# Patient Record
Sex: Female | Born: 1945
Health system: Southern US, Community
[De-identification: ages and names within clinical notes are randomized; demographics above are authoritative.]

## PROBLEM LIST (undated history)

## (undated) DIAGNOSIS — I252 Old myocardial infarction: Secondary | ICD-10-CM

## (undated) DIAGNOSIS — K219 Gastro-esophageal reflux disease without esophagitis: Secondary | ICD-10-CM

## (undated) DIAGNOSIS — T4145XA Adverse effect of unspecified anesthetic, initial encounter: Secondary | ICD-10-CM

## (undated) DIAGNOSIS — G5600 Carpal tunnel syndrome, unspecified upper limb: Secondary | ICD-10-CM

## (undated) DIAGNOSIS — F419 Anxiety disorder, unspecified: Secondary | ICD-10-CM

## (undated) DIAGNOSIS — M25559 Pain in unspecified hip: Secondary | ICD-10-CM

## (undated) DIAGNOSIS — E785 Hyperlipidemia, unspecified: Secondary | ICD-10-CM

## (undated) DIAGNOSIS — K222 Esophageal obstruction: Secondary | ICD-10-CM

## (undated) DIAGNOSIS — M1612 Unilateral primary osteoarthritis, left hip: Secondary | ICD-10-CM

## (undated) DIAGNOSIS — L570 Actinic keratosis: Secondary | ICD-10-CM

## (undated) DIAGNOSIS — I739 Peripheral vascular disease, unspecified: Secondary | ICD-10-CM

## (undated) DIAGNOSIS — M1990 Unspecified osteoarthritis, unspecified site: Secondary | ICD-10-CM

## (undated) DIAGNOSIS — M545 Low back pain, unspecified: Secondary | ICD-10-CM

## (undated) DIAGNOSIS — R079 Chest pain, unspecified: Secondary | ICD-10-CM

## (undated) DIAGNOSIS — K5792 Diverticulitis of intestine, part unspecified, without perforation or abscess without bleeding: Secondary | ICD-10-CM

## (undated) DIAGNOSIS — R011 Cardiac murmur, unspecified: Secondary | ICD-10-CM

## (undated) DIAGNOSIS — I10 Essential (primary) hypertension: Secondary | ICD-10-CM

## (undated) DIAGNOSIS — G5701 Lesion of sciatic nerve, right lower limb: Secondary | ICD-10-CM

## (undated) DIAGNOSIS — G8929 Other chronic pain: Secondary | ICD-10-CM

## (undated) DIAGNOSIS — K449 Diaphragmatic hernia without obstruction or gangrene: Secondary | ICD-10-CM

## (undated) DIAGNOSIS — R5383 Other fatigue: Secondary | ICD-10-CM

## (undated) DIAGNOSIS — T8859XA Other complications of anesthesia, initial encounter: Secondary | ICD-10-CM

## (undated) DIAGNOSIS — M1812 Unilateral primary osteoarthritis of first carpometacarpal joint, left hand: Secondary | ICD-10-CM

## (undated) HISTORY — PX: APPENDECTOMY: SHX54

## (undated) HISTORY — DX: Cardiac murmur, unspecified: R01.1

## (undated) HISTORY — DX: Hyperlipidemia, unspecified: E78.5

## (undated) HISTORY — DX: Unilateral primary osteoarthritis of first carpometacarpal joint, left hand: M18.12

## (undated) HISTORY — DX: Actinic keratosis: L57.0

## (undated) HISTORY — DX: Carpal tunnel syndrome, unspecified upper limb: G56.00

## (undated) HISTORY — DX: Pain in unspecified hip: M25.559

## (undated) HISTORY — DX: Diaphragmatic hernia without obstruction or gangrene: K44.9

## (undated) HISTORY — PX: BREAST EXCISIONAL BIOPSY: SUR124

## (undated) HISTORY — DX: Lesion of sciatic nerve, right lower limb: G57.01

## (undated) HISTORY — DX: Low back pain: M54.5

## (undated) HISTORY — DX: Essential (primary) hypertension: I10

## (undated) HISTORY — DX: Unilateral primary osteoarthritis, left hip: M16.12

## (undated) HISTORY — DX: Chest pain, unspecified: R07.9

## (undated) HISTORY — PX: JOINT REPLACEMENT: SHX530

## (undated) HISTORY — DX: Other fatigue: R53.83

## (undated) HISTORY — PX: OTHER SURGICAL HISTORY: SHX169

## (undated) HISTORY — DX: Unspecified osteoarthritis, unspecified site: M19.90

## (undated) HISTORY — DX: Other chronic pain: G89.29

## (undated) HISTORY — DX: Diverticulitis of intestine, part unspecified, without perforation or abscess without bleeding: K57.92

## (undated) HISTORY — DX: Low back pain, unspecified: M54.50

---

## 1980-04-06 HISTORY — PX: ABDOMINAL HYSTERECTOMY: SHX81

## 1990-05-16 HISTORY — PX: ESOPHAGOGASTRODUODENOSCOPY: SHX1529

## 1998-02-04 ENCOUNTER — Ambulatory Visit (HOSPITAL_COMMUNITY): Admission: RE | Admit: 1998-02-04 | Discharge: 1998-02-04 | Payer: Self-pay | Admitting: Gynecology

## 1998-02-05 ENCOUNTER — Ambulatory Visit (HOSPITAL_COMMUNITY): Admission: RE | Admit: 1998-02-05 | Discharge: 1998-02-05 | Payer: Self-pay | Admitting: Gynecology

## 1998-10-31 HISTORY — PX: CARDIOVASCULAR STRESS TEST: SHX262

## 1999-07-31 ENCOUNTER — Ambulatory Visit (HOSPITAL_COMMUNITY): Admission: RE | Admit: 1999-07-31 | Discharge: 1999-07-31 | Payer: Self-pay | Admitting: Gynecology

## 1999-07-31 ENCOUNTER — Encounter: Payer: Self-pay | Admitting: Gynecology

## 1999-08-14 ENCOUNTER — Other Ambulatory Visit: Admission: RE | Admit: 1999-08-14 | Discharge: 1999-08-14 | Payer: Self-pay | Admitting: Gynecology

## 2000-07-29 ENCOUNTER — Encounter: Payer: Self-pay | Admitting: Gynecology

## 2000-07-29 ENCOUNTER — Encounter: Admission: RE | Admit: 2000-07-29 | Discharge: 2000-07-29 | Payer: Self-pay | Admitting: Gynecology

## 2002-08-10 ENCOUNTER — Encounter: Payer: Self-pay | Admitting: Gynecology

## 2002-08-10 ENCOUNTER — Encounter: Admission: RE | Admit: 2002-08-10 | Discharge: 2002-08-10 | Payer: Self-pay | Admitting: Gynecology

## 2003-05-15 ENCOUNTER — Other Ambulatory Visit: Admission: RE | Admit: 2003-05-15 | Discharge: 2003-05-15 | Payer: Self-pay | Admitting: Gynecology

## 2004-05-15 ENCOUNTER — Encounter: Admission: RE | Admit: 2004-05-15 | Discharge: 2004-05-15 | Payer: Self-pay | Admitting: Gynecology

## 2005-11-18 ENCOUNTER — Encounter: Admission: RE | Admit: 2005-11-18 | Discharge: 2005-11-18 | Payer: Self-pay | Admitting: Gynecology

## 2005-12-01 ENCOUNTER — Encounter: Admission: RE | Admit: 2005-12-01 | Discharge: 2005-12-01 | Payer: Self-pay | Admitting: Gynecology

## 2006-08-11 HISTORY — PX: CARDIOVASCULAR STRESS TEST: SHX262

## 2007-01-11 ENCOUNTER — Ambulatory Visit: Payer: Self-pay

## 2007-03-10 ENCOUNTER — Ambulatory Visit (HOSPITAL_COMMUNITY): Admission: RE | Admit: 2007-03-10 | Discharge: 2007-03-10 | Payer: Self-pay | Admitting: Neurosurgery

## 2007-06-16 ENCOUNTER — Encounter: Admission: RE | Admit: 2007-06-16 | Discharge: 2007-06-16 | Payer: Self-pay | Admitting: Cardiology

## 2007-06-28 ENCOUNTER — Encounter: Admission: RE | Admit: 2007-06-28 | Discharge: 2007-06-28 | Payer: Self-pay | Admitting: Gynecology

## 2007-10-05 ENCOUNTER — Ambulatory Visit (HOSPITAL_COMMUNITY): Admission: RE | Admit: 2007-10-05 | Discharge: 2007-10-05 | Payer: Self-pay | Admitting: Neurosurgery

## 2009-02-06 ENCOUNTER — Encounter: Admission: RE | Admit: 2009-02-06 | Discharge: 2009-02-06 | Payer: Self-pay | Admitting: Cardiology

## 2009-02-19 ENCOUNTER — Encounter: Admission: RE | Admit: 2009-02-19 | Discharge: 2009-02-19 | Payer: Self-pay | Admitting: Gynecology

## 2010-06-12 ENCOUNTER — Ambulatory Visit: Payer: Self-pay

## 2010-08-19 NOTE — Op Note (Signed)
NAME:  Tamara Fleming, Tamara Fleming              ACCOUNT NO.:  [id number]   MEDICAL RECORD NO.:  [id number]          PATIENT TYPE:  AMB   LOCATION:  SDS                          FACILITY:  [facility]   PHYSICIAN:  Danae Orleans. Venetia Maxon, M.D.  DATE OF BIRTH:  Sep 21, 1945   DATE OF PROCEDURE:  03/10/2007  DATE OF DISCHARGE:                               OPERATIVE REPORT   PREOPERATIVE DIAGNOSIS:  Left ulnar neuropathy.   POSTOPERATIVE DIAGNOSIS:  Left ulnar neuropathy.   PROCEDURE:  Left ulnar nerve decompression.   SURGEON:  Danae Orleans. Venetia Maxon, M.D.   ASSISTANT:  Georgiann Cocker, RN   ANESTHESIA:  General LMA.   FINDINGS:  Band of compressive scar tissue proximal to the medial  epicondyle was compressing the ulnar nerve and there was decreased  conduction distal and more robust conduction proximally.   ESTIMATED BLOOD LOSS:  Minimal.   COMPLICATIONS:  None.   DISPOSITION:  Recovery.   INDICATIONS:  Tamara Fleming is a 65 year old woman who has had profound  left ulnar neuropathies.  She had an EMG nerve conduction velocities  which demonstrated conduction block just proximal to the elbow.  It was  elected to take her surgery for ulnar nerve decompression.   PROCEDURE:  Tamara Fleming was brought to the operating room.  Following  satisfactory uncomplicated induction of general laryngeal mask LMA  anesthesia the patient was placed in supine position on the operating  table.  Her left hand, forearm and arm were then prepped with Betadine  scrub and paint and sterile stockinette.  The medial epicondyle on the  left was identified.  A lazy omega incision was planned centered on the  epicondyle.  The skin was infiltrated and subcutaneous tissues were  infiltrated 1% local lidocaine.  Incision was carried through  subcutaneous fat to the level of the medial condyle and using sharp  dissection under loupe magnification with scissors.  The left ulnar  nerve was identified.  This was then freed distally and  proximally.  There was a band of scar tissue overlying the ulnar nerve just proximal  to the medial epicondyle.  Using nerve stimulation there was robust  stimulation proximal to this band of tissue and significantly diminished  but still intact stimulation distal to the band of tissue.  Band tissue  was cut and the ulnar nerve was decompressed as it emerged from the  intermuscular septum all the way to the two heads of the flexor carpi  ulnaris where small area of muscle was incised to further decompress the  nerve.  Hemostasis assured with bipolar electrocautery and the wound was  irrigated with bacitracin saline.  With flexion and extension of the  elbow there did not appear to be subluxing of the ulnar nerve.  The soft  tissue then loosely reapproximated deep and then the subcutaneous  tissues were approximated with 2-0 Vicryl interrupted inverted sutures  and skin edges were approximated interrupted 3-0 Vicryl subcu stitch.  The wound was dressed with  Benzoin, Steri-Strips, Telfa gauze, gauze fluff Kerlix wrap.  The  patient was extubated in the operating room, taken to recovery room  in  stable satisfactory condition having tolerated the procedure well, all  counts correct at end the case.      Danae Orleans. Venetia Maxon, M.D.  Electronically Signed     JDS/MEDQ  D:  03/10/2007  T:  03/10/2007  Job:  [id number]

## 2010-08-29 ENCOUNTER — Other Ambulatory Visit: Payer: Self-pay | Admitting: Cardiology

## 2010-08-29 DIAGNOSIS — I10 Essential (primary) hypertension: Secondary | ICD-10-CM

## 2010-08-29 DIAGNOSIS — E785 Hyperlipidemia, unspecified: Secondary | ICD-10-CM

## 2010-08-29 DIAGNOSIS — G4700 Insomnia, unspecified: Secondary | ICD-10-CM

## 2010-08-29 MED ORDER — ATORVASTATIN CALCIUM 10 MG PO TABS: 10.0000 mg | ORAL_TABLET | Freq: Every day | ORAL | Status: DC

## 2010-08-29 MED ORDER — VALSARTAN-HYDROCHLOROTHIAZIDE 80-12.5 MG PO TABS: 1.0000 | ORAL_TABLET | Freq: Every day | ORAL | Status: DC

## 2010-08-29 MED ORDER — ALPRAZOLAM 0.25 MG PO TABS: 0.2500 mg | ORAL_TABLET | Freq: Every evening | ORAL | Status: AC | PRN

## 2010-08-29 NOTE — Telephone Encounter (Signed)
Wanted to know if she can get refills on her meds. She called today to make her 41mo f/u appt but could not get in until 10/20/10. She will be running out of her meds.before then.

## 2010-08-29 NOTE — Telephone Encounter (Signed)
Requested rx, called in

## 2010-10-13 ENCOUNTER — Encounter: Payer: Self-pay | Admitting: Cardiology

## 2010-10-20 ENCOUNTER — Encounter: Payer: Self-pay | Admitting: Cardiology

## 2010-10-20 ENCOUNTER — Ambulatory Visit (INDEPENDENT_AMBULATORY_CARE_PROVIDER_SITE_OTHER): Payer: BC Managed Care – PPO | Admitting: Cardiology

## 2010-10-20 DIAGNOSIS — R079 Chest pain, unspecified: Secondary | ICD-10-CM

## 2010-10-20 DIAGNOSIS — R52 Pain, unspecified: Secondary | ICD-10-CM

## 2010-10-20 DIAGNOSIS — R5381 Other malaise: Secondary | ICD-10-CM

## 2010-10-20 DIAGNOSIS — E785 Hyperlipidemia, unspecified: Secondary | ICD-10-CM

## 2010-10-20 DIAGNOSIS — R5383 Other fatigue: Secondary | ICD-10-CM

## 2010-10-20 DIAGNOSIS — M1990 Unspecified osteoarthritis, unspecified site: Secondary | ICD-10-CM

## 2010-10-20 DIAGNOSIS — E7800 Pure hypercholesterolemia, unspecified: Secondary | ICD-10-CM

## 2010-10-20 DIAGNOSIS — M25552 Pain in left hip: Secondary | ICD-10-CM | POA: Insufficient documentation

## 2010-10-20 HISTORY — DX: Chest pain, unspecified: R07.9

## 2010-10-20 MED ORDER — HYDROCODONE-ACETAMINOPHEN 5-500 MG PO TABS: 1.0000 | ORAL_TABLET | Freq: Four times a day (QID) | ORAL | Status: DC | PRN

## 2010-10-20 NOTE — Assessment & Plan Note (Addendum)
The patient has had osteoarthritis and bursitis in her left hip.  She has required several injections of steroids into the hip.  She attributes some of her recent weight gain to the fact that she has had steroid shots.Her weight is up 8 pounds since we last saw her a year ago

## 2010-10-20 NOTE — Assessment & Plan Note (Signed)
The patient has a history of atypical chest pain.  Last nuclear stress test was 06/23/07 which time she exercised for 9 minutes and there was nothing to suggest ischemia by standard criteria and her ejection fraction was 75%.  Her last echocardiogram was in 2008 and at that time showed normal left ventricular systolic and diastolic function and no significant valve abnormalities.

## 2010-10-20 NOTE — Assessment & Plan Note (Signed)
The patient has a history of dyslipidemia.  We had not checked her blood work in more than a year.  She is concerned that with her weight gain her lipids may be worse.  We will check him today.  She remains on low dose Lipitor.

## 2010-10-20 NOTE — Assessment & Plan Note (Signed)
The patient has been experiencing fatigue.  She complains of lack of energy.  She has not had any evidence of hematochezia or melena.  She wonders if she might be having some menopausal symptoms since she is no longer on estrogen replacement therapy.  She does not have any history of thyroid disorder and we are checking blood work today

## 2010-10-20 NOTE — Progress Notes (Signed)
Tamara Fleming Date of Birth:  [birth year] [facility] [address]   [address] [place], [place]  [address] [phone]           Fax   [phone]  History of Present Illness: This 65 year old woman is seen for a one-year followup office visit.  She has a past history of atypical chest pain and history of dyslipidemia.  Her family history is positive for coronary artery disease.  She had a Cardiolite stress test in 06/23/07 that was normal and she had an echocardiogram on5/07/08 which showed normal left ventricular systolic and diastolic function and no valvular abnormalities.  Since last visit she's had a pan week HEENT.  She attributes some of this to the side effects of steroids and she has received 4% as of her left hip.  She's also concerned about possible hormonal issues.  She is postmenopausal and had taken estrogen replacement therapy for 9 years but is not any longer on estrogen replacement therapy.  She will discuss her concerns with her gynecologist.  Recently she's had some shortness of breath and some atypical chest pressure.  She complains of extreme fatigue and doesn't have much energy  Current Outpatient Prescriptions  Medication Sig Dispense Refill  . ALPRAZolam (XANAX) 0.25 MG tablet Take 0.25 mg by mouth at bedtime as needed.        Marland Kitchen aspirin 81 MG tablet Take 81 mg by mouth every other day.        Marland Kitchen atorvastatin (LIPITOR) 10 MG tablet Take 1 tablet (10 mg total) by mouth daily.  90 tablet  1  . HYDROcodone-acetaminophen (VICODIN) 5-500 MG per tablet Take 1 tablet by mouth every 6 (six) hours as needed.  90 tablet  3  . valsartan-hydrochlorothiazide (DIOVAN HCT) 80-12.5 MG per tablet Take 1 tablet by mouth daily.  90 tablet  1    Allergies  Allergen Reactions  . Floxin (Ofloxacin)     Patient Active Problem List  Diagnoses  . Chest pain  . Dyslipidemia  . Osteoarthritis  . Fatigue    History  Smoking status  . Never Smoker     Smokeless tobacco  . Not on file    History  Alcohol Use No    Family History  Problem Relation Age of Onset  . Coronary artery disease Mother     Review of Systems: Constitutional: no fever chills diaphoresis or fatigue or change in weight.  Head and neck: no hearing loss, no epistaxis, no photophobia or visual disturbance. Respiratory: No cough, shortness of breath or wheezing. Cardiovascular: No chest pain peripheral edema, palpitations. Gastrointestinal: No abdominal distention, no abdominal pain, no change in bowel habits hematochezia or melena. Genitourinary: No dysuria, no frequency, no urgency, no nocturia. Musculoskeletal:No arthralgias, no back pain, no gait disturbance or myalgias. Neurological: No dizziness, no headaches, no numbness, no seizures, no syncope, no weakness, no tremors. Hematologic: No lymphadenopathy, no easy bruising. Psychiatric: No confusion, no hallucinations, no sleep disturbance.    Physical Exam: Filed Vitals:   10/20/10 1534  BP: 130/80  Pulse: 66  The general appearance reveals a well-developed well-nourished woman in no distress.Pupils equal and reactive.   Extraocular Movements are full.  There is no scleral icterus.  The mouth and pharynx are normal.  The neck is supple.  The carotids reveal no bruits.  The jugular venous pressure is normal.  The thyroid is not enlarged.  There is no lymphadenopathy.The chest is clear to percussion and auscultation.  There are no rales or rhonchi. Expansion of the chest is symmetrical.  The heart reveals a soft systolic ejection murmur at the base.  No diastolic murmur.  No  gallop or rub.The abdomen is soft and nontender. Bowel sounds are normal. The liver and spleen are not enlarged. There Are no abdominal masses. There are no bruits.The pedal pulses are good.  There is no phlebitis or edema.  There is no cyanosis or clubbing.Strength is normal and symmetrical in all extremities.  There is no lateralizing  weakness.  There are no sensory deficits.The skin is warm and dry.  There is no rash.   Assessment / Plan:  We are checking lab work today to evaluate her lipids as well as her complaints of fatigue and malaise.  We'll also update her echocardiogram.  Her systolic murmur maybe a little more prominent than in the past.  Recheck in 6 months for followup office visit And fasting lab work

## 2010-10-21 LAB — HEPATIC FUNCTION PANEL
ALT: 34 U/L (ref 0–35)
Albumin: 4.6 g/dL (ref 3.5–5.2)
Alkaline Phosphatase: 79 U/L (ref 39–117)
Bilirubin, Direct: 0.1 mg/dL (ref 0.0–0.3)
Total Protein: 7.4 g/dL (ref 6.0–8.3)

## 2010-10-21 LAB — LIPID PANEL
Cholesterol: 154 mg/dL (ref 0–200)
LDL Cholesterol: 81 mg/dL (ref 0–99)
Triglycerides: 103 mg/dL (ref 0.0–149.0)
VLDL: 20.6 mg/dL (ref 0.0–40.0)

## 2010-10-21 LAB — CBC WITH DIFFERENTIAL/PLATELET
Basophils Absolute: 0 10*3/uL (ref 0.0–0.1)
Eosinophils Relative: 1.7 % (ref 0.0–5.0)
HCT: 37.7 % (ref 36.0–46.0)
Lymphs Abs: 2.4 10*3/uL (ref 0.7–4.0)
Monocytes Absolute: 0.8 10*3/uL (ref 0.1–1.0)
Monocytes Relative: 10 % (ref 3.0–12.0)
Neutrophils Relative %: 55.7 % (ref 43.0–77.0)
Platelets: 213 10*3/uL (ref 150.0–400.0)
RDW: 13.1 % (ref 11.5–14.6)
WBC: 7.5 10*3/uL (ref 4.5–10.5)

## 2010-10-21 LAB — BASIC METABOLIC PANEL
BUN: 10 mg/dL (ref 6–23)
CO2: 29 mEq/L (ref 19–32)
Glucose, Bld: 111 mg/dL — ABNORMAL HIGH (ref 70–99)
Potassium: 4.6 mEq/L (ref 3.5–5.1)
Sodium: 140 mEq/L (ref 135–145)

## 2010-10-22 ENCOUNTER — Encounter: Payer: Self-pay | Admitting: *Deleted

## 2010-10-23 ENCOUNTER — Telehealth: Payer: Self-pay | Admitting: *Deleted

## 2010-10-23 NOTE — Telephone Encounter (Signed)
Advised patient of lab results  

## 2010-10-28 ENCOUNTER — Other Ambulatory Visit (HOSPITAL_COMMUNITY): Payer: BC Managed Care – PPO | Admitting: Radiology

## 2010-11-04 ENCOUNTER — Ambulatory Visit (HOSPITAL_COMMUNITY): Payer: BC Managed Care – PPO | Attending: Cardiology | Admitting: Radiology

## 2010-11-04 DIAGNOSIS — R5381 Other malaise: Secondary | ICD-10-CM | POA: Insufficient documentation

## 2010-11-04 DIAGNOSIS — R011 Cardiac murmur, unspecified: Secondary | ICD-10-CM

## 2010-11-04 DIAGNOSIS — E785 Hyperlipidemia, unspecified: Secondary | ICD-10-CM | POA: Insufficient documentation

## 2010-11-04 DIAGNOSIS — R072 Precordial pain: Secondary | ICD-10-CM

## 2010-11-04 DIAGNOSIS — Z8249 Family history of ischemic heart disease and other diseases of the circulatory system: Secondary | ICD-10-CM | POA: Insufficient documentation

## 2010-11-04 DIAGNOSIS — R5383 Other fatigue: Secondary | ICD-10-CM

## 2010-11-06 ENCOUNTER — Telehealth: Payer: Self-pay | Admitting: *Deleted

## 2010-11-06 NOTE — Telephone Encounter (Signed)
Message copied by Eugenia Pancoast on Thu Nov 06, 2010  2:49 PM ------      Message from: Cassell Clement      Created: Wed Nov 05, 2010  3:34 PM       Please report.  The echocardiogram was very good.  No significant valve problem was found.  There is a trivial leak of her aortic valve but no aortic stenosis.  Continue on same medication and continue with efforts to keep cholesterol down

## 2010-11-07 ENCOUNTER — Ambulatory Visit: Payer: Self-pay | Admitting: Family Medicine

## 2010-11-09 ENCOUNTER — Inpatient Hospital Stay: Payer: Self-pay | Admitting: Surgery

## 2010-12-13 ENCOUNTER — Other Ambulatory Visit: Payer: Self-pay | Admitting: Cardiology

## 2010-12-18 ENCOUNTER — Ambulatory Visit: Payer: Self-pay | Admitting: Surgery

## 2011-01-12 LAB — BASIC METABOLIC PANEL
BUN: 7
Calcium: 9.6
Creatinine, Ser: 0.71
GFR calc non Af Amer: >60
Glucose, Bld: 106 — ABNORMAL HIGH

## 2011-01-12 LAB — CBC
HCT: 37.4
Platelets: 247
RDW: 12.8
WBC: 7

## 2011-01-15 ENCOUNTER — Other Ambulatory Visit: Payer: Self-pay | Admitting: Cardiology

## 2011-01-15 DIAGNOSIS — M549 Dorsalgia, unspecified: Secondary | ICD-10-CM

## 2011-02-06 ENCOUNTER — Other Ambulatory Visit: Payer: Self-pay | Admitting: Cardiology

## 2011-02-06 NOTE — Telephone Encounter (Signed)
Refilled lipitor 

## 2011-03-06 ENCOUNTER — Other Ambulatory Visit: Payer: Self-pay | Admitting: Cardiology

## 2011-03-06 ENCOUNTER — Telehealth: Payer: Self-pay | Admitting: Cardiology

## 2011-03-06 DIAGNOSIS — I119 Hypertensive heart disease without heart failure: Secondary | ICD-10-CM

## 2011-03-06 DIAGNOSIS — M549 Dorsalgia, unspecified: Secondary | ICD-10-CM

## 2011-03-06 NOTE — Telephone Encounter (Signed)
Left message

## 2011-03-06 NOTE — Telephone Encounter (Signed)
New message:  Pt wants to know if there is something else she can take in place of Diovan that would be a generic because of insurance. Please call her back at [phone] or [phone]

## 2011-03-09 MED ORDER — VALSARTAN-HYDROCHLOROTHIAZIDE 80-12.5 MG PO TABS: 1.0000 | ORAL_TABLET | Freq: Every day | ORAL | Status: DC

## 2011-03-09 NOTE — Telephone Encounter (Signed)
Advised available in generic and called to pharmacy

## 2011-03-09 NOTE — Telephone Encounter (Signed)
FU Call: Pt returning call from Melinda. Please call back.  

## 2011-03-10 NOTE — Telephone Encounter (Signed)
Called pharmacy and no refill needed at this time per pharmacy

## 2011-04-15 ENCOUNTER — Other Ambulatory Visit: Payer: Self-pay | Admitting: Gynecology

## 2011-04-15 DIAGNOSIS — Z1231 Encounter for screening mammogram for malignant neoplasm of breast: Secondary | ICD-10-CM

## 2011-04-22 ENCOUNTER — Ambulatory Visit (INDEPENDENT_AMBULATORY_CARE_PROVIDER_SITE_OTHER): Payer: BC Managed Care – PPO | Admitting: Cardiology

## 2011-04-22 ENCOUNTER — Encounter: Payer: Self-pay | Admitting: Cardiology

## 2011-04-22 VITALS — BP 120/78 | HR 60 | Ht 65.0 in | Wt 173.0 lb

## 2011-04-22 DIAGNOSIS — E7800 Pure hypercholesterolemia, unspecified: Secondary | ICD-10-CM

## 2011-04-22 DIAGNOSIS — F411 Generalized anxiety disorder: Secondary | ICD-10-CM

## 2011-04-22 DIAGNOSIS — E785 Hyperlipidemia, unspecified: Secondary | ICD-10-CM

## 2011-04-22 DIAGNOSIS — R5383 Other fatigue: Secondary | ICD-10-CM

## 2011-04-22 DIAGNOSIS — Z1231 Encounter for screening mammogram for malignant neoplasm of breast: Secondary | ICD-10-CM

## 2011-04-22 DIAGNOSIS — R079 Chest pain, unspecified: Secondary | ICD-10-CM

## 2011-04-22 DIAGNOSIS — I119 Hypertensive heart disease without heart failure: Secondary | ICD-10-CM

## 2011-04-22 DIAGNOSIS — F419 Anxiety disorder, unspecified: Secondary | ICD-10-CM

## 2011-04-22 LAB — HEPATIC FUNCTION PANEL
ALT: 31 U/L (ref 0–35)
Alkaline Phosphatase: 68 U/L (ref 39–117)
Bilirubin, Direct: 0.1 mg/dL (ref 0.0–0.3)
Total Bilirubin: 0.9 mg/dL (ref 0.3–1.2)

## 2011-04-22 LAB — LDL CHOLESTEROL, DIRECT: Direct LDL: 125 mg/dL

## 2011-04-22 LAB — BASIC METABOLIC PANEL
Chloride: 103 mEq/L (ref 96–112)
Creatinine, Ser: 0.8 mg/dL (ref 0.4–1.2)
Potassium: 3.3 mEq/L — ABNORMAL LOW (ref 3.5–5.1)
Sodium: 142 mEq/L (ref 135–145)

## 2011-04-22 LAB — LIPID PANEL
Total CHOL/HDL Ratio: 3
Triglycerides: 122 mg/dL (ref 0.0–149.0)

## 2011-04-22 MED ORDER — ALPRAZOLAM 0.25 MG PO TABS: 0.2500 mg | ORAL_TABLET | Freq: Every evening | ORAL | Status: DC | PRN

## 2011-04-22 MED ORDER — VALSARTAN-HYDROCHLOROTHIAZIDE 80-12.5 MG PO TABS: 1.0000 | ORAL_TABLET | Freq: Every day | ORAL | Status: DC

## 2011-04-22 MED ORDER — ATORVASTATIN CALCIUM 10 MG PO TABS: 10.0000 mg | ORAL_TABLET | Freq: Every day | ORAL | Status: DC

## 2011-04-22 NOTE — Progress Notes (Signed)
Tamara Fleming Date of Birth:  [birth year] [facility] [address] [place], [place]  [address] [phone]  Fax   [phone]  HPI: This pleasant 66 year old woman is seen back for a scheduled followup office visit.  She has a history of hypercholesterolemia and atypical chest pain.  Since the last saw her she was hospitalized in [place] for a week with severe diverticulitis.  She has not had any recent cardiac symptoms.  She does note her heart racing at night intermittently.  Current Outpatient Prescriptions  Medication Sig Dispense Refill  . ALPRAZolam (XANAX) 0.25 MG tablet Take 0.25 mg by mouth at bedtime as needed.        Marland Kitchen aspirin 81 MG tablet Take 81 mg by mouth daily.       Marland Kitchen atorvastatin (LIPITOR) 10 MG tablet TAKE 1 TABLET ONCE DAILY FOR CHOLESTEROL  90 tablet  3  . HYDROcodone-acetaminophen (VICODIN) 5-500 MG per tablet TAKE ONE TABLET BY MOUTH EVERY SIX HOURSAS NEEDED FOR PAIN.  90 tablet  0  . valsartan-hydrochlorothiazide (DIOVAN HCT) 80-12.5 MG per tablet Take 1 tablet by mouth daily.  90 tablet  3    Allergies  Allergen Reactions  . Ciprofloxacin   . Floxin (Ofloxacin)     Patient Active Problem List  Diagnoses  . Chest pain  . Dyslipidemia  . Osteoarthritis  . Fatigue    History  Smoking status  . Never Smoker   Smokeless tobacco  . Not on file    History  Alcohol Use No    Family History  Problem Relation Age of Onset  . Coronary artery disease Mother     Review of Systems: The patient denies any heat or cold intolerance.  No weight gain or weight loss.  The patient denies headaches or blurry vision.  There is no cough or sputum production.  The patient denies dizziness.  There is no hematuria or hematochezia.  The patient denies any muscle aches or arthritis.  The patient denies any rash.  The patient denies frequent falling or instability.  There is no history of depression or anxiety.  All other systems were  reviewed and are negative.   Physical Exam: Filed Vitals:   04/22/11 1034  BP: 120/78  Pulse: 60   the general appearance reveals a well-developed well-nourished Caucasian female in no distress.The head and neck exam reveals pupils equal and reactive.  Extraocular movements are full.  There is no scleral icterus.  The mouth and pharynx are normal.  The neck is supple.  The carotids reveal no bruits.  The jugular venous pressure is normal.  The  thyroid is not enlarged.  There is no lymphadenopathy.  The chest is clear to percussion and auscultation.  There are no rales or rhonchi.  Expansion of the chest is symmetrical.  The precordium is quiet.  The first heart sound is normal.  The second heart sound is physiologically split.  There is no murmur gallop rub or click.  There is no abnormal lift or heave.  The abdomen is soft and nontender.  The bowel sounds are normal.  The liver and spleen are not enlarged.  There are no abdominal masses.  There are no abdominal bruits.  Extremities reveal good pedal pulses.  There is no phlebitis or edema.  There is no cyanosis or clubbing.  Strength is normal and symmetrical in all extremities.  There is no lateralizing weakness.  There are no sensory deficits.  The skin is warm  and dry.  There is no rash.      Assessment / Plan: Continue present medication.  Await results of today's labs.  We refilled her medication.  Recheck in 6 months for followup office visit EKG and fasting labs

## 2011-04-22 NOTE — Assessment & Plan Note (Signed)
The patient does have a history of chronic fatigue.  She is looking forward to a planned cruise to the [place] in several weeks to get her strength back.

## 2011-04-22 NOTE — Patient Instructions (Signed)
Will obtain labs today and call you with the results  Your physician recommends that you continue on your current medications as directed. Please refer to the Current Medication list given to you today.  Your physician wants you to follow-up in: 6 month You will receive a reminder letter in the mail two months in advance. If you don't receive a letter, please call our office to schedule the follow-up appointment.  

## 2011-04-22 NOTE — Assessment & Plan Note (Signed)
Patient has a history of dyslipidemia.  She has been on low-dose Lipitor without side effects.  Her blood work today is pending.  His last visit she has been exercising more and her weight is down 11 pounds which should help her lipid status

## 2011-04-22 NOTE — Assessment & Plan Note (Signed)
Since last visit the patient has not had any severe or prolonged chest pain.

## 2011-05-06 ENCOUNTER — Ambulatory Visit
Admission: RE | Admit: 2011-05-06 | Discharge: 2011-05-06 | Disposition: A | Discharge status: Home / Self Care | Payer: BC Managed Care – PPO | Source: Ambulatory Visit | Attending: Gynecology | Admitting: Gynecology

## 2011-05-07 ENCOUNTER — Telehealth: Payer: Self-pay | Admitting: *Deleted

## 2011-05-07 DIAGNOSIS — E876 Hypokalemia: Secondary | ICD-10-CM

## 2011-05-07 DIAGNOSIS — G723 Periodic paralysis: Secondary | ICD-10-CM

## 2011-05-07 MED ORDER — POTASSIUM CHLORIDE CRYS ER 20 MEQ PO TBCR: 20.0000 meq | EXTENDED_RELEASE_TABLET | Freq: Every day | ORAL | Status: DC | PRN

## 2011-05-07 NOTE — Telephone Encounter (Signed)
Advised of labs and new medication  

## 2011-05-07 NOTE — Telephone Encounter (Signed)
Message copied by Burnell Blanks on Thu May 07, 2011  5:31 PM ------      Message from: Cassell Clement      Created: Thu Apr 23, 2011  7:38 AM       Please report.  The labs are stable.    Continue careful diet.  The K is low  So add KDUR 20 one tab daily.  LDL higher so watch diet carefully.

## 2011-05-08 ENCOUNTER — Other Ambulatory Visit: Payer: Self-pay | Admitting: Cardiology

## 2011-05-08 DIAGNOSIS — E876 Hypokalemia: Secondary | ICD-10-CM

## 2011-05-08 MED ORDER — POTASSIUM CHLORIDE CRYS ER 20 MEQ PO TBCR: 20.0000 meq | EXTENDED_RELEASE_TABLET | Freq: Every day | ORAL | Status: DC | PRN

## 2011-05-08 NOTE — Telephone Encounter (Signed)
New Refill  Ed Pharmacist, [facility] [phone] Fax [phone]  Calling for patient refill on potassium as he did not receive anything for patient yesterday

## 2011-05-08 NOTE — Telephone Encounter (Signed)
Refilled potassium spoke with Tresa Endo at [facility]

## 2011-06-04 ENCOUNTER — Telehealth: Payer: Self-pay | Admitting: Cardiology

## 2011-06-04 NOTE — Telephone Encounter (Signed)
Pt needs refill vicodin, [facility] pt [phone]

## 2011-06-08 NOTE — Telephone Encounter (Signed)
We have asked her to get this from her primary care physician

## 2011-06-09 NOTE — Telephone Encounter (Signed)
Spoke with patient and she rarely sees her PCP and wanted to know if  Dr. Patty Sermons  could fill this time until she gets worked out with PCP.  Will forward to  Dr. Patty Sermons for review

## 2011-06-09 NOTE — Telephone Encounter (Signed)
Okay to fill it this time.

## 2011-06-10 ENCOUNTER — Other Ambulatory Visit: Payer: Self-pay | Admitting: *Deleted

## 2011-06-10 DIAGNOSIS — M549 Dorsalgia, unspecified: Secondary | ICD-10-CM

## 2011-06-10 DIAGNOSIS — E876 Hypokalemia: Secondary | ICD-10-CM

## 2011-06-10 MED ORDER — HYDROCODONE-ACETAMINOPHEN 5-500 MG PO TABS: 1.0000 | ORAL_TABLET | Freq: Four times a day (QID) | ORAL | Status: DC | PRN

## 2011-06-10 NOTE — Telephone Encounter (Signed)
Advised patient

## 2011-06-10 NOTE — Telephone Encounter (Signed)
In regard to the potassium pills, she can try to increase her potassium by ingesting potassium rich foods. In regard to her leg complaint I would suggest that she go to an urgent care if she does not have a family doctor.

## 2011-06-10 NOTE — Telephone Encounter (Signed)
When called patient about Rx, she stated she couldn't take her potassium that had been Rx'd secondary to not being able to swallow.  She did try to cut in half and this didn't work.  She also was c/o place on her leg near her shin. The place is sore to touch and burns. It has been going on for some time, but place is now numb feeling.  Will forward to tab for review

## 2011-07-01 ENCOUNTER — Ambulatory Visit (INDEPENDENT_AMBULATORY_CARE_PROVIDER_SITE_OTHER): Payer: Medicare Other | Admitting: *Deleted

## 2011-07-01 DIAGNOSIS — E785 Hyperlipidemia, unspecified: Secondary | ICD-10-CM | POA: Diagnosis not present

## 2011-07-01 DIAGNOSIS — E876 Hypokalemia: Secondary | ICD-10-CM | POA: Diagnosis not present

## 2011-07-01 DIAGNOSIS — M1990 Unspecified osteoarthritis, unspecified site: Secondary | ICD-10-CM

## 2011-07-01 DIAGNOSIS — R079 Chest pain, unspecified: Secondary | ICD-10-CM

## 2011-07-01 LAB — URIC ACID: Uric Acid, Serum: 9.1 mg/dL — ABNORMAL HIGH (ref 2.4–7.0)

## 2011-07-01 LAB — BASIC METABOLIC PANEL
Calcium: 9.3 mg/dL (ref 8.4–10.5)
GFR: 86.38 mL/min (ref >60.00)
Potassium: 3.7 mEq/L (ref 3.5–5.1)
Sodium: 137 mEq/L (ref 135–145)

## 2011-07-01 NOTE — Progress Notes (Signed)
Quick Note:  Please report to patient. The recent labs are stable. Continue same medication and careful diet. Potassium is normal now. Uric acid is high. Has she been having any symptoms of gout? ______

## 2011-07-10 ENCOUNTER — Telehealth: Payer: Self-pay | Admitting: Cardiology

## 2011-07-10 NOTE — Telephone Encounter (Signed)
New [place]:     Patient called in wanting to know the results of her latest blood test.  Please call back.

## 2011-07-10 NOTE — Telephone Encounter (Signed)
Patient aware of lab results.

## 2011-08-11 DIAGNOSIS — J029 Acute pharyngitis, unspecified: Secondary | ICD-10-CM | POA: Diagnosis not present

## 2011-08-27 ENCOUNTER — Other Ambulatory Visit: Payer: Self-pay | Admitting: Cardiology

## 2011-08-27 DIAGNOSIS — R5383 Other fatigue: Secondary | ICD-10-CM

## 2011-08-27 DIAGNOSIS — F419 Anxiety disorder, unspecified: Secondary | ICD-10-CM

## 2011-08-27 DIAGNOSIS — M549 Dorsalgia, unspecified: Secondary | ICD-10-CM

## 2011-08-27 DIAGNOSIS — E7800 Pure hypercholesterolemia, unspecified: Secondary | ICD-10-CM

## 2011-08-27 DIAGNOSIS — E785 Hyperlipidemia, unspecified: Secondary | ICD-10-CM

## 2011-08-27 DIAGNOSIS — Z1231 Encounter for screening mammogram for malignant neoplasm of breast: Secondary | ICD-10-CM

## 2011-08-27 DIAGNOSIS — I119 Hypertensive heart disease without heart failure: Secondary | ICD-10-CM

## 2011-08-27 NOTE — Telephone Encounter (Signed)
Per pt call she needs these refills in asap she is out of meds

## 2011-08-28 ENCOUNTER — Other Ambulatory Visit: Payer: Self-pay | Admitting: *Deleted

## 2011-08-28 DIAGNOSIS — R5383 Other fatigue: Secondary | ICD-10-CM

## 2011-08-28 DIAGNOSIS — Z1231 Encounter for screening mammogram for malignant neoplasm of breast: Secondary | ICD-10-CM

## 2011-08-28 DIAGNOSIS — I119 Hypertensive heart disease without heart failure: Secondary | ICD-10-CM

## 2011-08-28 DIAGNOSIS — F419 Anxiety disorder, unspecified: Secondary | ICD-10-CM

## 2011-08-28 DIAGNOSIS — M549 Dorsalgia, unspecified: Secondary | ICD-10-CM

## 2011-08-28 DIAGNOSIS — E7800 Pure hypercholesterolemia, unspecified: Secondary | ICD-10-CM

## 2011-08-28 DIAGNOSIS — E785 Hyperlipidemia, unspecified: Secondary | ICD-10-CM

## 2011-08-31 MED ORDER — ALPRAZOLAM 0.25 MG PO TABS: 0.2500 mg | ORAL_TABLET | Freq: Every evening | ORAL | Status: DC | PRN

## 2011-08-31 MED ORDER — HYDROCODONE-ACETAMINOPHEN 5-500 MG PO TABS: 1.0000 | ORAL_TABLET | Freq: Four times a day (QID) | ORAL | Status: DC | PRN

## 2011-09-10 ENCOUNTER — Ambulatory Visit (INDEPENDENT_AMBULATORY_CARE_PROVIDER_SITE_OTHER): Payer: Medicare Other | Admitting: Cardiology

## 2011-09-10 ENCOUNTER — Encounter: Payer: Self-pay | Admitting: Cardiology

## 2011-09-10 VITALS — BP 140/82 | HR 68 | Ht 65.0 in | Wt 175.0 lb

## 2011-09-10 DIAGNOSIS — R0602 Shortness of breath: Secondary | ICD-10-CM | POA: Diagnosis not present

## 2011-09-10 DIAGNOSIS — R5381 Other malaise: Secondary | ICD-10-CM | POA: Diagnosis not present

## 2011-09-10 DIAGNOSIS — R002 Palpitations: Secondary | ICD-10-CM

## 2011-09-10 DIAGNOSIS — I119 Hypertensive heart disease without heart failure: Secondary | ICD-10-CM | POA: Diagnosis not present

## 2011-09-10 DIAGNOSIS — R5383 Other fatigue: Secondary | ICD-10-CM | POA: Diagnosis not present

## 2011-09-10 DIAGNOSIS — E7800 Pure hypercholesterolemia, unspecified: Secondary | ICD-10-CM

## 2011-09-10 DIAGNOSIS — Z1231 Encounter for screening mammogram for malignant neoplasm of breast: Secondary | ICD-10-CM

## 2011-09-10 DIAGNOSIS — F411 Generalized anxiety disorder: Secondary | ICD-10-CM

## 2011-09-10 DIAGNOSIS — F419 Anxiety disorder, unspecified: Secondary | ICD-10-CM

## 2011-09-10 DIAGNOSIS — E785 Hyperlipidemia, unspecified: Secondary | ICD-10-CM

## 2011-09-10 MED ORDER — VALSARTAN-HYDROCHLOROTHIAZIDE 160-12.5 MG PO TABS: 1.0000 | ORAL_TABLET | Freq: Every day | ORAL | Status: DC

## 2011-09-10 NOTE — Patient Instructions (Signed)
Will obtain labs today and call you with the results  Increase your Diovan HCT to 160/12.5 mg daily, Rx sent to your pharmacy  Your physician wants you to follow-up in: 6 months with fasting labs You will receive a reminder letter in the mail two months in advance. If you don't receive a letter, please call our office to schedule the follow-up appointment.

## 2011-09-10 NOTE — Assessment & Plan Note (Signed)
The patient has occasional chest pain.  This is not associated with any particular activity.  There is no radiation or pattern to her chest pain.  She does stay physically active and has plans to go to [facility] in several weeks with 3 other couples and they will play golf.

## 2011-09-10 NOTE — Assessment & Plan Note (Signed)
Patient complains of lack of energy and fatigue.  Some of this may be related to her problems during the night.  We will also check a CBC and thyroid function today.

## 2011-09-10 NOTE — Assessment & Plan Note (Signed)
The patient's blood pressure was mildly elevated today.  She has been on low dose of Diovan HCT and we will increase the dose to 160/12.5 one daily

## 2011-09-10 NOTE — Progress Notes (Signed)
Tamara Fleming Date of Birth:  1945/10/05 [facility] [address] [place], [place]  [address] [phone]         Fax   [phone]  History of Present Illness: This pleasant 66 year old woman is seen for a scheduled 6 month followup office visit.  She has a past history of hypercholesterolemia and a history of atypical chest pain.  She does not have any known ischemic heart disease.  He has had previously normal nuclear stress tests with normal LV function.  He had an echocardiogram in 08/11/06 which showed an ejection fraction of 55-60% and normal systolic and diastolic function and no significant valve abnormalities  Current Outpatient Prescriptions  Medication Sig Dispense Refill  . ALPRAZolam (XANAX) 0.25 MG tablet Take 1 tablet (0.25 mg total) by mouth at bedtime as needed.  90 tablet  0  . aspirin 81 MG tablet Take 81 mg by mouth daily.       Marland Kitchen atorvastatin (LIPITOR) 10 MG tablet Take 1 tablet (10 mg total) by mouth daily.  90 tablet  3  . HYDROcodone-acetaminophen (VICODIN) 5-500 MG per tablet Take 1 tablet by mouth every 6 (six) hours as needed for pain.  90 tablet  1  . DISCONTD: valsartan-hydrochlorothiazide (DIOVAN HCT) 80-12.5 MG per tablet Take 1 tablet by mouth daily.  90 tablet  3  . valsartan-hydrochlorothiazide (DIOVAN HCT) 160-12.5 MG per tablet Take 1 tablet by mouth daily.  90 tablet  3    Allergies  Allergen Reactions  . Ciprofloxacin   . Floxin (Ofloxacin)     Patient Active Problem List  Diagnoses  . Chest pain  . Dyslipidemia  . Osteoarthritis  . Fatigue  . Rapid palpitations    History  Smoking status  . Never Smoker   Smokeless tobacco  . Not on file    History  Alcohol Use No    Family History  Problem Relation Age of Onset  . Coronary artery disease Mother     Review of Systems: Constitutional: no fever chills diaphoresis or fatigue or change in weight.  Head and neck: no hearing loss, no epistaxis, no  photophobia or visual disturbance. Respiratory: No cough, shortness of breath or wheezing. Cardiovascular: No chest pain peripheral edema, palpitations. Gastrointestinal: No abdominal distention, no abdominal pain, no change in bowel habits hematochezia or melena. Genitourinary: No dysuria, no frequency, no urgency, no nocturia. Musculoskeletal:No arthralgias, no back pain, no gait disturbance or myalgias. Neurological: No dizziness, no headaches, no numbness, no seizures, no syncope, no weakness, no tremors. Hematologic: No lymphadenopathy, no easy bruising. Psychiatric: No confusion, no hallucinations, no sleep disturbance.    Physical Exam: Filed Vitals:   09/10/11 1455  BP: 140/82  Pulse: 68   the general appearance reveals a well-developed well-nourished middle-age woman in no distress.The head and neck exam reveals pupils equal and reactive.  Extraocular movements are full.  There is no scleral icterus.  The mouth and pharynx are normal.  The neck is supple.  The carotids reveal no bruits.  The jugular venous pressure is normal.  The  thyroid is not enlarged.  There is no lymphadenopathy.  The chest is clear to percussion and auscultation.  There are no rales or rhonchi.  Expansion of the chest is symmetrical.  The precordium is quiet.  The first heart sound is normal.  The second heart sound is physiologically split.  There is no murmur gallop rub or click.  There is no abnormal lift or heave.  The abdomen is soft and nontender.  The bowel sounds are normal.  The liver and spleen are not enlarged.  There are no abdominal masses.  There are no abdominal bruits.  Extremities reveal good pedal pulses.  There is no phlebitis or edema.  There is no cyanosis or clubbing.  Strength is normal and symmetrical in all extremities.  There is no lateralizing weakness.  There are no sensory deficits.  The skin is warm and dry.  There is no rash.  EKG shows normal sinus rhythm and minor nonspecific T-wave  flattening.   Assessment / Plan: Await results of today's lab work.  Increase Diovan HCT generic 260/12.5 one daily. Return in 6 months for followup office visit lipid panel hepatic function panel and basal metabolic panel.  Weight is up 2 pounds and I want her to  work harder on weight loss. We also talked about the fact that she needs to have a primary care provider.  She has used a family practice in [place] for this in the past.

## 2011-09-10 NOTE — Assessment & Plan Note (Signed)
The patient has been experiencing some palpitations and a sense of her heartbeat pounding.  This often occurs at night after she gets up to go to the bathroom during the night.  The pressure is slightly high today and this could be related to her present symptoms.  Her electrocardiogram today shows normal sinus rhythm with no premature beats

## 2011-09-10 NOTE — Assessment & Plan Note (Signed)
The patient has a history of dyslipidemia and is on low-dose Lipitor 10 mg daily.  We're checking lab work today.

## 2011-09-11 LAB — CBC WITH DIFFERENTIAL/PLATELET
Basophils Absolute: 0 10*3/uL (ref 0.0–0.1)
HCT: 37.7 % (ref 36.0–46.0)
Lymphocytes Relative: 35.9 % (ref 12.0–46.0)
Lymphs Abs: 2.5 10*3/uL (ref 0.7–4.0)
Monocytes Relative: 9.6 % (ref 3.0–12.0)
Platelets: 191 10*3/uL (ref 150.0–400.0)
RDW: 13 % (ref 11.5–14.6)

## 2011-09-11 LAB — HEPATIC FUNCTION PANEL
Alkaline Phosphatase: 74 U/L (ref 39–117)
Bilirubin, Direct: 0.1 mg/dL (ref 0.0–0.3)
Total Bilirubin: 0.7 mg/dL (ref 0.3–1.2)
Total Protein: 7.3 g/dL (ref 6.0–8.3)

## 2011-09-11 LAB — BASIC METABOLIC PANEL
CO2: 30 mEq/L (ref 19–32)
Calcium: 9.3 mg/dL (ref 8.4–10.5)
Sodium: 137 mEq/L (ref 135–145)

## 2011-09-11 LAB — LIPID PANEL
HDL: 56.1 mg/dL (ref >39.00)
LDL Cholesterol: 96 mg/dL (ref 0–99)
Total CHOL/HDL Ratio: 3
VLDL: 18.6 mg/dL (ref 0.0–40.0)

## 2011-09-11 LAB — TSH: TSH: 1.83 u[IU]/mL (ref 0.35–5.50)

## 2011-09-11 LAB — T4, FREE: Free T4: 0.63 ng/dL (ref 0.60–1.60)

## 2011-09-13 NOTE — Progress Notes (Signed)
Quick Note:  Please report to patient. The recent labs are stable. Continue same medication and careful diet. Thyroid and CBC normal. Two of LFTs slightly high probably fatty liver---lose weight, avoid much alcohol/wine (if she drinks) ______

## 2011-09-18 ENCOUNTER — Telehealth: Payer: Self-pay | Admitting: *Deleted

## 2011-09-18 NOTE — Telephone Encounter (Signed)
Mailed copy of labs and left message to call if any questions  

## 2011-09-18 NOTE — Telephone Encounter (Signed)
Message copied by Burnell Blanks on Fri Sep 18, 2011  6:49 PM ------      Message from: Cassell Clement      Created: Sun Sep 13, 2011  8:30 AM       Please report to patient.  The recent labs are stable. Continue same medication and careful diet. Thyroid and CBC normal. Two of LFTs slightly high probably fatty liver---lose weight, avoid much alcohol/wine (if she drinks)

## 2011-09-21 DIAGNOSIS — H43819 Vitreous degeneration, unspecified eye: Secondary | ICD-10-CM | POA: Diagnosis not present

## 2011-10-16 DIAGNOSIS — S139XXA Sprain of joints and ligaments of unspecified parts of neck, initial encounter: Secondary | ICD-10-CM | POA: Diagnosis not present

## 2011-10-16 DIAGNOSIS — S060X9A Concussion with loss of consciousness of unspecified duration, initial encounter: Secondary | ICD-10-CM | POA: Diagnosis not present

## 2011-10-19 ENCOUNTER — Ambulatory Visit: Payer: Self-pay

## 2011-10-19 DIAGNOSIS — S0990XA Unspecified injury of head, initial encounter: Secondary | ICD-10-CM | POA: Diagnosis not present

## 2011-10-22 DIAGNOSIS — K5732 Diverticulitis of large intestine without perforation or abscess without bleeding: Secondary | ICD-10-CM | POA: Diagnosis not present

## 2011-10-29 ENCOUNTER — Other Ambulatory Visit: Payer: Self-pay | Admitting: *Deleted

## 2011-10-29 DIAGNOSIS — M549 Dorsalgia, unspecified: Secondary | ICD-10-CM

## 2011-10-29 MED ORDER — HYDROCODONE-ACETAMINOPHEN 5-500 MG PO TABS: 1.0000 | ORAL_TABLET | Freq: Four times a day (QID) | ORAL | Status: DC | PRN

## 2011-10-29 NOTE — Telephone Encounter (Signed)
Refilled hydrocodone one time

## 2011-12-10 ENCOUNTER — Other Ambulatory Visit: Payer: Self-pay | Admitting: *Deleted

## 2011-12-11 ENCOUNTER — Other Ambulatory Visit: Payer: Self-pay | Admitting: *Deleted

## 2011-12-11 DIAGNOSIS — F419 Anxiety disorder, unspecified: Secondary | ICD-10-CM

## 2011-12-11 MED ORDER — ALPRAZOLAM 0.25 MG PO TABS: 0.2500 mg | ORAL_TABLET | Freq: Every evening | ORAL | Status: DC | PRN

## 2012-01-06 DIAGNOSIS — R252 Cramp and spasm: Secondary | ICD-10-CM | POA: Diagnosis not present

## 2012-02-01 ENCOUNTER — Other Ambulatory Visit: Payer: Self-pay | Admitting: Cardiology

## 2012-02-01 DIAGNOSIS — M549 Dorsalgia, unspecified: Secondary | ICD-10-CM

## 2012-02-01 NOTE — Telephone Encounter (Signed)
Pt needs vicodin refill, requesting today she's out , pt [phone]

## 2012-02-02 NOTE — Telephone Encounter (Signed)
Left message for patient to call back  

## 2012-02-02 NOTE — Telephone Encounter (Signed)
Follow-up:    Patient called in because her medication was not refilled yet.  Please call back.

## 2012-02-03 ENCOUNTER — Other Ambulatory Visit: Payer: Self-pay | Admitting: Cardiology

## 2012-02-03 DIAGNOSIS — M549 Dorsalgia, unspecified: Secondary | ICD-10-CM

## 2012-02-03 NOTE — Telephone Encounter (Signed)
Pt rtn your call

## 2012-02-03 NOTE — Telephone Encounter (Signed)
Will discuss medication tomorrow with  Dr. Patty Sermons

## 2012-02-04 NOTE — Telephone Encounter (Signed)
Pt rtn your call/lg °

## 2012-02-04 NOTE — Telephone Encounter (Signed)
Left message called in this time only, no more refills. Needs to find PCP per  Dr. Patty Sermons

## 2012-02-05 MED ORDER — HYDROCODONE-ACETAMINOPHEN 5-500 MG PO TABS: 1.0000 | ORAL_TABLET | Freq: Four times a day (QID) | ORAL | Status: DC | PRN

## 2012-02-05 NOTE — Telephone Encounter (Signed)
Advised patient

## 2012-02-10 DIAGNOSIS — N390 Urinary tract infection, site not specified: Secondary | ICD-10-CM | POA: Diagnosis not present

## 2012-02-10 DIAGNOSIS — R109 Unspecified abdominal pain: Secondary | ICD-10-CM | POA: Diagnosis not present

## 2012-02-10 DIAGNOSIS — M545 Low back pain: Secondary | ICD-10-CM | POA: Diagnosis not present

## 2012-02-11 DIAGNOSIS — Z23 Encounter for immunization: Secondary | ICD-10-CM | POA: Diagnosis not present

## 2012-02-23 DIAGNOSIS — G8929 Other chronic pain: Secondary | ICD-10-CM | POA: Diagnosis not present

## 2012-02-23 DIAGNOSIS — N390 Urinary tract infection, site not specified: Secondary | ICD-10-CM | POA: Diagnosis not present

## 2012-02-23 DIAGNOSIS — J019 Acute sinusitis, unspecified: Secondary | ICD-10-CM | POA: Diagnosis not present

## 2012-02-26 ENCOUNTER — Encounter: Payer: Self-pay | Admitting: Cardiology

## 2012-02-26 ENCOUNTER — Ambulatory Visit (INDEPENDENT_AMBULATORY_CARE_PROVIDER_SITE_OTHER): Payer: Medicare Other | Admitting: Cardiology

## 2012-02-26 VITALS — BP 140/72 | HR 54 | Resp 18 | Ht 65.0 in | Wt 172.0 lb

## 2012-02-26 DIAGNOSIS — F411 Generalized anxiety disorder: Secondary | ICD-10-CM

## 2012-02-26 DIAGNOSIS — I119 Hypertensive heart disease without heart failure: Secondary | ICD-10-CM | POA: Diagnosis not present

## 2012-02-26 DIAGNOSIS — Z1231 Encounter for screening mammogram for malignant neoplasm of breast: Secondary | ICD-10-CM | POA: Diagnosis not present

## 2012-02-26 DIAGNOSIS — M1990 Unspecified osteoarthritis, unspecified site: Secondary | ICD-10-CM

## 2012-02-26 DIAGNOSIS — R5381 Other malaise: Secondary | ICD-10-CM

## 2012-02-26 DIAGNOSIS — F419 Anxiety disorder, unspecified: Secondary | ICD-10-CM

## 2012-02-26 DIAGNOSIS — R5383 Other fatigue: Secondary | ICD-10-CM

## 2012-02-26 DIAGNOSIS — E785 Hyperlipidemia, unspecified: Secondary | ICD-10-CM | POA: Diagnosis not present

## 2012-02-26 DIAGNOSIS — E7800 Pure hypercholesterolemia, unspecified: Secondary | ICD-10-CM

## 2012-02-26 DIAGNOSIS — R002 Palpitations: Secondary | ICD-10-CM

## 2012-02-26 DIAGNOSIS — M549 Dorsalgia, unspecified: Secondary | ICD-10-CM

## 2012-02-26 MED ORDER — HYDROCODONE-ACETAMINOPHEN 5-500 MG PO TABS: 1.0000 | ORAL_TABLET | Freq: Four times a day (QID) | ORAL | Status: DC | PRN

## 2012-02-26 MED ORDER — ATORVASTATIN CALCIUM 10 MG PO TABS: 10.0000 mg | ORAL_TABLET | Freq: Every day | ORAL | Status: DC

## 2012-02-26 MED ORDER — VALSARTAN-HYDROCHLOROTHIAZIDE 160-12.5 MG PO TABS: 1.0000 | ORAL_TABLET | Freq: Every day | ORAL | Status: DC

## 2012-02-26 MED ORDER — ALPRAZOLAM 0.25 MG PO TABS: 0.2500 mg | ORAL_TABLET | Freq: Every evening | ORAL | Status: DC | PRN

## 2012-02-26 NOTE — Patient Instructions (Addendum)
Your physician recommends that you continue on your current medications as directed. Please refer to the Current Medication list given to you today.  Your physician recommends that you schedule a follow-up appointment in: 4 months with fasting labs (lp/bmet/hfp)   Will obtain labs today and call you with the results (lp./bmet/hfp) 

## 2012-02-26 NOTE — Assessment & Plan Note (Signed)
Patient remains on Lipitor 10 mg daily.  Blood work is pending.  She's not having any myalgias from Lipitor

## 2012-02-26 NOTE — Assessment & Plan Note (Signed)
The patient has chronic back pain and is on Vicodin.  She is asking for a refill of her Vicodin.  We told her that we would refill at this one time but thereafter she needs to get her Vicodin and her alprazolam from a primary care physician.

## 2012-02-26 NOTE — Progress Notes (Signed)
Tamara Fleming Date of Birth:  December 23, 1945 [facility] [address] [place], [place]  [address] [phone]         Fax   [phone]  History of Present Illness: This pleasant 66 year old woman is seen for a scheduled 4 month followup office visit. She has a past history of hypercholesterolemia and a history of atypical chest pain. She does not have any known ischemic heart disease. He has had previously normal nuclear stress tests with normal LV function. He had an echocardiogram in 08/11/06 which showed an ejection fraction of 55-60% and normal systolic and diastolic function and no significant valve abnormalities.  She has a past history of palpitations.  She also has a history of chronic back pain for which she takes Vicodin.  Recently she has had a bad sinus infection as well as a urinary tract infection for which she is on antibiotics.  She is scheduled to leave for a cruise to the [place] in several days.   Current Outpatient Prescriptions  Medication Sig Dispense Refill  . ALPRAZolam (XANAX) 0.25 MG tablet Take 1 tablet (0.25 mg total) by mouth at bedtime as needed.  90 tablet  1  . aspirin 81 MG tablet Take 81 mg by mouth daily.       Marland Kitchen atorvastatin (LIPITOR) 10 MG tablet Take 1 tablet (10 mg total) by mouth daily.  90 tablet  3  . HYDROcodone-acetaminophen (VICODIN) 5-500 MG per tablet Take 1 tablet by mouth every 6 (six) hours as needed for pain.  90 tablet  0  . nitrofurantoin, macrocrystal-monohydrate, (MACROBID) 100 MG capsule       . valsartan-hydrochlorothiazide (DIOVAN HCT) 160-12.5 MG per tablet Take 1 tablet by mouth daily.  90 tablet  3  . [DISCONTINUED] atorvastatin (LIPITOR) 10 MG tablet Take 1 tablet (10 mg total) by mouth daily.  90 tablet  3  . [DISCONTINUED] valsartan-hydrochlorothiazide (DIOVAN HCT) 160-12.5 MG per tablet Take 1 tablet by mouth daily.  90 tablet  3    Allergies  Allergen Reactions  . Ciprofloxacin   . Floxin (Ofloxacin)      Patient Active Problem List  Diagnosis  . Chest pain  . Dyslipidemia  . Osteoarthritis  . Fatigue  . Rapid palpitations  . Benign hypertensive heart disease without heart failure    History  Smoking status  . Never Smoker   Smokeless tobacco  . Not on file    History  Alcohol Use No    Family History  Problem Relation Age of Onset  . Coronary artery disease Mother     Review of Systems: Constitutional: no fever chills diaphoresis or fatigue or change in weight.  Head and neck: no hearing loss, no epistaxis, no photophobia or visual disturbance. Respiratory: No cough, shortness of breath or wheezing. Cardiovascular: No chest pain peripheral edema, palpitations. Gastrointestinal: No abdominal distention, no abdominal pain, no change in bowel habits hematochezia or melena. Genitourinary: No dysuria, no frequency, no urgency, no nocturia. Musculoskeletal:No arthralgias, no back pain, no gait disturbance or myalgias. Neurological: No dizziness, no headaches, no numbness, no seizures, no syncope, no weakness, no tremors. Hematologic: No lymphadenopathy, no easy bruising. Psychiatric: No confusion, no hallucinations, no sleep disturbance.    Physical Exam: Filed Vitals:   02/26/12 1522  BP: 140/72  Pulse: 54  Resp: 18   the general appearance reveals a well-developed well-nourished woman in no distress.The head and neck exam reveals pupils equal and reactive.  Extraocular movements are full.  There is no scleral icterus.  The mouth and pharynx are normal.  The neck is supple.  The carotids reveal no bruits.  The jugular venous pressure is normal.  The  thyroid is not enlarged.  There is no lymphadenopathy.  The chest is clear to percussion and auscultation.  There are no rales or rhonchi.  Expansion of the chest is symmetrical.  The precordium is quiet.  The first heart sound is normal.  The second heart sound is physiologically split.  There is a very soft systolic  ejection murmur at the left sternal edge grade 1/6.  No diastolic murmur.  There is no abnormal lift or heave.  The abdomen is soft and nontender.  The bowel sounds are normal.  The liver and spleen are not enlarged.  There are no abdominal masses.  There are no abdominal bruits.  Extremities reveal good pedal pulses.  There is no phlebitis or edema.  There is no cyanosis or clubbing.  Strength is normal and symmetrical in all extremities.  There is no lateralizing weakness.  There are no sensory deficits.  The skin is warm and dry.  There is no rash.     Assessment / Plan: Continue same medication.  Recheck in 4 months for office visit lipid panel hepatic function panel and basal metabolic panel.  When she returns from her cruise she will work harder on establishing with a primary care provider for noncardiac care.

## 2012-02-26 NOTE — Assessment & Plan Note (Signed)
The patient has not been experiencing any chest pain or angina. 

## 2012-02-26 NOTE — Assessment & Plan Note (Signed)
The patient has not had any recent rapid palpitations

## 2012-02-27 LAB — LIPID PANEL
Cholesterol: 171 mg/dL (ref 0–200)
VLDL: 28 mg/dL (ref 0–40)

## 2012-02-27 LAB — BASIC METABOLIC PANEL
BUN: 11 mg/dL (ref 6–23)
CO2: 24 mEq/L (ref 19–32)
Chloride: 100 mEq/L (ref 96–112)
Glucose, Bld: 89 mg/dL (ref 70–99)
Potassium: 3.9 mEq/L (ref 3.5–5.3)

## 2012-02-27 LAB — HEPATIC FUNCTION PANEL
ALT: 44 U/L — ABNORMAL HIGH (ref 0–35)
AST: 77 U/L — ABNORMAL HIGH (ref 0–37)
Albumin: 4.7 g/dL (ref 3.5–5.2)
Total Protein: 7.7 g/dL (ref 6.0–8.3)

## 2012-03-01 ENCOUNTER — Telehealth: Payer: Self-pay | Admitting: *Deleted

## 2012-03-01 NOTE — Telephone Encounter (Signed)
Advised patient of lab results  

## 2012-03-01 NOTE — Telephone Encounter (Signed)
Message copied by Burnell Blanks on Tue Mar 01, 2012  3:14 PM ------      Message from: Cassell Clement      Created: Sun Feb 28, 2012  5:48 PM       Two of LFTs are elevated.  This could be from pain medicine (acetaminophen) or from fatty liver.  Watch diet, limit carbs and alcohol, reduce pain meds as much as possible

## 2012-04-14 ENCOUNTER — Other Ambulatory Visit (INDEPENDENT_AMBULATORY_CARE_PROVIDER_SITE_OTHER): Payer: Medicare Other

## 2012-04-14 DIAGNOSIS — R7989 Other specified abnormal findings of blood chemistry: Secondary | ICD-10-CM

## 2012-04-14 LAB — HEPATIC FUNCTION PANEL
AST: 28 U/L (ref 0–37)
Alkaline Phosphatase: 57 U/L (ref 39–117)
Bilirubin, Direct: 0.1 mg/dL (ref 0.0–0.3)
Total Bilirubin: 0.8 mg/dL (ref 0.3–1.2)

## 2012-04-15 ENCOUNTER — Telehealth: Payer: Self-pay | Admitting: *Deleted

## 2012-04-15 NOTE — Telephone Encounter (Signed)
Message copied by Burnell Blanks on Fri Apr 15, 2012  5:16 PM ------      Message from: Cassell Clement      Created: Thu Apr 14, 2012  9:10 PM       Please report. LFTs are now all normal.

## 2012-04-15 NOTE — Telephone Encounter (Signed)
Advised patient of lab results  

## 2012-05-26 DIAGNOSIS — G8929 Other chronic pain: Secondary | ICD-10-CM | POA: Diagnosis not present

## 2012-05-27 DIAGNOSIS — D1800 Hemangioma unspecified site: Secondary | ICD-10-CM | POA: Diagnosis not present

## 2012-05-27 DIAGNOSIS — L538 Other specified erythematous conditions: Secondary | ICD-10-CM | POA: Diagnosis not present

## 2012-05-27 DIAGNOSIS — L821 Other seborrheic keratosis: Secondary | ICD-10-CM | POA: Diagnosis not present

## 2012-06-23 ENCOUNTER — Other Ambulatory Visit: Payer: Self-pay

## 2012-06-29 ENCOUNTER — Ambulatory Visit (INDEPENDENT_AMBULATORY_CARE_PROVIDER_SITE_OTHER): Payer: Medicare Other | Admitting: Cardiology

## 2012-06-29 ENCOUNTER — Other Ambulatory Visit (INDEPENDENT_AMBULATORY_CARE_PROVIDER_SITE_OTHER): Payer: Medicare Other

## 2012-06-29 ENCOUNTER — Encounter: Payer: Self-pay | Admitting: Cardiology

## 2012-06-29 VITALS — BP 128/72 | HR 76 | Ht 65.5 in | Wt 180.0 lb

## 2012-06-29 DIAGNOSIS — I119 Hypertensive heart disease without heart failure: Secondary | ICD-10-CM

## 2012-06-29 DIAGNOSIS — E7800 Pure hypercholesterolemia, unspecified: Secondary | ICD-10-CM | POA: Diagnosis not present

## 2012-06-29 DIAGNOSIS — R002 Palpitations: Secondary | ICD-10-CM | POA: Diagnosis not present

## 2012-06-29 DIAGNOSIS — F411 Generalized anxiety disorder: Secondary | ICD-10-CM

## 2012-06-29 DIAGNOSIS — L820 Inflamed seborrheic keratosis: Secondary | ICD-10-CM | POA: Diagnosis not present

## 2012-06-29 DIAGNOSIS — L821 Other seborrheic keratosis: Secondary | ICD-10-CM | POA: Diagnosis not present

## 2012-06-29 DIAGNOSIS — E785 Hyperlipidemia, unspecified: Secondary | ICD-10-CM | POA: Diagnosis not present

## 2012-06-29 DIAGNOSIS — L738 Other specified follicular disorders: Secondary | ICD-10-CM | POA: Diagnosis not present

## 2012-06-29 DIAGNOSIS — L538 Other specified erythematous conditions: Secondary | ICD-10-CM | POA: Diagnosis not present

## 2012-06-29 DIAGNOSIS — D1800 Hemangioma unspecified site: Secondary | ICD-10-CM | POA: Diagnosis not present

## 2012-06-29 DIAGNOSIS — F419 Anxiety disorder, unspecified: Secondary | ICD-10-CM

## 2012-06-29 LAB — HEPATIC FUNCTION PANEL
ALT: 34 U/L (ref 0–35)
Bilirubin, Direct: 0.2 mg/dL (ref 0.0–0.3)
Total Bilirubin: 1 mg/dL (ref 0.3–1.2)

## 2012-06-29 LAB — BASIC METABOLIC PANEL
CO2: 28 mEq/L (ref 19–32)
Chloride: 101 mEq/L (ref 96–112)
GFR: 69.22 mL/min (ref >60.00)
Glucose, Bld: 110 mg/dL — ABNORMAL HIGH (ref 70–99)
Potassium: 3.4 mEq/L — ABNORMAL LOW (ref 3.5–5.1)
Sodium: 139 mEq/L (ref 135–145)

## 2012-06-29 LAB — LIPID PANEL
HDL: 53.5 mg/dL (ref >39.00)
LDL Cholesterol: 100 mg/dL — ABNORMAL HIGH (ref 0–99)
Total CHOL/HDL Ratio: 3
VLDL: 25.8 mg/dL (ref 0.0–40.0)

## 2012-06-29 MED ORDER — ALPRAZOLAM 0.25 MG PO TABS: 0.2500 mg | ORAL_TABLET | Freq: Every evening | ORAL | Status: DC | PRN

## 2012-06-29 NOTE — Progress Notes (Signed)
Tamara Fleming Date of Birth:  [birth year] [facility] [address] [place], [place]  [address] [phone]         Fax   [phone]  History of Present Illness: This pleasant 67 year old woman is seen for a scheduled 4 month followup office visit. She has a past history of hypercholesterolemia and a history of atypical chest pain. She does not have any known ischemic heart disease. He has had previously normal nuclear stress tests with normal LV function. He had an echocardiogram in 08/11/06 which showed an ejection fraction of 55-60% and normal systolic and diastolic function and no significant valve abnormalities. She has a past history of palpitations. She also has a history of chronic back pain for which she takes Vicodin.  She has been experiencing some rapid palpitations at night.  Since last visit she has gained 8 pounds and has not been getting any regular exercise.  Current Outpatient Prescriptions  Medication Sig Dispense Refill  . ALPRAZolam (XANAX) 0.25 MG tablet Take 1 tablet (0.25 mg total) by mouth at bedtime as needed.  90 tablet  0  . aspirin 81 MG tablet Take 81 mg by mouth daily.       Marland Kitchen atorvastatin (LIPITOR) 10 MG tablet Take 1 tablet (10 mg total) by mouth daily.  90 tablet  3  . valsartan-hydrochlorothiazide (DIOVAN HCT) 160-12.5 MG per tablet Take 1 tablet by mouth daily.  90 tablet  3  . HYDROcodone-acetaminophen (VICODIN) 5-500 MG per tablet Take 1 tablet by mouth every 6 (six) hours as needed for pain.  90 tablet  0   No current facility-administered medications for this visit.    Allergies  Allergen Reactions  . Ciprofloxacin   . Floxin (Ofloxacin)     Patient Active Problem List  Diagnosis  . Chest pain  . Dyslipidemia  . Osteoarthritis  . Fatigue  . Rapid palpitations  . Benign hypertensive heart disease without heart failure    History  Smoking status  . Never Smoker   Smokeless tobacco  . Not on file    History    Alcohol Use No    Family History  Problem Relation Age of Onset  . Coronary artery disease Mother     Review of Systems: Constitutional: no fever chills diaphoresis or fatigue or change in weight.  Head and neck: no hearing loss, no epistaxis, no photophobia or visual disturbance. Respiratory: No cough, shortness of breath or wheezing. Cardiovascular: No chest pain peripheral edema, palpitations. Gastrointestinal: No abdominal distention, no abdominal pain, no change in bowel habits hematochezia or melena. Genitourinary: No dysuria, no frequency, no urgency, no nocturia. Musculoskeletal:No arthralgias, no back pain, no gait disturbance or myalgias. Neurological: No dizziness, no headaches, no numbness, no seizures, no syncope, no weakness, no tremors. Hematologic: No lymphadenopathy, no easy bruising. Psychiatric: No confusion, no hallucinations, no sleep disturbance.    Physical Exam: Filed Vitals:   06/29/12 1034  BP: 128/72  Pulse: 76   the general appearance reveals a well-developed well-nourished woman in no distress.The head and neck exam reveals pupils equal and reactive.  Extraocular movements are full.  There is no scleral icterus.  The mouth and pharynx are normal.  The neck is supple.  The carotids reveal no bruits.  The jugular venous pressure is normal.  The  thyroid is not enlarged.  There is no lymphadenopathy.  The chest is clear to percussion and auscultation.  There are no rales or rhonchi.  Expansion of the  chest is symmetrical.  The precordium is quiet.  The first heart sound is normal.  The second heart sound is physiologically split.  There is no murmur gallop rub or click.  There is no abnormal lift or heave.  The abdomen is soft and nontender.  The bowel sounds are normal.  The liver and spleen are not enlarged.  There are no abdominal masses.  There are no abdominal bruits.  Extremities reveal good pedal pulses.  There is no phlebitis or edema.  There is no  cyanosis or clubbing.  Strength is normal and symmetrical in all extremities.  There is no lateralizing weakness.  There are no sensory deficits.  The skin is warm and dry.  There is no rash.     Assessment / Plan: Continue same medication.  Work harder on weight loss and diet.  Recheck in 4 months for followup office visit lipid panel hepatic function panel and basal metabolic panel.  We have encouraged her to get a primary care physician.

## 2012-06-29 NOTE — Addendum Note (Signed)
Addended by: Regis Bill B on: 06/29/2012 02:02 PM   Modules accepted: Orders

## 2012-06-29 NOTE — Patient Instructions (Addendum)

## 2012-06-29 NOTE — Assessment & Plan Note (Signed)
The patient has not been having any symptoms of congestive heart failure.  She has been more sedentary.  She has not been using her home treadmill because of osteoarthritic pain in her hip.  I stressed that she try using an exercise bicycle and see if she could tolerate this better.

## 2012-06-29 NOTE — Assessment & Plan Note (Signed)
The patient has a history of dyslipidemia.  She had lab work last month which were reviewed today.  Her weight is up and she's been drinking a lot of soft drinks although they are stated to be caffeine free and diet.  She also eats a moderate amount of white potatoes.  She avoids bread.  In order to lose weight I have recommended that she significantly reduce her intake of carbohydrates.

## 2012-06-29 NOTE — Assessment & Plan Note (Signed)
The patient has been noticing palpitations at night.  She has not been experiencing them during the day.  They're not associated with any shortness of breath or chest pain.  She will continue current therapy and continue to avoid caffeine.

## 2012-07-01 ENCOUNTER — Telehealth: Payer: Self-pay | Admitting: *Deleted

## 2012-07-01 DIAGNOSIS — E876 Hypokalemia: Secondary | ICD-10-CM

## 2012-07-01 MED ORDER — POTASSIUM CHLORIDE ER 10 MEQ PO TBCR: 10.0000 meq | EXTENDED_RELEASE_TABLET | Freq: Every day | ORAL | Status: DC

## 2012-07-01 NOTE — Telephone Encounter (Signed)
Advised patient of lab results  

## 2012-07-01 NOTE — Progress Notes (Signed)
Quick Note:  Please report to patient. The recent labs are stable. Continue same medication and careful diet. The LDL is slightly higher. She needs to work harder on weight loss as we discussed at her office visit. Blood sugar is higher at 110. The potassium is too low. Add potassium chloride 10 mEq one daily ______

## 2012-07-01 NOTE — Telephone Encounter (Signed)
Message copied by Burnell Blanks on Fri Jul 01, 2012  6:18 PM ------      Message from: Cassell Clement      Created: Fri Jul 01, 2012  3:21 PM       Please report to patient.  The recent labs are stable. Continue same medication and careful diet.  The LDL is slightly higher.  She needs to work harder on weight loss as we discussed at her office visit.  Blood sugar is higher at 110.  The potassium is too low.  Add potassium chloride 10 mEq one daily ------

## 2012-07-02 DIAGNOSIS — J320 Chronic maxillary sinusitis: Secondary | ICD-10-CM | POA: Diagnosis not present

## 2012-07-06 DIAGNOSIS — J320 Chronic maxillary sinusitis: Secondary | ICD-10-CM | POA: Diagnosis not present

## 2012-07-09 DIAGNOSIS — R05 Cough: Secondary | ICD-10-CM | POA: Diagnosis not present

## 2012-07-09 DIAGNOSIS — J320 Chronic maxillary sinusitis: Secondary | ICD-10-CM | POA: Diagnosis not present

## 2012-07-13 ENCOUNTER — Emergency Department: Payer: Self-pay | Admitting: Emergency Medicine

## 2012-07-13 DIAGNOSIS — R6889 Other general symptoms and signs: Secondary | ICD-10-CM | POA: Diagnosis not present

## 2012-07-13 DIAGNOSIS — S239XXA Sprain of unspecified parts of thorax, initial encounter: Secondary | ICD-10-CM | POA: Diagnosis not present

## 2012-07-13 DIAGNOSIS — I10 Essential (primary) hypertension: Secondary | ICD-10-CM | POA: Diagnosis not present

## 2012-07-13 DIAGNOSIS — R918 Other nonspecific abnormal finding of lung field: Secondary | ICD-10-CM | POA: Diagnosis not present

## 2012-07-13 DIAGNOSIS — F411 Generalized anxiety disorder: Secondary | ICD-10-CM | POA: Diagnosis not present

## 2012-07-13 DIAGNOSIS — R55 Syncope and collapse: Secondary | ICD-10-CM | POA: Diagnosis not present

## 2012-07-13 LAB — CBC
HGB: 14.6 g/dL (ref 12.0–16.0)
MCHC: 33.7 g/dL (ref 32.0–36.0)
MCV: 101 fL — ABNORMAL HIGH (ref 80–100)
Platelet: 236 10*3/uL (ref 150–440)
RBC: 4.28 10*6/uL (ref 3.80–5.20)
RDW: 12.9 % (ref 11.5–14.5)
WBC: 9.6 10*3/uL (ref 3.6–11.0)

## 2012-07-13 LAB — COMPREHENSIVE METABOLIC PANEL
Albumin: 3.9 g/dL (ref 3.4–5.0)
Alkaline Phosphatase: 93 U/L (ref 50–136)
Anion Gap: 9 (ref 7–16)
Bilirubin,Total: 0.7 mg/dL (ref 0.2–1.0)
Calcium, Total: 8.8 mg/dL (ref 8.5–10.1)
Co2: 24 mmol/L (ref 21–32)
EGFR (African American): >60
Glucose: 151 mg/dL — ABNORMAL HIGH (ref 65–99)
Osmolality: 277 (ref 275–301)
Potassium: 3.8 mmol/L (ref 3.5–5.1)
Sodium: 135 mmol/L — ABNORMAL LOW (ref 136–145)

## 2012-07-13 LAB — URINALYSIS, COMPLETE
Bilirubin,UR: NEGATIVE
Hyaline Cast: 17
Ketone: NEGATIVE
Nitrite: NEGATIVE
Ph: 6 (ref 4.5–8.0)
Protein: NEGATIVE
RBC,UR: 11 /HPF (ref 0–5)
Specific Gravity: 1.015 (ref 1.003–1.030)
Squamous Epithelial: 14

## 2012-07-13 LAB — CK TOTAL AND CKMB (NOT AT ARMC): CK-MB: 0.7 ng/mL (ref 0.5–3.6)

## 2012-07-13 LAB — PRO B NATRIURETIC PEPTIDE: B-Type Natriuretic Peptide: 43 pg/mL (ref 0–125)

## 2012-07-14 ENCOUNTER — Observation Stay: Payer: Self-pay | Admitting: Internal Medicine

## 2012-07-14 DIAGNOSIS — R42 Dizziness and giddiness: Secondary | ICD-10-CM | POA: Diagnosis not present

## 2012-07-14 DIAGNOSIS — E669 Obesity, unspecified: Secondary | ICD-10-CM | POA: Diagnosis not present

## 2012-07-14 DIAGNOSIS — H6690 Otitis media, unspecified, unspecified ear: Secondary | ICD-10-CM | POA: Diagnosis not present

## 2012-07-14 DIAGNOSIS — J40 Bronchitis, not specified as acute or chronic: Secondary | ICD-10-CM | POA: Diagnosis not present

## 2012-07-14 DIAGNOSIS — R5381 Other malaise: Secondary | ICD-10-CM | POA: Diagnosis not present

## 2012-07-14 DIAGNOSIS — Z79899 Other long term (current) drug therapy: Secondary | ICD-10-CM | POA: Diagnosis not present

## 2012-07-14 DIAGNOSIS — R55 Syncope and collapse: Secondary | ICD-10-CM | POA: Diagnosis not present

## 2012-07-14 DIAGNOSIS — R6889 Other general symptoms and signs: Secondary | ICD-10-CM | POA: Diagnosis not present

## 2012-07-14 DIAGNOSIS — M549 Dorsalgia, unspecified: Secondary | ICD-10-CM | POA: Diagnosis not present

## 2012-07-14 DIAGNOSIS — J329 Chronic sinusitis, unspecified: Secondary | ICD-10-CM | POA: Diagnosis not present

## 2012-07-14 DIAGNOSIS — Z7982 Long term (current) use of aspirin: Secondary | ICD-10-CM | POA: Diagnosis not present

## 2012-07-14 DIAGNOSIS — R634 Abnormal weight loss: Secondary | ICD-10-CM | POA: Diagnosis not present

## 2012-07-14 DIAGNOSIS — E876 Hypokalemia: Secondary | ICD-10-CM | POA: Diagnosis not present

## 2012-07-14 DIAGNOSIS — H6590 Unspecified nonsuppurative otitis media, unspecified ear: Secondary | ICD-10-CM | POA: Diagnosis not present

## 2012-07-14 DIAGNOSIS — K5900 Constipation, unspecified: Secondary | ICD-10-CM | POA: Diagnosis not present

## 2012-07-14 DIAGNOSIS — R918 Other nonspecific abnormal finding of lung field: Secondary | ICD-10-CM | POA: Diagnosis not present

## 2012-07-14 DIAGNOSIS — S239XXA Sprain of unspecified parts of thorax, initial encounter: Secondary | ICD-10-CM | POA: Diagnosis not present

## 2012-07-14 DIAGNOSIS — E785 Hyperlipidemia, unspecified: Secondary | ICD-10-CM | POA: Diagnosis not present

## 2012-07-14 DIAGNOSIS — I951 Orthostatic hypotension: Secondary | ICD-10-CM | POA: Diagnosis not present

## 2012-07-14 DIAGNOSIS — R6883 Chills (without fever): Secondary | ICD-10-CM | POA: Diagnosis not present

## 2012-07-14 DIAGNOSIS — E860 Dehydration: Secondary | ICD-10-CM | POA: Diagnosis not present

## 2012-07-14 DIAGNOSIS — R61 Generalized hyperhidrosis: Secondary | ICD-10-CM | POA: Diagnosis not present

## 2012-07-14 DIAGNOSIS — Z6829 Body mass index (BMI) 29.0-29.9, adult: Secondary | ICD-10-CM | POA: Diagnosis not present

## 2012-07-14 DIAGNOSIS — R29898 Other symptoms and signs involving the musculoskeletal system: Secondary | ICD-10-CM | POA: Diagnosis not present

## 2012-07-14 LAB — CBC
HCT: 40.1 % (ref 35.0–47.0)
HGB: 13.6 g/dL (ref 12.0–16.0)
MCH: 34.3 pg — ABNORMAL HIGH (ref 26.0–34.0)
MCHC: 34 g/dL (ref 32.0–36.0)
MCV: 101 fL — ABNORMAL HIGH (ref 80–100)
RBC: 3.97 10*6/uL (ref 3.80–5.20)
WBC: 6.6 10*3/uL (ref 3.6–11.0)

## 2012-07-14 LAB — URINALYSIS, COMPLETE
Bacteria: NONE SEEN
Bilirubin,UR: NEGATIVE
Blood: NEGATIVE
Granular Cast: 4
Ketone: NEGATIVE
Ph: 6 (ref 4.5–8.0)
Protein: NEGATIVE
RBC,UR: 1 /HPF (ref 0–5)
Squamous Epithelial: 5
Transitional Epi: <1

## 2012-07-14 LAB — COMPREHENSIVE METABOLIC PANEL
Albumin: 3.5 g/dL (ref 3.4–5.0)
Alkaline Phosphatase: 88 U/L (ref 50–136)
BUN: 18 mg/dL (ref 7–18)
Calcium, Total: 8.5 mg/dL (ref 8.5–10.1)
Chloride: 101 mmol/L (ref 98–107)
Creatinine: 1.26 mg/dL (ref 0.60–1.30)
EGFR (Non-African Amer.): 44 — ABNORMAL LOW
Glucose: 115 mg/dL — ABNORMAL HIGH (ref 65–99)
Osmolality: 275 (ref 275–301)
SGOT(AST): 32 U/L (ref 15–37)
Total Protein: 6.8 g/dL (ref 6.4–8.2)

## 2012-07-14 LAB — CK TOTAL AND CKMB (NOT AT ARMC)
CK, Total: 32 U/L (ref 21–215)
CK-MB: 0.7 ng/mL (ref 0.5–3.6)

## 2012-07-14 LAB — TROPONIN I: Troponin-I: <0.02 ng/mL

## 2012-07-15 DIAGNOSIS — R55 Syncope and collapse: Secondary | ICD-10-CM | POA: Diagnosis not present

## 2012-07-15 DIAGNOSIS — I951 Orthostatic hypotension: Secondary | ICD-10-CM | POA: Diagnosis not present

## 2012-07-15 DIAGNOSIS — R0789 Other chest pain: Secondary | ICD-10-CM | POA: Diagnosis not present

## 2012-07-15 DIAGNOSIS — R011 Cardiac murmur, unspecified: Secondary | ICD-10-CM | POA: Diagnosis not present

## 2012-07-15 DIAGNOSIS — H6590 Unspecified nonsuppurative otitis media, unspecified ear: Secondary | ICD-10-CM | POA: Diagnosis not present

## 2012-07-15 DIAGNOSIS — E785 Hyperlipidemia, unspecified: Secondary | ICD-10-CM | POA: Diagnosis not present

## 2012-07-15 LAB — LIPID PANEL
Cholesterol: 109 mg/dL (ref 0–200)
Ldl Cholesterol, Calc: 51 mg/dL (ref 0–100)
VLDL Cholesterol, Calc: 23 mg/dL (ref 5–40)

## 2012-07-15 LAB — TROPONIN I: Troponin-I: <0.02 ng/mL

## 2012-07-15 LAB — BASIC METABOLIC PANEL
BUN: 19 mg/dL — ABNORMAL HIGH (ref 7–18)
Co2: 28 mmol/L (ref 21–32)
Creatinine: 0.84 mg/dL (ref 0.60–1.30)
EGFR (Non-African Amer.): >60
Osmolality: 283 (ref 275–301)

## 2012-07-15 LAB — CBC WITH DIFFERENTIAL/PLATELET
Eosinophil #: 0.2 10*3/uL (ref 0.0–0.7)
HGB: 12.4 g/dL (ref 12.0–16.0)
Lymphocyte %: 34 %
MCH: 34.6 pg — ABNORMAL HIGH (ref 26.0–34.0)
MCHC: 34.3 g/dL (ref 32.0–36.0)
Monocyte #: 1 x10 3/mm — ABNORMAL HIGH (ref 0.2–0.9)
Neutrophil #: 3.8 10*3/uL (ref 1.4–6.5)
Neutrophil %: 50.1 %
Platelet: 187 10*3/uL (ref 150–440)
RBC: 3.58 10*6/uL — ABNORMAL LOW (ref 3.80–5.20)
RDW: 12.8 % (ref 11.5–14.5)
WBC: 7.6 10*3/uL (ref 3.6–11.0)

## 2012-07-16 LAB — URINE CULTURE

## 2012-07-20 ENCOUNTER — Other Ambulatory Visit: Payer: Medicare Other

## 2012-07-25 DIAGNOSIS — G8929 Other chronic pain: Secondary | ICD-10-CM | POA: Diagnosis not present

## 2012-07-25 DIAGNOSIS — I10 Essential (primary) hypertension: Secondary | ICD-10-CM | POA: Diagnosis not present

## 2012-07-25 DIAGNOSIS — R55 Syncope and collapse: Secondary | ICD-10-CM | POA: Diagnosis not present

## 2012-07-25 DIAGNOSIS — E119 Type 2 diabetes mellitus without complications: Secondary | ICD-10-CM | POA: Diagnosis not present

## 2012-07-27 ENCOUNTER — Ambulatory Visit
Admission: RE | Admit: 2012-07-27 | Discharge: 2012-07-27 | Disposition: A | Discharge status: Home / Self Care | Payer: Medicare Other | Source: Ambulatory Visit

## 2012-07-27 DIAGNOSIS — Z1231 Encounter for screening mammogram for malignant neoplasm of breast: Secondary | ICD-10-CM

## 2012-08-31 DIAGNOSIS — I10 Essential (primary) hypertension: Secondary | ICD-10-CM | POA: Diagnosis not present

## 2012-08-31 DIAGNOSIS — R1084 Generalized abdominal pain: Secondary | ICD-10-CM | POA: Diagnosis not present

## 2012-09-01 ENCOUNTER — Inpatient Hospital Stay: Payer: Self-pay | Admitting: Internal Medicine

## 2012-09-01 DIAGNOSIS — E785 Hyperlipidemia, unspecified: Secondary | ICD-10-CM | POA: Diagnosis present

## 2012-09-01 DIAGNOSIS — Z7982 Long term (current) use of aspirin: Secondary | ICD-10-CM | POA: Diagnosis not present

## 2012-09-01 DIAGNOSIS — I10 Essential (primary) hypertension: Secondary | ICD-10-CM | POA: Diagnosis not present

## 2012-09-01 DIAGNOSIS — D279 Benign neoplasm of unspecified ovary: Secondary | ICD-10-CM | POA: Diagnosis not present

## 2012-09-01 DIAGNOSIS — I959 Hypotension, unspecified: Secondary | ICD-10-CM | POA: Diagnosis not present

## 2012-09-01 DIAGNOSIS — G8929 Other chronic pain: Secondary | ICD-10-CM | POA: Diagnosis not present

## 2012-09-01 DIAGNOSIS — R55 Syncope and collapse: Secondary | ICD-10-CM | POA: Diagnosis not present

## 2012-09-01 DIAGNOSIS — K5732 Diverticulitis of large intestine without perforation or abscess without bleeding: Secondary | ICD-10-CM | POA: Diagnosis not present

## 2012-09-01 DIAGNOSIS — N83209 Unspecified ovarian cyst, unspecified side: Secondary | ICD-10-CM | POA: Diagnosis not present

## 2012-09-01 DIAGNOSIS — R1084 Generalized abdominal pain: Secondary | ICD-10-CM | POA: Diagnosis not present

## 2012-09-01 DIAGNOSIS — E876 Hypokalemia: Secondary | ICD-10-CM | POA: Diagnosis not present

## 2012-09-01 DIAGNOSIS — A419 Sepsis, unspecified organism: Secondary | ICD-10-CM | POA: Diagnosis not present

## 2012-09-01 DIAGNOSIS — R1032 Left lower quadrant pain: Secondary | ICD-10-CM | POA: Diagnosis not present

## 2012-09-01 DIAGNOSIS — M549 Dorsalgia, unspecified: Secondary | ICD-10-CM | POA: Diagnosis present

## 2012-09-01 DIAGNOSIS — R6510 Systemic inflammatory response syndrome (SIRS) of non-infectious origin without acute organ dysfunction: Secondary | ICD-10-CM | POA: Diagnosis not present

## 2012-09-01 DIAGNOSIS — R109 Unspecified abdominal pain: Secondary | ICD-10-CM | POA: Diagnosis not present

## 2012-09-01 DIAGNOSIS — Z86010 Personal history of colonic polyps: Secondary | ICD-10-CM | POA: Diagnosis not present

## 2012-09-01 LAB — CBC WITH DIFFERENTIAL/PLATELET
Basophil #: 0 10*3/uL (ref 0.0–0.1)
Eosinophil #: 0.1 10*3/uL (ref 0.0–0.7)
Eosinophil %: 0.8 %
HGB: 12.7 g/dL (ref 12.0–16.0)
Lymphocyte #: 1.7 10*3/uL (ref 1.0–3.6)
Lymphocyte %: 14.2 %
MCH: 33.9 pg (ref 26.0–34.0)
MCV: 100 fL (ref 80–100)
Monocyte #: 1.2 x10 3/mm — ABNORMAL HIGH (ref 0.2–0.9)
Monocyte %: 9.5 %
Neutrophil %: 75.3 %
RBC: 3.73 10*6/uL — ABNORMAL LOW (ref 3.80–5.20)
RDW: 13.5 % (ref 11.5–14.5)

## 2012-09-01 LAB — URINALYSIS, COMPLETE
Blood: NEGATIVE
Glucose,UR: NEGATIVE mg/dL (ref 0–75)
Hyaline Cast: 5
Ketone: NEGATIVE
Nitrite: NEGATIVE
Squamous Epithelial: 3
WBC UR: <1 /HPF (ref 0–5)

## 2012-09-01 LAB — COMPREHENSIVE METABOLIC PANEL
Albumin: 4 g/dL (ref 3.4–5.0)
Alkaline Phosphatase: 88 U/L (ref 50–136)
Anion Gap: 9 (ref 7–16)
Bilirubin,Total: 1.1 mg/dL — ABNORMAL HIGH (ref 0.2–1.0)
Chloride: 103 mmol/L (ref 98–107)
Co2: 25 mmol/L (ref 21–32)
EGFR (African American): 60 — ABNORMAL LOW
EGFR (Non-African Amer.): 52 — ABNORMAL LOW
Glucose: 141 mg/dL — ABNORMAL HIGH (ref 65–99)
Osmolality: 277 (ref 275–301)
Potassium: 3.6 mmol/L (ref 3.5–5.1)

## 2012-09-01 LAB — TROPONIN I: Troponin-I: <0.02 ng/mL

## 2012-09-01 LAB — HM MAMMOGRAPHY

## 2012-09-02 LAB — CBC WITH DIFFERENTIAL/PLATELET
Basophil #: 0 10*3/uL (ref 0.0–0.1)
Eosinophil #: 0.1 10*3/uL (ref 0.0–0.7)
Eosinophil %: 1.4 %
HCT: 35.2 % (ref 35.0–47.0)
HGB: 12.2 g/dL (ref 12.0–16.0)
Lymphocyte #: 2 10*3/uL (ref 1.0–3.6)
Lymphocyte %: 19.8 %
MCH: 34.8 pg — ABNORMAL HIGH (ref 26.0–34.0)
MCHC: 34.8 g/dL (ref 32.0–36.0)
Monocyte #: 1.1 x10 3/mm — ABNORMAL HIGH (ref 0.2–0.9)
Monocyte %: 11.2 %
Neutrophil #: 6.9 10*3/uL — ABNORMAL HIGH (ref 1.4–6.5)
Neutrophil %: 67.3 %
Platelet: 180 10*3/uL (ref 150–440)
RBC: 3.52 10*6/uL — ABNORMAL LOW (ref 3.80–5.20)

## 2012-09-02 LAB — BASIC METABOLIC PANEL
Anion Gap: 6 — ABNORMAL LOW (ref 7–16)
Calcium, Total: 8.3 mg/dL — ABNORMAL LOW (ref 8.5–10.1)
Chloride: 105 mmol/L (ref 98–107)
Co2: 28 mmol/L (ref 21–32)
Creatinine: 0.96 mg/dL (ref 0.60–1.30)
Glucose: 112 mg/dL — ABNORMAL HIGH (ref 65–99)

## 2012-09-04 LAB — CBC WITH DIFFERENTIAL/PLATELET
Basophil #: 0 10*3/uL (ref 0.0–0.1)
Eosinophil #: 0.2 10*3/uL (ref 0.0–0.7)
Eosinophil %: 2.7 %
HGB: 11.1 g/dL — ABNORMAL LOW (ref 12.0–16.0)
Lymphocyte #: 2 10*3/uL (ref 1.0–3.6)
MCH: 34.9 pg — ABNORMAL HIGH (ref 26.0–34.0)
MCHC: 34.8 g/dL (ref 32.0–36.0)
MCV: 100 fL (ref 80–100)
Monocyte #: 0.7 x10 3/mm (ref 0.2–0.9)
Monocyte %: 9.3 %
Neutrophil #: 4.7 10*3/uL (ref 1.4–6.5)
Neutrophil %: 61.1 %
Platelet: 197 10*3/uL (ref 150–440)
RDW: 13 % (ref 11.5–14.5)

## 2012-09-05 LAB — BASIC METABOLIC PANEL
Anion Gap: 5 — ABNORMAL LOW (ref 7–16)
Calcium, Total: 9 mg/dL (ref 8.5–10.1)
Chloride: 105 mmol/L (ref 98–107)
Co2: 29 mmol/L (ref 21–32)
Creatinine: 0.74 mg/dL (ref 0.60–1.30)
EGFR (African American): >60
EGFR (Non-African Amer.): >60
Glucose: 108 mg/dL — ABNORMAL HIGH (ref 65–99)
Sodium: 139 mmol/L (ref 136–145)

## 2012-09-07 DIAGNOSIS — I10 Essential (primary) hypertension: Secondary | ICD-10-CM | POA: Diagnosis not present

## 2012-09-07 DIAGNOSIS — K5732 Diverticulitis of large intestine without perforation or abscess without bleeding: Secondary | ICD-10-CM | POA: Diagnosis not present

## 2012-09-07 LAB — CULTURE, BLOOD (SINGLE)

## 2012-09-12 DIAGNOSIS — I10 Essential (primary) hypertension: Secondary | ICD-10-CM | POA: Diagnosis not present

## 2012-09-12 DIAGNOSIS — M109 Gout, unspecified: Secondary | ICD-10-CM | POA: Diagnosis not present

## 2012-09-20 DIAGNOSIS — K5732 Diverticulitis of large intestine without perforation or abscess without bleeding: Secondary | ICD-10-CM | POA: Diagnosis not present

## 2012-09-20 DIAGNOSIS — I10 Essential (primary) hypertension: Secondary | ICD-10-CM | POA: Diagnosis not present

## 2012-09-20 DIAGNOSIS — G8929 Other chronic pain: Secondary | ICD-10-CM | POA: Diagnosis not present

## 2012-10-28 DIAGNOSIS — Z86010 Personal history of colonic polyps: Secondary | ICD-10-CM | POA: Diagnosis not present

## 2012-10-28 DIAGNOSIS — K5732 Diverticulitis of large intestine without perforation or abscess without bleeding: Secondary | ICD-10-CM | POA: Diagnosis not present

## 2012-11-18 DIAGNOSIS — I10 Essential (primary) hypertension: Secondary | ICD-10-CM | POA: Diagnosis not present

## 2012-11-18 DIAGNOSIS — G8929 Other chronic pain: Secondary | ICD-10-CM | POA: Diagnosis not present

## 2013-01-05 ENCOUNTER — Other Ambulatory Visit: Payer: Self-pay

## 2013-01-05 DIAGNOSIS — I119 Hypertensive heart disease without heart failure: Secondary | ICD-10-CM

## 2013-01-05 MED ORDER — VALSARTAN-HYDROCHLOROTHIAZIDE 160-12.5 MG PO TABS: 1.0000 | ORAL_TABLET | Freq: Every day | ORAL | Status: DC

## 2013-02-01 DIAGNOSIS — D1800 Hemangioma unspecified site: Secondary | ICD-10-CM | POA: Diagnosis not present

## 2013-02-08 DIAGNOSIS — D1800 Hemangioma unspecified site: Secondary | ICD-10-CM | POA: Diagnosis not present

## 2013-02-27 DIAGNOSIS — Z23 Encounter for immunization: Secondary | ICD-10-CM | POA: Diagnosis not present

## 2013-04-06 HISTORY — PX: CARPAL TUNNEL RELEASE: SHX101

## 2013-04-11 LAB — HM COLONOSCOPY

## 2013-04-14 ENCOUNTER — Ambulatory Visit: Payer: Self-pay | Admitting: Gastroenterology

## 2013-04-14 DIAGNOSIS — E785 Hyperlipidemia, unspecified: Secondary | ICD-10-CM | POA: Diagnosis not present

## 2013-04-14 DIAGNOSIS — Z7982 Long term (current) use of aspirin: Secondary | ICD-10-CM | POA: Diagnosis not present

## 2013-04-14 DIAGNOSIS — M109 Gout, unspecified: Secondary | ICD-10-CM | POA: Diagnosis not present

## 2013-04-14 DIAGNOSIS — Z888 Allergy status to other drugs, medicaments and biological substances status: Secondary | ICD-10-CM | POA: Diagnosis not present

## 2013-04-14 DIAGNOSIS — Z538 Procedure and treatment not carried out for other reasons: Secondary | ICD-10-CM | POA: Diagnosis not present

## 2013-04-14 DIAGNOSIS — Z1211 Encounter for screening for malignant neoplasm of colon: Secondary | ICD-10-CM | POA: Diagnosis not present

## 2013-04-14 DIAGNOSIS — Z86010 Personal history of colonic polyps: Secondary | ICD-10-CM | POA: Diagnosis not present

## 2013-04-14 DIAGNOSIS — K5730 Diverticulosis of large intestine without perforation or abscess without bleeding: Secondary | ICD-10-CM | POA: Diagnosis not present

## 2013-04-14 DIAGNOSIS — Z801 Family history of malignant neoplasm of trachea, bronchus and lung: Secondary | ICD-10-CM | POA: Diagnosis not present

## 2013-04-14 DIAGNOSIS — Z09 Encounter for follow-up examination after completed treatment for conditions other than malignant neoplasm: Secondary | ICD-10-CM | POA: Diagnosis not present

## 2013-04-14 DIAGNOSIS — E039 Hypothyroidism, unspecified: Secondary | ICD-10-CM | POA: Diagnosis not present

## 2013-04-14 DIAGNOSIS — K3189 Other diseases of stomach and duodenum: Secondary | ICD-10-CM | POA: Diagnosis not present

## 2013-04-14 DIAGNOSIS — F411 Generalized anxiety disorder: Secondary | ICD-10-CM | POA: Diagnosis not present

## 2013-04-14 DIAGNOSIS — Z5309 Procedure and treatment not carried out because of other contraindication: Secondary | ICD-10-CM | POA: Diagnosis not present

## 2013-04-14 DIAGNOSIS — R011 Cardiac murmur, unspecified: Secondary | ICD-10-CM | POA: Diagnosis not present

## 2013-04-14 DIAGNOSIS — I10 Essential (primary) hypertension: Secondary | ICD-10-CM | POA: Diagnosis not present

## 2013-04-14 DIAGNOSIS — E079 Disorder of thyroid, unspecified: Secondary | ICD-10-CM | POA: Diagnosis not present

## 2013-04-14 DIAGNOSIS — Z79899 Other long term (current) drug therapy: Secondary | ICD-10-CM | POA: Diagnosis not present

## 2013-04-14 LAB — HM COLONOSCOPY

## 2013-04-20 DIAGNOSIS — N83209 Unspecified ovarian cyst, unspecified side: Secondary | ICD-10-CM | POA: Diagnosis not present

## 2013-04-21 DIAGNOSIS — I10 Essential (primary) hypertension: Secondary | ICD-10-CM | POA: Diagnosis not present

## 2013-04-21 DIAGNOSIS — M109 Gout, unspecified: Secondary | ICD-10-CM | POA: Diagnosis not present

## 2013-04-21 DIAGNOSIS — D7589 Other specified diseases of blood and blood-forming organs: Secondary | ICD-10-CM | POA: Diagnosis not present

## 2013-04-21 DIAGNOSIS — R269 Unspecified abnormalities of gait and mobility: Secondary | ICD-10-CM | POA: Diagnosis not present

## 2013-04-21 DIAGNOSIS — R7301 Impaired fasting glucose: Secondary | ICD-10-CM | POA: Diagnosis not present

## 2013-05-31 DIAGNOSIS — G8929 Other chronic pain: Secondary | ICD-10-CM | POA: Diagnosis not present

## 2013-05-31 DIAGNOSIS — I10 Essential (primary) hypertension: Secondary | ICD-10-CM | POA: Diagnosis not present

## 2013-06-29 DIAGNOSIS — L538 Other specified erythematous conditions: Secondary | ICD-10-CM | POA: Diagnosis not present

## 2013-06-29 DIAGNOSIS — L820 Inflamed seborrheic keratosis: Secondary | ICD-10-CM | POA: Diagnosis not present

## 2013-06-29 DIAGNOSIS — D179 Benign lipomatous neoplasm, unspecified: Secondary | ICD-10-CM | POA: Diagnosis not present

## 2013-06-29 DIAGNOSIS — L819 Disorder of pigmentation, unspecified: Secondary | ICD-10-CM | POA: Diagnosis not present

## 2013-07-20 DIAGNOSIS — N83209 Unspecified ovarian cyst, unspecified side: Secondary | ICD-10-CM | POA: Diagnosis not present

## 2013-07-25 DIAGNOSIS — G8929 Other chronic pain: Secondary | ICD-10-CM | POA: Diagnosis not present

## 2013-07-25 DIAGNOSIS — R82998 Other abnormal findings in urine: Secondary | ICD-10-CM | POA: Diagnosis not present

## 2013-07-25 DIAGNOSIS — M109 Gout, unspecified: Secondary | ICD-10-CM | POA: Diagnosis not present

## 2013-07-25 DIAGNOSIS — E669 Obesity, unspecified: Secondary | ICD-10-CM | POA: Diagnosis not present

## 2013-07-25 DIAGNOSIS — E785 Hyperlipidemia, unspecified: Secondary | ICD-10-CM | POA: Diagnosis not present

## 2013-07-25 DIAGNOSIS — I10 Essential (primary) hypertension: Secondary | ICD-10-CM | POA: Diagnosis not present

## 2013-08-03 DIAGNOSIS — R1032 Left lower quadrant pain: Secondary | ICD-10-CM | POA: Diagnosis not present

## 2013-08-03 DIAGNOSIS — M549 Dorsalgia, unspecified: Secondary | ICD-10-CM | POA: Diagnosis not present

## 2013-11-15 DIAGNOSIS — H43819 Vitreous degeneration, unspecified eye: Secondary | ICD-10-CM | POA: Diagnosis not present

## 2013-11-17 DIAGNOSIS — I10 Essential (primary) hypertension: Secondary | ICD-10-CM | POA: Diagnosis not present

## 2013-11-17 DIAGNOSIS — E785 Hyperlipidemia, unspecified: Secondary | ICD-10-CM | POA: Diagnosis not present

## 2014-01-03 ENCOUNTER — Other Ambulatory Visit: Payer: Self-pay | Admitting: Obstetrics and Gynecology

## 2014-01-03 DIAGNOSIS — N6310 Unspecified lump in the right breast, unspecified quadrant: Secondary | ICD-10-CM

## 2014-01-04 ENCOUNTER — Ambulatory Visit
Admission: RE | Admit: 2014-01-04 | Discharge: 2014-01-04 | Disposition: A | Discharge status: Home / Self Care | Payer: No Typology Code available for payment source | Source: Ambulatory Visit | Attending: Obstetrics and Gynecology | Admitting: Obstetrics and Gynecology

## 2014-01-04 ENCOUNTER — Ambulatory Visit
Admission: RE | Admit: 2014-01-04 | Discharge: 2014-01-04 | Disposition: A | Discharge status: Home / Self Care | Payer: Medicare Other | Source: Ambulatory Visit | Attending: Obstetrics and Gynecology | Admitting: Obstetrics and Gynecology

## 2014-01-04 DIAGNOSIS — R928 Other abnormal and inconclusive findings on diagnostic imaging of breast: Secondary | ICD-10-CM | POA: Diagnosis not present

## 2014-01-04 DIAGNOSIS — N6310 Unspecified lump in the right breast, unspecified quadrant: Secondary | ICD-10-CM

## 2014-01-04 DIAGNOSIS — N6459 Other signs and symptoms in breast: Secondary | ICD-10-CM | POA: Diagnosis not present

## 2014-01-16 DIAGNOSIS — Z23 Encounter for immunization: Secondary | ICD-10-CM | POA: Diagnosis not present

## 2014-03-19 DIAGNOSIS — G5601 Carpal tunnel syndrome, right upper limb: Secondary | ICD-10-CM | POA: Diagnosis not present

## 2014-03-19 DIAGNOSIS — M1632 Unilateral osteoarthritis resulting from hip dysplasia, left hip: Secondary | ICD-10-CM | POA: Diagnosis not present

## 2014-03-19 DIAGNOSIS — M25552 Pain in left hip: Secondary | ICD-10-CM | POA: Diagnosis not present

## 2014-03-19 DIAGNOSIS — M5416 Radiculopathy, lumbar region: Secondary | ICD-10-CM | POA: Diagnosis not present

## 2014-05-01 DIAGNOSIS — G5601 Carpal tunnel syndrome, right upper limb: Secondary | ICD-10-CM | POA: Diagnosis not present

## 2014-05-01 DIAGNOSIS — G5602 Carpal tunnel syndrome, left upper limb: Secondary | ICD-10-CM | POA: Diagnosis not present

## 2014-05-01 DIAGNOSIS — M47812 Spondylosis without myelopathy or radiculopathy, cervical region: Secondary | ICD-10-CM | POA: Diagnosis not present

## 2014-05-09 ENCOUNTER — Ambulatory Visit (INDEPENDENT_AMBULATORY_CARE_PROVIDER_SITE_OTHER): Payer: Self-pay | Admitting: Neurology

## 2014-05-09 ENCOUNTER — Encounter: Payer: Self-pay | Admitting: Neurology

## 2014-05-09 ENCOUNTER — Ambulatory Visit (INDEPENDENT_AMBULATORY_CARE_PROVIDER_SITE_OTHER): Payer: Medicare Other | Admitting: Neurology

## 2014-05-09 DIAGNOSIS — G5601 Carpal tunnel syndrome, right upper limb: Secondary | ICD-10-CM

## 2014-05-09 DIAGNOSIS — G5600 Carpal tunnel syndrome, unspecified upper limb: Secondary | ICD-10-CM

## 2014-05-09 HISTORY — DX: Carpal tunnel syndrome, unspecified upper limb: G56.00

## 2014-05-09 NOTE — Procedures (Signed)
     HISTORY:  Tamara Fleming is a 69 year old patient with a history of a prior left ulnar neuropathy requiring a transposition surgery. She has noted numbness and discomfort in the right hand and arm over the last one year. The patient also reports some neck pain and right shoulder pain. The patient is being evaluated for possible neuropathy or a cervical radiculopathy.  NERVE CONDUCTION STUDIES:  Nerve conduction studies were performed on the right upper extremity. The distal motor latency for the right median nerve was prolonged, with a normal motor amplitude. The distal motor latency and motor amplitudes for the right ulnar nerve were normal. The F wave latency for the right ulnar nerve was normal, borderline normal for the right median nerve. The nerve conduction velocities for the right median and ulnar nerves were normal. The right median sensory latency was prolonged, normal for the right ulnar nerve.  The patient refused nerve conduction study of the left upper extremity.  EMG STUDIES:  EMG study was performed on the right upper extremity:  The first dorsal interosseous muscle reveals 2 to 4 K units with full recruitment. No fibrillations or positive waves were noted. The abductor pollicis brevis muscle reveals 2 to 5 K units with slightly reduced recruitment. No fibrillations or positive waves were noted. The extensor indicis proprius muscle reveals 1 to 3 K units with full recruitment. No fibrillations or positive waves were noted. The pronator teres muscle reveals 2 to 3 K units with full recruitment. No fibrillations or positive waves were noted. The biceps muscle reveals 1 to 2 K units with full recruitment. No fibrillations or positive waves were noted. The triceps muscle reveals 2 to 4 K units with full recruitment. No fibrillations or positive waves were noted. The anterior deltoid muscle reveals 2 to 3 K units with full recruitment. No fibrillations or positive waves were  noted. The cervical paraspinal muscles were tested at 2 levels. No abnormalities of insertional activity were seen at either level tested. There was fair relaxation.   IMPRESSION:  Nerve conduction studies done on the right upper extremity reveals evidence of a mild right carpal tunnel syndrome. EMG evaluation of the right upper extremity shows no evidence of an overlying cervical radiculopathy. The patient refused study of the left upper extremity.  Jill Alexanders MD 05/09/2014 4:31 PM  [facility] [address] [place], [place] [address]  Phone [phone] Fax [phone]

## 2014-05-09 NOTE — Progress Notes (Signed)
Please refer to EMG and nerve conduction study report note.

## 2014-05-11 DIAGNOSIS — G894 Chronic pain syndrome: Secondary | ICD-10-CM | POA: Diagnosis not present

## 2014-05-11 DIAGNOSIS — Z79891 Long term (current) use of opiate analgesic: Secondary | ICD-10-CM | POA: Diagnosis not present

## 2014-05-16 DIAGNOSIS — E669 Obesity, unspecified: Secondary | ICD-10-CM | POA: Diagnosis not present

## 2014-05-16 DIAGNOSIS — G5601 Carpal tunnel syndrome, right upper limb: Secondary | ICD-10-CM | POA: Diagnosis not present

## 2014-05-16 DIAGNOSIS — R7301 Impaired fasting glucose: Secondary | ICD-10-CM | POA: Diagnosis not present

## 2014-05-16 DIAGNOSIS — E780 Pure hypercholesterolemia: Secondary | ICD-10-CM | POA: Diagnosis not present

## 2014-05-16 DIAGNOSIS — E785 Hyperlipidemia, unspecified: Secondary | ICD-10-CM | POA: Diagnosis not present

## 2014-05-16 DIAGNOSIS — I10 Essential (primary) hypertension: Secondary | ICD-10-CM | POA: Diagnosis not present

## 2014-05-16 DIAGNOSIS — G894 Chronic pain syndrome: Secondary | ICD-10-CM | POA: Diagnosis not present

## 2014-05-18 DIAGNOSIS — G5601 Carpal tunnel syndrome, right upper limb: Secondary | ICD-10-CM | POA: Diagnosis not present

## 2014-05-18 DIAGNOSIS — G5602 Carpal tunnel syndrome, left upper limb: Secondary | ICD-10-CM | POA: Diagnosis not present

## 2014-05-28 DIAGNOSIS — M109 Gout, unspecified: Secondary | ICD-10-CM | POA: Diagnosis not present

## 2014-06-04 DIAGNOSIS — B0223 Postherpetic polyneuropathy: Secondary | ICD-10-CM | POA: Diagnosis not present

## 2014-06-04 DIAGNOSIS — B029 Zoster without complications: Secondary | ICD-10-CM | POA: Diagnosis not present

## 2014-06-04 DIAGNOSIS — R52 Pain, unspecified: Secondary | ICD-10-CM | POA: Diagnosis not present

## 2014-06-15 DIAGNOSIS — B0223 Postherpetic polyneuropathy: Secondary | ICD-10-CM | POA: Diagnosis not present

## 2014-06-15 DIAGNOSIS — R52 Pain, unspecified: Secondary | ICD-10-CM | POA: Diagnosis not present

## 2014-06-22 ENCOUNTER — Ambulatory Visit: Payer: Self-pay

## 2014-06-22 DIAGNOSIS — R0602 Shortness of breath: Secondary | ICD-10-CM | POA: Diagnosis not present

## 2014-06-22 DIAGNOSIS — M7989 Other specified soft tissue disorders: Secondary | ICD-10-CM | POA: Diagnosis not present

## 2014-06-22 DIAGNOSIS — R609 Edema, unspecified: Secondary | ICD-10-CM | POA: Diagnosis not present

## 2014-07-11 DIAGNOSIS — M19071 Primary osteoarthritis, right ankle and foot: Secondary | ICD-10-CM | POA: Diagnosis not present

## 2014-07-11 DIAGNOSIS — M19072 Primary osteoarthritis, left ankle and foot: Secondary | ICD-10-CM | POA: Diagnosis not present

## 2014-07-11 DIAGNOSIS — M1A00X0 Idiopathic chronic gout, unspecified site, without tophus (tophi): Secondary | ICD-10-CM | POA: Diagnosis not present

## 2014-07-11 DIAGNOSIS — M1040 Other secondary gout, unspecified site: Secondary | ICD-10-CM | POA: Diagnosis not present

## 2014-07-11 DIAGNOSIS — M79672 Pain in left foot: Secondary | ICD-10-CM | POA: Diagnosis not present

## 2014-07-11 DIAGNOSIS — M25572 Pain in left ankle and joints of left foot: Secondary | ICD-10-CM | POA: Diagnosis not present

## 2014-07-19 DIAGNOSIS — G5601 Carpal tunnel syndrome, right upper limb: Secondary | ICD-10-CM | POA: Diagnosis not present

## 2014-07-27 NOTE — H&P (Signed)
PATIENT NAME:  Tamara Fleming, SCHRUPP MR#:  [id number] DATE OF BIRTH:  1946-01-02  DATE OF ADMISSION:  07/14/2012  PRIMARY CARE PHYSICIAN:  Guadalupe Maple, MD  CARDIOLOGIST:  Isaias Cowman, MD  CHIEF COMPLAINT:  Syncope.   HISTORY OF PRESENT ILLNESS:  This is a 69 year old female who has been battling a sinusitis/bronchitis for the last 3 weeks. She was started on an antibiotic starting with the letter "M" and cannot tell me the name. She was given a steroid shot and some pills. She took her last antibiotic the other day. Yesterday, she got her nails done. She sat up, everything went foggy and she passed out. She came to and then passed out again. EMS found her blood pressure to be low. She went to the Emergency Room. She was given IV fluids. She had testing done that was unremarkable. She felt better and she was discharged home. This morning, she had appointment with Dr. Saralyn Pilar of cardiology. She got up, she took a shower and she felt okay. She went into the kitchen, she put on her pants, then felt dizzy and weak and her husband caught her as she passed out and lowered her down to the ground. She was sweating. She has noticed some yellow tint to her vision. Yesterday, she did have some pain in the back along her bra strap which was helped with Ultram or pain medication. Hospitalist services were contacted for further evaluation for syncope on 2 days. In the ER, she was found to have a low blood pressure of 84/50 on presentation. She was also found to have an elevated creatinine of 1.26 and hospitalist services were contacted for further evaluation.   PAST MEDICAL HISTORY:  Hypertension, hyperlipidemia.   PAST SURGICAL HISTORY:  Hysterectomy, cyst on the breast 35 years ago, left elbow nerve release surgery.   ALLERGIES:  FLOXINS.   MEDICATIONS:  Include acetaminophen/hydrocodone 5/325 mg 1 tablet every 4 to 6 hours as needed for pain, Antivert 25 mg 3 times a day as needed for dizziness and  this was prescribed yesterday, aspirin 81 mg daily, atorvastatin 10 mg at bedtime, Diovan/HCT 80/12.5 mg 1 tablet daily, tramadol 50 mg every 4 hours as needed for pain that was prescribed yesterday.   SOCIAL HISTORY: No smoking. Does drink wine occasionally, but none in the last 3 weeks. No drug use. He has rental residential properties as income.   FAMILY HISTORY:  Mother died at 25 of heart disease, but first MI at age 50. Father died of lung cancer.   REVIEW OF SYSTEMS:  CONSTITUTIONAL: Positive for chills. Positive for sweats. Positive for weakness. Positive for weight loss, 8 pounds in 2 weeks.  EYES: Does have a yellow tint to her vision.  EARS, NOSE, MOUTH AND THROAT: pain and stopped up ears, positive for runny nose, positive for sore throat. No difficulty swallowing.  CARDIOVASCULAR: No chest pain. No palpitations.  RESPIRATORY: No shortness of breath. Positive for cough, whitish phlegm.  GASTROINTESTINAL: No nausea. No vomiting. No abdominal pain. Positive for constipation. No bright red blood per rectum. No melena.  GENITOURINARY: Increased urination. No hematuria.  MUSCULOSKELETAL: Positive for back pain.  INTEGUMENT: No rashes or eruptions.  NEUROLOGIC: No fainting or blackouts.  PSYCHIATRIC: No anxiety or depression.  ENDOCRINE: No thyroid problems.  HEMATOLOGIC/LYMPHATIC: No anemia.   PHYSICAL EXAMINATION: VITAL SIGNS: On presentation included a temperature of 98.7, pulse 71, respirations 18, blood pressure 84/50 and pulse oximetry 99% on room air.  GENERAL: No respiratory distress.  EYES: Conjunctivae and lids normal. Pupils equal, round, and reactive to light. Extraocular muscles intact. No nystagmus.  EARS, NOSE, MOUTH AND THROAT: Tympanic membrane on the right no erythema. Tympanic membrane on the left bulging and cloudy with some yellowish material beyond the tympanic membrane. Throat: No erythema, no exudate seen. Lips and gums no lesions.  NECK: No JVD. No bruits. No  lymphadenopathy. No thyromegaly. No thyroid nodules palpated.  RESPIRATORY: Lungs are clear to auscultation, although coarse breath sounds. No rhonchi, rales or wheeze heard.  CARDIOVASCULAR: S1, S2 normal. No gallops or rubs heard. A 2/6 systolic ejection murmur. Carotid upstroke 2+ bilaterally. No bruits. Dorsalis pulses 2+ bilaterally. No edema of the lower extremity.  ABDOMEN: Soft, nontender. No organosplenomegaly. Normoactive bowel sounds. No masses felt.  LYMPHATIC: No lymph nodes in the neck.  MUSCULOSKELETAL: No clubbing, edema, or cyanosis.  SKIN: No rashes or ulcers seen.  NEUROLOGIC: Cranial nerves II through XII grossly intact. Deep tendon reflexes 2+ bilateral lower extremities.  PSYCHIATRIC: The patient is oriented to person, place and time.   LABORATORY AND RADIOLOGICAL DATA:  Included urinalysis that showed 1+ leukocyte esterase. CT scan of the head showed no acute intracranial process, bilateral maxillary sinus disease. BNP is 30. CPK is 32. White blood cell count 6.6, H and H 13.6 and 40.1, platelet count 212. Glucose is 115, BUN 18, creatinine 1.26, sodium 136, potassium 3.5, chloride 101, CO2 is 28, calcium 8.5. Liver function tests normal. Troponin is negative. Urinalysis yesterday showed 3+ leukocyte esterase and 1+ blood. Chest x-ray yesterday showed mildly increased interstitial markings, may reflect interstitial edema of cardiac or noncardiac cause. No focal pneumonia. D-dimer yesterday was 0.5. Creatinine yesterday was 1.09. Troponin yesterday was negative. EKG today showed a normal sinus rhythm, 70 beats per minute and no acute ST-T wave changes.   ASSESSMENT AND PLAN:   1.  Syncope with orthostatic hypotension. We will give intravenous fluid hydration, hold Diovan/HCT. She did take it this morning. Continue to check orthostatics. The patient requests a cardiology consult since she was scheduled to see Dr. Saralyn Pilar today. I will put in a consult for [facility]. We will  admit to telemetry as an observation and get serial cardiac enzymes.  2.  Otitis media. and Possible urinary tract infection. We will give 1 dose of Unasyn once and then Augmentin b.i.d. and send off a urine culture.  3.  Hyperlipidemia. Continue Lipitor. We will continue to monitor overnight and follow up on how the patient does.   TIME SPENT ON ADMISSION:  55 minutes.    ____________________________ Tana Conch. Leslye Peer, MD rjw:si D: 07/14/2012 15:23:00 ET T: 07/14/2012 15:54:39 ET JOB#: [id number]  cc: Tana Conch. Leslye Peer, MD, <Dictator> Guadalupe Maple, MD Isaias Cowman, MD  Marisue Brooklyn MD ELECTRONICALLY SIGNED 07/16/2012 19:19

## 2014-07-27 NOTE — Consult Note (Signed)
Chief Complaint:  Subjective/Chief Complaint Looks and feels worse. Still with signif low abd pain/cramping. Diarrhea. No bleeding.   VITAL SIGNS/ANCILLARY NOTES: **Vital Signs.:   31-May-14 04:30  Vital Signs Type Routine  Temperature Temperature (F) 98.2  Temperature Source oral  Pulse Pulse 77  Respirations Respirations 18  Systolic BP Systolic BP 756  Diastolic BP (mmHg) Diastolic BP (mmHg) 69  Mean BP 85  Pulse Ox % Pulse Ox % 95  Pulse Ox Activity Level  At rest  Oxygen Delivery Room Air/ 21 %   Brief Assessment:  GEN Looks uncomfortable   Cardiac Regular   Respiratory clear BS   Gastrointestinal low abd tenderness   Lab Results: Routine Chem:  30-May-14 04:02   Glucose, Serum  112  BUN 11  Creatinine (comp) 0.96  Sodium, Serum 139  Potassium, Serum  3.2  Chloride, Serum 105  CO2, Serum 28  Calcium (Total), Serum  8.3  Anion Gap  6  Osmolality (calc) 278  eGFR (African American) >60  eGFR (Non-African American) >60 (eGFR values <57m/min/1.73 m2 may be an indication of chronic kidney disease (CKD). Calculated eGFR is useful in patients with stable renal function. The eGFR calculation will not be reliable in acutely ill patients when serum creatinine is changing rapidly. It is not useful in  patients on dialysis. The eGFR calculation may not be applicable to patients at the low and high extremes of body sizes, pregnant women, and vegetarians.)  Routine Hem:  30-May-14 04:02   WBC (CBC) 10.2  RBC (CBC)  3.52  Hemoglobin (CBC) 12.2  Hematocrit (CBC) 35.2  Platelet Count (CBC) 180  MCV 100  MCH  34.8  MCHC 34.8  RDW 13.4  Neutrophil % 67.3  Lymphocyte % 19.8  Monocyte % 11.2  Eosinophil % 1.4  Basophil % 0.3  Neutrophil #  6.9  Lymphocyte # 2.0  Monocyte #  1.1  Eosinophil # 0.1  Basophil # 0.0 (Result(s) reported on 02 Sep 2012 at 04:42AM.)   Assessment/Plan:  Assessment/Plan:  Assessment Diverticulitis on Abx. No better this AM.   Plan  Will change diet to clear liquid diet for rest of today and see sxs improve.   Electronic Signatures: OVerdie Shire(MD)  (Signed 31-May-14 10:24)  Authored: Chief Complaint, VITAL SIGNS/ANCILLARY NOTES, Brief Assessment, Lab Results, Assessment/Plan   Last Updated: 31-May-14 10:24 by OVerdie Shire(MD)

## 2014-07-27 NOTE — Consult Note (Signed)
Chief Complaint:  Subjective/Chief Complaint Less nausea and abd cramping since switching to clear liquid diet.   VITAL SIGNS/ANCILLARY NOTES: **Vital Signs.:   01-Jun-14 04:00  Vital Signs Type Routine  Temperature Temperature (F) 98  Celsius 36.6  Pulse Pulse 70  Respirations Respirations 18  Systolic BP Systolic BP 956  Diastolic BP (mmHg) Diastolic BP (mmHg) 69  Mean BP 85  Pulse Ox % Pulse Ox % 94  Pulse Ox Activity Level  At rest  Oxygen Delivery Room Air/ 21 %   Brief Assessment:  GEN no acute distress   Cardiac Regular   Respiratory clear BS   Gastrointestinal less tender than yesterday   Lab Results: Routine Hem:  01-Jun-14 04:14   WBC (CBC) 7.8  RBC (CBC)  3.19  Hemoglobin (CBC)  11.1  Hematocrit (CBC)  32.0  Platelet Count (CBC) 197  MCV 100  MCH  34.9  MCHC 34.8  RDW 13.0  Neutrophil % 61.1  Lymphocyte % 26.4  Monocyte % 9.3  Eosinophil % 2.7  Basophil % 0.5  Neutrophil # 4.7  Lymphocyte # 2.0  Monocyte # 0.7  Eosinophil # 0.2  Basophil # 0.0 (Result(s) reported on 04 Sep 2012 at 05:56AM.)   Radiology Results: [place]:    29-May-14 21:44, US Pelvis Ultrasound Exam with Transvaginal - NON-OB  US Pelvis Ultrasound Exam with Transvaginal - NON-OB   REASON FOR EXAM:    bilateral ovarian cyst on post menopausal woman.  COMMENTS:       PROCEDURE: [place]  - US PELVIS EXAM W/TRANSVAGINAL  - Sep 01 2012  9:44PM     RESULT:     Technique: Ultrasound examination of the pelvis was obtained with   transabdominal and transvaginal technique.     Findings: The patient is status post prior hysterectomy. The right ovary   measures 2.6 x 1.6 cm x 1.7 cm and the left 2.8 x 1.8 x 3.3 cm. There is   a right hypoechoic cyst measuring 1.9 x 1.6 x 1.5 cm. The left ovary also   demonstrates a hypoechoic cyst measuring 2.5 x 1.8 x 1.5 cm. Both ovaries   are normal in size and display color Doppler vascularity.  IMPRESSION:      1. Prior hysterectomy noted.   2. There  is no evidence of ovarian torsion.  3. Nonspecific bilateral ovarian cysts as described above. These findings   can be monitored if and as clinically warranted. Further evaluation with   GYN consultation recommended if clinically warranted.     A preliminary faxed report was relayed to the patient's floor on   09/01/2012 at 10:22 p.m. ET.    Thank you for the opportunity to contribute to the care of your patient.         Verified By: Mikki Santee, M.D., MD  CT:    29-May-14 18:11, CT Abdomen and Pelvis Without Contrast  CT Abdomen and Pelvis Without Contrast   REASON FOR EXAM:    (1) llq pain; (2) llq pain;    NOTE: Nursing to Give   Oral CT Contrast  COMMENTS:       PROCEDURE: CT  - CT ABDOMEN AND PELVIS W0  - Sep 01 2012  6:11PM     RESULT: CT abdomen and pelvis is dated 09/01/2012 comparison made to prior   study dated 11/07/2010    Technique: Helical noncontrasted 3 mm sections were obtained from the   lung bases through the pubic symphysis status post administration  contrast.    Findings: Hypoventilation is appreciated within the lung bases.  Noncontrasted evaluation of the liver, spleen, adrenals, pancreas,   kidneys are unremarkable. A small hiatal hernia is identified. There is   thickening of the distal esophageal wall which may reflect the sequela of   chronic gastroesophageal reflux. There is no evidence of abdominal free   fluid, loculated fluid collections, pathologic sized adenopathy, nor   masses.    There is diffuse bowel wall thickening and diverticulosis within the   sigmoid colon. There is stranding within the pericolonic fat as well as a   small amount of free fluid. A stable the priest described 1.5 cm cystic   structure and the left adnexa is slightly more prominent when compared to   previous study now measuring 2.37 cm by 1.18. Similar findings are also   again appreciated on the right which are again more prominent. Clinical   correlation  recommended and possibly GYN consultation. There is no   evidence of abdominal or pelvic loculated fluid collections nor     pneumoperitoneum.    IMPRESSION:  Findings consistent with diverticulitis involving the   sigmoid colon.  2. Bilateral cystic structures in the adnexal regions like reflecting   ovarian cyst though considering the patient's age GYN consultation is   recommended.  3. Repeat surveillance evaluation is recommended to ensure resolution of   the sigmoid colon findings status post appropriate therapeutic regiment.        Verified By: Mikki Santee, M.D., MD   Assessment/Plan:  Assessment/Plan:  Assessment Diverticulitis. Slowly improving.   Plan Continue Abx. Keep on liquid diet rest of today. May advance diet gradually tomorrow if sxs continues to improve. Thanks.   Electronic Signatures: Verdie Shire (MD)  (Signed 05-Jun-14 16:01)  Authored: Chief Complaint, VITAL SIGNS/ANCILLARY NOTES, Brief Assessment, Lab Results, Radiology Results, Assessment/Plan   Last Updated: 05-Jun-14 16:01 by Verdie Shire (MD)

## 2014-07-27 NOTE — Consult Note (Signed)
PATIENT NAME:  Tamara, Fleming MR#:  [id number] DATE OF BIRTH:  1945/12/18  DATE OF CONSULTATION:  09/02/2012  REFERRING PHYSICIAN:  Fabens Sink, MD CONSULTING PHYSICIAN:  Verdie Shire, MD / Payton Emerald, NP  PRIMARY CARE PHYSICIAN:  [name], MD  PRIMARY CARDIOLOGIST:  Isaias Cowman, MD  CHIEF COMPLAINT: Acute diverticulitis.  HISTORY OF PRESENT ILLNESS: Tamara Fleming is a 69 year old Caucasian female with significant medical history of hypertension, hyperlipidemia, heart murmur and diverticulosis diagnosed with acute diverticulitis in August of 2012, hospitalized for 1 week in length. She also sustained an allergic reaction at that time to CIPROFLOXACIN. The patient presented to [facility] Emergency Room yesterday at around 2:00 p.m. after an acute onset of Wednesday evening of just not feeling like herself, very tired, fatigued. Awoke at midnight that night with abdominal discomfort across the entire lower abdomen, more predominant to the left lower quadrant. Pain became persistent and constant and sharp in nature. No associated nausea, no vomiting. She does not have a normal bowel pattern. States she will fluctuate between diarrhea, regular movement of every day to constipation at times. She did have diarrhea earlier in the week, but so did her husband and thought it was possibly an infectious process at that point. She has had a low-grade fever of 99.5 at home for the past 2 days. Last bowel movement was 2 days ago. No rectal bleeding. No melena. History of colonoscopy performed 5 years ago by [place], [place], [place], with [facility]. Polyps were resected and retrieved at that time. I do not have a copy of report to review or pathology. EGD was also done which was unremarkable at that point in time.   When the patient was seen in the Emergency Room, she complained of feeling very dizzy.  Her blood pressure dropped to 72/50. She was found to be  tachycardic at that time with a pulse of 103.  She did receive 1 liter of fluids and blood pressure started to rise and more normotensive state. Her pain was a 7 on a scale of 1 to 10 in the Emergency Room. Since she has been hospitalized, her pain is slightly improving. Still states that it is pretty predominant to left lower quadrant. CT scan of abdomen and pelvis was done without contrast yesterday evening at approximately 6:00. Revealed diffuse bowel wall thickening and diverticulosis in the sigmoid colon, stranding with pericolonic fat as well as a small amount of free fluid. Stable prior cystic structure in left adnexa, slightly more prominent when compared to prior study, is now reading 2.7.  On CT scan in 2012 is noted to enlarge.  Now measuring 2.37 cm x 1.18. Pelvic ultrasound was done as well yesterday evening which showed prior evidence of hysterectomy, no evidence of ovarian torsion, nonspecific bilateral ovarian cysts being present. Do warrant gynecological evaluation at some point.   Chemistry panel on admission:  Glucose was 141.  EGFR was 52.  Glucose is improved today to 112. Potassium has declined from 3.6 to 3.2. Anion gap has declined from 9 to 6 and calcium on admission 9.0 down to 8.3. Hepatic panel on admission: Total bilirubin was slightly elevated at 1.1, otherwise within normal limits. Troponin was less than 0.02. CBC: WBC count was 12.2, which has normalized to 10.2 since antibiotic therapy has been initiated. Otherwise, essentially within normal limits, except neutrophil number was elevated at 9.2 and also has improved to 6.9. Blood cultures x 2: No evidence of growth in 8 to  12 hours. Urinalysis essentially unremarkable. Negative for nitrites and leukocytes.   EKG: Normal sinus rhythm.  PAST MEDICAL HISTORY: Hypertension, hyperlipidemia, heart murmur, diverticulosis/acute diverticulitis August 2012.   PAST SURGICAL HISTORY: Cyst excision, benign, from breast, left elbow nerve  release surgery and hysterectomy in 1982.   FAMILY HISTORY: Positive for coronary artery disease. Mother sustained a massive heart attack at age of 52 and subsequent heart attack at the age of 81. Father lung cancer, throat cancer, smoker. No colon cancer. Father history of colonic polyps. No history of IBD, diabetes or hypertension.   SOCIAL HISTORY: No tobacco, 1 glass of wine a day, married.   HOME MEDICATIONS:  1.  Diovan 80/12.5 mg once a day. 2.  Lipitor 10 mg a day. 3.  Aspirin 81 mg a day. 4.  Vicodin as directed as needed.   ALLERGIES: CIPROFLOXACIN AND LEVOFLOXACIN.  REVIEW OF SYSTEMS: CONSTITUTIONAL: Significant for fever, low-grade at home, fatigue, weakness, lack of energy. No weight loss, weight gain.  EYES: No blurred vision, double vision, although feeling of passing out. Vision did become obscured during the Emergency Room.  ENT: No tinnitus. No ear pain. No postnasal drip. No difficulty swallowing.  RESPIRATORY: No coughing, no wheezing.  CARDIOVASCULAR: No chest pain, heart palpitations at this time, but positive for palpitations 2 days prior to admission.  GASTROINTESTINAL: See HPI. GENITOURINARY: No dysuria or hematuria.  ENDOCRINE: No polyuria, polydipsia, polyphagia, heat or cold intolerance.  SKIN: No rashes, petechiae. MUSCULOSKELETAL: No arthralgias or myalgias.  NEUROLOGIC: No history of CVA, seizure disorder.  PSYCH: No depression.  No anxiety.   PHYSICAL EXAMINATION: VITAL SIGNS: Temperature is 98.6, pulse 77, respirations 18, blood pressure 108/63 and pulse ox is 96% on room air.  GENERAL: Well developed, overweight 69 year old Caucasian female, no acute distress noted. Pleasant.  HEENT: Normocephalic, atraumatic. Pupils equal and reactive to light. Conjunctiva clear. Sclerae anicteric.  NECK: Supple. Trachea midline. No  lymphadenopathy or thyromegaly.  LUNGS:  Symmetric rise and fall of chest. Clear to auscultation throughout.  HEART:  Regular rate  and rhythm, S1 and S2. No audible murmur.  ABDOMEN: Soft, nondistended. Bowel sounds in 4 quadrants. Marked discomfort left lower quadrant. No bruits. No masses.  GENITOURINARY: Bladder nondistended.  MUSCULOSKELETAL: Gait not assessed.  EXTREMITIES: No edema.  PSYCH: Alert and oriented x 4. Memory grossly intact.  NEUROLOGICAL: No gross neurological deficits.   LABORATORY AND DIAGNOSTICS: Findings as stated under history.   IMPRESSION: Acute diverticulitis involving sigmoid colon, personal history of colonic polyps, hypokalemia and hypertension, which has been corrected.   PLAN: The patient's presentation was discussed with Dr. Verdie Shire. We will continue to monitor the patient's progress and how she improves with antibiotic therapy, to continue with Zosyn IV as ordered. Remain on low residue diet. Recommend correction of hypokalemia.  Surgical evaluation if condition does not improve.  The patient is scheduled for colonoscopy next Wednesday with her primary GI MD, Dr. Michail Sermon, [place], [place]. The patient is going to cancel colonoscopy at this time.  She will decide at the time of discharge who she wishes to follow up with as a diagnostic colonoscopy is warranted based on these findings as well as her previous GI history.   These services provided by Payton Emerald, MS, APRN, [facility], FNP under collaborative agreement with Verdie Shire, MD. ____________________________ Payton Emerald, NP dsh:sb D: 09/02/2012 12:53:46 ET T: 09/02/2012 13:44:45 ET JOB#: [id number]  cc: Payton Emerald, NP, <Dictator> DAWN Ruthe Mannan MD ELECTRONICALLY SIGNED  09/02/2012 17:46

## 2014-07-27 NOTE — H&P (Signed)
PATIENT NAME:  Tamara Fleming, Tamara Fleming MR#:  [id number] DATE OF BIRTH:  [birth year]  DATE OF ADMISSION:  09/01/2012  REFERRING PHYSICIAN:  Dr. Lisa Fleming  CHIEF COMPLAINT:  Abdominal pain, dizziness, lightheadedness and near-syncope.   PRIMARY CARE PHYSICIAN:  Tamara Fleming   CARDIOLOGIST:  Tamara Fleming  HISTORY OF PRESENT ILLNESS:  Tamara Fleming is a very nice, 69 year old female, who has history of hypertension, hyperlipidemia, murmur and diverticulosis, with history of previous diverticulitis.   The patient comes today with a history of feeling very tired yesterday, not her normal self. Prior to that, she was not having any symptoms, but in the hours of the evening, she started getting really achy, sweaty, with occasional chills, and had absolutely no energy.   Around midnight, she started developing some left lower quadrant pain. She states that the pain was exactly the same as it was in 2012 when she had diverticulitis. For that reason, she went to her primary care physician, Tamara Fleming, who did some blood work, and based on the history of her allergies and her condition, he recommended to come to the hospital. At that moment, her blood pressure was 138/80, and her pulse was 92. Her abdominal pain was mostly located on suprapubic area and left lower quadrant. No vomiting, no nausea, no diarrhea or blood in her stool. The onset of the pain was very severe, 7 out of 10, a sudden onset. The patient described it as a big cramp, was occasionally sharp. There was no radiation of the pain, and it was constant.    The patient states that there was nothing that was making it feel better, and she was not able to eat anything. She was able to drink some fluids, and she tried to keep herself hydrated. She states that she has been having fevers, although that has not been documented.   The patient was evaluated in the ER. By the time that she came over here, her blood pressure dropped to 72/50. The patient  was really dizzy, lightheaded, tachycardic, with a pulse of 103. Then she received 1 liter of fluids and her blood pressure is starting to bump up.    CT scan of the belly was done, showing diverticulitis. Patient admitted for further treatment.   The patient says that she has been getting dizzy and lightheaded, having sweats and chills. Her last bowel movement was yesterday, and was normal. No diarrhea. No significant nausea or vomiting. No chest pain or shortness of breath. The patient has been having palpitations for the past 2 days.   REVIEW OF SYSTEMS: CONSTITUTIONAL: Subjective fever. Positive fatigue. Positive weakness. Positive lack of energy. No weight loss or weight gain.  EYES: No blurry vision, double vision up until this afternoon. Before she almost passed out, her vision became all black and blurry.  EARS, NOSE, THROAT:  No tinnitus. No ear pain. No postnasal drip. No difficulty swallowing.  RESPIRATORY:  No cough. No wheezing. No hemoptysis. No painful respirations.  CARDIOVASCULAR: No chest pain, orthopnea, edema, arrhythmias or syncope. Positive palpitations for 2 days due to tachycardia.  GASTROINTESTINAL:  No nausea, vomiting at this moment, Positive abdominal pain of the lower quadrant, as mentioned above. No rectal bleeding. No melena. No hematemesis. No hematochezia.  GENITOURINARY: No dysuria, hematuria, but patient says that she has been urinating more frequently.  GYNECOLOGIC:  No breast masses or breast tenderness.  ENDOCRINOLOGY: No polyuria, polydipsia, polyphagia, cold or heat intolerance. HEMATOLOGIC/LYMPHATIC: No anemia, easy bruising, bleeding or swollen  glands.  SKIN:  No rashes or petechiae. No changes in moles.  MUSCULOSKELETAL: No significant neck pain, back pain, hip pain or gout.  NEUROLOGIC:  No numbness, tingling. No CVA. No ataxia. No vertigo.  PSYCHIATRIC:  No significant anxiety or depression.   PAST MEDICAL HISTORY: 1.  Hypertension.  2.   Hyperlipidemia.  3.  Heart murmur.  4.  Diverticulosis/diverticulitis in the past.   PAST SURGICAL HISTORY:  1.  Cyst on the breast, benign.  2.  Left elbow nerve release surgery.  3.  Hysterectomy.   ALLERGIES: CIPROFLOXACIN and LEVOFLOXACIN.   MEDICATIONS:  Diovan 80/12.5 mg once daily, Lipitor 10 mg once daily, aspirin 81 mg once a day, Vicodin p.r.n. pain.   SOCIAL HISTORY: The patient denies smoking. She has never smoked. She drinks wine, but very seldom. She does not use any drugs. She has an income as rental of residential properties. She lives with her husband. Her husband is on a trip playing golf outside the state. The patient is alone at home at this moment.   FAMILY HISTORY:  Positive coronary artery disease. A massive heart attack in her mom, first one at the age of 76, and she died from another heart attack at the age of 60. Her father had lung cancer. No diabetes. No hypertension.   PHYSICAL EXAMINATION: VITAL SIGNS:  As mentioned above, blood pressure as low as 72/50, pulse 103, temperature 98.8, oxygen saturation 95% on room air, respirations 18. Her blood pressure has come up to 133/64, heart rate 90, respiratory rate 20 after 1 liter of fluid.   GENERAL:  The patient looks alert and oriented x 3. No acute distress. No respiratory distress. Well-nourished. HEENT:  Head normocephalic, atraumatic. Eyes: No icterus. Pupils are equal and reactive. Extraocular movements are intact. Mucosa are moist. Anicteric sclerae. Pink conjunctivae. Nose without any lesions or drainage. Ears without external lesions.  NECK:  Supple. No JVD. No thyromegaly. No adenopathy No masses. No rigidity.  CARDIOVASCULAR:  Regular rate and rhythm. No murmurs appreciated. The patient states that she had a murmur, although I cannot hear it at this moment. She is slightly tachycardic. No displacement of PMI. No tenderness to palpation anterior chest wall.  LUNGS:  Clear, without any wheezing or crepitus.  Good respiratory effort. No use of accessory muscles.  ABDOMEN: Soft. It is nontender on epigastric and upper quadrants, but patient has significant tenderness at the level of both lower quadrants, left more than right. No rebound. No guarding. Bowel sounds are positive. Tenderness goes along the curvature of the intestinal wall at the level of the left lower quadrant and flank.  GENITOURINARY:  Deferred.  MUSCULOSKELETAL:  No significant edema, cyanosis or clubbing. Joints: No effusions. Normal range of motion. Normal strength.  SKIN:  Without any rashes or petechiae. The patient looks well-hydrated. No diaphoresis.  LYMPHATICS:  Negative for lymphadenopathy in neck or supraclavicular areas.  VASCULAR:  Good pulses palpable. Capillary refill less than 3 seconds.  NEUROLOGIC:  Cranial nerves II through XII intact. DTRs +2, symmetrical. Strength is 5/5 in all 4 extremities. Sensation is normal on distal extremities.  PSYCHIATRIC: Judgment is appropriate. Alert, oriented x 3. No signs of significant agitation.   RESULTS:  Glucose 141, creatinine 1.1, BUN 14, potassium 3.6, chloride 103, calcium 9. Total protein 7.8. LFTs within normal limits, with slight elevation of bilirubin of 1.1. Troponin is 0.02. White blood count 12.2, hemoglobin 12.7, platelets 213. UA:  No bacteria, no white blood cells, negative  nitrites or leukocyte esterase. CT scan of the abdomen showed diverticulitis involving the sigmoid colon, bilateral cyst structures in adnexal regions, likely reflecting ovarian cyst.   ASSESSMENT AND PLAN: A 69 year old female with hypertension, hyperlipidemia, diverticulosis, comes with an episode of acute diverticulitis, hypotension and presyncope.   1.  Systemic inflammatory response syndrome. The patient is tachycardic, with elevation of white blood cells, more than 12,000, tachycardia more than 100, hypotensive. The patient is admitted for evaluation of this. She is toxic culture and antibiotics  broad-spectrum started. Likely this is results of diverticulitis.   2. Hypotension due to diverticulitis, septic process, intravascular volume depletion due to possible dehydration. No significant signs of third spacing 1 liter of fluid made the blood pressure resolve, for what I am going to leave her on some IV fluids. Hold blood pressure medications tonight. Start antibiotics.   3.  Diverticulitis. The patient started on Zosyn, as the patient is allergic to Cipro, and there is a lack of metronidazole in the hospital, anyway. Monitor white blood cells, monitor treatment. Gastrointestinal consult, as the patient has recurrent diverticulitis. Possible colonoscopy in the near future.   4.  Ovarian cyst. The patient has bilateral ovarian cysts. At her age, they might not be a normal finding. We are going to add an ultrasound of the pelvis, and depending on the results, consul Gynecologic here in house or outpatient.   5.  Hypotension:  Hold blood pressure medications.   6.  Hyperlipidemia. Continue Lipitor.   7.  Deep vein thrombosis prophylaxis with heparin.   8.  Gastrointestinal prophylaxis with Protonix.   9.  The patient is a FULL CODE.   I spent about 45 minutes with this patient.    ____________________________ Sibley Sink, MD rsg:mr D: 09/01/2012 20:25:28 ET T: 09/01/2012 20:57:01 ET JOB#: [id number]  cc: [place] Sink, MD, <Dictator> Cornelious Diven America Brown MD ELECTRONICALLY SIGNED 09/12/2012 1:54

## 2014-07-27 NOTE — Consult Note (Signed)
Brief Consult Note: Diagnosis: Acute diverticulitis.  Personal history of colonic polyps. Hypokalemia. Hypotension.   Consult note dictated.   Discussed with Attending MD.   Comments: Patient's presentation discussed with Dr. Verdie Shire. Will continue to monitor patient's progress and how she improves with antibiotic therapy.  Continue with Zosyn IV as ordered.  Remain on low residue diet.  Recommend correction of hypokalemia.  Surgical evaluation is condition does not seem to improve.  She is already scheduled for colonoscopy next Wednesday with her primary GI MD, Dr. Michail Sermon, [place], [place]. Patient is going to cancel at this time.  She will decide at the time of discharge who she wishes to follow-up with to allow continued GI care.  Electronic Signatures: Payton Emerald (NP)  (Signed 30-May-14 12:40)  Authored: Brief Consult Note   Last Updated: 30-May-14 12:40 by Payton Emerald (NP)

## 2014-07-27 NOTE — Consult Note (Signed)
PATIENT NAME:  Tamara Fleming, FREES MR#:  [id number] DATE OF BIRTH:  1946-03-03  DATE OF CONSULTATION:  07/14/2012  REFERRING PHYSICIAN:  Loletha Grayer, MD CONSULTING PHYSICIAN:  Dwayne D. Clayborn Bigness, MD  PRIMARY CARE PHYSICIAN:  [name], MD  CARDIOLOGIST:  Isaias Cowman, MD  INDICATION: Syncope.   HISTORY OF PRESENT ILLNESS: Ms. Ascher is a 69 year old white female with recent history of sinusitis and bronchitis for the last 3 weeks. She started taking antibiotics and has had some trouble with congestion and cough and she has gotten steroid shots and pill.  She last took them 2 days ago. Yesterday she was getting her nails done and had an episode of near syncope and essentially passed out, felt foggy for a few seconds.  She went home and was found to have a blood pressure that was low when she was seen by the Emergency Room, but at home she had had a second episode and then the following day she had another episode and came to the Emergency Room and was given IV fluids and had testing done that was unremarkable. She felt better and was discharged home. The morning of admission, she had what sounded like a third episode. She had an appointment with Dr. Saralyn Pilar, in cardiology.  When she got up and took a shower she felt okay but then when she went to the kitchen she felt dizzy and weak and her husband caught her before she passed out and lowered her down to the ground and she was sweating. Noticed some yellow tint in her vision. She did have some pain in her back along her breasts for which she took some Ultram. She was finally admitted for syncope, 3 episodes in 2 days. No palpitations or tachycardia. Her blood pressure has been running 84/50. On admission creatinine was slightly elevated. Denies previous cardiac issues.  Had made an appointment with [facility] before she passed out, but was not able to keep the appointment because she went to the Emergency Room.   REVIEW OF SYSTEMS:  She has  had blackout spells, syncope, x 3. No fever, chills or sweats. No weight loss. No weight gain. No hemoptysis or hematemesis. No bright red blood per rectum.  No vision change or hearing change.  Denies sputum production. Denies cough. She had had hypotension as well, weakness and fatigue.   PAST SURGICAL HISTORY: 1.  Hysterectomy.  2.  Cyst on her breast. 3.  Elbow nerve release, of the ulnar.  ALLERGIES: FLOXIN.   MEDICATIONS: 1.  Acetaminophen/hydrocodone. 2.  Antivert 25 mg 3 times a day for dizziness, which was prescribed yesterday.  3.  Aspirin 81 mg a day. 4.  Atorvastatin 10 mg at bedtime.  5.  Diovan/HCTZ 80/12.5 mg.  6.  Tramadol 50 mg p.r.n.   FAMILY HISTORY: Mother died of heart disease at 94; MI, cancer.   SOCIAL HISTORY: No smoking or alcohol consumption   PHYSICAL EXAMINATION: VITAL SIGNS: Blood pressure again was low under 90.  Lowest was 85/50 on admission. Pulse 75, respiratory rate 16 and afebrile.  HEENT: Normocephalic, atraumatic. Pupils equal and reactive to light.  NECK: Supple. No significant JVD, bruits or adenopathy.  LUNGS: Clear to auscultation and percussion.  No significant wheezing, rhonchi, or rale.  HEART: Regular rate and rhythm. No significant murmur. No gallops or rubs.  ABDOMEN: Benign.  EXTREMITIES: Within normal limits.  NEUROLOGIC: Grossly intact.  SKIN: Normal.   LABORATORY AND DIAGNOSTICS:  Urinalysis was unremarkable. BNP 30. CK was normal. White  count 6.6, H and H 13.6 and 40 and platelet count 212.  Glucose 115, BUN 18, creatinine 1.26, sodium 136 and potassium 2.5. LFTs normal. Troponin was normal. Again, UA  yesterday showed 3+ leukocytes.  The creatinine was much lower yesterday at 1.09.   EKG: Normal sinus rhythm, rate of 70, nonspecific ST-T wave changes.  ASSESSMENT: 1.  Syncope. 2.  Orthostatic hypotension. 3.  Possible otitis media. 4.  Hyperlipidemia. 5.  Obesity. 6.  Weakness. 7.  Mild hypokalemia.   PLAN:  Agree with  admit. Fluid resuscitation for possible dehydration. Follow up for syncope. Carotid Dopplers. Echocardiogram. Telemetry. Rule out for myocardial infarction. Follow up EKGs. Will treat medically for now. I do not believe this is cardiac in nature. It may be orthostatic hypotension or dehydration. So I think conservative cardiac studies and therapy is indicated for now until the patient responds. She is on Lipitor for lipid management. Urinary tract infection will continue to be treated.  There is no evidence of sepsis, but again hopefully her work-up will be negative and will have the patient referred to Dr. Saralyn Pilar as outpatient for further evaluation and management.  ____________________________ Loran Senters. Clayborn Bigness, MD ddc:sb D: 07/15/2012 13:16:00 ET T: 07/15/2012 13:37:16 ET JOB#: [id number]  cc: Dwayne D. Clayborn Bigness, MD, <Dictator> Yolonda Kida MD ELECTRONICALLY SIGNED 08/17/2012 10:13

## 2014-07-27 NOTE — Discharge Summary (Signed)
PATIENT NAME:  Tamara Fleming, Tamara Fleming MR#:  [id number] DATE OF BIRTH:  May 23, 1945  DATE OF ADMISSION:  07/14/2012 DATE OF DISCHARGE:  07/15/2012  PRIMARY CARE PHYSICIAN:  Kathrine Haddock, MD  FINAL DIAGNOSES: 1.  Syncope orthostatic hypotension.  2.  Back pain.  3.  Hyperlipidemia.  4.  Otitis media.   MEDICATIONS ON DISCHARGE: Atorvastatin 10 mg at bedtime, aspirin 81 mg daily, acetaminophen hydrocodone 325/5, 1 tablet every 4 to 6 hours as needed for pain, Augmentin 875 mg twice a day dyspnea on exertion 9 more days.   DIET: Regular consistency low sodium.   ACTIVITY: As tolerated.   FOLLOW UP:  Follow-up with Dr. Kathrine Haddock next week.   REASON FOR ADMISSION: The patient was admitted 07/14/2012 and discharged 07/15/2012, admitted as an observation for syncope.   HISTORY OF PRESENT ILLNESS: This is a 69 year old female who has been battling sinusitis and bronchitis for the last 3 weeks. She was on an antibiotic and steroid. She got her nails done, she sat up and went foggy and then passed out. She came to and then passed out again. EMS found her blood pressure to be low.  She was given IV fluids in the Emergency Room, she felt better and was discharged home.  The next morning, she felt okay initially, took a shower and then put on her pants and passed out again.  She was sweating.  Blood pressure was found to be low again at 84/50 on presentation, with an elevated creatinine of 1.26. The patient was admitted with syncope, orthostatic hypotension. She was given IV fluid hydration.  Her Diovan/HCT was stopped. She did have an appointment with Dr. Saralyn Pilar on the day of admission and she requested cardiology consultation. For the patient's otitis media and possible urinary tract infection, the patient was given a dose of Unasyn and then was started on Augmentin.  For the patient's hyperlipidemia she was kept on Lipitor.   LABORATORY AND DIAGNOSTIC DATA DURING THE HOSPITAL COURSE:  included cardiac  enzymes that were negative. Glucose 115, BUN 18, creatinine 1.26, sodium 136, potassium 3.5, chloride 101, CO2 28, calcium 8.5. Liver function tests normal. White blood cell count 6.6, hemoglobin and hematocrit 13.6 and 40.1, platelet count 212. BNP 30. CT scan of the head without contrast showed no acute intracranial process, bilateral maxillary sinus disease. Urine culture colonies too small to read. LDL 51, HDL 35, triglycerides 116. Cardiac enzymes x 3 were negative. Repeat creatinine was 0.84. Echocardiogram showed normal ejection fraction 50% to 60%,  mildly dilated left atrium.  Myocardial perfusion scan showed no evidence of pathology and has a normal appearance.  HOSPITAL COURSE PER PROBLEM LIST:  1.  For the patient's syncope and orthostatic hypotension. I think this was partially dehydration and hypotension, from continuing to take the Diovan/HCT. She was given IV fluid hydration. Blood pressure upon discharge was 118/75. I recommended continuing to stay off of the Diovan/HCT at this point and follow up with her physician in 1 to 2 weeks.  I would rather have her blood pressure on the higher side; with the hydration she was no longer orthostatic.  2.  For the patient's back pain she did have a troponin that d-dimer that was 0.5. I think her back pain is likely reflective to all the bronchitis and coughing that she had recently. I did not want to prescribe any steroids for this. I did give some p.r.n. pain medication which seemed to help.  3.  For her hyperlipidemia, she  was kept on her Lipitor.  4.  For her otitis media. She did get a dose of Unasyn and I will give her Augmentin to go home with to complete the course. There was no urinary tract infection.   Time spent on discharge: 35 minutes.     ____________________________ Tana Conch. Leslye Peer, MD rjw:ct D: 07/15/2012 17:34:51 ET T: 07/16/2012 13:26:28 ET JOB#: [id number]  cc: Tana Conch. Leslye Peer, MD, <Dictator> Cheryl A. Julian Hy,  NP Marisue Brooklyn MD ELECTRONICALLY SIGNED 07/16/2012 19:22

## 2014-07-27 NOTE — Consult Note (Signed)
Pt seen and examined. Please see Tamara Fleming's notes. Feeling better but still has low abd pain/cramping. 2nd bout of diverticulitis. Had reaction to cipro in 2012. Now on zosyn. Agree with postponing repeat colonoscopy until diverticulitis clears. Will follow over the weekend. Thanks.  Electronic Signatures: Verdie Shire (MD)  (Signed on 30-May-14 13:16)  Authored  Last Updated: 30-May-14 13:16 by Verdie Shire (MD)

## 2014-07-27 NOTE — Discharge Summary (Signed)
PATIENT NAME:  Tamara Fleming, Tamara Fleming MR#:  [id number] DATE OF BIRTH:  01-27-1946  DATE OF ADMISSION:  09/01/2012 DATE OF DISCHARGE:   09/05/2012  DISCHARGE DIAGNOSES:  1. Systemic inflammatory response syndrome secondary to acute diverticulitis, resolved.  2. Acute diverticulitis.  3. Hypertension.  4. Chronic back pain.   DISCHARGE MEDICATIONS: 1. Zofran 4 mg t.i.d. p.r.n. for nausea.  2. Percocet 5/325, every 4 to 6 hours as needed for pain.  3. Diovan 12.5 mg p.o. daily.  4. Aspirin 81 mg p.o. daily.  5. Atorvastatin 10 mg p.o. daily.  6. Augmentin 875 mg p.o. b.i.d. for 5 days.   DIET: Low sodium diet.  Take low residue diet for 4 days.   FOLLOWUP:  Follow up with Dr. [name] in one week, and also follow up with GI, Dr. Candace Cruise, in one week to 10 days.   CONSULTATIONS: GI consult with Dr. Candace Cruise.   HOSPITAL COURSE: The patient is a 69 year old female with history of hypertension, hyperlipidemia, came in because of abdominal pain, dizziness, and near syncope. The patient thoroughly evaluated in the Emergency Room patient's symptoms and her history concerning for diverticulitis. The patient said that previously when she had diverticulitis, she had the same presentation when she came.  She has abdominal pain, severe around left lower quadrant at 9/10 in severity and also found to have blood pressure of 170/50 and tachycardia with heart rate up to 103. The patient's CAT scan of the abdomen showed diverticulitis and white count on admission 12.2.   The patient's kidney function was normal on admission. She is admitted to medical floor and she had diverticulitis of sigmoid colon on the CAT scan and started on IV Zosyn. SHE IS ALLERGIC TO CIPRO AND FLAGYL.  She has not had any in the hospital due to national shortage, so she was started on Zosyn. Her blood cultures were negative. The patient white count also returned to 10.2 .  Her blood pressure also improved with fluids and the patient was on Losartan  at home, which was stopped, and the patient's blood pressure improved to 140/70. However, she had nausea the following day with  diarrhea.   Dr. Candace Cruise saw the patient and recommended to change the diet to clear liquids. We did that and the patient felt better, so she was on clear liquids for a day and then advanced to soft diet yesterday. Today, she says she has little pain in the left but her pain is 80% better. She really wants to go home, and the patient's last IV access night and she could not get Zosyn. The patient has poor IV access and she is taking Augmentin. The patient cannot take her Hall. I am going to discharge her with Augmentin suspension.  The patient says she cannot swallow the big pills, so we are going give Augmentin suspension and continue Zofran and also pain medications as needed for her diverticulitis and the patient is supposed to follow up with Dr. Candace Cruise in a week to 10 days.   Hypertension. History of hypertension takes Losartan at home. The patient is hypotensive when she came in at first, so back pain medications were not given, but Dr. giving the fluids. The patient said she is swollen. He is feeling, so I gave her a dose of Lasix to decrease the edema that she felt he and I do not feel anything physically, so I told her to continue diet one and see her primary doctor.  TIME SPENT ON DISCHARGE PREPARATION: More than 30 minutes.  She is advised to continue them. Residual diet for a few days and eat at home.   TIME SPENT: More than 30 minutes   ____________________________ Epifanio Lesches, MD sk:rw D: 09/05/2012 12:18:10 ET T: 09/05/2012 13:59:17 ET JOB#: [id number]  cc: Epifanio Lesches, MD, <Dictator> Guadalupe Maple, MD Lupita Dawn. Candace Cruise, MD Epifanio Lesches MD ELECTRONICALLY SIGNED 09/05/2012 22:48

## 2014-08-01 DIAGNOSIS — M25531 Pain in right wrist: Secondary | ICD-10-CM | POA: Diagnosis not present

## 2014-08-01 DIAGNOSIS — M25552 Pain in left hip: Secondary | ICD-10-CM | POA: Diagnosis not present

## 2014-08-01 DIAGNOSIS — M545 Low back pain: Secondary | ICD-10-CM | POA: Diagnosis not present

## 2014-08-15 DIAGNOSIS — R52 Pain, unspecified: Secondary | ICD-10-CM | POA: Diagnosis not present

## 2014-08-15 DIAGNOSIS — M1990 Unspecified osteoarthritis, unspecified site: Secondary | ICD-10-CM | POA: Diagnosis not present

## 2014-08-15 DIAGNOSIS — E785 Hyperlipidemia, unspecified: Secondary | ICD-10-CM | POA: Diagnosis not present

## 2014-08-29 DIAGNOSIS — G5601 Carpal tunnel syndrome, right upper limb: Secondary | ICD-10-CM | POA: Diagnosis not present

## 2014-09-11 ENCOUNTER — Telehealth: Payer: Self-pay | Admitting: Unknown Physician Specialty

## 2014-09-11 ENCOUNTER — Other Ambulatory Visit: Payer: Self-pay | Admitting: Unknown Physician Specialty

## 2014-09-11 DIAGNOSIS — K5732 Diverticulitis of large intestine without perforation or abscess without bleeding: Secondary | ICD-10-CM

## 2014-09-11 MED ORDER — METRONIDAZOLE 500 MG PO TABS: 500.0000 mg | ORAL_TABLET | Freq: Three times a day (TID) | ORAL | Status: DC

## 2014-09-11 MED ORDER — AMOXICILLIN-POT CLAVULANATE 875-125 MG PO TABS: 1.0000 | ORAL_TABLET | Freq: Two times a day (BID) | ORAL | Status: DC

## 2014-09-11 NOTE — Telephone Encounter (Signed)
Pt called stated she is having issues with diarrhea and pains in her stomach. Pt wanted to be seen but we have no available slots. Pt wanted to know if something can be called in. Please call Pt @ [phone] or [phone]. Pharm is [facility]. Please advise.

## 2014-09-11 NOTE — Telephone Encounter (Signed)
Patient would prefer Flagyl

## 2014-09-11 NOTE — Telephone Encounter (Signed)
Done. thanks

## 2014-09-11 NOTE — Telephone Encounter (Signed)
OK.  I called in Augmentin.  She will have to go to the ER if she is not improving on the Augmentin

## 2014-09-11 NOTE — Telephone Encounter (Signed)
Patient now states the diarrhea has stopped, she is concerned her diverticulitis is flairing and would like an ABX.

## 2014-09-11 NOTE — Telephone Encounter (Signed)
I don't usually call anything in for diarrhea. It is best to let that run it's course.  If it feels like something that needs to be treated, such as dehydration, fever, or worsening abdominal pain, please encourage her to go to urgent care.

## 2014-09-14 ENCOUNTER — Other Ambulatory Visit: Payer: Self-pay | Admitting: Unknown Physician Specialty

## 2014-09-14 DIAGNOSIS — M545 Low back pain: Secondary | ICD-10-CM

## 2014-09-14 MED ORDER — HYDROCODONE-ACETAMINOPHEN 5-325 MG PO TABS: 1.0000 | ORAL_TABLET | Freq: Two times a day (BID) | ORAL | Status: DC

## 2014-10-10 DIAGNOSIS — G5601 Carpal tunnel syndrome, right upper limb: Secondary | ICD-10-CM | POA: Diagnosis not present

## 2014-10-26 ENCOUNTER — Other Ambulatory Visit: Payer: Self-pay | Admitting: Unknown Physician Specialty

## 2014-10-26 MED ORDER — HYDROCODONE-ACETAMINOPHEN 5-325 MG PO TABS: 1.0000 | ORAL_TABLET | Freq: Two times a day (BID) | ORAL | Status: DC

## 2014-11-05 ENCOUNTER — Other Ambulatory Visit: Payer: Self-pay

## 2014-11-05 ENCOUNTER — Telehealth: Payer: Self-pay | Admitting: Unknown Physician Specialty

## 2014-11-05 DIAGNOSIS — F419 Anxiety disorder, unspecified: Secondary | ICD-10-CM

## 2014-11-05 MED ORDER — ALPRAZOLAM 0.25 MG PO TABS: 0.2500 mg | ORAL_TABLET | Freq: Every evening | ORAL | Status: DC | PRN

## 2014-11-05 NOTE — Telephone Encounter (Signed)
E-fax came through for refill on: Rx: Alprazolam Copy in basket.

## 2014-11-06 NOTE — Telephone Encounter (Signed)
Duplicate message. 

## 2014-11-23 ENCOUNTER — Other Ambulatory Visit: Payer: Self-pay | Admitting: Unknown Physician Specialty

## 2014-11-23 MED ORDER — HYDROCODONE-ACETAMINOPHEN 5-325 MG PO TABS: 1.0000 | ORAL_TABLET | Freq: Two times a day (BID) | ORAL | Status: DC

## 2014-11-28 ENCOUNTER — Other Ambulatory Visit: Payer: Self-pay | Admitting: Family Medicine

## 2014-11-28 DIAGNOSIS — F419 Anxiety disorder, unspecified: Secondary | ICD-10-CM

## 2014-11-28 NOTE — Telephone Encounter (Signed)
Called and let patient know alprazolam and losartan were being refilled and that the hydrocodone would be refilled when [place] returns.

## 2014-11-28 NOTE — Telephone Encounter (Signed)
Called Alprazolam into pharmacy. Was looking at the losartan rx in [facility] and noticed that the pt should still have a refill on this so I asked them on the phone and they stated she did so they are going to fill that as well.

## 2014-11-28 NOTE — Telephone Encounter (Signed)
Patient was last seen on 08/15/14, practice partner number is [id number], and pharmacy is [facility]. Patient's chart says the alprazolam was called into pharmacy on 11/05/14 but I called the pharmacy and they stated they do not have it. Hydrocodone says refilled on 11/23/14 but it is nowhere to be found up front.

## 2014-11-28 NOTE — Telephone Encounter (Signed)
She got her hydrocodone filled on 11/04/14, it does look like she is due soon, but since it was filled later- she can wait until Malachy Mood gets back for this. If the pharmacy never got her alprazolam, her Rx from 11/05/14 that was supposed to be called in can be called back in.

## 2014-11-28 NOTE — Telephone Encounter (Signed)
Tried to call patient and ask exactly what medications she is needing refilled because we do not have "lisartan". Im thinking it may be the valsartan but I am not positive so I left the patient a voicemail to return my call so I can find out exactly what medication it is she is needing refilled.

## 2014-11-28 NOTE — Telephone Encounter (Addendum)
PT CALLED IN AND NEEDS REFILL FOR LISARTIN [facility]. PT ALSO STATED THAT SHE NEEDED REFILL ON ALPRAZOLAM

## 2014-11-29 ENCOUNTER — Other Ambulatory Visit: Payer: Self-pay | Admitting: Unknown Physician Specialty

## 2014-11-29 MED ORDER — HYDROCODONE-ACETAMINOPHEN 5-325 MG PO TABS: 1.0000 | ORAL_TABLET | Freq: Two times a day (BID) | ORAL | Status: DC

## 2014-12-13 DIAGNOSIS — R002 Palpitations: Secondary | ICD-10-CM | POA: Diagnosis not present

## 2014-12-13 DIAGNOSIS — R079 Chest pain, unspecified: Secondary | ICD-10-CM | POA: Diagnosis not present

## 2014-12-13 DIAGNOSIS — I119 Hypertensive heart disease without heart failure: Secondary | ICD-10-CM | POA: Diagnosis not present

## 2014-12-13 DIAGNOSIS — R0602 Shortness of breath: Secondary | ICD-10-CM | POA: Diagnosis not present

## 2014-12-21 DIAGNOSIS — R079 Chest pain, unspecified: Secondary | ICD-10-CM | POA: Diagnosis not present

## 2014-12-25 ENCOUNTER — Other Ambulatory Visit: Payer: Self-pay | Admitting: Unknown Physician Specialty

## 2014-12-25 MED ORDER — HYDROCODONE-ACETAMINOPHEN 5-325 MG PO TABS: 1.0000 | ORAL_TABLET | Freq: Two times a day (BID) | ORAL | Status: DC

## 2014-12-26 DIAGNOSIS — E784 Other hyperlipidemia: Secondary | ICD-10-CM | POA: Diagnosis not present

## 2014-12-26 DIAGNOSIS — I119 Hypertensive heart disease without heart failure: Secondary | ICD-10-CM | POA: Diagnosis not present

## 2014-12-26 DIAGNOSIS — R079 Chest pain, unspecified: Secondary | ICD-10-CM | POA: Diagnosis not present

## 2015-01-08 ENCOUNTER — Ambulatory Visit
Admission: RE | Admit: 2015-01-08 | Discharge: 2015-01-08 | Disposition: A | Discharge status: Home / Self Care | Payer: Medicare Other | Source: Ambulatory Visit | Attending: Nurse Practitioner | Admitting: Nurse Practitioner

## 2015-01-08 ENCOUNTER — Other Ambulatory Visit: Payer: Self-pay | Admitting: Nurse Practitioner

## 2015-01-08 DIAGNOSIS — R1013 Epigastric pain: Secondary | ICD-10-CM | POA: Diagnosis present

## 2015-01-08 DIAGNOSIS — R1319 Other dysphagia: Secondary | ICD-10-CM | POA: Diagnosis not present

## 2015-01-08 DIAGNOSIS — R1011 Right upper quadrant pain: Secondary | ICD-10-CM

## 2015-01-08 DIAGNOSIS — R160 Hepatomegaly, not elsewhere classified: Secondary | ICD-10-CM | POA: Insufficient documentation

## 2015-01-08 DIAGNOSIS — R1110 Vomiting, unspecified: Secondary | ICD-10-CM | POA: Diagnosis not present

## 2015-01-16 ENCOUNTER — Other Ambulatory Visit: Payer: Self-pay | Admitting: Unknown Physician Specialty

## 2015-01-16 MED ORDER — HYDROCODONE-ACETAMINOPHEN 5-325 MG PO TABS: 1.0000 | ORAL_TABLET | Freq: Two times a day (BID) | ORAL | Status: DC

## 2015-01-28 ENCOUNTER — Telehealth: Payer: Self-pay

## 2015-01-28 NOTE — Telephone Encounter (Signed)
Routing to provider  

## 2015-01-28 NOTE — Telephone Encounter (Signed)
OK for her to pick up her Rx from 28 day folder. Noted that she is picking it up a couple of days early. Aware of this. To pick up next month at the usual time.

## 2015-01-28 NOTE — Telephone Encounter (Signed)
Called patient and let her know that rx could be picked up at the front desk.

## 2015-01-28 NOTE — Telephone Encounter (Signed)
Patient is requesting a refill for Hydrocodone.  Patient states that she will be going out of town tomorrow morning and needs the medication before she leaves. Please call patient to let her know if another provider can refill since Malachy Mood is out of the office.  Patient can be reached at [phone]  Or [phone].

## 2015-01-29 ENCOUNTER — Other Ambulatory Visit: Payer: Self-pay | Admitting: Nurse Practitioner

## 2015-01-29 DIAGNOSIS — R160 Hepatomegaly, not elsewhere classified: Secondary | ICD-10-CM | POA: Diagnosis not present

## 2015-01-29 DIAGNOSIS — R1319 Other dysphagia: Secondary | ICD-10-CM | POA: Diagnosis not present

## 2015-01-29 DIAGNOSIS — K760 Fatty (change of) liver, not elsewhere classified: Secondary | ICD-10-CM

## 2015-01-29 DIAGNOSIS — R1013 Epigastric pain: Secondary | ICD-10-CM | POA: Diagnosis not present

## 2015-01-29 DIAGNOSIS — R1032 Left lower quadrant pain: Secondary | ICD-10-CM | POA: Diagnosis not present

## 2015-01-31 ENCOUNTER — Ambulatory Visit
Admission: RE | Admit: 2015-01-31 | Discharge: 2015-01-31 | Disposition: A | Discharge status: Home / Self Care | Payer: Medicare Other | Source: Ambulatory Visit | Attending: Nurse Practitioner | Admitting: Nurse Practitioner

## 2015-01-31 ENCOUNTER — Other Ambulatory Visit: Payer: Self-pay | Admitting: Nurse Practitioner

## 2015-01-31 DIAGNOSIS — N83201 Unspecified ovarian cyst, right side: Secondary | ICD-10-CM | POA: Diagnosis not present

## 2015-01-31 DIAGNOSIS — N83202 Unspecified ovarian cyst, left side: Secondary | ICD-10-CM | POA: Diagnosis not present

## 2015-01-31 DIAGNOSIS — K5730 Diverticulosis of large intestine without perforation or abscess without bleeding: Secondary | ICD-10-CM | POA: Diagnosis not present

## 2015-01-31 DIAGNOSIS — K760 Fatty (change of) liver, not elsewhere classified: Secondary | ICD-10-CM | POA: Insufficient documentation

## 2015-01-31 DIAGNOSIS — K449 Diaphragmatic hernia without obstruction or gangrene: Secondary | ICD-10-CM | POA: Insufficient documentation

## 2015-01-31 DIAGNOSIS — R1013 Epigastric pain: Secondary | ICD-10-CM | POA: Insufficient documentation

## 2015-01-31 DIAGNOSIS — R160 Hepatomegaly, not elsewhere classified: Secondary | ICD-10-CM

## 2015-01-31 MED ORDER — IOHEXOL 300 MG/ML  SOLN
100.0000 mL | Freq: Once | INTRAMUSCULAR | Status: DC | PRN
Start: 2015-01-31 — End: 2015-02-01

## 2015-02-01 ENCOUNTER — Encounter: Payer: Self-pay | Admitting: *Deleted

## 2015-02-01 ENCOUNTER — Other Ambulatory Visit: Payer: Self-pay | Admitting: Nurse Practitioner

## 2015-02-01 DIAGNOSIS — K760 Fatty (change of) liver, not elsewhere classified: Secondary | ICD-10-CM

## 2015-02-04 ENCOUNTER — Ambulatory Visit: Payer: Medicare Other | Admitting: Anesthesiology

## 2015-02-04 ENCOUNTER — Encounter
Admission: RE | Disposition: A | Discharge status: Home / Self Care | Payer: Self-pay | Source: Ambulatory Visit | Attending: Gastroenterology

## 2015-02-04 ENCOUNTER — Encounter: Payer: Self-pay | Admitting: Anesthesiology

## 2015-02-04 ENCOUNTER — Ambulatory Visit
Admission: RE | Admit: 2015-02-04 | Discharge: 2015-02-04 | Disposition: A | Discharge status: Home / Self Care | Payer: Medicare Other | Source: Ambulatory Visit | Attending: Gastroenterology | Admitting: Gastroenterology

## 2015-02-04 DIAGNOSIS — Z7982 Long term (current) use of aspirin: Secondary | ICD-10-CM | POA: Insufficient documentation

## 2015-02-04 DIAGNOSIS — R1310 Dysphagia, unspecified: Secondary | ICD-10-CM | POA: Insufficient documentation

## 2015-02-04 DIAGNOSIS — R011 Cardiac murmur, unspecified: Secondary | ICD-10-CM | POA: Diagnosis not present

## 2015-02-04 DIAGNOSIS — M545 Low back pain: Secondary | ICD-10-CM | POA: Diagnosis not present

## 2015-02-04 DIAGNOSIS — Z801 Family history of malignant neoplasm of trachea, bronchus and lung: Secondary | ICD-10-CM | POA: Insufficient documentation

## 2015-02-04 DIAGNOSIS — Z881 Allergy status to other antibiotic agents status: Secondary | ICD-10-CM | POA: Insufficient documentation

## 2015-02-04 DIAGNOSIS — I252 Old myocardial infarction: Secondary | ICD-10-CM | POA: Insufficient documentation

## 2015-02-04 DIAGNOSIS — E785 Hyperlipidemia, unspecified: Secondary | ICD-10-CM | POA: Diagnosis not present

## 2015-02-04 DIAGNOSIS — Z8249 Family history of ischemic heart disease and other diseases of the circulatory system: Secondary | ICD-10-CM | POA: Insufficient documentation

## 2015-02-04 DIAGNOSIS — G5601 Carpal tunnel syndrome, right upper limb: Secondary | ICD-10-CM | POA: Insufficient documentation

## 2015-02-04 DIAGNOSIS — Z90710 Acquired absence of both cervix and uterus: Secondary | ICD-10-CM | POA: Insufficient documentation

## 2015-02-04 DIAGNOSIS — K449 Diaphragmatic hernia without obstruction or gangrene: Secondary | ICD-10-CM | POA: Insufficient documentation

## 2015-02-04 DIAGNOSIS — R1013 Epigastric pain: Secondary | ICD-10-CM | POA: Diagnosis not present

## 2015-02-04 DIAGNOSIS — M109 Gout, unspecified: Secondary | ICD-10-CM | POA: Diagnosis not present

## 2015-02-04 DIAGNOSIS — K760 Fatty (change of) liver, not elsewhere classified: Secondary | ICD-10-CM | POA: Insufficient documentation

## 2015-02-04 DIAGNOSIS — M1990 Unspecified osteoarthritis, unspecified site: Secondary | ICD-10-CM | POA: Insufficient documentation

## 2015-02-04 DIAGNOSIS — K208 Other esophagitis: Secondary | ICD-10-CM | POA: Diagnosis not present

## 2015-02-04 DIAGNOSIS — K222 Esophageal obstruction: Secondary | ICD-10-CM | POA: Diagnosis not present

## 2015-02-04 DIAGNOSIS — K219 Gastro-esophageal reflux disease without esophagitis: Secondary | ICD-10-CM | POA: Diagnosis not present

## 2015-02-04 DIAGNOSIS — Z79899 Other long term (current) drug therapy: Secondary | ICD-10-CM | POA: Insufficient documentation

## 2015-02-04 DIAGNOSIS — K210 Gastro-esophageal reflux disease with esophagitis: Secondary | ICD-10-CM | POA: Diagnosis not present

## 2015-02-04 DIAGNOSIS — R1011 Right upper quadrant pain: Secondary | ICD-10-CM | POA: Diagnosis not present

## 2015-02-04 DIAGNOSIS — G8929 Other chronic pain: Secondary | ICD-10-CM | POA: Diagnosis not present

## 2015-02-04 HISTORY — PX: ESOPHAGOGASTRODUODENOSCOPY (EGD) WITH PROPOFOL: SHX5813

## 2015-02-04 SURGERY — ESOPHAGOGASTRODUODENOSCOPY (EGD) WITH PROPOFOL
Anesthesia: General

## 2015-02-04 MED ORDER — PROPOFOL 10 MG/ML IV BOLUS
INTRAVENOUS | Status: DC | PRN
  Administered 2015-02-04: 100 mg via INTRAVENOUS

## 2015-02-04 MED ORDER — SODIUM CHLORIDE 0.9 % IV SOLN: INTRAVENOUS | Status: DC

## 2015-02-04 MED ORDER — SODIUM CHLORIDE 0.9 % IV SOLN
INTRAVENOUS | Status: DC
Start: 2015-02-04 — End: 2015-02-04
  Administered 2015-02-04: 1000 mL via INTRAVENOUS

## 2015-02-04 MED ORDER — PROPOFOL 500 MG/50ML IV EMUL
INTRAVENOUS | Status: DC | PRN
Start: 2015-02-04 — End: 2015-02-04
  Administered 2015-02-04: 150 ug/kg/min via INTRAVENOUS

## 2015-02-04 MED ORDER — GLYCOPYRROLATE 0.2 MG/ML IJ SOLN
INTRAMUSCULAR | Status: DC | PRN
  Administered 2015-02-04: 0.2 mg via INTRAVENOUS

## 2015-02-04 MED ORDER — LIDOCAINE HCL (CARDIAC) 20 MG/ML IV SOLN
INTRAVENOUS | Status: DC | PRN
  Administered 2015-02-04: 100 mg via INTRAVENOUS

## 2015-02-04 NOTE — Op Note (Signed)
[facility]astroenterology Patient Name: Tamara Fleming Procedure Date: 02/04/2015 11:26 AM MRN: [id number] Account #: [id number] Date of Birth: [birth year] Admit Type: Outpatient Age: 69 Room: [facility] ENDO ROOM 4 Gender: Female Note Status: Finalized Procedure:         Upper GI endoscopy Indications:       Epigastric abdominal pain, Odynophagia Providers:         Lupita Dawn. Candace Cruise, MD Referring MD:      Kathrine Haddock, PA (Referring MD), Isaias Cowman, MD                     (Referring MD) Medicines:         Monitored Anesthesia Care Complications:     No immediate complications. Procedure:         Pre-Anesthesia Assessment:                    - Prior to the procedure, a History and Physical was                     performed, and patient medications, allergies and                     sensitivities were reviewed. The patient's tolerance of                     previous anesthesia was reviewed.                    - The risks and benefits of the procedure and the sedation                     options and risks were discussed with the patient. All                     questions were answered and informed consent was obtained.                    - After reviewing the risks and benefits, the patient was                     deemed in satisfactory condition to undergo the procedure.                    After obtaining informed consent, the endoscope was passed                     under direct vision. Throughout the procedure, the                     patient's blood pressure, pulse, and oxygen saturations                     were monitored continuously. The Endoscope was introduced                     through the mouth, and advanced to the second part of                     duodenum. The upper GI endoscopy was accomplished without                     difficulty. The patient tolerated the procedure well. Findings:      A benign-appearing, intrinsic mild stenosis was found at the  gastroesophageal junction. Biopsies were taken with a cold forceps for       histology. The scope was withdrawn. Dilation was performed with a       Maloney dilator with mild resistance at 45 Fr.      The exam was otherwise without abnormality.      The entire examined stomach was normal.      The examined duodenum was normal. Impression:        - Benign-appearing esophageal stricture. Biopsied. Dilated.                    - The examination was otherwise normal.                    - Normal stomach.                    - Normal examined duodenum. Recommendation:    - Discharge patient to home.                    - Await pathology results.                    - Observe patient's clinical course.                    - The findings and recommendations were discussed with the                     patient. Procedure Code(s): --- Professional ---                    [phone], Esophagogastroduodenoscopy, flexible, transoral;                     with biopsy, single or multiple                    43450, Dilation of esophagus, by unguided sound or bougie,                     single or multiple passes Diagnosis Code(s): --- Professional ---                    K22.2, Esophageal obstruction                    R10.13, Epigastric pain                    R13.10, Dysphagia, unspecified CPT copyright 2014 American Medical Association. All rights reserved. The codes documented in this report are preliminary and upon coder review may  be revised to meet current compliance requirements. Hulen Luster, MD 02/04/2015 11:34:59 AM This report has been signed electronically. Number of Addenda: 0 Note Initiated On: 02/04/2015 11:26 AM      [facility]

## 2015-02-04 NOTE — H&P (Signed)
  Date of Initial H&P:01/08/2015  History reviewed, patient examined, no change in status, stable for surgery.

## 2015-02-04 NOTE — Anesthesia Postprocedure Evaluation (Signed)
  Anesthesia Post-op Note  Patient: Tamara Fleming  Procedure(s) Performed: Procedure(s): ESOPHAGOGASTRODUODENOSCOPY (EGD) WITH PROPOFOL (N/A)  Anesthesia type:General  Patient location: PACU  Post pain: Pain level controlled  Post assessment: Post-op Vital signs reviewed, Patient's Cardiovascular Status Stable, Respiratory Function Stable, Patent Airway and No signs of Nausea or vomiting  Post vital signs: Reviewed and stable  Last Vitals:  Filed Vitals:   02/04/15 1210  BP: 144/81  Pulse: 64  Temp:   Resp: 12    Level of consciousness: awake, alert  and patient cooperative  Complications: No apparent anesthesia complications

## 2015-02-04 NOTE — Transfer of Care (Signed)
Immediate Anesthesia Transfer of Care Note  Patient: Tamara Fleming  Procedure(s) Performed: Procedure(s): ESOPHAGOGASTRODUODENOSCOPY (EGD) WITH PROPOFOL (N/A)  Patient Location: Endoscopy Unit  Anesthesia Type:General  Level of Consciousness: awake, alert  and oriented  Airway & Oxygen Therapy: Patient Spontanous Breathing and Patient connected to nasal cannula oxygen  Post-op Assessment: Report given to RN and Post -op Vital signs reviewed and stable  Post vital signs: Reviewed and stable  Last Vitals:  Filed Vitals:   02/04/15 1137  BP:   Pulse:   Temp: 36 C  Resp:     Complications: No apparent anesthesia complications

## 2015-02-04 NOTE — Anesthesia Preprocedure Evaluation (Signed)
Anesthesia Evaluation  Patient identified by MRN, date of birth, ID band Patient awake    Reviewed: Allergy & Precautions, H&P , NPO status , Patient's Chart, lab work & pertinent test results, reviewed documented beta blocker date and time   History of Anesthesia Complications Negative for: history of anesthetic complications  Airway Mallampati: IV  TM Distance: >3 FB Neck ROM: full  Mouth opening: Limited Mouth Opening  Dental no notable dental hx. (+) Teeth Intact   Pulmonary neg pulmonary ROS,    Pulmonary exam normal breath sounds clear to auscultation       Cardiovascular Exercise Tolerance: Good hypertension, On Medications (-) angina(-) CAD, (-) Past MI, (-) Cardiac Stents and (-) CABG Normal cardiovascular exam(-) dysrhythmias + Valvular Problems/Murmurs  Rhythm:regular Rate:Normal     Neuro/Psych negative neurological ROS  negative psych ROS   GI/Hepatic hiatal hernia, GERD  ,Fatty liver disease   Endo/Other  negative endocrine ROS  Renal/GU negative Renal ROS  negative genitourinary   Musculoskeletal   Abdominal   Peds  Hematology negative hematology ROS (+)   Anesthesia Other Findings Past Medical History:   Hypertension                                                 Chest pain                                                   Heart murmur                                                 Hiatal hernia                                                OA (osteoarthritis)                                          Hyperlipidemia                                               Gout                                                         Chronic low back pain                                        Fatigue  Carpal tunnel syndrome                          05/09/2014       Comment:Right   Reproductive/Obstetrics negative OB ROS                              Anesthesia Physical Anesthesia Plan  ASA: II  Anesthesia Plan: General   Post-op Pain Management:    Induction:   Airway Management Planned:   Additional Equipment:   Intra-op Plan:   Post-operative Plan:   Informed Consent: I have reviewed the patients History and Physical, chart, labs and discussed the procedure including the risks, benefits and alternatives for the proposed anesthesia with the patient or authorized representative who has indicated his/her understanding and acceptance.   Dental Advisory Given  Plan Discussed with: Anesthesiologist, CRNA and Surgeon  Anesthesia Plan Comments:         Anesthesia Quick Evaluation

## 2015-02-05 ENCOUNTER — Encounter: Payer: Self-pay | Admitting: Gastroenterology

## 2015-02-05 LAB — SURGICAL PATHOLOGY

## 2015-02-08 ENCOUNTER — Ambulatory Visit
Admission: RE | Admit: 2015-02-08 | Discharge: 2015-02-08 | Disposition: A | Discharge status: Home / Self Care | Payer: Medicare Other | Source: Ambulatory Visit | Attending: Nurse Practitioner | Admitting: Nurse Practitioner

## 2015-02-08 DIAGNOSIS — K760 Fatty (change of) liver, not elsewhere classified: Secondary | ICD-10-CM | POA: Diagnosis not present

## 2015-02-08 DIAGNOSIS — K7581 Nonalcoholic steatohepatitis (NASH): Secondary | ICD-10-CM | POA: Diagnosis not present

## 2015-02-18 ENCOUNTER — Other Ambulatory Visit: Payer: Self-pay | Admitting: Unknown Physician Specialty

## 2015-02-18 MED ORDER — HYDROCODONE-ACETAMINOPHEN 5-325 MG PO TABS: 1.0000 | ORAL_TABLET | Freq: Two times a day (BID) | ORAL | Status: DC

## 2015-03-04 DIAGNOSIS — Z23 Encounter for immunization: Secondary | ICD-10-CM | POA: Diagnosis not present

## 2015-03-04 DIAGNOSIS — R1032 Left lower quadrant pain: Secondary | ICD-10-CM | POA: Diagnosis not present

## 2015-03-04 DIAGNOSIS — K5730 Diverticulosis of large intestine without perforation or abscess without bleeding: Secondary | ICD-10-CM | POA: Diagnosis not present

## 2015-03-11 ENCOUNTER — Other Ambulatory Visit: Payer: Self-pay

## 2015-03-11 DIAGNOSIS — F419 Anxiety disorder, unspecified: Secondary | ICD-10-CM

## 2015-03-11 DIAGNOSIS — E785 Hyperlipidemia, unspecified: Secondary | ICD-10-CM

## 2015-03-11 DIAGNOSIS — E7800 Pure hypercholesterolemia, unspecified: Secondary | ICD-10-CM

## 2015-03-11 DIAGNOSIS — I119 Hypertensive heart disease without heart failure: Secondary | ICD-10-CM

## 2015-03-11 DIAGNOSIS — R5383 Other fatigue: Secondary | ICD-10-CM

## 2015-03-11 MED ORDER — ATORVASTATIN CALCIUM 10 MG PO TABS: 10.0000 mg | ORAL_TABLET | Freq: Every day | ORAL | Status: DC

## 2015-03-11 NOTE — Telephone Encounter (Signed)
LAST VISIT: 08/15/2014 PRACTICE PARTNER: [id number]  Request for atorvastatin 10mg  tab

## 2015-03-18 ENCOUNTER — Other Ambulatory Visit: Payer: Self-pay | Admitting: Unknown Physician Specialty

## 2015-03-18 DIAGNOSIS — Z79891 Long term (current) use of opiate analgesic: Secondary | ICD-10-CM

## 2015-03-18 MED ORDER — HYDROCODONE-ACETAMINOPHEN 5-325 MG PO TABS: 1.0000 | ORAL_TABLET | Freq: Two times a day (BID) | ORAL | Status: DC

## 2015-03-26 ENCOUNTER — Other Ambulatory Visit: Payer: Medicare Other

## 2015-03-26 DIAGNOSIS — Z79891 Long term (current) use of opiate analgesic: Secondary | ICD-10-CM

## 2015-03-26 DIAGNOSIS — Z0289 Encounter for other administrative examinations: Secondary | ICD-10-CM | POA: Diagnosis not present

## 2015-04-01 LAB — URINE DRUGS OF ABUSE SCREEN W ALC, ROUTINE (REF LAB)
Amphetamines, Urine: NEGATIVE ng/mL
Barbiturate Quant, Ur: NEGATIVE ng/mL
Benzodiazepine Quant, Ur: NEGATIVE ng/mL
Cannabinoid Quant, Ur: NEGATIVE ng/mL
Cocaine (Metab.): NEGATIVE ng/mL
Ethanol U, Quan: NEGATIVE %
Methadone Screen, Urine: NEGATIVE ng/mL
PCP Quant, Ur: NEGATIVE ng/mL
Propoxyphene: NEGATIVE ng/mL

## 2015-04-01 LAB — OPIATES CONFIRMATION, URINE: OPIATES: NEGATIVE

## 2015-04-12 ENCOUNTER — Ambulatory Visit (INDEPENDENT_AMBULATORY_CARE_PROVIDER_SITE_OTHER): Payer: Medicare Other | Admitting: Family Medicine

## 2015-04-12 ENCOUNTER — Encounter: Payer: Self-pay | Admitting: Family Medicine

## 2015-04-12 VITALS — BP 138/79 | HR 96 | Temp 98.9°F | Ht 64.1 in | Wt 174.0 lb

## 2015-04-12 DIAGNOSIS — J040 Acute laryngitis: Secondary | ICD-10-CM

## 2015-04-12 MED ORDER — PREDNISONE 10 MG PO TABS: ORAL_TABLET | ORAL | Status: DC

## 2015-04-12 MED ORDER — BENZONATATE 200 MG PO CAPS: 200.0000 mg | ORAL_CAPSULE | Freq: Three times a day (TID) | ORAL | Status: DC | PRN

## 2015-04-12 NOTE — Patient Instructions (Signed)
Laryngitis  Laryngitis is inflammation of your vocal cords. This causes hoarseness, coughing, loss of voice, sore throat, or a dry throat. Your vocal cords are two bands of muscles that are found in your throat. When you speak, these cords come together and vibrate. These vibrations come out through your mouth as sound. When your vocal cords are inflamed, your voice sounds different.  Laryngitis can be temporary (acute) or long-term (chronic). Most cases of acute laryngitis improve with time. Chronic laryngitis is laryngitis that lasts for more than three weeks.  CAUSES  Acute laryngitis may be caused by:   A viral infection.   Lots of talking, yelling, or singing. This is also called vocal strain.   Bacterial infections.  Chronic laryngitis may be caused by:   Vocal strain.   Injury to your vocal cords.   Acid reflux (gastroesophageal reflux disease or GERD).   Allergies.   Sinus infection.   Smoking.   Alcohol abuse.   Breathing in chemicals or dust.   Growths on the vocal cords.  RISK FACTORS  Risk factors for laryngitis include:   Smoking.   Alcohol abuse.   Having allergies.  SIGNS AND SYMPTOMS  Symptoms of laryngitis may include:   Low, hoarse voice.   Loss of voice.   Dry cough.   Sore throat.   Stuffy nose.  DIAGNOSIS  Laryngitis may be diagnosed by:   Physical exam.   Throat culture.   Blood test.   Laryngoscopy. This procedure allows your health care provider to look at your vocal cords with a mirror or viewing tube.  TREATMENT  Treatment for laryngitis depends on what is causing it. Usually, treatment involves resting your voice and using medicines to soothe your throat. However, if your laryngitis is caused by a bacterial infection, you may need to take antibiotic medicine. If your laryngitis is caused by a growth, you may need to have a procedure to remove it.  HOME CARE INSTRUCTIONS   Drink enough fluid to keep your urine clear or pale yellow.   Breathe in moist air. Use a  humidifier if you live in a dry climate.   Take medicines only as directed by your health care provider.   If you were prescribed an antibiotic medicine, finish it all even if you start to feel better.   Do not smoke cigarettes or electronic cigarettes. If you need help quitting, ask your health care provider.   Talk as little as possible. Also avoid whispering, which can cause vocal strain.   Write instead of talking. Do this until your voice is back to normal.  SEEK MEDICAL CARE IF:   You have a fever.   You have increasing pain.   You have difficulty swallowing.  SEEK IMMEDIATE MEDICAL CARE IF:   You cough up blood.   You have trouble breathing.     This information is not intended to replace advice given to you by your health care provider. Make sure you discuss any questions you have with your health care provider.     Document Released: 03/23/2005 Document Revised: 04/13/2014 Document Reviewed: 09/05/2013  Elsevier Interactive Patient Education 2016 [employer]

## 2015-04-12 NOTE — Progress Notes (Signed)
BP 138/79 mmHg  Pulse 96  Temp(Src) 98.9 F (37.2 C)  Ht 5' 4.1" (1.628 m)  Wt 174 lb (78.926 kg)  BMI 29.78 kg/m2  SpO2 99%   Subjective:    Patient ID: Tamara Fleming, female    DOB: [birth year], 70 y.o.   MRN: [id number]  HPI: Tamara Fleming is a 70 y.o. female  Chief Complaint  Patient presents with  . URI    X  5 days   UPPER RESPIRATORY TRACT INFECTION Duration: 5 days Worst symptom: hoarse Fever: no Cough: yes Shortness of breath: yes Wheezing: no Chest pain: no Chest tightness: yes Chest congestion: yes Nasal congestion: yes Runny nose: yes Post nasal drip: yes Sneezing: no Sore throat: no Swollen glands: no Sinus pressure: no Headache: yes Face pain: no Toothache: no Ear pain: no  Ear pressure: no  Eyes red/itching:yes, no itching,burning Eye drainage/crusting: no  Vomiting: no Rash: no Fatigue: yes Sick contacts: no Strep contacts: no  Context: worse Recurrent sinusitis: no Relief with OTC cold/cough medications: no  Treatments attempted: none   Relevant past medical, surgical, family and social history reviewed and updated as indicated. Interim medical history since our last visit reviewed. Allergies and medications reviewed and updated.  Review of Systems  Constitutional: Negative.   Respiratory: Positive for cough, chest tightness and shortness of breath. Negative for apnea, choking, wheezing and stridor.   Cardiovascular: Negative.   Psychiatric/Behavioral: Negative.     Per HPI unless specifically indicated above     Objective:    BP 138/79 mmHg  Pulse 96  Temp(Src) 98.9 F (37.2 C)  Ht 5' 4.1" (1.628 m)  Wt 174 lb (78.926 kg)  BMI 29.78 kg/m2  SpO2 99%  Wt Readings from Last 3 Encounters:  04/12/15 174 lb (78.926 kg)  02/04/15 173 lb (78.472 kg)  06/29/12 180 lb (81.647 kg)    Physical Exam  Constitutional: She is oriented to person, place, and time. She appears well-developed and well-nourished. No distress.  HENT:   Head: Normocephalic and atraumatic.  Right Ear: Hearing, tympanic membrane, external ear and ear canal normal.  Left Ear: Hearing, tympanic membrane, external ear and ear canal normal.  Nose: Nose normal. Right sinus exhibits no maxillary sinus tenderness and no frontal sinus tenderness. Left sinus exhibits no maxillary sinus tenderness and no frontal sinus tenderness.  Mouth/Throat: Uvula is midline, oropharynx is clear and moist and mucous membranes are normal. No oropharyngeal exudate.  Eyes: Conjunctivae, EOM and lids are normal. Pupils are equal, round, and reactive to light. Right eye exhibits no discharge. Left eye exhibits no discharge. No scleral icterus.  Neck: Normal range of motion. Neck supple. No JVD present. No tracheal deviation present. No thyromegaly present.  Cardiovascular: Normal rate, regular rhythm, normal heart sounds and intact distal pulses.  Exam reveals no gallop and no friction rub.   No murmur heard. Pulmonary/Chest: Effort normal and breath sounds normal. No stridor. No respiratory distress. She has no wheezes. She has no rales. She exhibits no tenderness.  Musculoskeletal: Normal range of motion.  Lymphadenopathy:    She has cervical adenopathy.  Neurological: She is alert and oriented to person, place, and time.  Skin: Skin is warm, dry and intact. No rash noted. No erythema. No pallor.  Psychiatric: She has a normal mood and affect. Her speech is normal and behavior is normal. Judgment and thought content normal. Cognition and memory are normal.  Nursing note and vitals reviewed.   Results for orders placed  or performed in visit on 03/26/15  Drugs of abuse scrn w alc, routine urine  Result Value Ref Range   Amphetamines, Urine Negative Cutoff=1000 ng/mL   Barbiturate Quant, Ur Negative Cutoff=300 ng/mL   Benzodiazepine Quant, Ur Negative Cutoff=300 ng/mL   Cannabinoid Quant, Ur Negative Cutoff=50 ng/mL   Cocaine (Metab.) Negative Cutoff=300 ng/mL    Opiate Quant, Ur See Final Results Cutoff=300 ng/mL   PCP Quant, Ur Negative Cutoff=25 ng/mL   Methadone Screen, Urine Negative Cutoff=300 ng/mL   Propoxyphene Negative Cutoff=300 ng/mL   Ethanol U, Quan Negative Cutoff=0.020 %  Opiates Confirmation, Urine  Result Value Ref Range   OPIATES Negative Cutoff=300      Assessment & Plan:   Problem List Items Addressed This Visit    None    Visit Diagnoses    Laryngitis, acute    -  Primary    Will treat with prednisone and tessalon perles for comfort. Call if not getting better or getting worse.         Follow up plan: Return if symptoms worsen or fail to improve.

## 2015-04-17 ENCOUNTER — Ambulatory Visit (INDEPENDENT_AMBULATORY_CARE_PROVIDER_SITE_OTHER): Payer: Medicare Other | Admitting: Family Medicine

## 2015-04-17 ENCOUNTER — Encounter: Payer: Self-pay | Admitting: Family Medicine

## 2015-04-17 VITALS — BP 130/76 | HR 81 | Temp 97.9°F | Ht 64.3 in | Wt 172.0 lb

## 2015-04-17 DIAGNOSIS — J040 Acute laryngitis: Secondary | ICD-10-CM | POA: Diagnosis not present

## 2015-04-17 MED ORDER — AMOXICILLIN-POT CLAVULANATE 875-125 MG PO TABS: 1.0000 | ORAL_TABLET | Freq: Two times a day (BID) | ORAL | Status: DC

## 2015-04-17 NOTE — Progress Notes (Signed)
BP 130/76 mmHg  Pulse 81  Temp(Src) 97.9 F (36.6 C)  Ht 5' 4.3" (1.633 m)  Wt 172 lb (78.019 kg)  BMI 29.26 kg/m2  SpO2 97%   Subjective:    Patient ID: Tamara Fleming, female    DOB: [birth year], 70 y.o.   MRN: [id number]  HPI: Tamara Fleming is a 70 y.o. female  Chief Complaint  Patient presents with  . Cough    Patient states that she isnt any better, she has coughed so much that her body hurts. She still has a productive cough and a sore throat. She has finished her medications that she was givn.   UPPER RESPIRATORY TRACT INFECTION Duration: 11 days Worst symptom: Cough, hoarseness Fever: yes Cough: yes Shortness of breath: yes Wheezing: no Chest pain: yes, with cough Chest tightness: yes Chest congestion: yes Nasal congestion: yes Runny nose: yes Post nasal drip: yes Sneezing: no Sore throat: no Swollen glands: no Sinus pressure: no Headache: yes Face pain: no Toothache: yes only with chewing Ear pain: no  Ear pressure: no  Eyes red/itching:yes Eye drainage/crusting: no  Vomiting: yes Rash: no Fatigue: yes Sick contacts: no Strep contacts: no  Context: stable Recurrent sinusitis: no Relief with OTC cold/cough medications: no  Treatments attempted: tessalon perles and prednisone 6 day taper  Relevant past medical, surgical, family and social history reviewed and updated as indicated. Interim medical history since our last visit reviewed. Allergies and medications reviewed and updated.  Review of Systems  Constitutional: Negative.   HENT: Positive for congestion, ear pain, nosebleeds, postnasal drip, rhinorrhea, sinus pressure, sneezing and voice change. Negative for dental problem, drooling, ear discharge, facial swelling, hearing loss, mouth sores, sore throat, tinnitus and trouble swallowing.   Respiratory: Negative.   Cardiovascular: Negative.   Psychiatric/Behavioral: Negative.     Per HPI unless specifically indicated above      Objective:    BP 130/76 mmHg  Pulse 81  Temp(Src) 97.9 F (36.6 C)  Ht 5' 4.3" (1.633 m)  Wt 172 lb (78.019 kg)  BMI 29.26 kg/m2  SpO2 97%  Wt Readings from Last 3 Encounters:  04/17/15 172 lb (78.019 kg)  04/12/15 174 lb (78.926 kg)  02/04/15 173 lb (78.472 kg)    Physical Exam  Constitutional: She is oriented to person, place, and time. She appears well-developed and well-nourished. No distress.  HENT:  Head: Normocephalic and atraumatic.  Right Ear: Hearing, tympanic membrane, external ear and ear canal normal.  Left Ear: Hearing, tympanic membrane, external ear and ear canal normal.  Nose: Rhinorrhea and sinus tenderness present. Epistaxis is observed. Right sinus exhibits maxillary sinus tenderness. Right sinus exhibits no frontal sinus tenderness. Left sinus exhibits maxillary sinus tenderness. Left sinus exhibits no frontal sinus tenderness.  Mouth/Throat: Uvula is midline, oropharynx is clear and moist and mucous membranes are normal.  Eyes: Conjunctivae, EOM and lids are normal. Pupils are equal, round, and reactive to light. Right eye exhibits no discharge. Left eye exhibits no discharge. No scleral icterus.  Neck: Normal range of motion. Neck supple. No JVD present. No tracheal deviation present. No thyromegaly present.  Cardiovascular: Normal rate, regular rhythm and intact distal pulses.  Exam reveals no gallop and no friction rub.   No murmur heard. Pulmonary/Chest: Effort normal and breath sounds normal. No stridor. No respiratory distress. She has no wheezes. She has no rales. She exhibits no tenderness.  Musculoskeletal: Normal range of motion.  Lymphadenopathy:    She has no cervical adenopathy.  Neurological: She is alert and oriented to person, place, and time.  Skin: Skin is warm, dry and intact. No rash noted. No erythema. No pallor.  Psychiatric: She has a normal mood and affect. Her speech is normal and behavior is normal. Judgment and thought content  normal. Cognition and memory are normal.  Nursing note and vitals reviewed.   Results for orders placed or performed in visit on 03/26/15  Drugs of abuse scrn w alc, routine urine  Result Value Ref Range   Amphetamines, Urine Negative Cutoff=1000 ng/mL   Barbiturate Quant, Ur Negative Cutoff=300 ng/mL   Benzodiazepine Quant, Ur Negative Cutoff=300 ng/mL   Cannabinoid Quant, Ur Negative Cutoff=50 ng/mL   Cocaine (Metab.) Negative Cutoff=300 ng/mL   Opiate Quant, Ur See Final Results Cutoff=300 ng/mL   PCP Quant, Ur Negative Cutoff=25 ng/mL   Methadone Screen, Urine Negative Cutoff=300 ng/mL   Propoxyphene Negative Cutoff=300 ng/mL   Ethanol U, Quan Negative Cutoff=0.020 %  Opiates Confirmation, Urine  Result Value Ref Range   OPIATES Negative Cutoff=300      Assessment & Plan:   Problem List Items Addressed This Visit    None    Visit Diagnoses    Laryngitis, acute    -  Primary    States that she is no better with prednisone. Now with sinus tenderness. Will treat with augmentin. Call if not getting better or getting worse.         Follow up plan: Return if symptoms worsen or fail to improve.

## 2015-04-19 ENCOUNTER — Other Ambulatory Visit: Payer: Self-pay | Admitting: Unknown Physician Specialty

## 2015-04-19 MED ORDER — HYDROCODONE-ACETAMINOPHEN 5-325 MG PO TABS: 1.0000 | ORAL_TABLET | Freq: Two times a day (BID) | ORAL | Status: DC

## 2015-04-26 ENCOUNTER — Other Ambulatory Visit: Payer: Self-pay

## 2015-04-26 DIAGNOSIS — Z1231 Encounter for screening mammogram for malignant neoplasm of breast: Secondary | ICD-10-CM

## 2015-05-08 DIAGNOSIS — R079 Chest pain, unspecified: Secondary | ICD-10-CM | POA: Diagnosis not present

## 2015-05-08 DIAGNOSIS — E7800 Pure hypercholesterolemia, unspecified: Secondary | ICD-10-CM | POA: Insufficient documentation

## 2015-05-08 DIAGNOSIS — R002 Palpitations: Secondary | ICD-10-CM | POA: Diagnosis not present

## 2015-05-08 DIAGNOSIS — I10 Essential (primary) hypertension: Secondary | ICD-10-CM | POA: Diagnosis not present

## 2015-05-13 ENCOUNTER — Other Ambulatory Visit: Payer: Self-pay | Admitting: Unknown Physician Specialty

## 2015-05-13 ENCOUNTER — Telehealth: Payer: Self-pay | Admitting: Unknown Physician Specialty

## 2015-05-13 DIAGNOSIS — I119 Hypertensive heart disease without heart failure: Secondary | ICD-10-CM

## 2015-05-13 DIAGNOSIS — E785 Hyperlipidemia, unspecified: Secondary | ICD-10-CM

## 2015-05-13 DIAGNOSIS — R7301 Impaired fasting glucose: Secondary | ICD-10-CM

## 2015-05-13 DIAGNOSIS — F419 Anxiety disorder, unspecified: Secondary | ICD-10-CM

## 2015-05-13 DIAGNOSIS — E7800 Pure hypercholesterolemia, unspecified: Secondary | ICD-10-CM

## 2015-05-13 DIAGNOSIS — I10 Essential (primary) hypertension: Secondary | ICD-10-CM

## 2015-05-13 MED ORDER — ALPRAZOLAM 0.25 MG PO TABS: 0.2500 mg | ORAL_TABLET | Freq: Every evening | ORAL | Status: DC | PRN

## 2015-05-13 MED ORDER — LOSARTAN POTASSIUM-HCTZ 100-12.5 MG PO TABS: 1.0000 | ORAL_TABLET | Freq: Every day | ORAL | Status: DC

## 2015-05-13 MED ORDER — ATORVASTATIN CALCIUM 10 MG PO TABS: 10.0000 mg | ORAL_TABLET | Freq: Every day | ORAL | Status: DC

## 2015-05-13 NOTE — Telephone Encounter (Signed)
Called and spoke to patient. She states she is leaving to go out of town on the 11th and that she needs all medications refilled. I asked the patient if she would like to schedule an appointment. Patient states she would like to come in for labs before she goes out of town and schedule an appointment with Malachy Mood when she gets back into town. Would you be willing to do this Malachy Mood? If so, please enter future lab orders, I will schedule the patient a lab visit before she goes out of town and a follow up with you when she returns.

## 2015-05-13 NOTE — Telephone Encounter (Signed)
Pt called stated she will be out of meds soon. Pt offered 2 afternoon appointments. Pt stated she needs lab work but there are no labs listed. Pt wants to know if she can come in for labs and do an appt later. Pt is leaving for [place] for 2 weeks on Friday and does not want to run out of meds while gone. Pharm is [facility]. Please advise. Thanks.

## 2015-05-13 NOTE — Telephone Encounter (Signed)
Called and spoke to patient. I spoke to Tamara Fleming in the middle of the phone call. Malachy Mood put in orders for patient and refilled medications for one month. Patient scheduled a lab visit for 05/15/15 and an office visit for 06/04/15.

## 2015-05-13 NOTE — Telephone Encounter (Signed)
I would rather fill her meds and will do labs when she gets back.  Which ones does she need?  I see both Losartan and Valsartan in her chart.  She should not be on both

## 2015-05-14 ENCOUNTER — Ambulatory Visit
Admission: RE | Admit: 2015-05-14 | Discharge: 2015-05-14 | Disposition: A | Discharge status: Home / Self Care | Payer: Medicare Other | Source: Ambulatory Visit

## 2015-05-14 ENCOUNTER — Other Ambulatory Visit: Payer: Self-pay | Admitting: Unknown Physician Specialty

## 2015-05-14 DIAGNOSIS — Z1231 Encounter for screening mammogram for malignant neoplasm of breast: Secondary | ICD-10-CM

## 2015-05-14 MED ORDER — HYDROCODONE-ACETAMINOPHEN 5-325 MG PO TABS: 1.0000 | ORAL_TABLET | Freq: Two times a day (BID) | ORAL | Status: DC

## 2015-05-15 ENCOUNTER — Other Ambulatory Visit: Payer: Medicare Other

## 2015-05-15 DIAGNOSIS — I119 Hypertensive heart disease without heart failure: Secondary | ICD-10-CM

## 2015-05-15 DIAGNOSIS — E785 Hyperlipidemia, unspecified: Secondary | ICD-10-CM

## 2015-05-15 DIAGNOSIS — R7301 Impaired fasting glucose: Secondary | ICD-10-CM

## 2015-05-15 DIAGNOSIS — I10 Essential (primary) hypertension: Secondary | ICD-10-CM

## 2015-05-15 LAB — LIPID PANEL PICCOLO, WAIVED
CHOL/HDL RATIO PICCOLO,WAIVE: 2.7 mg/dL
Cholesterol Piccolo, Waived: 189 mg/dL (ref <200)
HDL Chol Piccolo, Waived: 69 mg/dL (ref >59)
LDL Chol Calc Piccolo Waived: 89 mg/dL (ref <100)
TRIGLYCERIDES PICCOLO,WAIVED: 157 mg/dL — AB (ref <150)
VLDL CHOL CALC PICCOLO,WAIVE: 31 mg/dL — AB (ref <30)

## 2015-05-15 LAB — BAYER DCA HB A1C WAIVED: HB A1C: 5.9 % (ref <7.0)

## 2015-05-16 ENCOUNTER — Ambulatory Visit: Payer: Medicare Other

## 2015-05-16 LAB — COMPREHENSIVE METABOLIC PANEL
A/G RATIO: 1.7 (ref 1.1–2.5)
ALT: 23 IU/L (ref 0–32)
AST: 24 IU/L (ref 0–40)
Albumin: 4.4 g/dL (ref 3.6–4.8)
Alkaline Phosphatase: 83 IU/L (ref 39–117)
BUN/Creatinine Ratio: 14 (ref 11–26)
BUN: 13 mg/dL (ref 8–27)
Bilirubin Total: 0.9 mg/dL (ref 0.0–1.2)
CALCIUM: 9.6 mg/dL (ref 8.7–10.3)
CO2: 24 mmol/L (ref 18–29)
CREATININE: 0.9 mg/dL (ref 0.57–1.00)
Chloride: 98 mmol/L (ref 96–106)
GFR calc Af Amer: 76 mL/min/{1.73_m2} (ref >59)
GFR, EST NON AFRICAN AMERICAN: 66 mL/min/{1.73_m2} (ref >59)
GLOBULIN, TOTAL: 2.6 g/dL (ref 1.5–4.5)
Glucose: 109 mg/dL — ABNORMAL HIGH (ref 65–99)
Potassium: 4.3 mmol/L (ref 3.5–5.2)
SODIUM: 141 mmol/L (ref 134–144)
Total Protein: 7 g/dL (ref 6.0–8.5)

## 2015-05-16 LAB — URIC ACID: URIC ACID: 7.5 mg/dL — AB (ref 2.5–7.1)

## 2015-05-29 ENCOUNTER — Other Ambulatory Visit: Payer: Self-pay | Admitting: Unknown Physician Specialty

## 2015-05-29 DIAGNOSIS — F419 Anxiety disorder, unspecified: Secondary | ICD-10-CM

## 2015-05-29 MED ORDER — HYDROCODONE-ACETAMINOPHEN 5-325 MG PO TABS: 1.0000 | ORAL_TABLET | Freq: Two times a day (BID) | ORAL | Status: DC

## 2015-05-29 MED ORDER — ALPRAZOLAM 0.25 MG PO TABS: 0.2500 mg | ORAL_TABLET | Freq: Every evening | ORAL | Status: DC | PRN

## 2015-06-04 ENCOUNTER — Ambulatory Visit (INDEPENDENT_AMBULATORY_CARE_PROVIDER_SITE_OTHER): Payer: Medicare Other | Admitting: Unknown Physician Specialty

## 2015-06-04 ENCOUNTER — Encounter: Payer: Self-pay | Admitting: Unknown Physician Specialty

## 2015-06-04 VITALS — BP 134/80 | HR 71 | Temp 97.8°F | Ht 64.2 in | Wt 174.2 lb

## 2015-06-04 DIAGNOSIS — I10 Essential (primary) hypertension: Secondary | ICD-10-CM

## 2015-06-04 DIAGNOSIS — E7849 Other hyperlipidemia: Secondary | ICD-10-CM | POA: Insufficient documentation

## 2015-06-04 DIAGNOSIS — M25552 Pain in left hip: Secondary | ICD-10-CM

## 2015-06-04 DIAGNOSIS — R7301 Impaired fasting glucose: Secondary | ICD-10-CM | POA: Insufficient documentation

## 2015-06-04 DIAGNOSIS — F419 Anxiety disorder, unspecified: Secondary | ICD-10-CM | POA: Diagnosis not present

## 2015-06-04 DIAGNOSIS — E785 Hyperlipidemia, unspecified: Secondary | ICD-10-CM

## 2015-06-04 DIAGNOSIS — I119 Hypertensive heart disease without heart failure: Secondary | ICD-10-CM | POA: Diagnosis not present

## 2015-06-04 DIAGNOSIS — G8929 Other chronic pain: Secondary | ICD-10-CM | POA: Diagnosis not present

## 2015-06-04 DIAGNOSIS — M25559 Pain in unspecified hip: Secondary | ICD-10-CM | POA: Insufficient documentation

## 2015-06-04 DIAGNOSIS — E7800 Pure hypercholesterolemia, unspecified: Secondary | ICD-10-CM | POA: Diagnosis not present

## 2015-06-04 MED ORDER — LOSARTAN POTASSIUM-HCTZ 100-12.5 MG PO TABS: 1.0000 | ORAL_TABLET | Freq: Every day | ORAL | Status: DC

## 2015-06-04 MED ORDER — ALPRAZOLAM 0.25 MG PO TABS: 0.2500 mg | ORAL_TABLET | Freq: Every evening | ORAL | Status: DC | PRN

## 2015-06-04 MED ORDER — ATORVASTATIN CALCIUM 10 MG PO TABS: 10.0000 mg | ORAL_TABLET | Freq: Every day | ORAL | Status: DC

## 2015-06-04 NOTE — Assessment & Plan Note (Signed)
Currently controlled with chronic opioid use.  Discussed options of a hip replacement.  Pt  Not ready at this time and will discuss again in 6 months

## 2015-06-04 NOTE — Assessment & Plan Note (Signed)
Stable, continue present medications.   

## 2015-06-04 NOTE — Assessment & Plan Note (Signed)
Hgb A1C is 5.9.  Discussed diet and exercise

## 2015-06-04 NOTE — Progress Notes (Signed)
BP 134/80 mmHg  Pulse 71  Temp(Src) 97.8 F (36.6 C)  Ht 5' 4.2" (1.631 m)  Wt 174 lb 3.2 oz (79.017 kg)  BMI 29.70 kg/m2  SpO2 98%  LMP  (LMP Unknown)   Subjective:    Patient ID: Tamara Fleming, female    DOB: 1946/01/25, 70 y.o.   MRN: [id number]  HPI: Tamara Fleming is a 70 y.o. female  Chief Complaint  Patient presents with  . Hyperlipidemia  . Hypertension  . Immunizations    pt states she may be interested in the shingles and tetanus vaccines, but she has medicare   Hypertension Using medications without difficulty Average home BPs Not checking  No problems or lightheadedness No chest pain with exertion or shortness of breath No Edema  Hyperlipidemia Using medications without problems: No Muscle aches  Diet compliance: often skips meals Exercise: does not exercise much due to hip issues  Chronic pain Pt with left sided hip pain.  She describes the majority of pain in the anterior hip and radiates down leg.  She states it is worse and resistant to getting a hip replacement and it is unclear if that is the problem.  She is taking Hydrocodone BID and has been on the same dose for quite some time.  She states the pain meds allow her to play golf and do housework.    Anxiety/insomnia Takes Xanax on a regular basis and averages about 4 per week.  She does not take it and drive.  Mostly uses it at night and when flying.    Relevant past medical, surgical, family and social history reviewed and updated as indicated. Interim medical history since our last visit reviewed. Allergies and medications reviewed and updated.  Review of Systems  Per HPI unless specifically indicated above     Objective:    BP 134/80 mmHg  Pulse 71  Temp(Src) 97.8 F (36.6 C)  Ht 5' 4.2" (1.631 m)  Wt 174 lb 3.2 oz (79.017 kg)  BMI 29.70 kg/m2  SpO2 98%  LMP  (LMP Unknown)  Wt Readings from Last 3 Encounters:  06/04/15 174 lb 3.2 oz (79.017 kg)  04/17/15 172 lb (78.019 kg)   04/12/15 174 lb (78.926 kg)    Physical Exam  Constitutional: She is oriented to person, place, and time. She appears well-developed and well-nourished. No distress.  HENT:  Head: Normocephalic and atraumatic.  Eyes: Conjunctivae and lids are normal. Right eye exhibits no discharge. Left eye exhibits no discharge. No scleral icterus.  Neck: Normal range of motion. Neck supple. No JVD present. Carotid bruit is not present.  Cardiovascular: Normal rate, regular rhythm and normal heart sounds.   Pulmonary/Chest: Effort normal and breath sounds normal.  Abdominal: Normal appearance. There is no splenomegaly or hepatomegaly.  Musculoskeletal: Normal range of motion.  Neurological: She is alert and oriented to person, place, and time.  Skin: Skin is warm, dry and intact. No rash noted. No pallor.  Psychiatric: She has a normal mood and affect. Her behavior is normal. Judgment and thought content normal.   Labs done before her visit reviewed.      Assessment & Plan:   Problem List Items Addressed This Visit      Unprioritized   Dyslipidemia    Stable, continue present medications.        Essential hypertension, benign - Primary    Stable, continue present medications.        Hyperlipidemia   Chronic left hip pain  Currently controlled with chronic opioid use.  Discussed options of a hip replacement.  Pt  Not ready at this time and will discuss again in 6 months      IFG (impaired fasting glucose)    Hgb A1C is 5.9.  Discussed diet and exercise          Follow up plan: Return in about 6 months (around 12/02/2015) for physical.

## 2015-06-11 DIAGNOSIS — N62 Hypertrophy of breast: Secondary | ICD-10-CM | POA: Diagnosis not present

## 2015-06-11 HISTORY — PX: REDUCTION MAMMAPLASTY: SUR839

## 2015-06-13 ENCOUNTER — Encounter: Payer: Self-pay | Admitting: *Deleted

## 2015-07-12 ENCOUNTER — Other Ambulatory Visit: Payer: Self-pay | Admitting: Unknown Physician Specialty

## 2015-07-12 MED ORDER — HYDROCODONE-ACETAMINOPHEN 5-325 MG PO TABS: 1.0000 | ORAL_TABLET | Freq: Two times a day (BID) | ORAL | Status: DC

## 2015-08-05 DIAGNOSIS — R21 Rash and other nonspecific skin eruption: Secondary | ICD-10-CM | POA: Diagnosis not present

## 2015-08-05 DIAGNOSIS — L239 Allergic contact dermatitis, unspecified cause: Secondary | ICD-10-CM | POA: Diagnosis not present

## 2015-08-06 ENCOUNTER — Other Ambulatory Visit: Payer: Self-pay | Admitting: Unknown Physician Specialty

## 2015-08-06 MED ORDER — HYDROCODONE-ACETAMINOPHEN 5-325 MG PO TABS: 1.0000 | ORAL_TABLET | Freq: Two times a day (BID) | ORAL | Status: DC

## 2015-09-03 ENCOUNTER — Other Ambulatory Visit: Payer: Self-pay | Admitting: Unknown Physician Specialty

## 2015-09-03 MED ORDER — HYDROCODONE-ACETAMINOPHEN 5-325 MG PO TABS: 1.0000 | ORAL_TABLET | Freq: Two times a day (BID) | ORAL | Status: DC

## 2015-09-10 ENCOUNTER — Ambulatory Visit (INDEPENDENT_AMBULATORY_CARE_PROVIDER_SITE_OTHER): Payer: Medicare Other | Admitting: Unknown Physician Specialty

## 2015-09-10 ENCOUNTER — Encounter: Payer: Self-pay | Admitting: Unknown Physician Specialty

## 2015-09-10 VITALS — BP 126/80 | HR 66 | Temp 98.0°F | Ht 64.6 in | Wt 175.0 lb

## 2015-09-10 DIAGNOSIS — R7301 Impaired fasting glucose: Secondary | ICD-10-CM

## 2015-09-10 DIAGNOSIS — R5382 Chronic fatigue, unspecified: Secondary | ICD-10-CM | POA: Diagnosis not present

## 2015-09-10 DIAGNOSIS — R42 Dizziness and giddiness: Secondary | ICD-10-CM

## 2015-09-10 DIAGNOSIS — R110 Nausea: Secondary | ICD-10-CM | POA: Diagnosis not present

## 2015-09-10 LAB — CBC WITH DIFFERENTIAL/PLATELET
Hematocrit: 35.3 % (ref 34.0–46.6)
Hemoglobin: 12.4 g/dL (ref 11.1–15.9)
LYMPHS: 42 %
Lymphocytes Absolute: 2.2 10*3/uL (ref 0.7–3.1)
MCH: 35.1 pg — AB (ref 26.6–33.0)
MCHC: 35.1 g/dL (ref 31.5–35.7)
MCV: 100 fL — AB (ref 79–97)
MID (Absolute): 0.7 10*3/uL (ref 0.1–1.6)
MID: 12 %
NEUTROS ABS: 2.4 10*3/uL (ref 1.4–7.0)
NEUTROS PCT: 46 %
PLATELETS: 208 10*3/uL (ref 150–379)
RBC: 3.53 x10E6/uL — ABNORMAL LOW (ref 3.77–5.28)
RDW: 13.7 % (ref 12.3–15.4)
WBC: 5.3 10*3/uL (ref 3.4–10.8)

## 2015-09-10 LAB — GLUCOSE HEMOCUE WAIVED: Glu Hemocue Waived: 101 mg/dL — ABNORMAL HIGH (ref 65–99)

## 2015-09-10 LAB — BAYER DCA HB A1C WAIVED: HB A1C: 6.1 % (ref <7.0)

## 2015-09-10 NOTE — Progress Notes (Signed)
BP 126/80 mmHg  Pulse 66  Temp(Src) 98 F (36.7 C)  Ht 5' 4.6" (1.641 m)  Wt 175 lb (79.379 kg)  BMI 29.48 kg/m2  SpO2 96%  LMP  (LMP Unknown)   Subjective:    Patient ID: Tamara Fleming, female    DOB: Jul 21, 1945, 70 y.o.   MRN: [id number]  HPI: Tamara Fleming is a 70 y.o. female  Chief Complaint  Patient presents with  . Dizziness    pt states she was on vacation and every time she would bend over, she would get dizzy and about pass out. Pt states she also had some vomiting and wonders if she may be allergic to shellfiish  . Labs Only    pt states she would like to be tested to see if she is allergic to shellfish   Pt states that the above happens every time she plays golf.  She adds that she "saw white" when she bent over.  States she is nauseated a lot.  States before this happened, she was "violently ill."  States she was sick all week.  She is better, but if she turns over and stands up to quick she feels a little off balance.  In the last 6 months she had an echo and a stress test. States she doesn't feel good, just wants to lie down, and not motivated for anything.    Relevant past medical, surgical, family and social history reviewed and updated as indicated. Interim medical history since our last visit reviewed. Allergies and medications reviewed and updated.  Review of Systems  Per HPI unless specifically indicated above     Objective:    BP 126/80 mmHg  Pulse 66  Temp(Src) 98 F (36.7 C)  Ht 5' 4.6" (1.641 m)  Wt 175 lb (79.379 kg)  BMI 29.48 kg/m2  SpO2 96%  LMP  (LMP Unknown)  Wt Readings from Last 3 Encounters:  09/10/15 175 lb (79.379 kg)  06/04/15 174 lb 3.2 oz (79.017 kg)  04/17/15 172 lb (78.019 kg)    Physical Exam  Constitutional: She is oriented to person, place, and time. She appears well-developed and well-nourished. No distress.  HENT:  Head: Normocephalic and atraumatic.  Eyes: Conjunctivae and lids are normal. Right eye exhibits  no discharge. Left eye exhibits no discharge. No scleral icterus.  Neck: Normal range of motion. Neck supple. No JVD present. Carotid bruit is not present.  Cardiovascular: Normal rate and regular rhythm.   Murmur heard.  Systolic murmur is present with a grade of 3/6  Pulmonary/Chest: Effort normal and breath sounds normal.  Abdominal: Normal appearance. There is no splenomegaly or hepatomegaly.  Musculoskeletal: Normal range of motion.  Neurological: She is alert and oriented to person, place, and time.  Skin: Skin is warm, dry and intact. No rash noted. No pallor.  Psychiatric: She has a normal mood and affect. Her behavior is normal. Judgment and thought content normal.      Assessment & Plan:   Problem List Items Addressed This Visit      Unprioritized   Fatigue   Relevant Orders   CBC With Differential/Platelet   Comprehensive metabolic panel   Glucose Hemocue Waived   Ambulatory referral to Allergy   IFG (impaired fasting glucose)   Relevant Orders   Bayer DCA Hb A1c Waived   Glucose Hemocue Waived    Other Visit Diagnoses    Nausea    -  Primary    Relevant Orders  Glucose Hemocue Waived    Ambulatory referral to Allergy    Dizzy        Relevant Orders    Glucose Hemocue Waived       Encouraged to eat small frequent meals with protein the next few days.    Follow up plan: Return in about 4 weeks (around 10/08/2015).

## 2015-09-11 ENCOUNTER — Other Ambulatory Visit: Payer: Self-pay | Admitting: Unknown Physician Specialty

## 2015-09-11 DIAGNOSIS — R5383 Other fatigue: Secondary | ICD-10-CM

## 2015-09-11 LAB — COMPREHENSIVE METABOLIC PANEL
ALT: 18 IU/L (ref 0–32)
AST: 21 IU/L (ref 0–40)
Albumin/Globulin Ratio: 1.8 (ref 1.2–2.2)
Albumin: 4.4 g/dL (ref 3.6–4.8)
Alkaline Phosphatase: 78 IU/L (ref 39–117)
BUN/Creatinine Ratio: 17 (ref 12–28)
BUN: 17 mg/dL (ref 8–27)
Bilirubin Total: 0.4 mg/dL (ref 0.0–1.2)
CALCIUM: 9.9 mg/dL (ref 8.7–10.3)
CO2: 26 mmol/L (ref 18–29)
CREATININE: 0.98 mg/dL (ref 0.57–1.00)
Chloride: 101 mmol/L (ref 96–106)
GFR calc Af Amer: 68 mL/min/{1.73_m2} (ref >59)
GFR, EST NON AFRICAN AMERICAN: 59 mL/min/{1.73_m2} — AB (ref >59)
Globulin, Total: 2.4 g/dL (ref 1.5–4.5)
Glucose: 106 mg/dL — ABNORMAL HIGH (ref 65–99)
Potassium: 5 mmol/L (ref 3.5–5.2)
Sodium: 142 mmol/L (ref 134–144)
Total Protein: 6.8 g/dL (ref 6.0–8.5)

## 2015-09-12 ENCOUNTER — Encounter: Payer: Self-pay | Admitting: Unknown Physician Specialty

## 2015-09-12 LAB — TSH: TSH: 3.33 u[IU]/mL (ref 0.450–4.500)

## 2015-09-12 LAB — SPECIMEN STATUS REPORT

## 2015-09-26 ENCOUNTER — Other Ambulatory Visit: Payer: Self-pay | Admitting: Unknown Physician Specialty

## 2015-09-26 DIAGNOSIS — J305 Allergic rhinitis due to food: Secondary | ICD-10-CM | POA: Diagnosis not present

## 2015-09-27 ENCOUNTER — Other Ambulatory Visit: Payer: Self-pay | Admitting: Unknown Physician Specialty

## 2015-09-27 DIAGNOSIS — F419 Anxiety disorder, unspecified: Secondary | ICD-10-CM

## 2015-09-27 MED ORDER — HYDROCODONE-ACETAMINOPHEN 5-325 MG PO TABS: 1.0000 | ORAL_TABLET | Freq: Two times a day (BID) | ORAL | Status: DC

## 2015-09-27 MED ORDER — ALPRAZOLAM 0.25 MG PO TABS: 0.2500 mg | ORAL_TABLET | Freq: Every evening | ORAL | Status: DC | PRN

## 2015-10-15 DIAGNOSIS — H43813 Vitreous degeneration, bilateral: Secondary | ICD-10-CM | POA: Diagnosis not present

## 2015-10-30 ENCOUNTER — Other Ambulatory Visit: Payer: Self-pay | Admitting: Unknown Physician Specialty

## 2015-10-30 MED ORDER — HYDROCODONE-ACETAMINOPHEN 5-325 MG PO TABS: 1.0000 | ORAL_TABLET | Freq: Two times a day (BID) | ORAL | 0 refills | Status: DC

## 2015-11-01 ENCOUNTER — Other Ambulatory Visit: Payer: Self-pay | Admitting: Orthopedic Surgery

## 2015-11-01 DIAGNOSIS — M25552 Pain in left hip: Secondary | ICD-10-CM | POA: Diagnosis not present

## 2015-11-01 DIAGNOSIS — M545 Low back pain: Secondary | ICD-10-CM | POA: Diagnosis not present

## 2015-11-05 DIAGNOSIS — R079 Chest pain, unspecified: Secondary | ICD-10-CM | POA: Diagnosis not present

## 2015-11-05 DIAGNOSIS — I10 Essential (primary) hypertension: Secondary | ICD-10-CM | POA: Diagnosis not present

## 2015-11-05 DIAGNOSIS — E7800 Pure hypercholesterolemia, unspecified: Secondary | ICD-10-CM | POA: Diagnosis not present

## 2015-11-08 ENCOUNTER — Other Ambulatory Visit: Payer: Self-pay | Admitting: Unknown Physician Specialty

## 2015-11-08 MED ORDER — HYDROCODONE-ACETAMINOPHEN 5-325 MG PO TABS: 1.0000 | ORAL_TABLET | Freq: Two times a day (BID) | ORAL | 0 refills | Status: DC

## 2015-11-09 ENCOUNTER — Ambulatory Visit
Admission: RE | Admit: 2015-11-09 | Discharge: 2015-11-09 | Disposition: A | Discharge status: Home / Self Care | Payer: Medicare Other | Source: Ambulatory Visit | Attending: Orthopedic Surgery | Admitting: Orthopedic Surgery

## 2015-11-09 DIAGNOSIS — M4806 Spinal stenosis, lumbar region: Secondary | ICD-10-CM | POA: Diagnosis not present

## 2015-11-09 DIAGNOSIS — M545 Low back pain: Secondary | ICD-10-CM

## 2015-11-12 DIAGNOSIS — M546 Pain in thoracic spine: Secondary | ICD-10-CM | POA: Diagnosis not present

## 2015-11-12 DIAGNOSIS — M7062 Trochanteric bursitis, left hip: Secondary | ICD-10-CM | POA: Diagnosis not present

## 2015-12-12 ENCOUNTER — Other Ambulatory Visit: Payer: Self-pay | Admitting: Unknown Physician Specialty

## 2015-12-12 NOTE — Telephone Encounter (Signed)
Pt would like a refill on losartan-hydrochlorothiazide (HYZAAR) 100-12.5 MG tablet sent to [facility].

## 2015-12-13 NOTE — Telephone Encounter (Signed)
Spoke with [facility], patient has Rx on hold, they will get ready for her.  Spoke with patient and let her know

## 2015-12-19 ENCOUNTER — Other Ambulatory Visit: Payer: Self-pay | Admitting: Unknown Physician Specialty

## 2015-12-19 DIAGNOSIS — F419 Anxiety disorder, unspecified: Secondary | ICD-10-CM

## 2015-12-19 MED ORDER — HYDROCODONE-ACETAMINOPHEN 5-325 MG PO TABS: 1.0000 | ORAL_TABLET | Freq: Two times a day (BID) | ORAL | 0 refills | Status: DC

## 2015-12-19 MED ORDER — ALPRAZOLAM 0.25 MG PO TABS: 0.2500 mg | ORAL_TABLET | Freq: Every evening | ORAL | 2 refills | Status: DC | PRN

## 2016-01-21 ENCOUNTER — Ambulatory Visit (INDEPENDENT_AMBULATORY_CARE_PROVIDER_SITE_OTHER): Payer: Medicare Other

## 2016-01-21 DIAGNOSIS — Z23 Encounter for immunization: Secondary | ICD-10-CM | POA: Diagnosis not present

## 2016-01-27 ENCOUNTER — Other Ambulatory Visit: Payer: Self-pay | Admitting: Unknown Physician Specialty

## 2016-01-27 MED ORDER — HYDROCODONE-ACETAMINOPHEN 5-325 MG PO TABS: 1.0000 | ORAL_TABLET | Freq: Two times a day (BID) | ORAL | 0 refills | Status: DC

## 2016-02-19 ENCOUNTER — Other Ambulatory Visit: Payer: Self-pay | Admitting: Unknown Physician Specialty

## 2016-02-19 MED ORDER — HYDROCODONE-ACETAMINOPHEN 5-325 MG PO TABS: 1.0000 | ORAL_TABLET | Freq: Two times a day (BID) | ORAL | 0 refills | Status: DC

## 2016-03-20 ENCOUNTER — Other Ambulatory Visit: Payer: Self-pay | Admitting: Unknown Physician Specialty

## 2016-03-20 MED ORDER — HYDROCODONE-ACETAMINOPHEN 5-325 MG PO TABS: 1.0000 | ORAL_TABLET | Freq: Two times a day (BID) | ORAL | 0 refills | Status: DC

## 2016-04-21 ENCOUNTER — Telehealth: Payer: Self-pay

## 2016-04-21 ENCOUNTER — Other Ambulatory Visit: Payer: Self-pay | Admitting: Unknown Physician Specialty

## 2016-04-21 MED ORDER — HYDROCODONE-ACETAMINOPHEN 5-325 MG PO TABS: 1.0000 | ORAL_TABLET | Freq: Two times a day (BID) | ORAL | 0 refills | Status: DC

## 2016-04-21 NOTE — Telephone Encounter (Signed)
Patient here for 28 day prescription.

## 2016-04-28 ENCOUNTER — Ambulatory Visit (INDEPENDENT_AMBULATORY_CARE_PROVIDER_SITE_OTHER): Payer: Medicare Other | Admitting: Unknown Physician Specialty

## 2016-04-28 ENCOUNTER — Encounter: Payer: Self-pay | Admitting: Unknown Physician Specialty

## 2016-04-28 VITALS — BP 134/81 | HR 90 | Temp 98.5°F | Wt 176.4 lb

## 2016-04-28 DIAGNOSIS — M25552 Pain in left hip: Secondary | ICD-10-CM

## 2016-04-28 DIAGNOSIS — R1030 Lower abdominal pain, unspecified: Secondary | ICD-10-CM

## 2016-04-28 DIAGNOSIS — G8929 Other chronic pain: Secondary | ICD-10-CM | POA: Diagnosis not present

## 2016-04-28 LAB — CBC WITH DIFFERENTIAL/PLATELET
Hematocrit: 37.8 % (ref 34.0–46.6)
Hemoglobin: 12.9 g/dL (ref 11.1–15.9)
LYMPHS: 19 %
Lymphocytes Absolute: 1.9 10*3/uL (ref 0.7–3.1)
MCH: 34.6 pg — ABNORMAL HIGH (ref 26.6–33.0)
MCHC: 34.1 g/dL (ref 31.5–35.7)
MCV: 101 fL — AB (ref 79–97)
MID (ABSOLUTE): 1.5 10*3/uL (ref 0.1–1.6)
MID: 16 %
Neutrophils Absolute: 6.3 10*3/uL (ref 1.4–7.0)
Neutrophils: 65 %
Platelets: 188 10*3/uL (ref 150–379)
RBC: 3.73 x10E6/uL — AB (ref 3.77–5.28)
RDW: 12.6 % (ref 12.3–15.4)
WBC: 9.7 10*3/uL (ref 3.4–10.8)

## 2016-04-28 MED ORDER — AMOXICILLIN-POT CLAVULANATE 875-125 MG PO TABS: 1.0000 | ORAL_TABLET | Freq: Two times a day (BID) | ORAL | 0 refills | Status: DC

## 2016-04-28 MED ORDER — METRONIDAZOLE 500 MG PO TABS: 500.0000 mg | ORAL_TABLET | Freq: Two times a day (BID) | ORAL | 0 refills | Status: DC

## 2016-04-28 MED ORDER — HYDROCODONE-ACETAMINOPHEN 5-325 MG PO TABS: 1.0000 | ORAL_TABLET | Freq: Four times a day (QID) | ORAL | 0 refills | Status: DC | PRN

## 2016-04-28 NOTE — Assessment & Plan Note (Signed)
Refer to pain clinic for continued management

## 2016-04-28 NOTE — Progress Notes (Signed)
BP 134/81 (BP Location: Left Arm, Patient Position: Sitting, Cuff Size: Normal)   Pulse 90   Temp 98.5 F (36.9 C)   Wt 176 lb 6.4 oz (80 kg)   LMP  (LMP Unknown)   SpO2 96%   BMI 29.72 kg/m    Subjective:    Patient ID: Tamara Fleming, female    DOB: 07/08/1945, 71 y.o.   MRN: [id number]  HPI: Tamara Fleming is a 71 y.o. female  Chief Complaint  Patient presents with  . Abdominal Pain    pt states she has been having lower abdominal pain for a few days, states it is usually just on left side but is all the way across abdomen this time. States she thinks it may be a diverticulitis flare but has also had some pressure with urination   Abdominal Pain  This is a new problem. The current episode started yesterday. The onset quality is sudden. The problem occurs constantly. The problem has been gradually worsening. The pain is located in the suprapubic region. The pain is severe. The quality of the pain is cramping. The abdominal pain does not radiate. Associated symptoms include constipation. Pertinent negatives include no diarrhea, dysuria, nausea or vomiting.   Pt has a significant history of diverticulitis. She is allergic to Cipro.     Chronic pain Pt taking Hydrocodone BID for chronic hip and back pain  Relevant past medical, surgical, family and social history reviewed and updated as indicated. Interim medical history since our last visit reviewed. Allergies and medications reviewed and updated.  Review of Systems  Gastrointestinal: Positive for abdominal pain and constipation. Negative for diarrhea, nausea and vomiting.  Genitourinary: Negative for dysuria.    Per HPI unless specifically indicated above     Objective:    BP 134/81 (BP Location: Left Arm, Patient Position: Sitting, Cuff Size: Normal)   Pulse 90   Temp 98.5 F (36.9 C)   Wt 176 lb 6.4 oz (80 kg)   LMP  (LMP Unknown)   SpO2 96%   BMI 29.72 kg/m   Wt Readings from Last 3 Encounters:  04/28/16  176 lb 6.4 oz (80 kg)  09/10/15 175 lb (79.4 kg)  06/04/15 174 lb 3.2 oz (79 kg)    Physical Exam  Constitutional: She is oriented to person, place, and time. She appears well-developed and well-nourished. No distress.  HENT:  Head: Normocephalic and atraumatic.  Eyes: Conjunctivae and lids are normal. Right eye exhibits no discharge. Left eye exhibits no discharge. No scleral icterus.  Neck: Normal range of motion. Neck supple. No JVD present. Carotid bruit is not present.  Cardiovascular: Normal rate, regular rhythm and normal heart sounds.   Pulmonary/Chest: Effort normal and breath sounds normal.  Abdominal: Normal appearance. She exhibits no distension, no abdominal bruit and no ascites. There is no splenomegaly or hepatomegaly. There is tenderness in the right lower quadrant, suprapubic area and left lower quadrant. There is no rigidity, no rebound, no guarding, no CVA tenderness, no tenderness at McBurney's point and negative Murphy's sign.  Musculoskeletal: Normal range of motion.  Neurological: She is alert and oriented to person, place, and time.  Skin: Skin is warm, dry and intact. No rash noted. No pallor.  Psychiatric: She has a normal mood and affect. Her behavior is normal. Judgment and thought content normal.       Assessment & Plan:   Problem List Items Addressed This Visit      Unprioritized   Chronic  left hip pain    Refer to pain clinic for continued management       Other Visit Diagnoses    Lower abdominal pain    -  Primary   Urine is positive.  I suspect diverticulitis.  WBC is normal.  Treat with Augmentin and Flagyl   Relevant Orders   UA/M w/rflx Culture, Routine   CBC With Differential/Platelet      Will  rx additional Hydrocodone #20 due to pain.    Follow up plan: No Follow-up on file.

## 2016-04-30 LAB — UA/M W/RFLX CULTURE, ROUTINE
BILIRUBIN UA: NEGATIVE
Glucose, UA: NEGATIVE
KETONES UA: NEGATIVE
Nitrite, UA: NEGATIVE
Specific Gravity, UA: 1.02 (ref 1.005–1.030)
UUROB: 0.2 mg/dL (ref 0.2–1.0)
pH, UA: 6 (ref 5.0–7.5)

## 2016-04-30 LAB — MICROSCOPIC EXAMINATION: RBC, UA: NONE SEEN /hpf (ref 0–?)

## 2016-04-30 LAB — URINE CULTURE, REFLEX

## 2016-05-01 ENCOUNTER — Telehealth: Payer: Self-pay | Admitting: Unknown Physician Specialty

## 2016-05-01 NOTE — Telephone Encounter (Signed)
It was negative.

## 2016-05-01 NOTE — Telephone Encounter (Signed)
Patient called to see if Malachy Mood had the results from the patients urinalysis.   Thank You   [phone]

## 2016-05-01 NOTE — Telephone Encounter (Signed)
Called and let patient know about her urine. She states she is still in a lot of pain and cannot sleep very much. Placed patient on hold and let Malachy Mood know what patient said. Malachy Mood stated that if the patient is in that much pain and cannot sleep, then she should go to the ER. I let patient know that Malachy Mood said. Patient stated she really did not want to go to the ER and stated that she may wait to see how she feels in the morning.

## 2016-05-01 NOTE — Telephone Encounter (Signed)
Routing to provider  

## 2016-05-20 ENCOUNTER — Other Ambulatory Visit: Payer: Self-pay | Admitting: Unknown Physician Specialty

## 2016-05-20 MED ORDER — HYDROCODONE-ACETAMINOPHEN 5-325 MG PO TABS: 1.0000 | ORAL_TABLET | Freq: Two times a day (BID) | ORAL | 0 refills | Status: DC

## 2016-06-16 ENCOUNTER — Other Ambulatory Visit: Payer: Self-pay | Admitting: Unknown Physician Specialty

## 2016-06-16 MED ORDER — HYDROCODONE-ACETAMINOPHEN 5-325 MG PO TABS: 1.0000 | ORAL_TABLET | Freq: Two times a day (BID) | ORAL | 0 refills | Status: DC

## 2016-06-22 ENCOUNTER — Telehealth: Payer: Self-pay | Admitting: Unknown Physician Specialty

## 2016-06-22 NOTE — Telephone Encounter (Signed)
Called and let patient know that medication was at the pharmacy for her to pick up.

## 2016-06-22 NOTE — Telephone Encounter (Signed)
Called pharmacy because according to chart, patient should still have one refill for 90 day supply. Pharmacy stated that the patient does have this refill on file and they are getting it ready for her to pick up. Will call patient and let her know.

## 2016-07-17 ENCOUNTER — Other Ambulatory Visit: Payer: Self-pay | Admitting: Unknown Physician Specialty

## 2016-07-17 DIAGNOSIS — F419 Anxiety disorder, unspecified: Secondary | ICD-10-CM

## 2016-07-17 MED ORDER — HYDROCODONE-ACETAMINOPHEN 5-325 MG PO TABS: 1.0000 | ORAL_TABLET | Freq: Two times a day (BID) | ORAL | 0 refills | Status: DC

## 2016-07-17 MED ORDER — ALPRAZOLAM 0.25 MG PO TABS: 0.2500 mg | ORAL_TABLET | Freq: Every evening | ORAL | 2 refills | Status: DC | PRN

## 2016-07-17 NOTE — Telephone Encounter (Signed)
Called and let patient know that prescriptions are ready to be picked up.

## 2016-07-17 NOTE — Telephone Encounter (Signed)
Routing to provider  

## 2016-07-17 NOTE — Telephone Encounter (Signed)
Patient needs new script for her xanax and also her 28 day script.  Thanks

## 2016-08-04 ENCOUNTER — Other Ambulatory Visit: Payer: Self-pay | Admitting: Unknown Physician Specialty

## 2016-08-04 DIAGNOSIS — Z5181 Encounter for therapeutic drug level monitoring: Secondary | ICD-10-CM | POA: Insufficient documentation

## 2016-08-04 MED ORDER — HYDROCODONE-ACETAMINOPHEN 5-325 MG PO TABS: 1.0000 | ORAL_TABLET | Freq: Two times a day (BID) | ORAL | 0 refills | Status: DC

## 2016-08-17 ENCOUNTER — Other Ambulatory Visit: Payer: Medicare Other

## 2016-08-17 DIAGNOSIS — Z5181 Encounter for therapeutic drug level monitoring: Secondary | ICD-10-CM

## 2016-09-02 ENCOUNTER — Other Ambulatory Visit: Payer: Self-pay | Admitting: Unknown Physician Specialty

## 2016-09-02 MED ORDER — HYDROCODONE-ACETAMINOPHEN 5-325 MG PO TABS: 1.0000 | ORAL_TABLET | Freq: Two times a day (BID) | ORAL | 0 refills | Status: DC

## 2016-09-02 NOTE — Progress Notes (Unsigned)
Pt needs office visit

## 2016-09-03 DIAGNOSIS — L4052 Psoriatic arthritis mutilans: Secondary | ICD-10-CM | POA: Diagnosis not present

## 2016-09-03 DIAGNOSIS — L400 Psoriasis vulgaris: Secondary | ICD-10-CM | POA: Diagnosis not present

## 2016-09-04 ENCOUNTER — Telehealth: Payer: Self-pay | Admitting: Unknown Physician Specialty

## 2016-09-04 NOTE — Telephone Encounter (Signed)
Tried to reach patient to schedule f/u appt per [facility].  She was out of town.  Patients husband said she should be back by late this afternoon.

## 2016-09-04 NOTE — Progress Notes (Signed)
Patient is overdue for a regular f/up visit with Tamara Fleming. Please call and schedule f/up with Tamara Fleming.

## 2016-09-07 NOTE — Telephone Encounter (Signed)
Patient will call back to schedule the follow up appt with [facility].

## 2016-09-10 DIAGNOSIS — L409 Psoriasis, unspecified: Secondary | ICD-10-CM | POA: Diagnosis not present

## 2016-09-10 DIAGNOSIS — M533 Sacrococcygeal disorders, not elsewhere classified: Secondary | ICD-10-CM | POA: Insufficient documentation

## 2016-09-10 DIAGNOSIS — M1812 Unilateral primary osteoarthritis of first carpometacarpal joint, left hand: Secondary | ICD-10-CM | POA: Diagnosis not present

## 2016-09-10 DIAGNOSIS — M19042 Primary osteoarthritis, left hand: Secondary | ICD-10-CM | POA: Diagnosis not present

## 2016-09-10 HISTORY — DX: Unilateral primary osteoarthritis of first carpometacarpal joint, left hand: M18.12

## 2016-09-10 NOTE — Telephone Encounter (Signed)
Called patient she will call back to schedule.  Just FYI

## 2016-09-15 ENCOUNTER — Ambulatory Visit
Admission: RE | Admit: 2016-09-15 | Discharge: 2016-09-15 | Disposition: A | Discharge status: Home / Self Care | Payer: Medicare Other | Source: Ambulatory Visit | Attending: Unknown Physician Specialty | Admitting: Unknown Physician Specialty

## 2016-09-15 ENCOUNTER — Telehealth: Payer: Self-pay | Admitting: Unknown Physician Specialty

## 2016-09-15 ENCOUNTER — Other Ambulatory Visit: Payer: Self-pay | Admitting: Unknown Physician Specialty

## 2016-09-15 ENCOUNTER — Ambulatory Visit: Payer: Medicare Other

## 2016-09-15 ENCOUNTER — Ambulatory Visit (INDEPENDENT_AMBULATORY_CARE_PROVIDER_SITE_OTHER): Payer: Medicare Other | Admitting: Unknown Physician Specialty

## 2016-09-15 ENCOUNTER — Other Ambulatory Visit: Payer: Self-pay

## 2016-09-15 ENCOUNTER — Encounter: Payer: Self-pay | Admitting: Unknown Physician Specialty

## 2016-09-15 VITALS — BP 125/81 | HR 74 | Temp 98.1°F | Wt 177.6 lb

## 2016-09-15 DIAGNOSIS — R109 Unspecified abdominal pain: Secondary | ICD-10-CM | POA: Diagnosis not present

## 2016-09-15 DIAGNOSIS — K449 Diaphragmatic hernia without obstruction or gangrene: Secondary | ICD-10-CM | POA: Insufficient documentation

## 2016-09-15 DIAGNOSIS — I700 Atherosclerosis of aorta: Secondary | ICD-10-CM | POA: Diagnosis not present

## 2016-09-15 DIAGNOSIS — N83201 Unspecified ovarian cyst, right side: Secondary | ICD-10-CM | POA: Insufficient documentation

## 2016-09-15 DIAGNOSIS — G8929 Other chronic pain: Secondary | ICD-10-CM

## 2016-09-15 DIAGNOSIS — K5730 Diverticulosis of large intestine without perforation or abscess without bleeding: Secondary | ICD-10-CM | POA: Insufficient documentation

## 2016-09-15 DIAGNOSIS — R1030 Lower abdominal pain, unspecified: Secondary | ICD-10-CM | POA: Diagnosis not present

## 2016-09-15 DIAGNOSIS — M25552 Pain in left hip: Secondary | ICD-10-CM

## 2016-09-15 DIAGNOSIS — N83202 Unspecified ovarian cyst, left side: Secondary | ICD-10-CM | POA: Diagnosis not present

## 2016-09-15 DIAGNOSIS — M25551 Pain in right hip: Secondary | ICD-10-CM

## 2016-09-15 DIAGNOSIS — J9811 Atelectasis: Secondary | ICD-10-CM | POA: Insufficient documentation

## 2016-09-15 DIAGNOSIS — K439 Ventral hernia without obstruction or gangrene: Secondary | ICD-10-CM | POA: Insufficient documentation

## 2016-09-15 LAB — CBC WITH DIFFERENTIAL/PLATELET
HEMATOCRIT: 37.8 % (ref 34.0–46.6)
Hemoglobin: 12.9 g/dL (ref 11.1–15.9)
Lymphocytes Absolute: 2 10*3/uL (ref 0.7–3.1)
Lymphs: 38 %
MCH: 34.4 pg — AB (ref 26.6–33.0)
MCHC: 34.1 g/dL (ref 31.5–35.7)
MCV: 101 fL — AB (ref 79–97)
MID (Absolute): 0.7 10*3/uL (ref 0.1–1.6)
MID: 14 %
Neutrophils Absolute: 2.6 10*3/uL (ref 1.4–7.0)
Neutrophils: 48 %
Platelets: 174 10*3/uL (ref 150–379)
RBC: 3.75 x10E6/uL — ABNORMAL LOW (ref 3.77–5.28)
RDW: 13 % (ref 12.3–15.4)
WBC: 5.3 10*3/uL (ref 3.4–10.8)

## 2016-09-15 LAB — POCT I-STAT CREATININE: CREATININE: 1.1 mg/dL — AB (ref 0.44–1.00)

## 2016-09-15 MED ORDER — AMOXICILLIN-POT CLAVULANATE 250-62.5 MG/5ML PO SUSR: 500.0000 mg | Freq: Two times a day (BID) | ORAL | 0 refills | Status: DC

## 2016-09-15 MED ORDER — HYDROCODONE-ACETAMINOPHEN 5-325 MG PO TABS: 1.0000 | ORAL_TABLET | Freq: Two times a day (BID) | ORAL | 0 refills | Status: DC

## 2016-09-15 MED ORDER — IOPAMIDOL (ISOVUE-300) INJECTION 61%
100.0000 mL | Freq: Once | INTRAVENOUS | Status: AC | PRN
  Administered 2016-09-15: 100 mL via INTRAVENOUS

## 2016-09-15 MED ORDER — AMOXICILLIN-POT CLAVULANATE 875-125 MG PO TABS: 1.0000 | ORAL_TABLET | Freq: Two times a day (BID) | ORAL | 0 refills | Status: DC

## 2016-09-15 NOTE — Progress Notes (Signed)
BP 125/81   Pulse 74   Temp 98.1 F (36.7 C)   Wt 177 lb 9.6 oz (80.6 kg)   LMP  (LMP Unknown)   SpO2 98%   BMI 29.92 kg/m    Subjective:    Patient ID: Tamara Fleming, female    DOB: 24-Nov-1945, 71 y.o.   MRN: [id number]  HPI: Tamara Fleming is a 71 y.o. female  Chief Complaint  Patient presents with  . Abdominal Pain    pt states she has been having lower abdominal pain for about 2 weeks, states the pain has just gotten worse   Abdominal Pain  This is a new problem. Episode onset: 2 weeks. The problem occurs constantly. Progression since onset: getting gradually worse.   The pain is located in the RLQ. The quality of the pain is cramping. The abdominal pain radiates to the back. Pertinent negatives include no constipation, diarrhea, dysuria, vomiting or weight loss. Nothing aggravates the pain. Relieved by: position changes. She has tried oral narcotic analgesics for the symptoms. The treatment provided moderate relief. diverticulitis but this does not feel like those episode.     Relevant past medical, surgical, family and social history reviewed and updated as indicated. Interim medical history since our last visit reviewed. Allergies and medications reviewed and updated.  Review of Systems  Constitutional: Negative for weight loss.  Gastrointestinal: Positive for abdominal pain. Negative for constipation, diarrhea and vomiting.  Genitourinary: Negative for dysuria.    Per HPI unless specifically indicated above     Objective:    BP 125/81   Pulse 74   Temp 98.1 F (36.7 C)   Wt 177 lb 9.6 oz (80.6 kg)   LMP  (LMP Unknown)   SpO2 98%   BMI 29.92 kg/m   Wt Readings from Last 3 Encounters:  09/15/16 177 lb 9.6 oz (80.6 kg)  04/28/16 176 lb 6.4 oz (80 kg)  09/10/15 175 lb (79.4 kg)    Physical Exam  Constitutional: She is oriented to person, place, and time. She appears well-developed and well-nourished. No distress.  HENT:  Head: Normocephalic and  atraumatic.  Eyes: Conjunctivae and lids are normal. Right eye exhibits no discharge. Left eye exhibits no discharge. No scleral icterus.  Neck: Normal range of motion. Neck supple. No JVD present. Carotid bruit is not present.  Cardiovascular: Normal rate, regular rhythm and normal heart sounds.   Pulmonary/Chest: Effort normal and breath sounds normal.  Abdominal: Soft. Normal appearance. There is no hepatosplenomegaly, splenomegaly or hepatomegaly. There is tenderness in the right lower quadrant. There is no rigidity, no rebound, no guarding and no CVA tenderness.  Musculoskeletal: Normal range of motion.  Neurological: She is alert and oriented to person, place, and time.  Skin: Skin is warm, dry and intact. No rash noted. No pallor.  Psychiatric: She has a normal mood and affect. Her behavior is normal. Judgment and thought content normal.  CBC and urine are normal    Assessment & Plan:   Problem List Items Addressed This Visit      Unprioritized   Chronic left hip pain    Refer to pain management.  She is getting this checked out by rheumatology.  However, need pain medicine management for ongoing management of narcotic medications      Relevant Orders   Ambulatory referral to Pain Clinic    Other Visit Diagnoses    Lower abdominal pain    -  Primary   CBC normal.  Urine 1+ leukocytes.  Treat for UTI.  ? bowel or ovarian pathology.  Order CT scan.  Refill Hydrocodone but need to refer to pain managment.     Relevant Orders   UA/M w/rflx Culture, Routine   CBC With Differential/Platelet   CT PELVIS W WO CONTRAST   CT ABDOMEN W WO CONTRAST      To ER if worsening before CT scan  Follow up plan: Return To ER if symptoms worsen or fail to improve.

## 2016-09-15 NOTE — Assessment & Plan Note (Addendum)
Refer to pain management.  She is getting this checked out by rheumatology.  However, need pain medicine management for ongoing management of narcotic medications

## 2016-09-15 NOTE — Progress Notes (Signed)
Patient notified of results by phone.

## 2016-09-15 NOTE — Telephone Encounter (Signed)
Routing to provider. Was patient supposed to be given Augmentin at visit yesterday?

## 2016-09-15 NOTE — Addendum Note (Signed)
Addended by: Sandria Manly on: 09/15/2016 10:32 AM   Modules accepted: Orders

## 2016-09-15 NOTE — Telephone Encounter (Signed)
Patient states that this was supposed to be liquid and not tablets.

## 2016-09-15 NOTE — Telephone Encounter (Signed)
Pharmacy called in regards to patient at the pharmacy asking about a prescription for Augmentin. Patient states the prescription was an antibiotic.   Please Advise.  Thank you

## 2016-09-15 NOTE — Telephone Encounter (Addendum)
Malachy Mood, did you see the change to the imaging report?   ADDENDUM REPORT: 09/15/2016 14:09  ADDENDUM: There is aortic atherosclerosis.  Aortic Atherosclerosis (ICD10-I70.0).

## 2016-09-15 NOTE — Telephone Encounter (Signed)
Discussed with pt CT scan.  ? Cystitis, muscle attenuation.  Given Augmentin at visit.  Refer to Orthopedics.  ? Related to hip pain

## 2016-09-17 LAB — MICROSCOPIC EXAMINATION
BACTERIA UA: NONE SEEN
RBC, UA: NONE SEEN /hpf (ref 0–?)

## 2016-09-17 LAB — UA/M W/RFLX CULTURE, ROUTINE
BILIRUBIN UA: NEGATIVE
GLUCOSE, UA: NEGATIVE
Ketones, UA: NEGATIVE
NITRITE UA: NEGATIVE
Protein, UA: NEGATIVE
SPEC GRAV UA: 1.015 (ref 1.005–1.030)
Urobilinogen, Ur: 0.2 mg/dL (ref 0.2–1.0)
pH, UA: 5.5 (ref 5.0–7.5)

## 2016-09-17 LAB — URINE CULTURE, REFLEX

## 2016-09-29 DIAGNOSIS — R600 Localized edema: Secondary | ICD-10-CM | POA: Insufficient documentation

## 2016-09-29 DIAGNOSIS — E7800 Pure hypercholesterolemia, unspecified: Secondary | ICD-10-CM | POA: Diagnosis not present

## 2016-09-29 DIAGNOSIS — I10 Essential (primary) hypertension: Secondary | ICD-10-CM | POA: Diagnosis not present

## 2016-09-29 DIAGNOSIS — R002 Palpitations: Secondary | ICD-10-CM | POA: Diagnosis not present

## 2016-09-30 ENCOUNTER — Other Ambulatory Visit: Payer: Self-pay | Admitting: Unknown Physician Specialty

## 2016-09-30 ENCOUNTER — Ambulatory Visit
Admission: RE | Admit: 2016-09-30 | Discharge: 2016-09-30 | Disposition: A | Discharge status: Home / Self Care | Payer: Medicare Other | Source: Ambulatory Visit | Attending: Nurse Practitioner | Admitting: Nurse Practitioner

## 2016-09-30 ENCOUNTER — Ambulatory Visit: Payer: Medicare Other | Attending: Nurse Practitioner | Admitting: Nurse Practitioner

## 2016-09-30 ENCOUNTER — Other Ambulatory Visit: Payer: Self-pay | Admitting: Nurse Practitioner

## 2016-09-30 ENCOUNTER — Encounter: Payer: Self-pay | Admitting: Nurse Practitioner

## 2016-09-30 VITALS — BP 150/67 | HR 77 | Temp 98.0°F | Resp 16 | Ht 65.0 in | Wt 180.0 lb

## 2016-09-30 DIAGNOSIS — M25551 Pain in right hip: Secondary | ICD-10-CM

## 2016-09-30 DIAGNOSIS — E7800 Pure hypercholesterolemia, unspecified: Secondary | ICD-10-CM | POA: Diagnosis not present

## 2016-09-30 DIAGNOSIS — M545 Low back pain, unspecified: Secondary | ICD-10-CM

## 2016-09-30 DIAGNOSIS — K219 Gastro-esophageal reflux disease without esophagitis: Secondary | ICD-10-CM | POA: Diagnosis not present

## 2016-09-30 DIAGNOSIS — E079 Disorder of thyroid, unspecified: Secondary | ICD-10-CM | POA: Diagnosis not present

## 2016-09-30 DIAGNOSIS — G5603 Carpal tunnel syndrome, bilateral upper limbs: Secondary | ICD-10-CM | POA: Diagnosis not present

## 2016-09-30 DIAGNOSIS — M109 Gout, unspecified: Secondary | ICD-10-CM | POA: Insufficient documentation

## 2016-09-30 DIAGNOSIS — Z809 Family history of malignant neoplasm, unspecified: Secondary | ICD-10-CM | POA: Insufficient documentation

## 2016-09-30 DIAGNOSIS — N189 Chronic kidney disease, unspecified: Secondary | ICD-10-CM | POA: Insufficient documentation

## 2016-09-30 DIAGNOSIS — Z9889 Other specified postprocedural states: Secondary | ICD-10-CM | POA: Diagnosis not present

## 2016-09-30 DIAGNOSIS — Z882 Allergy status to sulfonamides status: Secondary | ICD-10-CM | POA: Diagnosis not present

## 2016-09-30 DIAGNOSIS — G8929 Other chronic pain: Secondary | ICD-10-CM | POA: Insufficient documentation

## 2016-09-30 DIAGNOSIS — F1190 Opioid use, unspecified, uncomplicated: Secondary | ICD-10-CM | POA: Insufficient documentation

## 2016-09-30 DIAGNOSIS — M25552 Pain in left hip: Secondary | ICD-10-CM | POA: Insufficient documentation

## 2016-09-30 DIAGNOSIS — R011 Cardiac murmur, unspecified: Secondary | ICD-10-CM | POA: Insufficient documentation

## 2016-09-30 DIAGNOSIS — Z90711 Acquired absence of uterus with remaining cervical stump: Secondary | ICD-10-CM | POA: Diagnosis not present

## 2016-09-30 DIAGNOSIS — Z7982 Long term (current) use of aspirin: Secondary | ICD-10-CM | POA: Insufficient documentation

## 2016-09-30 DIAGNOSIS — M1612 Unilateral primary osteoarthritis, left hip: Secondary | ICD-10-CM | POA: Insufficient documentation

## 2016-09-30 DIAGNOSIS — M546 Pain in thoracic spine: Secondary | ICD-10-CM | POA: Insufficient documentation

## 2016-09-30 DIAGNOSIS — G894 Chronic pain syndrome: Secondary | ICD-10-CM | POA: Insufficient documentation

## 2016-09-30 DIAGNOSIS — Z79891 Long term (current) use of opiate analgesic: Secondary | ICD-10-CM | POA: Diagnosis not present

## 2016-09-30 DIAGNOSIS — Z881 Allergy status to other antibiotic agents status: Secondary | ICD-10-CM | POA: Diagnosis not present

## 2016-09-30 DIAGNOSIS — I129 Hypertensive chronic kidney disease with stage 1 through stage 4 chronic kidney disease, or unspecified chronic kidney disease: Secondary | ICD-10-CM | POA: Insufficient documentation

## 2016-09-30 DIAGNOSIS — Z87442 Personal history of urinary calculi: Secondary | ICD-10-CM | POA: Diagnosis not present

## 2016-09-30 DIAGNOSIS — Z8249 Family history of ischemic heart disease and other diseases of the circulatory system: Secondary | ICD-10-CM | POA: Diagnosis not present

## 2016-09-30 HISTORY — DX: Low back pain, unspecified: M54.50

## 2016-09-30 MED ORDER — HYDROCODONE-ACETAMINOPHEN 5-325 MG PO TABS: 1.0000 | ORAL_TABLET | Freq: Two times a day (BID) | ORAL | 0 refills | Status: DC

## 2016-09-30 NOTE — Progress Notes (Signed)
Patient's Name: Tamara Fleming  MRN: [id number]  Referring Provider: Kathrine Haddock, NP  DOB: Oct 29, 1945  PCP: Kathrine Haddock, NP  DOS: 09/30/2016  Note by: Dionisio David NP  Service setting: Ambulatory outpatient  Specialty: Interventional Pain Management  Location: [facility] (AMB) Pain Management Facility    Patient type: New Patient    Primary Reason(s) for Visit: Initial Patient Evaluation CC: Hip Pain (bilaterally - primarily left) and Groin Pain (bilaterally)  HPI  Tamara Fleming is a 71 y.o. year old, female patient, who comes today for an initial evaluation. She has Chest pain with high risk for cardiac etiology; Dyslipidemia; Osteoarthrosis; Fatigue; Palpitations; Carpal tunnel syndrome; Essential hypertension; Familial multiple lipoprotein-type hyperlipidemia; Hip pain; IFG (impaired fasting glucose); Medication monitoring encounter; Malaise; Pedal edema; Primary osteoarthritis of first carpometacarpal joint of left hand; Psoriasis, unspecified; Pure hypercholesterolemia; Sacral pain; Long term current use of opiate analgesic; Chronic pain syndrome; Opiate use; and Long term prescription opiate use on her problem list.. Her primarily concern today is the Hip Pain (bilaterally - primarily left) and Groin Pain (bilaterally)  Pain Assessment: Location: Right, Left (primarily right from birth) Hip Radiating: buttocks around to groin Onset: More than a month ago Duration: Chronic pain Quality: Aching, Constant, Discomfort, Cramping, Nagging, Sharp Severity: 2 /10 (self-reported pain score)  Note: Reported level is compatible with observation.                   Effect on ADL: pace self Timing: Intermittent Modifying factors: pain medications-   Onset and Duration: Gradual Cause of pain: States from birth - hip not in socket Severity: Getting worse, NAS-11 at its worse: 10/10, NAS-11 at its best: 4/10, NAS-11 now: 2/10 and NAS-11 on the average: 8/10 Timing: Morning, Night, During activity or  exercise and After activity or exercise Aggravating Factors: Bending, Kneeling, Lifiting, Prolonged standing, Squatting and Walking uphill Alleviating Factors: Medications Associated Problems: Sweating, Pain that wakes patient up and Pain that does not allow patient to sleep Quality of Pain: Aching, Sharp, Shooting, Stabbing and Tingling Previous Examinations or Tests: CT scan, MRI scan, X-rays and Nerve conduction test Previous Treatments: Narcotic medications and Stretching exercises  The patient comes into the clinics today for the first time for a chronic pain management evaluation. According to patient her primary pain is in her hips. She feels this is secondary to congenital abnormality for left hip. She admits that she also had a fall in 28 which may have made the pain worse. She admits the left is greater than the right. She admits that on the left she has weakness. She admits that radiates into her groin area. She denies any previous surgeries. She has had interventional treatment with steroids by Dr. Percell Miller in [place]. She admits that this was effective for a while. He denies any physical therapy. She has had a recent CT of her pelvis. She admits that she's having pain in her buttocks but got worse over the last few months.  Her next area of pain is in her back. She denies any radicular symptoms other than above. She has not had any interventional therapy, physical therapy MRI done in 2017  She admits that she has upper back in the morning but it resolves as the day processes. She describes it and pins and needles. She admits that she has arthritis and T6 and T7. She denies any previous interventions or physical therapy.  Today I took the time to provide the patient with information regarding this pain practice.  The patient was informed that the practice is divided into two sections: an interventional pain management section, as well as a completely separate and distinct medication  management section. I explained that there are procedure days for interventional therapies, and evaluation days for follow-ups and medication management. Because of the amount of documentation required during both, they are kept separated. This means that there is the possibility that she may be scheduled for a procedure on one day, and medication management the next. I have also informed her that because of staffing and facility limitations, this practice will no longer take patients for medication management only. To illustrate the reasons for this, I gave the patient the example of surgeons, and how inappropriate it would be to refer a patient to her care, just to write for the post-surgical antibiotics on a surgery done by a different surgeon.   Because interventional pain management is part of the board-certified specialty for the doctors, the patient was informed that joining this practice means that they are open to any and all interventional therapies. I made it clear that this does not mean that they will be forced to have any procedures done. What this means is that I believe interventional therapies to be essential part of the diagnosis and proper management of chronic pain conditions. Therefore, patients not interested in these interventional alternatives will be better served under the care of a different practitioner.  The patient was also made aware of my Comprehensive Pain Management Safety Guidelines where by joining this practice, they limit all of their nerve blocks and joint injections to those done by our practice, for as long as we are retained to manage their care. Historic Controlled Substance Pharmacotherapy Review  PMP and historical list of controlled substances:Hydrocodone/acetaminophen 5/325 mg, alprazolam 0.25 mg, diazepam 5 mg, acetaminophen/codeine 120/12 mg, hydrocodone/acetaminophen 5/500 mg Highest opioid analgesic regimen found: Hydrocodone/acetaminophen 5/325 mg 6 times  daily (fill date 07/15/2012) (hydrocodone 30 mg per day) Most recent opioid analgesic: Hydrocodone/acetaminophen 5/325 mg twice daily Current opioid analgesics:Hydrocodone/acetaminophen 5/325 mg twice daily (fill date 09/14/2016) Highest recorded MME/day: 30 mg/day MME/day: 10 mg/day Medications: The patient did not bring the medication(s) to the appointment, as requested in our "New Patient Package" Pharmacodynamics: Desired effects: Analgesia: The patient reports >50% benefit. Reported improvement in function: The patient reports medication allows her to accomplish basic ADLs. Clinically meaningful improvement in function (CMIF): Sustained CMIF goals met Perceived effectiveness: Described as relatively effective, allowing for increase in activities of daily living (ADL) Undesirable effects: Side-effects or Adverse reactions: None reported Historical Monitoring: The patient  reports that she does not use drugs. List of all UDS Test(s): Lab Results  Component Value Date   COCAINSCRNUR Negative 03/26/2015   PCPQUANT Negative 03/26/2015   CANNABQUANT Negative 03/26/2015   List of all Serum Drug Screening Test(s):  Lab Results  Component Value Date   PCPQUANT Negative 03/26/2015   CANNABQUANT Negative 03/26/2015   Historical Background Evaluation: [place] PDMP: Six (6) year initial data search conducted.             [place] Department of public safety, offender search: Editor, commissioning Information) Non-contributory Risk Assessment Profile: Aberrant behavior: None observed or detected today Risk factors for fatal opioid overdose: Caucasian Fatal overdose hazard ratio (HR): 1.32 for 20-49 MME/day Non-fatal overdose hazard ratio (HR): 1.44 for 20-49 MME/day Risk of opioid abuse or dependence: 0.7-3.0% with doses ? 36 MME/day and 6.1-26% with doses ? 120 MME/day. Substance use disorder (SUD) risk level: Pending results of  Medical Psychology Evaluation for SUD Opioid risk tool (ORT) (Total Score):  0  ORT Scoring interpretation table:  Score <3 = Low Risk for SUD  Score between 4-7 = Moderate Risk for SUD  Score >8 = High Risk for Opioid Abuse   PHQ-2 Depression Scale:  Total score: 0  PHQ-2 Scoring interpretation table: (Score and probability of major depressive disorder)  Score 0 = No depression  Score 1 = 15.4% Probability  Score 2 = 21.1% Probability  Score 3 = 38.4% Probability  Score 4 = 45.5% Probability  Score 5 = 56.4% Probability  Score 6 = 78.6% Probability   PHQ-9 Depression Scale:  Total score: 0  PHQ-9 Scoring interpretation table:  Score 0-4 = No depression  Score 5-9 = Mild depression  Score 10-14 = Moderate depression  Score 15-19 = Moderately severe depression  Score 20-27 = Severe depression (2.4 times higher risk of SUD and 2.89 times higher risk of overuse)   Pharmacologic Plan: Pending ordered tests and/or consults  Meds  The patient has a current medication list which includes the following prescription(s): alprazolam, aspirin, atorvastatin, furosemide, losartan-hydrochlorothiazide, amoxicillin-clavulanate, amoxicillin-clavulanate, furosemide, and hydrocodone-acetaminophen.  Current Outpatient Prescriptions on File Prior to Visit  Medication Sig  . ALPRAZolam (XANAX) 0.25 MG tablet Take 1 tablet (0.25 mg total) by mouth at bedtime as needed.  . aspirin 81 MG tablet Take 81 mg by mouth daily.   . atorvastatin (LIPITOR) 10 MG tablet TAKE 1 TABLET BY MOUTH AT BEDTIME  . losartan-hydrochlorothiazide (HYZAAR) 100-12.5 MG tablet TAKE 1 TABLET BY MOUTH ONCE DAILY  . amoxicillin-clavulanate (AUGMENTIN) 250-62.5 MG/5ML suspension Take 10 mLs (500 mg total) by mouth 2 (two) times daily. (Patient not taking: Reported on 09/30/2016)   No current facility-administered medications on file prior to visit.    Imaging Review  Cervical Imaging:  Cervical MR wo contrast:  Results for orders placed in visit on 01/11/07  MR C Spine Ltd W/O Cm   Narrative * PRIOR  REPORT IMPORTED FROM AN EXTERNAL SYSTEM    PRIOR REPORT IMPORTED FROM THE [facility] WORKFLOW SYSTEM   REASON FOR EXAM:    Neck Pain  COMMENTS:   PROCEDURE:     MR  - MR CERVICAL SPINE WO CONT  - Jan 11 2007  8:02PM   RESULT:     Nongadolinium routine fat and fluid sensitive multiplanar MR  imaging of the cervical spine is performed. The patient has no prior  examination for comparison. The study demonstrates intervertebral disc  space  narrowing at the C4-C5, C5-C6 and C6-C7 levels with disc bulge and  hypertrophic endplate bony spurring most pronounced at C5-C6. There is a  suggestion of slight retrolisthesis of C5 relative to C6.   At C5-C6 level, there is spinal canal stenosis secondary to the changes.  There is no significant CSF signal at this level. There does not appear to  be severe foraminal stenosis at this level.   At C4-C5, there is slight narrowing of the spinal canal with a small  amount  of CSF remaining around the spinal cord but no significant cord deformity  is  evident.   At C6-C7, there is no significant spinal stenosis or foraminal stenosis.  At  other levels, the spinal canal is patent and the foramina appear  unremarkable.   IMPRESSION:   Multilevel degenerative changes as described with spinal stenosis at  C5-C6.  There is some mild foraminal narrowing but no definite severe foraminal  stenosis is evident.   Surgical consultation is suggested. There is no  abnormal cord signal.   Thank you for the opportunity to contribute to the care of your patient.       Lumbosacral Imaging: Lumbar MR wo contrast:  Results for orders placed during the hospital encounter of 11/09/15  MR Lumbar Spine Wo Contrast   Narrative CLINICAL DATA:  Chronic low back, LEFT hip and leg pain. Status post steroid injection.  EXAM: MRI LUMBAR SPINE WITHOUT CONTRAST  TECHNIQUE: Multiplanar, multisequence MR imaging of the lumbar spine was performed. No intravenous contrast  was administered.  COMPARISON:  CT abdomen and pelvis January 31, 2015  FINDINGS: SEGMENTATION: For the purposes of this report, the last well-formed intervertebral disc will be described as L5-S1.  ALIGNMENT: No malalignment.  Maintenance of the lumbar lordosis.  VERTEBRAE:Lumbar vertebral bodies are intact. Moderate to severe L5-S1 disc height loss, moderate at L3-4, mild at L2-3 and L4-5. Decreased T2 signal within the lower lumbar disc compatible with mild desiccation. Moderate subacute on chronic discogenic endplate change at X3-A3, mild at L3-4. Mild acute on chronic discogenic endplate changes F5-7, predominately toward the RIGHT. Moderate acute discogenic endplate changes D22-02. No STIR signal abnormality to suggest fracture.  CONUS MEDULLARIS: Conus medullaris terminates at L1-2 and demonstrates normal morphology and signal characteristics. Cauda equina is normal.  PARASPINAL AND SOFT TISSUES: Included prevertebral and paraspinal soft tissues are normal.  DISC LEVELS:  L1-2: No disc bulge, canal stenosis nor neural foraminal narrowing.  L2-3: Small broad-based disc bulge with superimposed moderate LEFT subarticular extra foraminal disc protrusion encroaching upon the traversing LEFT L3 nerve. Mild facet arthropathy and ligamentum flavum redundancy without canal stenosis. Mild LEFT neural foraminal narrowing.  L3-4: 3 mm broad-based disc bulge. Moderate RIGHT mild LEFT facet arthropathy and ligamentum flavum redundancy. Very mild canal stenosis. Minimal RIGHT neural foraminal narrowing.  L4-5: 3 mm broad-based disc bulge. Mild facet arthropathy and ligamentum flavum redundancy without canal stenosis. Minimal bilateral caudal neural foraminal narrowing.  L5-S1: 3 mm broad-based disc bulge. Moderate RIGHT, mild LEFT facet arthropathy. No canal stenosis. Mild RIGHT, minimal LEFT neural foraminal narrowing.  IMPRESSION: Degenerative lumbar spine resulting in very  mild canal stenosis L3-4.  Minimal to mild L2-3 through L5-S1 neural foraminal narrowing.   Electronically Signed   By: Elon Alas M.D.   On: 11/10/2015 03:25      Note: Available results from prior imaging studies were reviewed.        ROS  Cardiovascular History: Daily Aspirin intake, High blood pressure and Heart murmur Pulmonary or Respiratory History: Snoring  Neurological History: No reported neurological signs or symptoms such as seizures, abnormal skin sensations, urinary and/or fecal incontinence, being born with an abnormal open spine and/or a tethered spinal cord Review of Past Neurological Studies: No results found for this or any previous visit. Psychological-Psychiatric History: No reported psychological or psychiatric signs or symptoms such as difficulty sleeping, anxiety, depression, delusions or hallucinations (schizophrenial), mood swings (bipolar disorders) or suicidal ideations or attempts Gastrointestinal History: Heartburn due to stomach pushing into lungs (Hiatal hernia) and Reflux or heatburn Genitourinary History: No reported renal or genitourinary signs or symptoms such as difficulty voiding or producing urine, peeing blood, non-functioning kidney, kidney stones, difficulty emptying the bladder, difficulty controlling the flow of urine, or chronic kidney disease Hematological History: No reported hematological signs or symptoms such as prolonged bleeding, low or poor functioning platelets, bruising or bleeding easily, hereditary bleeding problems, low energy levels due to low hemoglobin or being anemic  Endocrine History: No reported endocrine signs or symptoms such as high or low blood sugar, rapid heart rate due to high thyroid levels, obesity or weight gain due to slow thyroid or thyroid disease Rheumatologic History: No reported rheumatological signs and symptoms such as fatigue, joint pain, tenderness, swelling, redness, heat, stiffness, decreased range  of motion, with or without associated rash Musculoskeletal History: Negative for myasthenia gravis, muscular dystrophy, multiple sclerosis or malignant hyperthermia Work History: Working part time  Allergies  Tamara Fleming is allergic to ciprofloxacin; floxin [ofloxacin]; and sulfa antibiotics.  Laboratory Chemistry  Inflammation Markers No results found for: CRP, ESRSEDRATE (CRP: Acute Phase) (ESR: Chronic Phase) Renal Function Markers Lab Results  Component Value Date   BUN 17 09/10/2015   CREATININE 1.10 (H) 09/15/2016   GFRAA 68 09/10/2015   GFRNONAA 59 (L) 09/10/2015   Hepatic Function Markers Lab Results  Component Value Date   AST 21 09/10/2015   ALT 18 09/10/2015   ALBUMIN 4.4 09/10/2015   ALKPHOS 78 09/10/2015   Electrolytes Lab Results  Component Value Date   NA 142 09/10/2015   K 5.0 09/10/2015   CL 101 09/10/2015   CALCIUM 9.9 09/10/2015   Neuropathy Markers No results found for: VITAMINB12 Bone Pathology Markers Lab Results  Component Value Date   ALKPHOS 78 09/10/2015   CALCIUM 9.9 09/10/2015   Coagulation Parameters Lab Results  Component Value Date   PLT 174 09/15/2016   Cardiovascular Markers Lab Results  Component Value Date   BNP 30 07/14/2012   HGB 12.9 09/15/2016   HCT 37.8 09/15/2016   Note: Lab results reviewed.  PFSH  Drug: Tamara Fleming  reports that she does not use drugs. Alcohol:  reports that she drinks alcohol. Tobacco:  reports that she has never smoked. She has never used smokeless tobacco. Medical:  has a past medical history of Carpal tunnel syndrome (05/09/2014); Chest pain; Chronic low back pain; Fatigue; Gout; Heart murmur; Hiatal hernia; Hip pain; Hyperlipidemia; Hypertension; and OA (osteoarthritis). Family: family history includes Cancer in her father; Coronary artery disease in her mother.  Past Surgical History:  Procedure Laterality Date  . ABDOMINAL HYSTERECTOMY     Partial  . ARM SURGERY     OF THE ULNA NERVE   . BREAST REDUCTION SURGERY  06/11/15  . BREAST TUMOR REMOVAL    . CARDIOVASCULAR STRESS TEST  08/11/2006   NORMAL. EF 55-60%  . CARDIOVASCULAR STRESS TEST  10/31/1998   EF 60% AT REST TO 75-80% POST EXERCISE  . ESOPHAGOGASTRODUODENOSCOPY  05/16/1990  . ESOPHAGOGASTRODUODENOSCOPY (EGD) WITH PROPOFOL N/A 02/04/2015   Procedure: ESOPHAGOGASTRODUODENOSCOPY (EGD) WITH PROPOFOL;  Surgeon: Paul Y Oh, MD;  Location: [facility] ENDOSCOPY;  Service: Gastroenterology;  Laterality: N/A;   Active Ambulatory Problems    Diagnosis Date Noted  . Chest pain with high risk for cardiac etiology 10/20/2010  . Dyslipidemia 10/20/2010  . Osteoarthrosis 10/20/2010  . Fatigue 10/20/2010  . Palpitations 09/10/2011  . Carpal tunnel syndrome 05/09/2014  . Essential hypertension 06/04/2015  . Familial multiple lipoprotein-type hyperlipidemia 06/04/2015  . Hip pain 06/04/2015  . IFG (impaired fasting glucose) 06/04/2015  . Medication monitoring encounter 08/04/2016  . Malaise 10/20/2010  . Pedal edema 09/29/2016  . Primary osteoarthritis of first carpometacarpal joint of left hand 09/10/2016  . Psoriasis, unspecified 09/10/2016  . Pure hypercholesterolemia 05/08/2015  . Sacral pain 09/10/2016  . Long term current use of opiate analgesic 09/30/2016  . Chronic pain syndrome 09/30/2016  . Opiate use 09/30/2016  . Long   term prescription opiate use 09/30/2016   Resolved Ambulatory Problems    Diagnosis Date Noted  . No Resolved Ambulatory Problems   Past Medical History:  Diagnosis Date  . Carpal tunnel syndrome 05/09/2014  . Chest pain   . Chronic low back pain   . Fatigue   . Gout   . Heart murmur   . Hiatal hernia   . Hip pain   . Hyperlipidemia   . Hypertension   . OA (osteoarthritis)    Constitutional Exam  General appearance: Well nourished, well developed, and well hydrated. In no apparent acute distress Vitals:   09/30/16 1010  BP: (!) 150/67  Pulse: 77  Resp: 16  Temp: 98 F (36.7 C)  SpO2:  100%  Weight: 180 lb (81.6 kg)  Height: 5' 5" (1.651 m)   BMI Assessment: Estimated body mass index is 29.95 kg/m as calculated from the following:   Height as of this encounter: 5' 5" (1.651 m).   Weight as of this encounter: 180 lb (81.6 kg).  BMI interpretation table: BMI level Category Range association with higher incidence of chronic pain  <18 kg/m2 Underweight   18.5-24.9 kg/m2 Ideal body weight   25-29.9 kg/m2 Overweight Increased incidence by 20%  30-34.9 kg/m2 Obese (Class I) Increased incidence by 68%  35-39.9 kg/m2 Severe obesity (Class II) Increased incidence by 136%  >40 kg/m2 Extreme obesity (Class III) Increased incidence by 254%   BMI Readings from Last 4 Encounters:  09/30/16 29.95 kg/m  09/15/16 29.92 kg/m  04/28/16 29.72 kg/m  09/10/15 29.48 kg/m   Wt Readings from Last 4 Encounters:  09/30/16 180 lb (81.6 kg)  09/15/16 177 lb 9.6 oz (80.6 kg)  04/28/16 176 lb 6.4 oz (80 kg)  09/10/15 175 lb (79.4 kg)  Psych/Mental status: Alert, oriented x 3 (person, place, & time)       Eyes: PERLA Respiratory: No evidence of acute respiratory distress  Cervical Spine Exam  Inspection: No masses, redness, or swelling Alignment: Symmetrical Functional ROM: Unrestricted ROM      Stability: No instability detected Muscle strength & Tone: Functionally intact Sensory: Unimpaired Palpation: No palpable anomalies              Upper Extremity (UE) Exam    Side: Right upper extremity  Side: Left upper extremity  Inspection: No masses, redness, swelling, or asymmetry. No contractures  Inspection: No masses, redness, swelling, or asymmetry. No contractures  Functional ROM: Unrestricted ROM          Functional ROM: Unrestricted ROM          Muscle strength & Tone: Functionally intact  Muscle strength & Tone: Functionally intact  Sensory: Unimpaired  Sensory: Unimpaired  Palpation: No palpable anomalies              Palpation: No palpable anomalies               Specialized Test(s): Deferred         Specialized Test(s): Deferred          Thoracic Spine Exam  Inspection: No masses, redness, or swelling Alignment: Symmetrical Functional ROM: Unrestricted ROM Stability: No instability detected Sensory: Unimpaired Muscle strength & Tone: No palpable anomalies  Lumbar Spine Exam  Inspection: No masses, redness, or swelling Alignment: Symmetrical Functional ROM: Unrestricted ROM      Stability: No instability detected Muscle strength & Tone: Functionally intact Sensory: Unimpaired Palpation: Complains of area being tender to palpation         Provocative Tests: Lumbar Hyperextension and rotation test: Negative       Patrick's Maneuver: Positive for right-sided S-I arthralgia unable to perform with left leg               Gait & Posture Assessment  Ambulation: Unassisted Gait: Relatively normal for age and body habitus Posture: WNL   Lower Extremity Exam    Side: Right lower extremity  Side: Left lower extremity  Inspection: No masses, redness, swelling, or asymmetry. No contractures  Inspection: No masses, redness, swelling, or asymmetry. No contractures  Functional ROM: Unrestricted ROM          Functional ROM: Unrestricted ROM          Muscle strength & Tone: Able to Toe-walk & Heel-walk without problems  Muscle strength & Tone: Able to Toe-walk & Heel-walk without problems  Sensory: Unimpaired  Sensory: Unimpaired  Palpation: No palpable anomalies  Palpation: No palpable anomalies   Assessment  Primary Diagnosis & Pertinent Problem List: The primary encounter diagnosis was Pain of both hip joints. Diagnoses of Chronic pain syndrome and Long term current use of opiate analgesic were also pertinent to this visit.  Visit Diagnosis: 1. Pain of both hip joints   2. Chronic pain syndrome   3. Long term current use of opiate analgesic    Plan of Care  Initial treatment plan:  Please be advised that as per protocol, today's visit has been  an evaluation only. We have not taken over the patient's controlled substance management.  Problem-specific plan: No problem-specific Assessment & Plan notes found for this encounter.  Ordered Lab-work, Procedure(s), Referral(s), & Consult(s): Orders Placed This Encounter  Procedures  . DG HIP UNILAT W OR W/O PELVIS 2-3 VIEWS LEFT  . DG HIP UNILAT W OR W/O PELVIS 2-3 VIEWS RIGHT  . Compliance Drug Analysis, Ur  . Comprehensive metabolic panel  . C-reactive protein  . Sedimentation rate  . Magnesium  . 25-Hydroxyvitamin D Lcms D2+D3  . Vitamin B12  . Ambulatory referral to Psychology   Pharmacotherapy: Medications ordered:  No orders of the defined types were placed in this encounter.  Medications administered during this visit: Tamara Fleming had no medications administered during this visit.   Pharmacotherapy under consideration:  Opioid Analgesics: The patient was informed that there is no guarantee that she would be a candidate for opioid analgesics. The decision will be made following CDC guidelines. This decision will be based on the results of diagnostic studies, as well as Tamara Fleming's risk profile.  Membrane stabilizer: To be determined at a later time Muscle relaxant: To be determined at a later time NSAID: To be determined at a later time Other analgesic(s): To be determined at a later time   Interventional therapies under consideration: Tamara Fleming was informed that there is no guarantee that she would be a candidate for interventional therapies. The decision will be based on the results of diagnostic studies, as well as Tamara Fleming's risk profile.  Possible procedure(s): Possible bilateral hip injection Possible bilateral lumbar epidural steroid injection Possible bilateral lumbar facet block Possible bilateral lumbar radiofrequency ablation Possible caudal injection    Provider-requested follow-up: No Follow-up on file.  No future appointments.  Primary Care  Physician: Wicker, Cheryl, NP Location: [facility] Note by:  Date: 09/30/2016; Time: 3:40 PM  Pain Score Disclaimer: We use the NRS-11 scale. This is a self-reported, subjective measurement of pain severity with only modest accuracy. It is used primarily to identify   changes within a particular patient. It must be understood that outpatient pain scales are significantly less accurate that those used for research, where they can be applied under ideal controlled circumstances with minimal exposure to variables. In reality, the score is likely to be a combination of pain intensity and pain affect, where pain affect describes the degree of emotional arousal or changes in action readiness caused by the sensory experience of pain. Factors such as social and work situation, setting, emotional state, anxiety levels, expectation, and prior pain experience may influence pain perception and show large inter-individual differences that may also be affected by time variables.  Patient instructions provided during this appointment: Patient Instructions  Constipation, Adult Constipation is when a person has fewer bowel movements in a week than normal, has difficulty having a bowel movement, or has stools that are dry, hard, or larger than normal. Constipation may be caused by an underlying condition. It may become worse with age if a person takes certain medicines and does not take in enough fluids. Follow these instructions at home: Eating and drinking   Eat foods that have a lot of fiber, such as fresh fruits and vegetables, whole grains, and beans.  Limit foods that are high in fat, low in fiber, or overly processed, such as french fries, hamburgers, cookies, candies, and soda.  Drink enough fluid to keep your urine clear or pale yellow. General instructions  Exercise regularly or as told by your health care provider.  Go to the restroom when you have the urge to go. Do not  hold it in.  Take over-the-counter and prescription medicines only as told by your health care provider. These include any fiber supplements.  Practice pelvic floor retraining exercises, such as deep breathing while relaxing the lower abdomen and pelvic floor relaxation during bowel movements.  Watch your condition for any changes.  Keep all follow-up visits as told by your health care provider. This is important. Contact a health care provider if:  You have pain that gets worse.  You have a fever.  You do not have a bowel movement after 4 days.  You vomit.  You are not hungry.  You lose weight.  You are bleeding from the anus.  You have thin, pencil-like stools. Get help right away if:  You have a fever and your symptoms suddenly get worse.  You leak stool or have blood in your stool.  Your abdomen is bloated.  You have severe pain in your abdomen.  You feel dizzy or you faint. This information is not intended to replace advice given to you by your health care provider. Make sure you discuss any questions you have with your health care provider. Document Released: 12/20/2003 Document Revised: 10/11/2015 Document Reviewed: 09/11/2015 Elsevier Interactive Patient Education  2017 [employer]   

## 2016-09-30 NOTE — Progress Notes (Signed)
Results were reviewed and found to be: mildly abnormal  No acute injury or pathology identified  Review would suggest interventional pain management techniques may be of benefit 

## 2016-09-30 NOTE — Progress Notes (Signed)
Safety precautions to be maintained throughout the outpatient stay will include: orient to surroundings, keep bed in low position, maintain call bell within reach at all times, provide assistance with transfer out of bed and ambulation.  

## 2016-09-30 NOTE — Patient Instructions (Addendum)
Constipation, Adult Constipation is when a person has fewer bowel movements in a week than normal, has difficulty having a bowel movement, or has stools that are dry, hard, or larger than normal. Constipation may be caused by an underlying condition. It may become worse with age if a person takes certain medicines and does not take in enough fluids. Follow these instructions at home: Eating and drinking   Eat foods that have a lot of fiber, such as fresh fruits and vegetables, whole grains, and beans.  Limit foods that are high in fat, low in fiber, or overly processed, such as french fries, hamburgers, cookies, candies, and soda.  Drink enough fluid to keep your urine clear or pale yellow. General instructions  Exercise regularly or as told by your health care provider.  Go to the restroom when you have the urge to go. Do not hold it in.  Take over-the-counter and prescription medicines only as told by your health care provider. These include any fiber supplements.  Practice pelvic floor retraining exercises, such as deep breathing while relaxing the lower abdomen and pelvic floor relaxation during bowel movements.  Watch your condition for any changes.  Keep all follow-up visits as told by your health care provider. This is important. Contact a health care provider if:  You have pain that gets worse.  You have a fever.  You do not have a bowel movement after 4 days.  You vomit.  You are not hungry.  You lose weight.  You are bleeding from the anus.  You have thin, pencil-like stools. Get help right away if:  You have a fever and your symptoms suddenly get worse.  You leak stool or have blood in your stool.  Your abdomen is bloated.  You have severe pain in your abdomen.  You feel dizzy or you faint. This information is not intended to replace advice given to you by your health care provider. Make sure you discuss any questions you have with your health care  provider. Document Released: 12/20/2003 Document Revised: 10/11/2015 Document Reviewed: 09/11/2015 Elsevier Interactive Patient Education  2017 [employer].  ____________________________________________________________________________________________  Appointment Policy Summary  It is our goal and responsibility to provide the medical community with assistance in the evaluation and management of patients with chronic pain. Unfortunately our resources are limited. Because we do not have an unlimited amount of time, or available appointments, we are required to closely monitor and manage their use. The following rules exist to maximize their use:  Patient's responsibilities: 1. Punctuality: You are required to be physically present and registered in our facility at least 30 minutes before your appointment. 2. Tardiness: The cutoff is your appointment time. If you have an appointment scheduled for 10:00 AM and you arrive at 10:01, you will be required to reschedule your appointment.  3. Plan ahead: Always assume that you will encounter traffic on your way in. Plan for it. If you are dependent on a driver, make sure they understand these rules and the need to arrive early. 4. Other appointments and responsibilities: Avoid scheduling any other appointments before or after your pain clinic appointments.  5. Be prepared: Write down everything that you need to discuss with your healthcare provider and give this information to the admitting nurse. Write down the medications that you will need refilled. Bring your pills and bottles (even the empty ones), to all of your appointments, except for those where a procedure is scheduled. 6. No children or pets: Find someone to  take care of them. It is not appropriate to bring them in. 7. Scheduling changes: We request "advanced notification" of any changes or cancellations. 8. Advanced notification: Defined as a time period of more than 24 hours prior to the  originally scheduled appointment. This allows for the appointment to be offered to other patients. 9. Rescheduling: When a visit is rescheduled, it will require the cancellation of the original appointment. For this reason they both fall within the category of "Cancellations".  10. Cancellations: They require advanced notification. Any cancellation less than 24 hours before the  appointment will be recorded as a "No Show". 11. No Show: Defined as an unkept appointment where the patient failed to notify or declare to the practice their intention or inability to keep the appointment.  Corrective process for repeat offenders:  1. Tardiness: Three (3) episodes of rescheduling due to late arrivals will be recorded as one (1) "No Show". 2. Cancellation or reschedule: Three (3) cancellations or rescheduling will be recorded as one (1) "No Show". 3. "No Shows": Three (3) "No Shows" within a 12 month period will result in discharge from the practice.  ____________________________________________________________________________________________

## 2016-10-01 ENCOUNTER — Telehealth: Payer: Self-pay | Admitting: Unknown Physician Specialty

## 2016-10-01 NOTE — Telephone Encounter (Signed)
Called and let the pharmacist know that Malachy Mood gave the OK to fill patient's prescription early since she will run out while out of town. Will route this message to Cheryl's box so she can sign off since verbal orders were given.

## 2016-10-01 NOTE — Telephone Encounter (Signed)
Got in touch with Malachy Mood. Malachy Mood verbally stated that she gave the patient an additional prescription for when she has severe abdominal pain. But patient is not to take more than prescribed. Called Estill Bamberg back and let her know this. Estill Bamberg asked if I could call the patient and let her know because she states that the patient is thinking that Malachy Mood said that she could take more than what is prescribed to her. Will call patient and let her know.

## 2016-10-01 NOTE — Telephone Encounter (Signed)
Tried [employer], could not reach her. Jeannine Boga at the pharmacy back and told her that I could not get in touch with Malachy Mood and that there is no documentation that I see where Malachy Mood told the patient that she could take more of her hydrocodone if needed. I told Estill Bamberg that if we can get in touch with Malachy Mood, then we would let her know.

## 2016-10-01 NOTE — Telephone Encounter (Signed)
Estill Bamberg with [facility] stating the patient was told by Malachy Mood she could take more than the recommended script if she needed to do so.  Advised Estill Bamberg that Malachy Mood was not in and [place] is trying to get in touch with Malachy Mood to find out.  Estill Bamberg [phone]  Thanks

## 2016-10-01 NOTE — Telephone Encounter (Signed)
Called and spoke with patient. I let her know what all was going on. Patient stated that she was leaving to go out of town on vacation and just wanted to have her medication filled early because she will run out while they are gone. I called Malachy Mood back and Malachy Mood stated that it was OK for the pharmacy to fill the patient's prescription early. Will call pharmacy and let them know.

## 2016-10-04 LAB — COMPREHENSIVE METABOLIC PANEL
ALBUMIN: 4.2 g/dL (ref 3.5–4.8)
ALT: 19 IU/L (ref 0–32)
AST: 21 IU/L (ref 0–40)
Albumin/Globulin Ratio: 1.7 (ref 1.2–2.2)
Alkaline Phosphatase: 79 IU/L (ref 39–117)
BILIRUBIN TOTAL: 0.4 mg/dL (ref 0.0–1.2)
BUN / CREAT RATIO: 16 (ref 12–28)
BUN: 15 mg/dL (ref 8–27)
CHLORIDE: 98 mmol/L (ref 96–106)
CO2: 27 mmol/L (ref 20–29)
Calcium: 9.8 mg/dL (ref 8.7–10.3)
Creatinine, Ser: 0.95 mg/dL (ref 0.57–1.00)
GFR calc non Af Amer: 61 mL/min/{1.73_m2} (ref >59)
GFR, EST AFRICAN AMERICAN: 70 mL/min/{1.73_m2} (ref >59)
GLOBULIN, TOTAL: 2.5 g/dL (ref 1.5–4.5)
GLUCOSE: 99 mg/dL (ref 65–99)
Potassium: 4.7 mmol/L (ref 3.5–5.2)
Sodium: 139 mmol/L (ref 134–144)
TOTAL PROTEIN: 6.7 g/dL (ref 6.0–8.5)

## 2016-10-04 LAB — VITAMIN B12: VITAMIN B 12: 218 pg/mL — AB (ref 232–1245)

## 2016-10-04 LAB — 25-HYDROXY VITAMIN D LCMS D2+D3
25-Hydroxy, Vitamin D-2: <1 ng/mL
25-Hydroxy, Vitamin D-3: 30 ng/mL
25-Hydroxy, Vitamin D: 31 ng/mL

## 2016-10-04 LAB — C-REACTIVE PROTEIN: CRP: 4.1 mg/L (ref 0.0–4.9)

## 2016-10-04 LAB — MAGNESIUM: Magnesium: 1.6 mg/dL (ref 1.6–2.3)

## 2016-10-04 LAB — SEDIMENTATION RATE: SED RATE: 5 mm/h (ref 0–40)

## 2016-10-06 LAB — COMPLIANCE DRUG ANALYSIS, UR

## 2016-10-15 DIAGNOSIS — M1990 Unspecified osteoarthritis, unspecified site: Secondary | ICD-10-CM | POA: Diagnosis not present

## 2016-10-15 DIAGNOSIS — L821 Other seborrheic keratosis: Secondary | ICD-10-CM | POA: Diagnosis not present

## 2016-10-15 DIAGNOSIS — L400 Psoriasis vulgaris: Secondary | ICD-10-CM | POA: Diagnosis not present

## 2016-10-15 DIAGNOSIS — L920 Granuloma annulare: Secondary | ICD-10-CM | POA: Diagnosis not present

## 2016-10-16 ENCOUNTER — Other Ambulatory Visit: Payer: Self-pay | Admitting: Unknown Physician Specialty

## 2016-10-16 DIAGNOSIS — F419 Anxiety disorder, unspecified: Secondary | ICD-10-CM

## 2016-10-16 MED ORDER — HYDROCODONE-ACETAMINOPHEN 5-325 MG PO TABS: 1.0000 | ORAL_TABLET | Freq: Two times a day (BID) | ORAL | 0 refills | Status: DC

## 2016-10-16 MED ORDER — ALPRAZOLAM 0.25 MG PO TABS: 0.2500 mg | ORAL_TABLET | Freq: Every evening | ORAL | 2 refills | Status: DC | PRN

## 2016-10-20 DIAGNOSIS — I10 Essential (primary) hypertension: Secondary | ICD-10-CM | POA: Diagnosis not present

## 2016-10-20 DIAGNOSIS — R079 Chest pain, unspecified: Secondary | ICD-10-CM | POA: Diagnosis not present

## 2016-10-20 DIAGNOSIS — R002 Palpitations: Secondary | ICD-10-CM | POA: Diagnosis not present

## 2016-10-20 DIAGNOSIS — E7800 Pure hypercholesterolemia, unspecified: Secondary | ICD-10-CM | POA: Diagnosis not present

## 2016-11-11 ENCOUNTER — Other Ambulatory Visit: Payer: Self-pay | Admitting: Unknown Physician Specialty

## 2016-11-11 MED ORDER — HYDROCODONE-ACETAMINOPHEN 5-325 MG PO TABS: 1.0000 | ORAL_TABLET | Freq: Two times a day (BID) | ORAL | 0 refills | Status: DC

## 2016-11-11 NOTE — Progress Notes (Signed)
Referred to pain clinic.  They have not yet taken over her opioid contract.  This will be the last rx from this office

## 2016-11-27 ENCOUNTER — Other Ambulatory Visit: Payer: Self-pay | Admitting: Unknown Physician Specialty

## 2016-11-27 ENCOUNTER — Telehealth: Payer: Self-pay | Admitting: Unknown Physician Specialty

## 2016-11-27 MED ORDER — HYDROCODONE-ACETAMINOPHEN 5-325 MG PO TABS: 1.0000 | ORAL_TABLET | Freq: Two times a day (BID) | ORAL | 0 refills | Status: DC

## 2016-11-27 NOTE — Telephone Encounter (Signed)
Called pt to schedule for Annual Wellness Visit with Nurse Health Advisor, [place], my c/b # is [place] [facility] on Monday when return from vacation

## 2016-12-01 ENCOUNTER — Telehealth: Payer: Self-pay | Admitting: Unknown Physician Specialty

## 2016-12-01 NOTE — Telephone Encounter (Signed)
Called pt to schedule for Annual Wellness Visit with Nurse Health Advisor, Tiffany Hill, my c/b # is [phone]  Tamara Fleming ° °

## 2016-12-08 ENCOUNTER — Ambulatory Visit (INDEPENDENT_AMBULATORY_CARE_PROVIDER_SITE_OTHER): Payer: Medicare Other | Admitting: Unknown Physician Specialty

## 2016-12-08 ENCOUNTER — Encounter: Payer: Self-pay | Admitting: Unknown Physician Specialty

## 2016-12-08 DIAGNOSIS — G894 Chronic pain syndrome: Secondary | ICD-10-CM | POA: Diagnosis not present

## 2016-12-08 DIAGNOSIS — E538 Deficiency of other specified B group vitamins: Secondary | ICD-10-CM | POA: Diagnosis not present

## 2016-12-08 DIAGNOSIS — I10 Essential (primary) hypertension: Secondary | ICD-10-CM

## 2016-12-08 DIAGNOSIS — Z79891 Long term (current) use of opiate analgesic: Secondary | ICD-10-CM

## 2016-12-08 MED ORDER — LOSARTAN POTASSIUM-HCTZ 100-12.5 MG PO TABS: 1.0000 | ORAL_TABLET | Freq: Every day | ORAL | 1 refills | Status: DC

## 2016-12-08 MED ORDER — ATORVASTATIN CALCIUM 10 MG PO TABS: 10.0000 mg | ORAL_TABLET | Freq: Every day | ORAL | 1 refills | Status: DC

## 2016-12-08 MED ORDER — CYANOCOBALAMIN 1000 MCG/ML IJ SOLN
1000.0000 ug | Freq: Once | INTRAMUSCULAR | Status: AC
  Administered 2016-12-08: 1000 ug via INTRAMUSCULAR

## 2016-12-08 MED ORDER — HYDROCODONE-ACETAMINOPHEN 5-325 MG PO TABS: 1.0000 | ORAL_TABLET | Freq: Two times a day (BID) | ORAL | 0 refills | Status: DC

## 2016-12-08 NOTE — Assessment & Plan Note (Signed)
Encouraged to make f/u appt with pain management

## 2016-12-08 NOTE — Assessment & Plan Note (Signed)
Stable, continue present medications.   

## 2016-12-08 NOTE — Assessment & Plan Note (Signed)
B12 shot today. 

## 2016-12-08 NOTE — Assessment & Plan Note (Addendum)
Continue from me until she returns to pain management.  Last rx written 8/24 given.  May fill early.  Next rx not until October.

## 2016-12-08 NOTE — Progress Notes (Signed)
BP 121/74 (BP Location: Left Arm, Cuff Size: Normal)   Pulse 76   Temp 98.2 F (36.8 C)   Wt 179 lb 3.2 oz (81.3 kg)   LMP  (LMP Unknown)   SpO2 99%   BMI 29.82 kg/m    Subjective:    Patient ID: Tamara Fleming, female    DOB: June 22, 1945, 71 y.o.   MRN: [id number]  HPI: Tamara Fleming is a 71 y.o. female  Chief Complaint  Patient presents with  . Hypertension  . Pain   Hypertension Using medications without difficulty Average home BPs not checking   No problems or lightheadedness No chest pain with exertion or shortness of breath No Edema  Pain management Pt went to pain management on 6/27.  The goal was for them to take over pain management.  She states they have not gotten back to her about results or scans. I do see the notes and reviewed.  However no f/u is listed and a note that says interventional pain management might be useful.  Primary areas of pain are groin, buttocks and lower back.  She currently takes Vicoden 5 mg BID   Reviewed  Cervical MRI showing spinal stenosis C5-C6 with multi-level DDD Mild stenosis L3-L4,  L2-L3, L5-S1 Drug screen was negative B12 is low    Relevant past medical, surgical, family and social history reviewed and updated as indicated. Interim medical history since our last visit reviewed. Allergies and medications reviewed and updated.  Review of Systems  Per HPI unless specifically indicated above     Objective:    BP 121/74 (BP Location: Left Arm, Cuff Size: Normal)   Pulse 76   Temp 98.2 F (36.8 C)   Wt 179 lb 3.2 oz (81.3 kg)   LMP  (LMP Unknown)   SpO2 99%   BMI 29.82 kg/m   Wt Readings from Last 3 Encounters:  12/08/16 179 lb 3.2 oz (81.3 kg)  09/30/16 180 lb (81.6 kg)  09/15/16 177 lb 9.6 oz (80.6 kg)    Physical Exam  Constitutional: She is oriented to person, place, and time. She appears well-developed and well-nourished. No distress.  HENT:  Head: Normocephalic and atraumatic.  Eyes: Conjunctivae  and lids are normal. Right eye exhibits no discharge. Left eye exhibits no discharge. No scleral icterus.  Neck: Normal range of motion. Neck supple. No JVD present. Carotid bruit is not present.  Cardiovascular: Normal rate, regular rhythm and normal heart sounds.   Pulmonary/Chest: Effort normal and breath sounds normal.  Abdominal: Normal appearance. There is no splenomegaly or hepatomegaly.  Musculoskeletal: Normal range of motion.  Neurological: She is alert and oriented to person, place, and time.  Skin: Skin is warm, dry and intact. No rash noted. No pallor.  Psychiatric: She has a normal mood and affect. Her behavior is normal. Judgment and thought content normal.    Results for orders placed or performed in visit on 09/30/16  Comprehensive metabolic panel  Result Value Ref Range   Glucose 99 65 - 99 mg/dL   BUN 15 8 - 27 mg/dL   Creatinine, Ser 0.95 0.57 - 1.00 mg/dL   GFR calc non Af Amer 61 >59 mL/min/1.73   GFR calc Af Amer 70 >59 mL/min/1.73   BUN/Creatinine Ratio 16 12 - 28   Sodium 139 134 - 144 mmol/L   Potassium 4.7 3.5 - 5.2 mmol/L   Chloride 98 96 - 106 mmol/L   CO2 27 20 - 29 mmol/L  Calcium 9.8 8.7 - 10.3 mg/dL   Total Protein 6.7 6.0 - 8.5 g/dL   Albumin 4.2 3.5 - 4.8 g/dL   Globulin, Total 2.5 1.5 - 4.5 g/dL   Albumin/Globulin Ratio 1.7 1.2 - 2.2   Bilirubin Total 0.4 0.0 - 1.2 mg/dL   Alkaline Phosphatase 79 39 - 117 IU/L   AST 21 0 - 40 IU/L   ALT 19 0 - 32 IU/L  25-Hydroxyvitamin D Lcms D2+D3  Result Value Ref Range   25-Hydroxy, Vitamin D 31 ng/mL   25-Hydroxy, Vitamin D-2 <1.0 ng/mL   25-Hydroxy, Vitamin D-3 30 ng/mL  Sedimentation rate  Result Value Ref Range   Sed Rate 5 0 - 40 mm/hr  Vitamin B12  Result Value Ref Range   Vitamin B-12 218 (L) 232 - 1,245 pg/mL  Magnesium  Result Value Ref Range   Magnesium 1.6 1.6 - 2.3 mg/dL  C-reactive protein  Result Value Ref Range   CRP 4.1 0.0 - 4.9 mg/L      Assessment & Plan:   Problem List  Items Addressed This Visit      Unprioritized   B12 deficiency    B12 shot today      Relevant Medications   cyanocobalamin ((VITAMIN B-12)) injection 1,000 mcg (Completed)   Chronic pain syndrome (Chronic)    Encouraged to make f/u appt with pain management      Essential hypertension    Stable, continue present medications.        Relevant Medications   atorvastatin (LIPITOR) 10 MG tablet   losartan-hydrochlorothiazide (HYZAAR) 100-12.5 MG tablet   Long term prescription opiate use (Chronic)    Continue from me until she returns to pain management.  Last rx written 8/24 given.  May fill early.  Next rx not until October.            Follow up plan: Return in about 3 months (around 03/09/2017) for physical.

## 2017-01-06 ENCOUNTER — Telehealth: Payer: Self-pay

## 2017-01-06 NOTE — Telephone Encounter (Signed)
Called and scheduled a medicare annual wellness visit with patient on 01/27/2017 at 2:30pm. Pt confirmed

## 2017-01-08 ENCOUNTER — Other Ambulatory Visit: Payer: Self-pay | Admitting: Unknown Physician Specialty

## 2017-01-08 MED ORDER — HYDROCODONE-ACETAMINOPHEN 5-325 MG PO TABS: 1.0000 | ORAL_TABLET | Freq: Two times a day (BID) | ORAL | 0 refills | Status: DC

## 2017-01-11 ENCOUNTER — Other Ambulatory Visit: Payer: Self-pay | Admitting: Unknown Physician Specialty

## 2017-01-11 DIAGNOSIS — F419 Anxiety disorder, unspecified: Secondary | ICD-10-CM

## 2017-01-13 DIAGNOSIS — M545 Low back pain: Secondary | ICD-10-CM | POA: Diagnosis not present

## 2017-01-13 DIAGNOSIS — M25551 Pain in right hip: Secondary | ICD-10-CM | POA: Diagnosis not present

## 2017-01-13 DIAGNOSIS — M25552 Pain in left hip: Secondary | ICD-10-CM | POA: Diagnosis not present

## 2017-01-20 ENCOUNTER — Ambulatory Visit (INDEPENDENT_AMBULATORY_CARE_PROVIDER_SITE_OTHER): Payer: Medicare Other

## 2017-01-20 DIAGNOSIS — Z23 Encounter for immunization: Secondary | ICD-10-CM | POA: Diagnosis not present

## 2017-01-21 DIAGNOSIS — M25552 Pain in left hip: Secondary | ICD-10-CM | POA: Diagnosis not present

## 2017-01-27 ENCOUNTER — Ambulatory Visit: Payer: Medicare Other

## 2017-02-01 ENCOUNTER — Other Ambulatory Visit: Payer: Self-pay | Admitting: Orthopedic Surgery

## 2017-02-01 DIAGNOSIS — M545 Low back pain, unspecified: Secondary | ICD-10-CM

## 2017-02-01 DIAGNOSIS — M25552 Pain in left hip: Secondary | ICD-10-CM | POA: Diagnosis not present

## 2017-02-05 ENCOUNTER — Other Ambulatory Visit: Payer: Self-pay | Admitting: Unknown Physician Specialty

## 2017-02-05 MED ORDER — HYDROCODONE-ACETAMINOPHEN 5-325 MG PO TABS: 1.0000 | ORAL_TABLET | Freq: Two times a day (BID) | ORAL | 0 refills | Status: DC

## 2017-02-06 ENCOUNTER — Ambulatory Visit
Admission: RE | Admit: 2017-02-06 | Discharge: 2017-02-06 | Disposition: A | Discharge status: Home / Self Care | Payer: Medicare Other | Source: Ambulatory Visit | Attending: Orthopedic Surgery | Admitting: Orthopedic Surgery

## 2017-02-06 DIAGNOSIS — M545 Low back pain, unspecified: Secondary | ICD-10-CM

## 2017-02-06 DIAGNOSIS — M5127 Other intervertebral disc displacement, lumbosacral region: Secondary | ICD-10-CM | POA: Diagnosis not present

## 2017-02-17 ENCOUNTER — Encounter: Payer: Self-pay | Admitting: Unknown Physician Specialty

## 2017-02-17 ENCOUNTER — Ambulatory Visit (INDEPENDENT_AMBULATORY_CARE_PROVIDER_SITE_OTHER): Payer: Medicare Other | Admitting: Unknown Physician Specialty

## 2017-02-17 DIAGNOSIS — M1612 Unilateral primary osteoarthritis, left hip: Secondary | ICD-10-CM

## 2017-02-17 DIAGNOSIS — G5701 Lesion of sciatic nerve, right lower limb: Secondary | ICD-10-CM | POA: Diagnosis not present

## 2017-02-17 DIAGNOSIS — K5792 Diverticulitis of intestine, part unspecified, without perforation or abscess without bleeding: Secondary | ICD-10-CM

## 2017-02-17 HISTORY — DX: Diverticulitis of intestine, part unspecified, without perforation or abscess without bleeding: K57.92

## 2017-02-17 HISTORY — DX: Unilateral primary osteoarthritis, left hip: M16.12

## 2017-02-17 HISTORY — DX: Lesion of sciatic nerve, right lower limb: G57.01

## 2017-02-17 LAB — CBC WITH DIFFERENTIAL/PLATELET
HEMOGLOBIN: 11 g/dL — AB (ref 11.1–15.9)
Hematocrit: 35 % (ref 34.0–46.6)
Lymphocytes Absolute: 1.8 10*3/uL (ref 0.7–3.1)
Lymphs: 26 %
MCH: 32.3 pg (ref 26.6–33.0)
MCHC: 31.4 g/dL — AB (ref 31.5–35.7)
MCV: 103 fL — ABNORMAL HIGH (ref 79–97)
MID (Absolute): 1.1 10*3/uL (ref 0.1–1.6)
MID: 17 %
NEUTROS PCT: 58 %
Neutrophils Absolute: 4 10*3/uL (ref 1.4–7.0)
Platelets: 234 10*3/uL (ref 150–379)
RBC: 3.41 x10E6/uL — AB (ref 3.77–5.28)
RDW: 12.2 % — ABNORMAL LOW (ref 12.3–15.4)
WBC: 6.9 10*3/uL (ref 3.4–10.8)

## 2017-02-17 MED ORDER — AMOXICILLIN-POT CLAVULANATE 250-62.5 MG/5ML PO SUSR: 500.0000 mg | Freq: Three times a day (TID) | ORAL | 0 refills | Status: AC

## 2017-02-17 MED ORDER — METRONIDAZOLE 500 MG PO TABS: 500.0000 mg | ORAL_TABLET | Freq: Three times a day (TID) | ORAL | 0 refills | Status: DC

## 2017-02-17 MED ORDER — HYDROCODONE-ACETAMINOPHEN 5-325 MG PO TABS: 1.0000 | ORAL_TABLET | Freq: Three times a day (TID) | ORAL | 0 refills | Status: DC

## 2017-02-17 NOTE — Assessment & Plan Note (Addendum)
Current flare.  Rx for Metronidazole and Augmentin.  Liquid iven [er pt preference.  Clear liquid diet.  WBCs 6.9

## 2017-02-17 NOTE — Assessment & Plan Note (Addendum)
Hip osteoathritis.  Will increase Hydrocodone to TID.  Will write a prescription to do not fill until 11/21 when her last BID script will run out taking TID.  We can prescribe until her surgery date on 1/8.  Add Celbrex 200 mg daily.  Discussed and demonstrated cane walking which seemed to help her hip

## 2017-02-17 NOTE — Assessment & Plan Note (Addendum)
Exercises given and demonstrated  

## 2017-02-17 NOTE — Progress Notes (Signed)
BP (!) 148/80   Pulse 77   Temp 98.7 F (37.1 C) (Oral)   Wt 180 lb 3.2 oz (81.7 kg)   LMP  (LMP Unknown)   SpO2 100%   BMI 29.99 kg/m    Subjective:    Patient ID: Tamara Fleming, female    DOB: February 15, 1946, 71 y.o.   MRN: [id number]  HPI: Tamara Fleming is a 71 y.o. female  Chief Complaint  Patient presents with  . Diverticulitis    pt states she is having a diverticulitis flare, states she has been having pain off and on for 3 days   Left hip Pt with left hip OA.  She ended up in a wheelchair twice while on a cruise.  She is told her hip has dramatically changed and now bone on bone.Marland Kitchen Her hip replacement is scheduled for January.  She feels her pain medication needs have increased.  She is taking Hydrocodone TID rather than BID and needs a script to reflect that.    She also has pain in right buttock.    Diverticulitis She has a history of diverticulitis with 5-6 flares.  Last flare was less than 1 year ago.  Mild symptoms for 2 days.  More severe starting today.  No vomiting but nauseated off and on.  Last time I gave her Flagyl and Augmentin early.  Intolerant to Cipro.  No blood in urine  Relevant past medical, surgical, family and social history reviewed and updated as indicated. Interim medical history since our last visit reviewed. Allergies and medications reviewed and updated.  Review of Systems  Per HPI unless specifically indicated above     Objective:    BP (!) 148/80   Pulse 77   Temp 98.7 F (37.1 C) (Oral)   Wt 180 lb 3.2 oz (81.7 kg)   LMP  (LMP Unknown)   SpO2 100%   BMI 29.99 kg/m   Wt Readings from Last 3 Encounters:  02/17/17 180 lb 3.2 oz (81.7 kg)  12/08/16 179 lb 3.2 oz (81.3 kg)  09/30/16 180 lb (81.6 kg)    Physical Exam  Constitutional: She is oriented to person, place, and time. She appears well-developed and well-nourished. No distress.  HENT:  Head: Normocephalic and atraumatic.  Eyes: Conjunctivae and lids are normal.  Right eye exhibits no discharge. Left eye exhibits no discharge. No scleral icterus.  Neck: Normal range of motion. Neck supple. No JVD present. Carotid bruit is not present.  Cardiovascular: Normal rate, regular rhythm and normal heart sounds.  Pulmonary/Chest: Effort normal and breath sounds normal.  Abdominal: Normal appearance. There is no splenomegaly or hepatomegaly.  Musculoskeletal: Normal range of motion.  Gait atalgic  Neurological: She is alert and oriented to person, place, and time.  Skin: Skin is warm, dry and intact. No rash noted. No pallor.  Psychiatric: She has a normal mood and affect. Her behavior is normal. Judgment and thought content normal.    Results for orders placed or performed in visit on 09/30/16  Comprehensive metabolic panel  Result Value Ref Range   Glucose 99 65 - 99 mg/dL   BUN 15 8 - 27 mg/dL   Creatinine, Ser 0.95 0.57 - 1.00 mg/dL   GFR calc non Af Amer 61 >59 mL/min/1.73   GFR calc Af Amer 70 >59 mL/min/1.73   BUN/Creatinine Ratio 16 12 - 28   Sodium 139 134 - 144 mmol/L   Potassium 4.7 3.5 - 5.2 mmol/L   Chloride 98  96 - 106 mmol/L   CO2 27 20 - 29 mmol/L   Calcium 9.8 8.7 - 10.3 mg/dL   Total Protein 6.7 6.0 - 8.5 g/dL   Albumin 4.2 3.5 - 4.8 g/dL   Globulin, Total 2.5 1.5 - 4.5 g/dL   Albumin/Globulin Ratio 1.7 1.2 - 2.2   Bilirubin Total 0.4 0.0 - 1.2 mg/dL   Alkaline Phosphatase 79 39 - 117 IU/L   AST 21 0 - 40 IU/L   ALT 19 0 - 32 IU/L  25-Hydroxyvitamin D Lcms D2+D3  Result Value Ref Range   25-Hydroxy, Vitamin D 31 ng/mL   25-Hydroxy, Vitamin D-2 <1.0 ng/mL   25-Hydroxy, Vitamin D-3 30 ng/mL  Sedimentation rate  Result Value Ref Range   Sed Rate 5 0 - 40 mm/hr  Vitamin B12  Result Value Ref Range   Vitamin B-12 218 (L) 232 - 1,245 pg/mL  Magnesium  Result Value Ref Range   Magnesium 1.6 1.6 - 2.3 mg/dL  C-reactive protein  Result Value Ref Range   CRP 4.1 0.0 - 4.9 mg/L      Assessment & Plan:   Problem List Items  Addressed This Visit      Unprioritized   Arthritis of left hip    Hip osteoathritis.  Will increase Hydrocodone to TID.  Will write a prescription to do not fill until 11/21 when her last BID script will run out taking TID.  We can prescribe until her surgery date on 1/8.  Add Celbrex 200 mg daily.  Discussed and demonstrated cane walking which seemed to help her hip      Relevant Medications   HYDROcodone-acetaminophen (NORCO/VICODIN) 5-325 MG tablet   Diverticulitis    Current flare.  Rx for Metronidazole and Augmentin.  Liquid iven [er pt preference.  Clear liquid diet.  WBCs 6.9      Relevant Orders   CBC With Differential/Platelet   Piriformis syndrome of right side    Exercises given and demonstrated          Follow up plan: Return if symptoms worsen or fail to improve.

## 2017-03-10 DIAGNOSIS — S76311D Strain of muscle, fascia and tendon of the posterior muscle group at thigh level, right thigh, subsequent encounter: Secondary | ICD-10-CM | POA: Diagnosis not present

## 2017-03-10 DIAGNOSIS — M25551 Pain in right hip: Secondary | ICD-10-CM | POA: Diagnosis not present

## 2017-03-12 ENCOUNTER — Other Ambulatory Visit: Payer: Self-pay | Admitting: Unknown Physician Specialty

## 2017-03-17 ENCOUNTER — Ambulatory Visit: Payer: Self-pay

## 2017-03-17 ENCOUNTER — Other Ambulatory Visit: Payer: Self-pay | Admitting: Unknown Physician Specialty

## 2017-03-17 MED ORDER — HYDROCODONE-ACETAMINOPHEN 5-325 MG PO TABS: 1.0000 | ORAL_TABLET | Freq: Three times a day (TID) | ORAL | 0 refills | Status: DC

## 2017-03-22 DIAGNOSIS — M25551 Pain in right hip: Secondary | ICD-10-CM | POA: Diagnosis not present

## 2017-03-23 ENCOUNTER — Encounter: Payer: Self-pay | Admitting: Unknown Physician Specialty

## 2017-03-23 ENCOUNTER — Ambulatory Visit (INDEPENDENT_AMBULATORY_CARE_PROVIDER_SITE_OTHER): Payer: Medicare Other | Admitting: Unknown Physician Specialty

## 2017-03-23 VITALS — BP 131/79 | HR 66 | Temp 98.0°F | Wt 174.6 lb

## 2017-03-23 DIAGNOSIS — M1612 Unilateral primary osteoarthritis, left hip: Secondary | ICD-10-CM

## 2017-03-23 DIAGNOSIS — G5701 Lesion of sciatic nerve, right lower limb: Secondary | ICD-10-CM

## 2017-03-23 DIAGNOSIS — Z01818 Encounter for other preprocedural examination: Secondary | ICD-10-CM

## 2017-03-23 DIAGNOSIS — S76011D Strain of muscle, fascia and tendon of right hip, subsequent encounter: Secondary | ICD-10-CM | POA: Diagnosis not present

## 2017-03-23 DIAGNOSIS — M7071 Other bursitis of hip, right hip: Secondary | ICD-10-CM | POA: Diagnosis not present

## 2017-03-23 HISTORY — DX: Unilateral primary osteoarthritis, left hip: M16.12

## 2017-03-23 NOTE — H&P (Signed)
PREOPERATIVE H&P Patient ID: KYERRA VARGO MRN: [id number] DOB/AGE: 1945/09/05 71 y.o.  Chief Complaint: OA LEFT HIP  Planned Procedure Date: 04/13/2017 Medical and cardiac clearance by Dr. Julian Hy pending   HPI: Tamara Fleming is a 71 y.o. female who presents for evaluation of OA LEFT HIP. The patient has a history of pain and functional disability in the left hip due to arthritis and has failed non-surgical conservative treatments for greater than 12 weeks to include NSAID's and/or analgesics, corticosteriod injections and activity modification.  Onset of symptoms was gradual, starting 2 years ago with gradually worsening course since that time.Patient currently rates pain at 8 out of 10 with activity. Patient has night pain, worsening of pain with activity and weight bearing and pain that interferes with activities of daily living.  Patient has evidence of subchondral cysts, subchondral sclerosis, periarticular osteophytes and joint space narrowing by imaging studies.  There is no active infection.  Past Medical History:  Diagnosis Date  . Carpal tunnel syndrome 05/09/2014   Right  . Chest pain   . Chronic low back pain   . Fatigue   . Gout   . Heart murmur   . Hiatal hernia   . Hip pain    Left  . Hyperlipidemia   . Hypertension   . OA (osteoarthritis)    Past Surgical History:  Procedure Laterality Date  . ABDOMINAL HYSTERECTOMY     Partial  . ARM SURGERY     OF THE ULNA NERVE  . BREAST REDUCTION SURGERY  06/11/15  . BREAST TUMOR REMOVAL    . CARDIOVASCULAR STRESS TEST  08/11/2006   NORMAL. EF 55-60%  . CARDIOVASCULAR STRESS TEST  10/31/1998   EF 60% AT REST TO 75-80% POST EXERCISE  . ESOPHAGOGASTRODUODENOSCOPY  05/16/1990  . ESOPHAGOGASTRODUODENOSCOPY (EGD) WITH PROPOFOL N/A 02/04/2015   Procedure: ESOPHAGOGASTRODUODENOSCOPY (EGD) WITH PROPOFOL;  Surgeon: Hulen Luster, MD;  Location: [facility] ENDOSCOPY;  Service: Gastroenterology;  Laterality: N/A;   Allergies  Allergen  Reactions  . Ciprofloxacin Anaphylaxis  . Floxin [Ofloxacin]   . Sulfa Antibiotics Diarrhea and Nausea And Vomiting   Prior to Admission medications   Medication Sig Start Date End Date Taking? Authorizing Provider  ALPRAZolam Duanne Moron) 0.25 MG tablet TAKE 1 TABLET BY MOUTH AT BEDTIME AS NEEDED 01/11/17   Kathrine Haddock, NP  aspirin 81 MG tablet Take 81 mg by mouth daily.     [provider]  atorvastatin (LIPITOR) 10 MG tablet Take 1 tablet (10 mg total) by mouth at bedtime. 12/08/16   Kathrine Haddock, NP  furosemide (LASIX) 20 MG tablet Take 20 mg by mouth daily.  09/29/16 09/29/17  [provider]  HYDROcodone-acetaminophen (NORCO/VICODIN) 5-325 MG tablet Take 1 tablet by mouth 3 (three) times daily. 03/17/17   Kathrine Haddock, NP  losartan-hydrochlorothiazide [facility]) 100-12.5 MG tablet TAKE 1 TABLET BY MOUTH ONCE DAILY 03/12/17   Kathrine Haddock, NP  metroNIDAZOLE (FLAGYL) 500 MG tablet Take 1 tablet (500 mg total) 3 (three) times daily by mouth. 02/17/17   Kathrine Haddock, NP   Social History   Socioeconomic History  . Marital status: Married    Spouse name: Not on file  . Number of children: Not on file  . Years of education: Not on file  . Highest education level: Not on file  Social Needs  . Financial resource strain: Not on file  . Food insecurity - worry: Not on file  . Food insecurity - inability: Not on file  . Transportation  needs - medical: Not on file  . Transportation needs - non-medical: Not on file  Occupational History  . Not on file  Tobacco Use  . Smoking status: Never Smoker  . Smokeless tobacco: Never Used  Substance and Sexual Activity  . Alcohol use: Yes    Comment: wine, daily  . Drug use: No  . Sexual activity: Not Currently    Birth control/protection: None  Other Topics Concern  . Not on file  Social History Narrative  . Not on file   Family History  Problem Relation Age of Onset  . Coronary artery disease Mother   . Cancer Father         Lung    ROS: Currently denies lightheadedness, dizziness, Fever, chills, CP, SOB.   No personal history of DVT, PE, MI, or CVA. No loose teeth or dentures All other systems have been reviewed and were otherwise currently negative with the exception of those mentioned in the HPI and as above.  Objective: Vitals: Ht: 5 feet 5 inches wt: 175 temp: 98.5 BP: 155/78 pulse: 64 O2 97 % on room air. Physical Exam: General: Alert, NAD.  Trendelenberg Gait  HEENT: EOMI, Good Neck Extension  Pulm: No increased work of breathing.  Clear B/L A/P w/o crackle or wheeze.  CV: RRR, No m/g/r appreciated  GI: soft, NT, ND Neuro: Neuro without gross focal deficit.  Sensation intact distally Skin: No lesions in the area of chief complaint MSK/Surgical Site: Left hip Non tender over greater trochanter.  Pain with passive ROM.  Positive Stinchfield.  5/5 strength.  NVI.  Sensation intact distally.   Imaging Review Plain radiographs demonstrate severe degenerative joint disease of the left hip.   Assessment: OA LEFT HIP Principal Problem:   Primary osteoarthritis of left hip Active Problems:   Dyslipidemia   Essential hypertension   Plan: Plan for Procedure(s): TOTAL HIP ARTHROPLASTY ANTERIOR APPROACH  The patient history, physical exam, clinical judgement of the provider and imaging are consistent with end stage degenerative joint disease and total joint arthroplasty is deemed medically necessary. The treatment options including medical management, injection therapy, and arthroplasty were discussed at length. The risks and benefits of Procedure(s): TOTAL HIP ARTHROPLASTY ANTERIOR APPROACH were presented and reviewed.  The risks of nonoperative treatment, versus surgical intervention including but not limited to continued pain, aseptic loosening, stiffness, dislocation/subluxation, infection, bleeding, nerve injury, blood clots, cardiopulmonary complications, morbidity, mortality, among others  were discussed. The patient verbalizes understanding and wishes to proceed with the plan.  Patient is being admitted for inpatient treatment for surgery, pain control, PT, OT, prophylactic antibiotics, VTE prophylaxis, progressive ambulation, ADL's and discharge planning.   Dental prophylaxis discussed and recommended for 2 years postoperatively.   The patient does meet the criteria for TXA which will be used perioperatively via IV.    ASA 325 mg will be used postoperatively for DVT prophylaxis in addition to SCDs, and early ambulation.  The patient is planning to be discharged home with home health services ([facility]) in care of her husband, Araceli Bouche.  Severity of Illness: The appropriate patient status for this patient is OBSERVATION. Observation status is judged to be reasonable and necessary in order to provide the required intensity of service to ensure the patient's safety. The patient's presenting symptoms, physical exam findings, and initial radiographic and laboratory data in the context of their medical condition is felt to place them at decreased risk for further clinical deterioration. Furthermore, it is anticipated that the patient will be  medically stable for discharge from the hospital within 2 midnights of admission. The following factors support the patient status of observation.     Prudencio Burly III, PA-C 03/23/2017 10:51 AM

## 2017-03-23 NOTE — Assessment & Plan Note (Signed)
Getting PT.  ? Hamstring pain per orthopedics.

## 2017-03-23 NOTE — Progress Notes (Signed)
BP 131/79   Pulse 66   Temp 98 F (36.7 C) (Oral)   Wt 174 lb 9.6 oz (79.2 kg)   LMP  (LMP Unknown)   SpO2 98%   BMI 29.05 kg/m    Subjective:    Patient ID: Tamara Fleming, female    DOB: November 23, 1945, 71 y.o.   MRN: [id number]  HPI: Tamara Fleming is a 71 y.o. female  Chief Complaint  Patient presents with  . Surgery Clearance    pt scheduled for hip replacement 04/13/17   Pt having a hip replacement 04/23/17.  She is currently walking with a cane.  Here for surgical clearance.  She has had anesthesia before and has done well.  She is on a statin. EKG and blood work planned prior to surgery.  She is now taking Hydrocodone 5-325 mg TID.  She is on a 28 day cycle until surgery.    Right buttock pain Under the care of Orthopedics.  Getting PT and steroid injection.  ? Hamstring pain  Relevant past medical, surgical, family and social history reviewed and updated as indicated. Interim medical history since our last visit reviewed. Allergies and medications reviewed and updated.  Review of Systems  Constitutional: Negative.   HENT: Negative.   Respiratory: Negative.   Cardiovascular: Negative.     Per HPI unless specifically indicated above     Objective:    BP 131/79   Pulse 66   Temp 98 F (36.7 C) (Oral)   Wt 174 lb 9.6 oz (79.2 kg)   LMP  (LMP Unknown)   SpO2 98%   BMI 29.05 kg/m   Wt Readings from Last 3 Encounters:  03/23/17 174 lb 9.6 oz (79.2 kg)  02/17/17 180 lb 3.2 oz (81.7 kg)  12/08/16 179 lb 3.2 oz (81.3 kg)    Physical Exam  Constitutional: She is oriented to person, place, and time. She appears well-developed and well-nourished. No distress.  HENT:  Head: Normocephalic and atraumatic.  Eyes: Conjunctivae and lids are normal. Right eye exhibits no discharge. Left eye exhibits no discharge. No scleral icterus.  Neck: Normal range of motion. Neck supple. No JVD present. Carotid bruit is not present.  Cardiovascular: Normal rate, regular rhythm  and normal heart sounds.  Pulmonary/Chest: Effort normal and breath sounds normal.  Abdominal: Normal appearance. There is no splenomegaly or hepatomegaly.  Musculoskeletal: Normal range of motion.  Neurological: She is alert and oriented to person, place, and time.  Skin: Skin is warm, dry and intact. No rash noted. No pallor.  Psychiatric: She has a normal mood and affect. Her behavior is normal. Judgment and thought content normal.    Results for orders placed or performed in visit on 02/17/17  CBC With Differential/Platelet  Result Value Ref Range   WBC 6.9 3.4 - 10.8 x10E3/uL   RBC 3.41 (L) 3.77 - 5.28 x10E6/uL   Hemoglobin 11.0 (L) 11.1 - 15.9 g/dL   Hematocrit 35.0 34.0 - 46.6 %   MCV 103 (H) 79 - 97 fL   MCH 32.3 26.6 - 33.0 pg   MCHC 31.4 (L) 31.5 - 35.7 g/dL   RDW 12.2 (L) 12.3 - 15.4 %   Platelets 234 150 - 379 x10E3/uL   Neutrophils 58 Not Estab. %   Lymphs 26 Not Estab. %   MID 17 Not Estab. %   Neutrophils Absolute 4.0 1.4 - 7.0 x10E3/uL   Lymphocytes Absolute 1.8 0.7 - 3.1 x10E3/uL   MID (Absolute) 1.1 0.1 -  1.6 X10E3/uL      Assessment & Plan:   Problem List Items Addressed This Visit      Unprioritized   Arthritis of left hip    Surgery in January.  Will need to discuss hydrocodone taper following recovery      Relevant Medications   naproxen (NAPROSYN) 500 MG tablet   Piriformis syndrome of right side    Getting PT.  ? Hamstring pain per orthopedics.         Other Visit Diagnoses    Pre-op evaluation    -  Primary   OK for surgery.  EKG and bloodwork planned there.         Follow up plan: Return in about 3 months (around 06/21/2017), or if symptoms worsen or fail to improve.

## 2017-03-23 NOTE — Assessment & Plan Note (Signed)
Surgery in January.  Will need to discuss hydrocodone taper following recovery

## 2017-03-25 ENCOUNTER — Other Ambulatory Visit: Payer: Self-pay | Admitting: Unknown Physician Specialty

## 2017-04-01 NOTE — Pre-Procedure Instructions (Signed)
Tamara Fleming  04/01/2017      [place], [place] [address] Phone: [phone] Fax: [phone]    Your procedure is scheduled on April 13, 2017.  Report to [facility] Admitting at 1000 A.M.  Call this number if you have problems the morning of surgery:  [phone]   Remember:  Do not eat food or drink liquids after midnight.  Take these medicines the morning of surgery with A SIP OF WATER Hydrocodone-acetaminophen (Norco/Vicodin) if needed.  7 days prior to surgery STOP taking any Aspirin (unless otherwise instructed by your surgeon), Aleve, Naproxen, Ibuprofen, Motrin, Advil, Goody's, BC's, all herbal medications, fish oil, and all vitamins   Do not wear jewelry, make-up or nail polish.  Do not wear lotions, powders, or perfumes, or deodorant.  Do not shave 48 hours prior to surgery.   Do not bring valuables to the hospital.  [facility] is not responsible for any belongings or valuables.  Contacts, dentures or bridgework may not be worn into surgery.  Leave your suitcase in the car.  After surgery it may be brought to your room.  For patients admitted to the hospital, discharge time will be determined by your treatment team.  Patients discharged the day of surgery will not be allowed to drive home.    [place]- Preparing For Surgery  Before surgery, you can play an important role. Because skin is not sterile, your skin needs to be as free of germs as possible. You can reduce the number of germs on your skin by washing with CHG (chlorahexidine gluconate) Soap before surgery.  CHG is an antiseptic cleaner which kills germs and bonds with the skin to continue killing germs even after washing.  Please do not use if you have an allergy to CHG or antibacterial soaps. If your skin becomes reddened/irritated stop using the CHG.  Do not shave (including legs and underarms) for at least 48 hours prior to  first CHG shower. It is OK to shave your face.  Please follow these instructions carefully.   1. Shower the NIGHT BEFORE SURGERY and the MORNING OF SURGERY with CHG.   2. If you chose to wash your hair, wash your hair first as usual with your normal shampoo.  3. After you shampoo, rinse your hair and body thoroughly to remove the shampoo.  4. Use CHG as you would any other liquid soap. You can apply CHG directly to the skin and wash gently with a scrungie or a clean washcloth.   5. Apply the CHG Soap to your body ONLY FROM THE NECK DOWN.  Do not use on open wounds or open sores. Avoid contact with your eyes, ears, mouth and genitals (private parts). Wash Face and genitals (private parts)  with your normal soap.  6. Wash thoroughly, paying special attention to the area where your surgery will be performed.  7. Thoroughly rinse your body with warm water from the neck down.  8. DO NOT shower/wash with your normal soap after using and rinsing off the CHG Soap.  9. Pat yourself dry with a CLEAN TOWEL.  10. Wear CLEAN PAJAMAS to bed the night before surgery, wear comfortable clothes the morning of surgery  11. Place CLEAN SHEETS on your bed the night of your first shower and DO NOT SLEEP WITH PETS.    Day of Surgery: Do not apply any deodorants/lotions. Please wear clean clothes  to the hospital/surgery center.      Please read over the following fact sheets that you were given. Pain Booklet, Coughing and Deep Breathing, Total Joint Packet, MRSA Information and Surgical Site Infection Prevention

## 2017-04-02 ENCOUNTER — Other Ambulatory Visit: Payer: Self-pay

## 2017-04-02 ENCOUNTER — Encounter (HOSPITAL_COMMUNITY): Payer: Self-pay

## 2017-04-02 ENCOUNTER — Encounter (HOSPITAL_COMMUNITY)
Admission: RE | Admit: 2017-04-02 | Discharge: 2017-04-02 | Disposition: A | Discharge status: Home / Self Care | Payer: Medicare Other | Source: Ambulatory Visit | Attending: Orthopedic Surgery | Admitting: Orthopedic Surgery

## 2017-04-02 DIAGNOSIS — Z01812 Encounter for preprocedural laboratory examination: Secondary | ICD-10-CM | POA: Insufficient documentation

## 2017-04-02 DIAGNOSIS — Z01810 Encounter for preprocedural cardiovascular examination: Secondary | ICD-10-CM | POA: Insufficient documentation

## 2017-04-02 DIAGNOSIS — M1612 Unilateral primary osteoarthritis, left hip: Secondary | ICD-10-CM | POA: Diagnosis not present

## 2017-04-02 HISTORY — DX: Other complications of anesthesia, initial encounter: T88.59XA

## 2017-04-02 HISTORY — DX: Gastro-esophageal reflux disease without esophagitis: K21.9

## 2017-04-02 HISTORY — DX: Peripheral vascular disease, unspecified: I73.9

## 2017-04-02 HISTORY — DX: Adverse effect of unspecified anesthetic, initial encounter: T41.45XA

## 2017-04-02 LAB — CBC
HEMATOCRIT: 36.4 % (ref 36.0–46.0)
HEMOGLOBIN: 11.8 g/dL — AB (ref 12.0–15.0)
MCH: 33.1 pg (ref 26.0–34.0)
MCHC: 32.4 g/dL (ref 30.0–36.0)
MCV: 102 fL — AB (ref 78.0–100.0)
Platelets: 193 10*3/uL (ref 150–400)
RBC: 3.57 MIL/uL — ABNORMAL LOW (ref 3.87–5.11)
RDW: 12.7 % (ref 11.5–15.5)
WBC: 7.1 10*3/uL (ref 4.0–10.5)

## 2017-04-02 LAB — BASIC METABOLIC PANEL
ANION GAP: 12 (ref 5–15)
BUN: 16 mg/dL (ref 6–20)
CALCIUM: 9.2 mg/dL (ref 8.9–10.3)
CHLORIDE: 102 mmol/L (ref 101–111)
CO2: 24 mmol/L (ref 22–32)
Creatinine, Ser: 0.86 mg/dL (ref 0.44–1.00)
GFR calc Af Amer: >60 mL/min (ref >60)
GFR calc non Af Amer: >60 mL/min (ref >60)
GLUCOSE: 107 mg/dL — AB (ref 65–99)
Potassium: 3.6 mmol/L (ref 3.5–5.1)
Sodium: 138 mmol/L (ref 135–145)

## 2017-04-02 LAB — SURGICAL PCR SCREEN
MRSA, PCR: NEGATIVE
Staphylococcus aureus: NEGATIVE

## 2017-04-02 NOTE — Pre-Procedure Instructions (Signed)
AZARIAH LATENDRESSE  04/02/2017      [place], [place] [address] Phone: [phone] Fax: [phone]    Your procedure is scheduled on April 13, 2017.  Report to [facility] Admitting at 1000 A.M.  Call this number if you have problems the morning of surgery:  [phone]   Remember:  Do not eat food or drink liquids after midnight.  Take these medicines the morning of surgery with A SIP OF WATER Hydrocodone-acetaminophen (Norco/Vicodin) if needed.  7 days prior to surgery 04/06/17 STOP taking any Aspirin (unless otherwise instructed by your surgeon), Aleve, Naproxen, Ibuprofen, Motrin, Advil, Goody's, BC's, all herbal medications, fish oil, and all vitamins   Do not wear jewelry, make-up or nail polish.  Do not wear lotions, powders, or perfumes, or deodorant.  Do not shave 48 hours prior to surgery.   Do not bring valuables to the hospital.  [facility] is not responsible for any belongings or valuables.  Contacts, dentures or bridgework may not be worn into surgery.  Leave your suitcase in the car.  After surgery it may be brought to your room.  For patients admitted to the hospital, discharge time will be determined by your treatment team.  Patients discharged the day of surgery will not be allowed to drive home.    [place]- Preparing For Surgery  Before surgery, you can play an important role. Because skin is not sterile, your skin needs to be as free of germs as possible. You can reduce the number of germs on your skin by washing with CHG (chlorahexidine gluconate) Soap before surgery.  CHG is an antiseptic cleaner which kills germs and bonds with the skin to continue killing germs even after washing.  Please do not use if you have an allergy to CHG or antibacterial soaps. If your skin becomes reddened/irritated stop using the CHG.  Do not shave (including legs and underarms) for at least 48 hours  prior to first CHG shower. It is OK to shave your face.  Please follow these instructions carefully.   1. Shower the NIGHT BEFORE SURGERY and the MORNING OF SURGERY with CHG.   2. If you chose to wash your hair, wash your hair first as usual with your normal shampoo.  3. After you shampoo, rinse your hair and body thoroughly to remove the shampoo.  4. Use CHG as you would any other liquid soap. You can apply CHG directly to the skin and wash gently with a scrungie or a clean washcloth.   5. Apply the CHG Soap to your body ONLY FROM THE NECK DOWN.  Do not use on open wounds or open sores. Avoid contact with your eyes, ears, mouth and genitals (private parts). Wash Face and genitals (private parts)  with your normal soap.  6. Wash thoroughly, paying special attention to the area where your surgery will be performed.  7. Thoroughly rinse your body with warm water from the neck down.  8. DO NOT shower/wash with your normal soap after using and rinsing off the CHG Soap.  9. Pat yourself dry with a CLEAN TOWEL.  10. Wear CLEAN PAJAMAS to bed the night before surgery, wear comfortable clothes the morning of surgery  11. Place CLEAN SHEETS on your bed the night of your first shower and DO NOT SLEEP WITH PETS.    Day of Surgery: Do not apply any deodorants/lotions. Please wear clean  clothes to the hospital/surgery center.      Please read over the  fact sheets that you were given.

## 2017-04-02 NOTE — Pre-Procedure Instructions (Signed)
Tamara Fleming  04/02/2017      [place], [place] [address] Phone: [phone] Fax: [phone]    Your procedure is scheduled on April 13, 2017.  Report to [facility] Admitting at 1000 A.M.  Call this number if you have problems the morning of surgery:  [phone]   Remember:  Do not eat food or drink liquids after midnight.  Take these medicines the morning of surgery with A SIP OF WATER Hydrocodone-acetaminophen (Norco/Vicodin) if needed.  7 days prior to surgery 04/06/17 STOP taking any Aspirin (unless otherwise instructed by your surgeon), Aleve, Naproxen, Ibuprofen, Motrin, Advil, Goody's, BC's, all herbal medications, fish oil, and all vitamins   Do not wear jewelry, make-up or nail polish.  Do not wear lotions, powders, or perfumes, or deodorant.  Do not shave 48 hours prior to surgery.   Do not bring valuables to the hospital.  [facility] is not responsible for any belongings or valuables.  Contacts, dentures or bridgework may not be worn into surgery.  Leave your suitcase in the car.  After surgery it may be brought to your room.  For patients admitted to the hospital, discharge time will be determined by your treatment team.  Patients discharged the day of surgery will not be allowed to drive home.    [place]- Preparing For Surgery  Before surgery, you can play an important role. Because skin is not sterile, your skin needs to be as free of germs as possible. You can reduce the number of germs on your skin by washing with CHG (chlorahexidine gluconate) Soap before surgery.  CHG is an antiseptic cleaner which kills germs and bonds with the skin to continue killing germs even after washing.  Please do not use if you have an allergy to CHG or antibacterial soaps. If your skin becomes reddened/irritated stop using the CHG.  Do not shave (including legs and underarms) for at least 48 hours  prior to first CHG shower. It is OK to shave your face.  Please follow these instructions carefully.   1. Shower the NIGHT BEFORE SURGERY and the MORNING OF SURGERY with CHG.   2. If you chose to wash your hair, wash your hair first as usual with your normal shampoo.  3. After you shampoo, rinse your hair and body thoroughly to remove the shampoo.  4. Use CHG as you would any other liquid soap. You can apply CHG directly to the skin and wash gently with a scrungie or a clean washcloth.   5. Apply the CHG Soap to your body ONLY FROM THE NECK DOWN.  Do not use on open wounds or open sores. Avoid contact with your eyes, ears, mouth and genitals (private parts). Wash Face and genitals (private parts)  with your normal soap.  6. Wash thoroughly, paying special attention to the area where your surgery will be performed.  7. Thoroughly rinse your body with warm water from the neck down.  8. DO NOT shower/wash with your normal soap after using and rinsing off the CHG Soap.  9. Pat yourself dry with a CLEAN TOWEL.  10. Wear CLEAN PAJAMAS to bed the night before surgery, wear comfortable clothes the morning of surgery  11. Place CLEAN SHEETS on your bed the night of your first shower and DO NOT SLEEP WITH PETS.    Day of Surgery: Do not apply any deodorants/lotions. Please wear clean  clothes to the hospital/surgery center.      Please read over the  fact sheets that you were given.

## 2017-04-05 NOTE — Progress Notes (Signed)
Anesthesia Chart Review:  Pt is a 71 year old female scheduled for L total hip arthroplasty anterior approach on 04/13/2017 with Edmonia Lynch, MD  - PCP is Kathrine Haddock, NP - Cardiologist is Isaias Cowman, MD. Last office 10/20/16  PMH includes:  HTN, heart murmur, hyperlipidemia, GERD. Never smoker. BMI 29.  Anesthesia history: pt reports she has a small mouth but has not had any issues with anesthesia/intubation.  - For EGD 02/04/15 (in Epic), pre-op assessment documents "Mallampati IV, TM distance >3 FB, full neck ROM, and limited mouth opening".  - Records from breast reduction surgery 06/11/15 at [facility] reviewed but are virtually illegible. On paper chart for review.    Medications include: ASA 81 mg, Lipitor, Lasix, losartan-HCTZ  BP (!) 154/72   Pulse 68   Temp 36.6 C (Oral)   Resp 18   Ht 5\' 5"  (1.651 m)   Wt 172 lb 9.9 oz (78.3 kg)   LMP  (LMP Unknown)   SpO2 100%   BMI 28.73 kg/m   Preoperative labs reviewed.    EKG 04/02/17: NSR  Nuclear stress test 12/21/14:  1. Normal left ventricular function 2. Normal wall motion 3. No evidence for scar or ischemia  Echo 12/21/14:  1. Normal LV systolic function with an estimated EF >55%. 2. Normal RV systolic function. 3. Mild tricuspid and mitral valve insufficiency. 4. Trace aortic valve insufficiency. 5. No valvular stenosis. 6. Mild LA enlargement  If no changes, I anticipate pt can proceed with surgery as scheduled.   Willeen Cass, FNP-BC [facility] Short Stay Surgical Center/Anesthesiology Phone: [phone] 04/05/2017 1:00 PM

## 2017-04-12 MED ORDER — TRANEXAMIC ACID 1000 MG/10ML IV SOLN
1000.0000 mg | INTRAVENOUS | Status: DC
  Filled 2017-04-12: qty 10

## 2017-04-12 MED ORDER — TRANEXAMIC ACID 1000 MG/10ML IV SOLN
2000.0000 mg | INTRAVENOUS | Status: DC
  Filled 2017-04-12: qty 20

## 2017-04-13 ENCOUNTER — Inpatient Hospital Stay (HOSPITAL_COMMUNITY): Payer: Medicare Other | Admitting: Emergency Medicine

## 2017-04-13 ENCOUNTER — Inpatient Hospital Stay (HOSPITAL_COMMUNITY): Payer: Medicare Other

## 2017-04-13 ENCOUNTER — Encounter (HOSPITAL_COMMUNITY): Payer: Self-pay | Admitting: General Practice

## 2017-04-13 ENCOUNTER — Encounter (HOSPITAL_COMMUNITY)
Admission: RE | Disposition: A | Discharge status: Home Health | Payer: Self-pay | Source: Ambulatory Visit | Attending: Orthopedic Surgery

## 2017-04-13 ENCOUNTER — Inpatient Hospital Stay (HOSPITAL_COMMUNITY)
Admission: RE | Admit: 2017-04-13 | Discharge: 2017-04-15 | DRG: 470 | Disposition: A | Discharge status: Home Health | Payer: Medicare Other | Source: Ambulatory Visit | Attending: Orthopedic Surgery | Admitting: Orthopedic Surgery

## 2017-04-13 ENCOUNTER — Inpatient Hospital Stay (HOSPITAL_COMMUNITY): Payer: Medicare Other | Admitting: Anesthesiology

## 2017-04-13 DIAGNOSIS — Z96642 Presence of left artificial hip joint: Secondary | ICD-10-CM | POA: Diagnosis not present

## 2017-04-13 DIAGNOSIS — Z419 Encounter for procedure for purposes other than remedying health state, unspecified: Secondary | ICD-10-CM

## 2017-04-13 DIAGNOSIS — M549 Dorsalgia, unspecified: Secondary | ICD-10-CM | POA: Diagnosis present

## 2017-04-13 DIAGNOSIS — G709 Myoneural disorder, unspecified: Secondary | ICD-10-CM | POA: Diagnosis not present

## 2017-04-13 DIAGNOSIS — Z792 Long term (current) use of antibiotics: Secondary | ICD-10-CM

## 2017-04-13 DIAGNOSIS — I10 Essential (primary) hypertension: Secondary | ICD-10-CM | POA: Diagnosis present

## 2017-04-13 DIAGNOSIS — Z7982 Long term (current) use of aspirin: Secondary | ICD-10-CM | POA: Diagnosis not present

## 2017-04-13 DIAGNOSIS — G8929 Other chronic pain: Secondary | ICD-10-CM | POA: Diagnosis present

## 2017-04-13 DIAGNOSIS — Z881 Allergy status to other antibiotic agents status: Secondary | ICD-10-CM | POA: Diagnosis not present

## 2017-04-13 DIAGNOSIS — M1612 Unilateral primary osteoarthritis, left hip: Secondary | ICD-10-CM | POA: Diagnosis not present

## 2017-04-13 DIAGNOSIS — Z471 Aftercare following joint replacement surgery: Secondary | ICD-10-CM | POA: Diagnosis not present

## 2017-04-13 DIAGNOSIS — F419 Anxiety disorder, unspecified: Secondary | ICD-10-CM

## 2017-04-13 DIAGNOSIS — Z882 Allergy status to sulfonamides status: Secondary | ICD-10-CM | POA: Diagnosis not present

## 2017-04-13 DIAGNOSIS — I739 Peripheral vascular disease, unspecified: Secondary | ICD-10-CM | POA: Diagnosis not present

## 2017-04-13 DIAGNOSIS — G5601 Carpal tunnel syndrome, right upper limb: Secondary | ICD-10-CM | POA: Diagnosis not present

## 2017-04-13 DIAGNOSIS — E7800 Pure hypercholesterolemia, unspecified: Secondary | ICD-10-CM | POA: Diagnosis not present

## 2017-04-13 DIAGNOSIS — E785 Hyperlipidemia, unspecified: Secondary | ICD-10-CM | POA: Diagnosis present

## 2017-04-13 DIAGNOSIS — Z79891 Long term (current) use of opiate analgesic: Secondary | ICD-10-CM

## 2017-04-13 DIAGNOSIS — Z9189 Other specified personal risk factors, not elsewhere classified: Secondary | ICD-10-CM

## 2017-04-13 DIAGNOSIS — Z79899 Other long term (current) drug therapy: Secondary | ICD-10-CM

## 2017-04-13 HISTORY — PX: TOTAL HIP ARTHROPLASTY: SHX124

## 2017-04-13 SURGERY — ARTHROPLASTY, HIP, TOTAL, ANTERIOR APPROACH
Anesthesia: Spinal | Site: Hip | Laterality: Left

## 2017-04-13 MED ORDER — FENTANYL CITRATE (PF) 250 MCG/5ML IJ SOLN
INTRAMUSCULAR | Status: DC | PRN
  Administered 2017-04-13: 50 ug via INTRAVENOUS

## 2017-04-13 MED ORDER — HYDROCODONE-ACETAMINOPHEN 5-325 MG PO TABS: 1.0000 | ORAL_TABLET | ORAL | Status: DC | PRN

## 2017-04-13 MED ORDER — CHLORHEXIDINE GLUCONATE 4 % EX LIQD: 60.0000 mL | Freq: Once | CUTANEOUS | Status: DC

## 2017-04-13 MED ORDER — DOCUSATE SODIUM 100 MG PO CAPS
100.0000 mg | ORAL_CAPSULE | Freq: Two times a day (BID) | ORAL | Status: DC
  Administered 2017-04-13 – 2017-04-15 (×4): 100 mg via ORAL
  Filled 2017-04-13 (×4): qty 1

## 2017-04-13 MED ORDER — HYDROCHLOROTHIAZIDE 12.5 MG PO CAPS
12.5000 mg | ORAL_CAPSULE | Freq: Every day | ORAL | Status: DC
  Administered 2017-04-14: 12.5 mg via ORAL
  Filled 2017-04-13 (×2): qty 1

## 2017-04-13 MED ORDER — FENTANYL CITRATE (PF) 250 MCG/5ML IJ SOLN
INTRAMUSCULAR | Status: AC
  Filled 2017-04-13: qty 5

## 2017-04-13 MED ORDER — OMEPRAZOLE 20 MG PO CPDR: 20.0000 mg | DELAYED_RELEASE_CAPSULE | Freq: Every day | ORAL | 0 refills | Status: DC

## 2017-04-13 MED ORDER — SORBITOL 70 % SOLN: 30.0000 mL | Freq: Every day | Status: DC | PRN

## 2017-04-13 MED ORDER — LOSARTAN POTASSIUM 50 MG PO TABS
100.0000 mg | ORAL_TABLET | Freq: Every day | ORAL | Status: DC
  Administered 2017-04-14: 100 mg via ORAL
  Filled 2017-04-13 (×2): qty 2

## 2017-04-13 MED ORDER — POLYETHYLENE GLYCOL 3350 17 G PO PACK: 17.0000 g | PACK | Freq: Every day | ORAL | Status: DC | PRN

## 2017-04-13 MED ORDER — KETOROLAC TROMETHAMINE 15 MG/ML IJ SOLN
INTRAMUSCULAR | Status: AC
  Filled 2017-04-13: qty 1

## 2017-04-13 MED ORDER — ASPIRIN EC 325 MG PO TBEC
325.0000 mg | DELAYED_RELEASE_TABLET | Freq: Every day | ORAL | Status: DC
  Administered 2017-04-14 – 2017-04-15 (×2): 325 mg via ORAL
  Filled 2017-04-13: qty 1

## 2017-04-13 MED ORDER — METOCLOPRAMIDE HCL 5 MG/ML IJ SOLN: 5.0000 mg | Freq: Three times a day (TID) | INTRAMUSCULAR | Status: DC | PRN

## 2017-04-13 MED ORDER — ACETAMINOPHEN 325 MG PO TABS: 650.0000 mg | ORAL_TABLET | ORAL | Status: DC | PRN

## 2017-04-13 MED ORDER — KETOROLAC TROMETHAMINE 15 MG/ML IJ SOLN
7.5000 mg | Freq: Four times a day (QID) | INTRAMUSCULAR | Status: DC
  Administered 2017-04-13 (×2): 7.5 mg via INTRAVENOUS
  Filled 2017-04-13 (×2): qty 1

## 2017-04-13 MED ORDER — TRANEXAMIC ACID 1000 MG/10ML IV SOLN
INTRAVENOUS | Status: AC | PRN
  Administered 2017-04-13: 2000 mg via TOPICAL

## 2017-04-13 MED ORDER — MIDAZOLAM HCL 2 MG/2ML IJ SOLN
INTRAMUSCULAR | Status: AC
  Filled 2017-04-13: qty 2

## 2017-04-13 MED ORDER — LIDOCAINE HCL (CARDIAC) 20 MG/ML IV SOLN
INTRAVENOUS | Status: DC | PRN
  Administered 2017-04-13: 50 mg via INTRATRACHEAL

## 2017-04-13 MED ORDER — PHENOL 1.4 % MT LIQD: 1.0000 | OROMUCOSAL | Status: DC | PRN

## 2017-04-13 MED ORDER — ONDANSETRON HCL 4 MG PO TABS: 4.0000 mg | ORAL_TABLET | Freq: Three times a day (TID) | ORAL | 0 refills | Status: DC | PRN

## 2017-04-13 MED ORDER — METHOCARBAMOL 500 MG PO TABS
500.0000 mg | ORAL_TABLET | Freq: Four times a day (QID) | ORAL | Status: DC | PRN
  Administered 2017-04-13: 500 mg via ORAL
  Filled 2017-04-13: qty 1

## 2017-04-13 MED ORDER — FENTANYL CITRATE (PF) 100 MCG/2ML IJ SOLN
INTRAMUSCULAR | Status: AC
  Filled 2017-04-13: qty 2

## 2017-04-13 MED ORDER — DEXAMETHASONE SODIUM PHOSPHATE 10 MG/ML IJ SOLN
10.0000 mg | Freq: Once | INTRAMUSCULAR | Status: DC
  Filled 2017-04-13: qty 1

## 2017-04-13 MED ORDER — ONDANSETRON HCL 4 MG/2ML IJ SOLN: 4.0000 mg | Freq: Four times a day (QID) | INTRAMUSCULAR | Status: DC | PRN

## 2017-04-13 MED ORDER — FLEET ENEMA 7-19 GM/118ML RE ENEM: 1.0000 | ENEMA | Freq: Once | RECTAL | Status: DC | PRN

## 2017-04-13 MED ORDER — SUGAMMADEX SODIUM 200 MG/2ML IV SOLN
INTRAVENOUS | Status: AC
  Filled 2017-04-13: qty 4

## 2017-04-13 MED ORDER — METHOCARBAMOL 1000 MG/10ML IJ SOLN
500.0000 mg | Freq: Four times a day (QID) | INTRAVENOUS | Status: DC | PRN
  Administered 2017-04-13: 500 mg via INTRAVENOUS
  Filled 2017-04-13 (×2): qty 5

## 2017-04-13 MED ORDER — MIDAZOLAM HCL 2 MG/2ML IJ SOLN
INTRAMUSCULAR | Status: AC
  Administered 2017-04-13: 0.5 mg
  Filled 2017-04-13: qty 2

## 2017-04-13 MED ORDER — ATORVASTATIN CALCIUM 10 MG PO TABS
10.0000 mg | ORAL_TABLET | Freq: Every day | ORAL | Status: DC
  Administered 2017-04-13 – 2017-04-14 (×2): 10 mg via ORAL
  Filled 2017-04-13 (×2): qty 1

## 2017-04-13 MED ORDER — SODIUM CHLORIDE FLUSH 0.9 % IV SOLN
INTRAVENOUS | Status: DC | PRN
  Administered 2017-04-13: 30 mL
  Administered 2017-04-13: 20 mL

## 2017-04-13 MED ORDER — ASPIRIN EC 325 MG PO TBEC: 325.0000 mg | DELAYED_RELEASE_TABLET | Freq: Every day | ORAL | 0 refills | Status: DC

## 2017-04-13 MED ORDER — 0.9 % SODIUM CHLORIDE (POUR BTL) OPTIME
TOPICAL | Status: DC | PRN
  Administered 2017-04-13: 1000 mL

## 2017-04-13 MED ORDER — LACTATED RINGERS IV SOLN
INTRAVENOUS | Status: DC
Start: 2017-04-13 — End: 2017-04-13
  Administered 2017-04-13: 10:00:00 via INTRAVENOUS

## 2017-04-13 MED ORDER — DOCUSATE SODIUM 100 MG PO CAPS: 100.0000 mg | ORAL_CAPSULE | Freq: Two times a day (BID) | ORAL | 0 refills | Status: DC

## 2017-04-13 MED ORDER — GABAPENTIN 300 MG PO CAPS
300.0000 mg | ORAL_CAPSULE | Freq: Once | ORAL | Status: AC
  Administered 2017-04-13: 300 mg via ORAL
  Filled 2017-04-13: qty 1

## 2017-04-13 MED ORDER — PROPOFOL 10 MG/ML IV BOLUS
INTRAVENOUS | Status: AC
  Filled 2017-04-13: qty 20

## 2017-04-13 MED ORDER — FENTANYL CITRATE (PF) 100 MCG/2ML IJ SOLN
25.0000 ug | INTRAMUSCULAR | Status: DC | PRN
  Administered 2017-04-13 (×3): 50 ug via INTRAVENOUS

## 2017-04-13 MED ORDER — MIDAZOLAM HCL 2 MG/2ML IJ SOLN
0.5000 mg | Freq: Once | INTRAMUSCULAR | Status: AC
  Administered 2017-04-13: 0.5 mg via INTRAVENOUS

## 2017-04-13 MED ORDER — OXYCODONE HCL 5 MG PO TABS
5.0000 mg | ORAL_TABLET | Freq: Once | ORAL | Status: AC | PRN
  Administered 2017-04-13: 5 mg via ORAL

## 2017-04-13 MED ORDER — MENTHOL 3 MG MT LOZG: 1.0000 | LOZENGE | OROMUCOSAL | Status: DC | PRN

## 2017-04-13 MED ORDER — TRANEXAMIC ACID 1000 MG/10ML IV SOLN
2000.0000 mg | Freq: Once | INTRAVENOUS | Status: DC
  Filled 2017-04-13: qty 20

## 2017-04-13 MED ORDER — FENTANYL CITRATE (PF) 100 MCG/2ML IJ SOLN
50.0000 ug | Freq: Once | INTRAMUSCULAR | Status: AC
  Administered 2017-04-13: 50 ug via INTRAVENOUS

## 2017-04-13 MED ORDER — HYDROCODONE-ACETAMINOPHEN 5-325 MG PO TABS
2.0000 | ORAL_TABLET | ORAL | Status: DC | PRN
  Administered 2017-04-13 – 2017-04-14 (×3): 2 via ORAL
  Filled 2017-04-13 (×3): qty 2

## 2017-04-13 MED ORDER — LACTATED RINGERS IV SOLN
INTRAVENOUS | Status: DC
  Administered 2017-04-13 (×2): via INTRAVENOUS

## 2017-04-13 MED ORDER — HYDROCODONE-ACETAMINOPHEN 5-325 MG PO TABS: 1.0000 | ORAL_TABLET | ORAL | 0 refills | Status: DC | PRN

## 2017-04-13 MED ORDER — METOCLOPRAMIDE HCL 5 MG PO TABS: 5.0000 mg | ORAL_TABLET | Freq: Three times a day (TID) | ORAL | Status: DC | PRN

## 2017-04-13 MED ORDER — OXYCODONE HCL 5 MG/5ML PO SOLN: 5.0000 mg | Freq: Once | ORAL | Status: AC | PRN

## 2017-04-13 MED ORDER — CEFAZOLIN SODIUM-DEXTROSE 1-4 GM/50ML-% IV SOLN
1.0000 g | Freq: Four times a day (QID) | INTRAVENOUS | Status: AC
  Administered 2017-04-13 – 2017-04-14 (×2): 1 g via INTRAVENOUS
  Filled 2017-04-13 (×2): qty 50

## 2017-04-13 MED ORDER — PROPOFOL 1000 MG/100ML IV EMUL
INTRAVENOUS | Status: AC
  Filled 2017-04-13: qty 100

## 2017-04-13 MED ORDER — LOSARTAN POTASSIUM-HCTZ 100-12.5 MG PO TABS: 1.0000 | ORAL_TABLET | Freq: Every day | ORAL | Status: DC

## 2017-04-13 MED ORDER — CEFAZOLIN SODIUM-DEXTROSE 2-4 GM/100ML-% IV SOLN
2.0000 g | INTRAVENOUS | Status: AC
  Administered 2017-04-13: 2 g via INTRAVENOUS
  Filled 2017-04-13: qty 100

## 2017-04-13 MED ORDER — ONDANSETRON HCL 4 MG PO TABS: 4.0000 mg | ORAL_TABLET | Freq: Four times a day (QID) | ORAL | Status: DC | PRN

## 2017-04-13 MED ORDER — ACETAMINOPHEN 500 MG PO TABS
1000.0000 mg | ORAL_TABLET | Freq: Once | ORAL | Status: AC
  Administered 2017-04-13: 1000 mg via ORAL
  Filled 2017-04-13: qty 2

## 2017-04-13 MED ORDER — BACLOFEN 10 MG PO TABS: 10.0000 mg | ORAL_TABLET | Freq: Three times a day (TID) | ORAL | 0 refills | Status: DC | PRN

## 2017-04-13 MED ORDER — PROPOFOL 500 MG/50ML IV EMUL
INTRAVENOUS | Status: DC | PRN
  Administered 2017-04-13: 75 ug/kg/min via INTRAVENOUS

## 2017-04-13 MED ORDER — ACETAMINOPHEN 650 MG RE SUPP: 650.0000 mg | RECTAL | Status: DC | PRN

## 2017-04-13 MED ORDER — SENNA 8.6 MG PO TABS
1.0000 | ORAL_TABLET | Freq: Two times a day (BID) | ORAL | Status: DC
  Administered 2017-04-14 – 2017-04-15 (×3): 8.6 mg via ORAL
  Filled 2017-04-13 (×3): qty 1

## 2017-04-13 MED ORDER — TRANEXAMIC ACID 1000 MG/10ML IV SOLN
1000.0000 mg | INTRAVENOUS | Status: AC
  Administered 2017-04-13: 1000 mg via INTRAVENOUS
  Filled 2017-04-13: qty 10

## 2017-04-13 MED ORDER — BUPIVACAINE-EPINEPHRINE (PF) 0.25% -1:200000 IJ SOLN
INTRAMUSCULAR | Status: AC
  Filled 2017-04-13: qty 30

## 2017-04-13 MED ORDER — GLYCOPYRROLATE 0.2 MG/ML IJ SOLN
INTRAMUSCULAR | Status: DC | PRN
  Administered 2017-04-13: 0.2 mg via INTRAVENOUS

## 2017-04-13 MED ORDER — DIPHENHYDRAMINE HCL 50 MG/ML IJ SOLN
INTRAMUSCULAR | Status: DC | PRN
Start: 2017-04-13 — End: 2017-04-13
  Administered 2017-04-13: 25 mg via INTRAVENOUS

## 2017-04-13 MED ORDER — DIPHENHYDRAMINE HCL 12.5 MG/5ML PO ELIX: 12.5000 mg | ORAL_SOLUTION | ORAL | Status: DC | PRN

## 2017-04-13 MED ORDER — BUPIVACAINE IN DEXTROSE 0.75-8.25 % IT SOLN
INTRATHECAL | Status: DC | PRN
  Administered 2017-04-13: 15 mg via INTRATHECAL

## 2017-04-13 MED ORDER — PHENYLEPHRINE HCL 10 MG/ML IJ SOLN
INTRAMUSCULAR | Status: DC | PRN
  Administered 2017-04-13: 30 ug/min via INTRAVENOUS

## 2017-04-13 MED ORDER — METHOCARBAMOL 500 MG PO TABS
ORAL_TABLET | ORAL | Status: AC
  Filled 2017-04-13: qty 1

## 2017-04-13 MED ORDER — OXYCODONE HCL 5 MG PO TABS
ORAL_TABLET | ORAL | Status: AC
  Filled 2017-04-13: qty 1

## 2017-04-13 MED ORDER — BUPIVACAINE LIPOSOME 1.3 % IJ SUSP
20.0000 mL | Freq: Once | INTRAMUSCULAR | Status: DC
  Filled 2017-04-13: qty 20

## 2017-04-13 MED ORDER — FENTANYL CITRATE (PF) 100 MCG/2ML IJ SOLN
INTRAMUSCULAR | Status: AC
  Administered 2017-04-13: 50 ug via INTRAVENOUS
  Filled 2017-04-13: qty 2

## 2017-04-13 MED ORDER — LACTATED RINGERS IV SOLN
INTRAVENOUS | Status: DC
  Administered 2017-04-13 (×2): via INTRAVENOUS

## 2017-04-13 MED ORDER — BUPIVACAINE LIPOSOME 1.3 % IJ SUSP
INTRAMUSCULAR | Status: DC | PRN
  Administered 2017-04-13: 20 mL

## 2017-04-13 MED ORDER — MIDAZOLAM HCL 5 MG/5ML IJ SOLN
INTRAMUSCULAR | Status: DC | PRN
  Administered 2017-04-13: 2 mg via INTRAVENOUS

## 2017-04-13 SURGICAL SUPPLY — 52 items
BAG DECANTER FOR FLEXI CONT (MISCELLANEOUS) IMPLANT
BLADE SAG 18X100X1.27 (BLADE) IMPLANT
CAPT HIP TOTAL 3 ×2 IMPLANT
CLOSURE STERI-STRIP 1/2X4 (GAUZE/BANDAGES/DRESSINGS) ×1
CLSR STERI-STRIP ANTIMIC 1/2X4 (GAUZE/BANDAGES/DRESSINGS) ×2 IMPLANT
COVER PERINEAL POST (MISCELLANEOUS) ×3 IMPLANT
COVER SURGICAL LIGHT HANDLE (MISCELLANEOUS) ×3 IMPLANT
DRAPE C-ARM 42X72 X-RAY (DRAPES) ×3 IMPLANT
DRAPE STERI IOBAN 125X83 (DRAPES) ×3 IMPLANT
DRAPE U-SHAPE 47X51 STRL (DRAPES) IMPLANT
DRSG MEPILEX BORDER 4X8 (GAUZE/BANDAGES/DRESSINGS) ×3 IMPLANT
DURAPREP 26ML APPLICATOR (WOUND CARE) ×3 IMPLANT
ELECT BLADE 4.0 EZ CLEAN MEGAD (MISCELLANEOUS) ×3
ELECT REM PT RETURN 9FT ADLT (ELECTROSURGICAL) ×3
ELECTRODE BLDE 4.0 EZ CLN MEGD (MISCELLANEOUS) ×1 IMPLANT
ELECTRODE REM PT RTRN 9FT ADLT (ELECTROSURGICAL) ×1 IMPLANT
FACESHIELD WRAPAROUND (MASK) ×6 IMPLANT
FACESHIELD WRAPAROUND OR TEAM (MASK) ×2 IMPLANT
GLOVE BIO SURGEON STRL SZ7.5 (GLOVE) ×6 IMPLANT
GLOVE BIOGEL PI IND STRL 8 (GLOVE) ×2 IMPLANT
GLOVE BIOGEL PI INDICATOR 8 (GLOVE) ×4
GOWN STRL REUS W/ TWL LRG LVL3 (GOWN DISPOSABLE) ×2 IMPLANT
GOWN STRL REUS W/TWL LRG LVL3 (GOWN DISPOSABLE) ×6
KIT BASIN OR (CUSTOM PROCEDURE TRAY) ×3 IMPLANT
KIT ROOM TURNOVER OR (KITS) ×3 IMPLANT
MANIFOLD NEPTUNE II (INSTRUMENTS) ×3 IMPLANT
NDL 18GX1X1/2 (RX/OR ONLY) (NEEDLE) IMPLANT
NDL SAFETY ECLIPSE 18X1.5 (NEEDLE) IMPLANT
NEEDLE 18GX1X1/2 (RX/OR ONLY) (NEEDLE) ×3 IMPLANT
NEEDLE HYPO 18GX1.5 SHARP (NEEDLE)
NEEDLE HYPO 22GX1.5 SAFETY (NEEDLE) ×3 IMPLANT
NS IRRIG 1000ML POUR BTL (IV SOLUTION) ×3 IMPLANT
PACK TOTAL JOINT (CUSTOM PROCEDURE TRAY) ×3 IMPLANT
PAD ARMBOARD 7.5X6 YLW CONV (MISCELLANEOUS) ×3 IMPLANT
SPONGE LAP 18X18 X RAY DECT (DISPOSABLE) IMPLANT
SUT MNCRL AB 4-0 PS2 18 (SUTURE) ×3 IMPLANT
SUT MON AB 2-0 CT1 36 (SUTURE) ×3 IMPLANT
SUT VIC AB 0 CT1 27 (SUTURE) ×3
SUT VIC AB 0 CT1 27XBRD ANBCTR (SUTURE) ×1 IMPLANT
SUT VIC AB 1 CT1 27 (SUTURE) ×3
SUT VIC AB 1 CT1 27XBRD ANBCTR (SUTURE) ×1 IMPLANT
SUT VLOC 180 0 24IN GS25 (SUTURE) IMPLANT
SYR 50ML LL SCALE MARK (SYRINGE) ×5 IMPLANT
SYR BULB IRRIGATION 50ML (SYRINGE) ×5 IMPLANT
SYRINGE 20CC LL (MISCELLANEOUS) IMPLANT
SYRINGE 60CC LL (MISCELLANEOUS) ×2 IMPLANT
TOWEL OR 17X24 6PK STRL BLUE (TOWEL DISPOSABLE) ×3 IMPLANT
TOWEL OR 17X26 10 PK STRL BLUE (TOWEL DISPOSABLE) ×3 IMPLANT
TRAY CATH 16FR W/PLASTIC CATH (SET/KITS/TRAYS/PACK) IMPLANT
TRAY FOLEY W/METER SILVER 16FR (SET/KITS/TRAYS/PACK) IMPLANT
WATER STERILE IRR 1000ML POUR (IV SOLUTION) ×3 IMPLANT
YANKAUER SUCT BULB TIP NO VENT (SUCTIONS) ×6 IMPLANT

## 2017-04-13 NOTE — Discharge Instructions (Signed)

## 2017-04-13 NOTE — Progress Notes (Signed)
Pt expressing "spinal is scary". Frequent explanation & reassurance provided, but she is very tense. Request med for anxiety. Dr Marcie Bal updated & new order for IV Versed, Will continue to monitor.

## 2017-04-13 NOTE — Op Note (Signed)
04/13/2017  2:51 PM  PATIENT:  Tamara Fleming   MRN: [id number]  PRE-OPERATIVE DIAGNOSIS:  OsteoArthritis LEFT HIP  POST-OPERATIVE DIAGNOSIS:  OsteoArthritis LEFT HIP  PROCEDURE:  Procedure(s): TOTAL LEFT HIP ARTHROPLASTY ANTERIOR APPROACH  PREOPERATIVE INDICATIONS:    Tamara Fleming is an 72 y.o. female who has a diagnosis of Primary osteoarthritis of left hip and elected for surgical management after failing conservative treatment.  The risks benefits and alternatives were discussed with the patient including but not limited to the risks of nonoperative treatment, versus surgical intervention including infection, bleeding, nerve injury, periprosthetic fracture, the need for revision surgery, dislocation, leg length discrepancy, blood clots, cardiopulmonary complications, morbidity, mortality, among others, and they were willing to proceed.     OPERATIVE REPORT     SURGEON:   Tarra Pence, Ernesta Amble, MD    ASSISTANT:  Roxan Hockey, PA-C, he was present and scrubbed throughout the case, critical for completion in a timely fashion, and for retraction, instrumentation, and closure.     ANESTHESIA:  General    COMPLICATIONS:  None.     COMPONENTS:  Stryker acolade fit femur size 3 with a 36 mm +5 head ball and a PSL acetabular shell size 50 with a  polyethylene liner    PROCEDURE IN DETAIL:   The patient was met in the holding area and  identified.  The appropriate hip was identified and marked at the operative site.  The patient was then transported to the OR  and  placed under anesthesia per that record.  At that point, the patient was  placed in the supine position and  secured to the operating room table and all bony prominences padded. He received pre-operative antibiotics    The operative lower extremity was prepped from the iliac crest to the distal leg.  Sterile draping was performed.  Time out was performed prior to incision.      Skin incision was made just 2 cm lateral to  the ASIS  extending in line with the tensor fascia lata. Electrocautery was used to control all bleeders. I dissected down sharply to the fascia of the tensor fascia lata was confirmed that the muscle fibers beneath were running posteriorly. I then incised the fascia over the superficial tensor fascia lata in line with the incision. The fascia was elevated off the anterior aspect of the muscle the muscle was retracted posteriorly and protected throughout the case. I then used electrocautery to incise the tensor fascia lata fascia control and all bleeders. Immediately visible was the fat over top of the anterior neck and capsule.  I removed the anterior fat from the capsule and elevated the rectus muscle off of the anterior capsule. I then removed a large time of capsule. The retractors were then placed over the anterior acetabulum as well as around the superior and inferior neck.  I then removed a section of the femoral neck and a napkin ring fashion. Then used the power course to remove the femoral head from the acetabulum and thoroughly irrigated the acetabulum. I sized the femoral head.    I then exposed the deep acetabulum, cleared out any tissue including the ligamentum teres.   After adequate visualization, I excised the labrum, and then sequentially reamed.  I then impacted the acetabular implant into place using fluoroscopy for guidance.  Appropriate version and inclination was confirmed clinically matching their bony anatomy, and with fluoroscopy.  I placed a 20 mm screw in the posterior/superio position with an excellent bite.  I then placed the polyethylene liner in place  I then adducted the leg and released the external rotators from the posterior femur allowing it to be easily delivered up lateral and anterior to the acetabulum for preparation of the femoral canal.    I then prepared the proximal femur using the cookie-cutter and then sequentially reamed and broached.  A trial broach,  neck, and head was utilized, and I reduced the hip and used floroscopy to assess the neck length and femoral implant.  I then impacted the femoral prosthesis into place into the appropriate version. The hip was then reduced and fluoroscopy confirmed appropriate position. Leg lengths were restored.  I then irrigated the hip copiously again with, and repaired the fascia with Vicryl, followed by monocryl for the subcutaneous tissue, Monocryl for the skin, Steri-Strips and sterile gauze. The patient was then awakened and returned to PACU in stable and satisfactory condition. There were no complications.  POST OPERATIVE PLAN: WBAT, DVT px: SCD's/TED, ambulation and chemical dvt px  Tamara Arrants, MD Orthopedic Surgeon [phone]     

## 2017-04-13 NOTE — Transfer of Care (Signed)
Immediate Anesthesia Transfer of Care Note  Patient: Tamara Fleming  Procedure(s) Performed: TOTAL LEFT HIP ARTHROPLASTY ANTERIOR APPROACH (Left Hip)  Patient Location: PACU  Anesthesia Type:Spinal  Level of Consciousness: awake, alert  and oriented  Airway & Oxygen Therapy: Patient Spontanous Breathing and Patient connected to nasal cannula oxygen  Post-op Assessment: Report given to RN and Post -op Vital signs reviewed and stable  Post vital signs: Reviewed and stable  Last Vitals:  Vitals:   04/13/17 0947  BP: 138/60  Pulse: 80  Resp: 18  Temp: 36.8 C  SpO2: 98%    Last Pain:  Vitals:   04/13/17 1149  TempSrc:   PainSc: 6       Patients Stated Pain Goal: 3 (67/34/19 3790)  Complications: No apparent anesthesia complications

## 2017-04-13 NOTE — Anesthesia Preprocedure Evaluation (Signed)
Anesthesia Evaluation  Patient identified by MRN, date of birth, ID band Patient awake    Reviewed: Allergy & Precautions, H&P , NPO status , Patient's Chart, lab work & pertinent test results  Airway Mallampati: II   Neck ROM: full    Dental   Pulmonary neg pulmonary ROS,    breath sounds clear to auscultation       Cardiovascular hypertension, + Peripheral Vascular Disease   Rhythm:regular Rate:Normal     Neuro/Psych  Neuromuscular disease    GI/Hepatic hiatal hernia, GERD  ,  Endo/Other    Renal/GU      Musculoskeletal  (+) Arthritis ,   Abdominal   Peds  Hematology   Anesthesia Other Findings   Reproductive/Obstetrics                             Anesthesia Physical Anesthesia Plan  ASA: II  Anesthesia Plan: Spinal   Post-op Pain Management:    Induction: Intravenous  PONV Risk Score and Plan: 2 and Ondansetron, Propofol infusion and Treatment may vary due to age or medical condition  Airway Management Planned: Simple Face Mask  Additional Equipment:   Intra-op Plan:   Post-operative Plan:   Informed Consent: I have reviewed the patients History and Physical, chart, labs and discussed the procedure including the risks, benefits and alternatives for the proposed anesthesia with the patient or authorized representative who has indicated his/her understanding and acceptance.     Plan Discussed with: CRNA, Anesthesiologist and Surgeon  Anesthesia Plan Comments:         Anesthesia Quick Evaluation

## 2017-04-13 NOTE — Progress Notes (Signed)
Pt rates pain 6/10 after meds, Says pain "is tolerable", calmer, VSS.  Dr Tobias Alexander fully updated-pt ok to go to [facility].

## 2017-04-13 NOTE — Anesthesia Procedure Notes (Signed)
Spinal  Patient location during procedure: OR Start time: 04/13/2017 1:22 PM End time: 04/13/2017 1:31 PM Staffing Anesthesiologist: Duane Boston, MD Performed: anesthesiologist  Preanesthetic Checklist Completed: patient identified, surgical consent, pre-op evaluation, timeout performed, IV checked, risks and benefits discussed and monitors and equipment checked Spinal Block Patient position: sitting Prep: DuraPrep Patient monitoring: cardiac monitor, continuous pulse ox and blood pressure Approach: midline Location: L2-3 Injection technique: single-shot Needle Needle type: Pencan  Needle gauge: 24 G Needle length: 9 cm Additional Notes Functioning IV was confirmed and monitors were applied. Sterile prep and drape, including hand hygiene and sterile gloves were used. The patient was positioned and the spine was prepped. The skin was anesthetized with lidocaine.  Free flow of clear CSF was obtained prior to injecting local anesthetic into the CSF.  The spinal needle aspirated freely following injection.  The needle was carefully withdrawn.  The patient tolerated the procedure well.

## 2017-04-13 NOTE — Plan of Care (Signed)
  Nutrition: Adequate nutrition will be maintained 04/13/2017 1846 - Progressing by Williams Che, RN   Coping: Level of anxiety will decrease 04/13/2017 1846 - Progressing by Williams Che, RN   Elimination: Will not experience complications related to bowel motility 04/13/2017 1846 - Progressing by Williams Che, RN   Pain Managment: General experience of comfort will improve 04/13/2017 1846 - Progressing by Williams Che, RN   Safety: Ability to remain free from injury will improve 04/13/2017 1846 - Progressing by Williams Che, RN

## 2017-04-14 ENCOUNTER — Encounter (HOSPITAL_COMMUNITY): Payer: Self-pay

## 2017-04-14 DIAGNOSIS — Z9189 Other specified personal risk factors, not elsewhere classified: Secondary | ICD-10-CM | POA: Diagnosis not present

## 2017-04-14 DIAGNOSIS — G5601 Carpal tunnel syndrome, right upper limb: Secondary | ICD-10-CM | POA: Diagnosis present

## 2017-04-14 DIAGNOSIS — Z79891 Long term (current) use of opiate analgesic: Secondary | ICD-10-CM | POA: Diagnosis not present

## 2017-04-14 DIAGNOSIS — G709 Myoneural disorder, unspecified: Secondary | ICD-10-CM | POA: Diagnosis present

## 2017-04-14 DIAGNOSIS — M549 Dorsalgia, unspecified: Secondary | ICD-10-CM | POA: Diagnosis present

## 2017-04-14 DIAGNOSIS — Z882 Allergy status to sulfonamides status: Secondary | ICD-10-CM | POA: Diagnosis not present

## 2017-04-14 DIAGNOSIS — Z79899 Other long term (current) drug therapy: Secondary | ICD-10-CM | POA: Diagnosis not present

## 2017-04-14 DIAGNOSIS — I739 Peripheral vascular disease, unspecified: Secondary | ICD-10-CM | POA: Diagnosis present

## 2017-04-14 DIAGNOSIS — E785 Hyperlipidemia, unspecified: Secondary | ICD-10-CM | POA: Diagnosis present

## 2017-04-14 DIAGNOSIS — I10 Essential (primary) hypertension: Secondary | ICD-10-CM | POA: Diagnosis present

## 2017-04-14 DIAGNOSIS — M1612 Unilateral primary osteoarthritis, left hip: Secondary | ICD-10-CM | POA: Diagnosis present

## 2017-04-14 DIAGNOSIS — F419 Anxiety disorder, unspecified: Secondary | ICD-10-CM | POA: Diagnosis present

## 2017-04-14 DIAGNOSIS — Z881 Allergy status to other antibiotic agents status: Secondary | ICD-10-CM | POA: Diagnosis not present

## 2017-04-14 DIAGNOSIS — Z7982 Long term (current) use of aspirin: Secondary | ICD-10-CM | POA: Diagnosis not present

## 2017-04-14 DIAGNOSIS — Z792 Long term (current) use of antibiotics: Secondary | ICD-10-CM | POA: Diagnosis not present

## 2017-04-14 DIAGNOSIS — G8929 Other chronic pain: Secondary | ICD-10-CM | POA: Diagnosis present

## 2017-04-14 MED ORDER — METHOCARBAMOL 500 MG PO TABS
500.0000 mg | ORAL_TABLET | Freq: Four times a day (QID) | ORAL | Status: DC
  Administered 2017-04-14 – 2017-04-15 (×6): 500 mg via ORAL
  Filled 2017-04-14 (×6): qty 1

## 2017-04-14 MED ORDER — CELECOXIB 200 MG PO CAPS
200.0000 mg | ORAL_CAPSULE | Freq: Two times a day (BID) | ORAL | Status: DC
  Administered 2017-04-14 – 2017-04-15 (×3): 200 mg via ORAL
  Filled 2017-04-14 (×3): qty 1

## 2017-04-14 MED ORDER — HYDROMORPHONE HCL 1 MG/ML IJ SOLN
1.0000 mg | INTRAMUSCULAR | Status: DC | PRN
  Administered 2017-04-14 – 2017-04-15 (×2): 1 mg via INTRAVENOUS
  Filled 2017-04-14 (×2): qty 1

## 2017-04-14 MED ORDER — OXYCODONE HCL 5 MG PO TABS
5.0000 mg | ORAL_TABLET | ORAL | Status: DC | PRN
  Administered 2017-04-14: 5 mg via ORAL
  Filled 2017-04-14: qty 1

## 2017-04-14 MED ORDER — ACETAMINOPHEN 500 MG PO TABS: 1000.0000 mg | ORAL_TABLET | Freq: Three times a day (TID) | ORAL | 0 refills | Status: AC

## 2017-04-14 MED ORDER — DEXAMETHASONE SODIUM PHOSPHATE 10 MG/ML IJ SOLN
10.0000 mg | Freq: Once | INTRAMUSCULAR | Status: AC
  Administered 2017-04-14: 10 mg via INTRAVENOUS

## 2017-04-14 MED ORDER — HYDROMORPHONE HCL 1 MG/ML IJ SOLN
1.0000 mg | INTRAMUSCULAR | Status: DC | PRN
  Administered 2017-04-14 (×3): 1 mg via INTRAVENOUS
  Filled 2017-04-14 (×3): qty 1

## 2017-04-14 MED ORDER — OXYCODONE HCL 5 MG PO TABS
10.0000 mg | ORAL_TABLET | ORAL | Status: DC | PRN
  Administered 2017-04-15 (×4): 10 mg via ORAL
  Filled 2017-04-14 (×4): qty 2

## 2017-04-14 MED ORDER — METHOCARBAMOL 1000 MG/10ML IJ SOLN
500.0000 mg | Freq: Four times a day (QID) | INTRAVENOUS | Status: DC
  Filled 2017-04-14 (×8): qty 5

## 2017-04-14 MED ORDER — ALPRAZOLAM 0.25 MG PO TABS
0.2500 mg | ORAL_TABLET | Freq: Two times a day (BID) | ORAL | Status: DC | PRN
  Administered 2017-04-15: 0.25 mg via ORAL
  Filled 2017-04-14 (×3): qty 1

## 2017-04-14 MED ORDER — ACETAMINOPHEN 500 MG PO TABS
1000.0000 mg | ORAL_TABLET | Freq: Three times a day (TID) | ORAL | Status: DC
  Administered 2017-04-14 – 2017-04-15 (×5): 1000 mg via ORAL
  Filled 2017-04-14 (×5): qty 2

## 2017-04-14 MED ORDER — OXYCODONE HCL 5 MG PO TABS: 5.0000 mg | ORAL_TABLET | ORAL | 0 refills | Status: AC | PRN

## 2017-04-14 NOTE — Care Management Note (Signed)
Case Management Note  Patient Details  Name: Tamara Fleming MRN: [id number] Date of Birth: December 03, 1945  Subjective/Objective:   72 yr old female s/p left total hip arthroplasty.                 Action/Plan: Patient was preoperatively setup with [facility], no changes. They have all DME. Patient will have family support at discharge.    Expected Discharge Date:   04/15/17               Expected Discharge Plan:  [place]  In-House Referral:  NA  Discharge planning Services  CM Consult  Post Acute Care Choice:  Home Health Choice offered to:  Patient  DME Arranged:  N/A(has RW and 3in1) DME Agency:  NA  HH Arranged:  PT [place] Agency:  [facility] (formerly [employer])  Status of Service:  Completed, signed off  If discussed at [employer], dates discussed:    Additional Comments:  Ninfa Meeker, RN 04/14/2017, 11:51 AM

## 2017-04-14 NOTE — Evaluation (Signed)
Occupational Therapy Evaluation Patient Details Name: Tamara Fleming MRN: [id number] DOB: 15-Apr-1945 Today's Date: 04/14/2017    History of Present Illness 72 y.o. female admitted on 04/13/17 for elective L direct anterior THA.  Pt with significant PMH of PVD, HTN, gout, Chronic low back pain, R carpal tunnel s/p release, L arm surgery (ulnar nerve).     Clinical Impression   PTA, pt was independent with ADL and functional mobility. She currently is limited by L LE pain and decreased activity tolerance for ADL. Pt currently requires max assist for LB ADL and min assist for toilet transfers with 3-in-1 over commode. Initiated education concerning safe use of RW, fall prevention, use of ice for pain management, and compensatory ADL strategies. Pt and husband verbalize understanding and husband is very encouraging to pt. She would benefit from continued OT services while admitted to improve independence and safety with ADL and fucntional mobility prior to returning home with assistance from family. OT will continue to follow while admitted with next session to focus on shower transfers.     Follow Up Recommendations  No OT follow up;Supervision/Assistance - 24 hour    Equipment Recommendations       Recommendations for Other Services       Precautions / Restrictions Precautions Precautions: Fall Restrictions Weight Bearing Restrictions: Yes LLE Weight Bearing: Weight bearing as tolerated      Mobility Bed Mobility Overal bed mobility: Needs Assistance Bed Mobility: Sit to Supine     Supine to sit: Min assist;HOB elevated Sit to supine: Mod assist   General bed mobility comments: Mod assist to manage BLE  Transfers Overall transfer level: Needs assistance Equipment used: Rolling walker (2 wheeled) Transfers: Sit to/from Stand Sit to Stand: Min assist         General transfer comment: Min assist to rise to standing. VC's for hand placement.     Balance Overall balance  assessment: Needs assistance Sitting-balance support: Feet supported;No upper extremity supported Sitting balance-Leahy Scale: Good     Standing balance support: Bilateral upper extremity supported Standing balance-Leahy Scale: Fair                             ADL either performed or assessed with clinical judgement   ADL Overall ADL's : Needs assistance/impaired Eating/Feeding: Set up;Sitting   Grooming: Min guard;Standing   Upper Body Bathing: Set up;Sitting   Lower Body Bathing: Maximal assistance;Sit to/from stand   Upper Body Dressing : Set up;Sitting   Lower Body Dressing: Maximal assistance;Sit to/from stand   Toilet Transfer: Ambulation;Minimal assistance   Toileting- Clothing Manipulation and Hygiene: Supervision/safety;Sitting/lateral lean       Functional mobility during ADLs: Minimal assistance;Rolling walker General ADL Comments: Pt demonstrating decreased functional mobility and is limited by pain.     Vision         Perception     Praxis      Pertinent Vitals/Pain Pain Assessment: Faces Faces Pain Scale: Hurts even more Pain Location: left hip Pain Descriptors / Indicators: Grimacing;Guarding Pain Intervention(s): Monitored during session;Limited activity within patient's tolerance;Repositioned;Ice applied     Hand Dominance Right   Extremity/Trunk Assessment Upper Extremity Assessment Upper Extremity Assessment: Overall WFL for tasks assessed   Lower Extremity Assessment Lower Extremity Assessment: LLE deficits/detail LLE Deficits / Details: Decreased strength and ROM as expected post-operatively.    Cervical / Trunk Assessment Cervical / Trunk Assessment: Kyphotic   Communication Communication Communication: No difficulties  Cognition Arousal/Alertness: Awake/alert Behavior During Therapy: WFL for tasks assessed/performed Overall Cognitive Status: Within Functional Limits for tasks assessed                                      General Comments       Exercises Exercises: Total Joint Total Joint Exercises Ankle Circles/Pumps: AROM;Both;20 reps Quad Sets: AROM;Left;10 reps Heel Slides: AAROM;Left;10 reps   Shoulder Instructions      Home Living Family/patient expects to be discharged to:: Private residence Living Arrangements: (daughter for a few days; then husband) Available Help at Discharge: Family;Available 24 hours/day Type of Home: House Home Access: Stairs to enter [employer] of Steps: 4 Entrance Stairs-Rails: None Home Layout: Multi-level;Full bath on main level;Able to live on main level with bedroom/bathroom     Bathroom Shower/Tub: Occupational psychologist: Handicapped height     Home Equipment: [place] - single point;Shower seat - built in;Walker - 2 wheels;Bedside commode          Prior Functioning/Environment Level of Independence: Independent with assistive device(s)        Comments: used cane to walk PTA, drives, works; modified independent ADL        OT Problem List: Decreased strength;Decreased range of motion;Decreased activity tolerance;Impaired balance (sitting and/or standing);Decreased safety awareness;Decreased knowledge of use of DME or AE;Decreased knowledge of precautions;Pain      OT Treatment/Interventions: Self-care/ADL training;Therapeutic exercise;Energy conservation;DME and/or AE instruction;Therapeutic activities;Visual/perceptual remediation/compensation;Patient/family education    OT Goals(Current goals can be found in the care plan section) Acute Rehab OT Goals Patient Stated Goal: decrease pain and go home tomorrow OT Goal Formulation: With patient/family Time For Goal Achievement: 04/28/17 Potential to Achieve Goals: Good ADL Goals Pt Will Perform Grooming: with supervision;standing Pt Will Perform Lower Body Dressing: with supervision;sit to/from stand Pt Will Transfer to Toilet: with  supervision;ambulating;bedside commode(BSC over toilet) Pt Will Perform Toileting - Clothing Manipulation and hygiene: with supervision;sit to/from stand Pt Will Perform Tub/Shower Transfer: with supervision;rolling walker;3 in 1;shower seat;ambulating;Shower transfer  OT Frequency: Min 2X/week   Barriers to D/C:            Co-evaluation              AM-PAC PT "6 Clicks" Daily Activity     Outcome Measure Help from another person eating meals?: A Little Help from another person taking care of personal grooming?: A Little Help from another person toileting, which includes using toliet, bedpan, or urinal?: A Little Help from another person bathing (including washing, rinsing, drying)?: A Little Help from another person to put on and taking off regular upper body clothing?: A Little Help from another person to put on and taking off regular lower body clothing?: A Little 6 Click Score: 18   End of Session Equipment Utilized During Treatment: Gait belt;Rolling walker Nurse Communication: Mobility status  Activity Tolerance: Patient tolerated treatment well Patient left: in bed;with call bell/phone within reach;with family/visitor present  OT Visit Diagnosis: Other abnormalities of gait and mobility (R26.89);Pain Pain - Right/Left: Left Pain - part of body: Hip                Time: 8250-5397 OT Time Calculation (min): 25 min Charges:  OT General Charges $OT Visit: 1 Visit OT Evaluation $OT Eval Moderate Complexity: 1 Mod OT Treatments $Self Care/Home Management : 8-22 mins G-Codes:     [employer],  MS OTR/L  Pager: [place] 04/14/2017, 2:25 PM

## 2017-04-14 NOTE — Evaluation (Addendum)
Physical Therapy Evaluation Patient Details Name: Tamara Fleming MRN: [id number] DOB: [birth year] Today's Date: 04/14/2017   History of Present Illness  72 y.o. female admitted on 04/13/17 for elective L direct anterior THA.  Pt with significant PMH of PVD, HTN, gout, Chronic low back pain, R carpal tunnel s/p release, L arm surgery (ulnar nerve).    Clinical Impression  Pt is POD #1 and was able to walk a short distance into the hallway with min assist overall and RW.  Pt is appropriately sore and painful, having trouble progressing her leg forward during gait due to burning pain.  Ice applied at end of session and RN notified for pain meds.  HEP program review initiated.   PT to follow acutely for deficits listed below.       Follow Up Recommendations DC plan and follow up therapy as arranged by surgeon    Equipment Recommendations  Rolling walker with 5" wheels    Recommendations for Other Services   NA    Precautions / Restrictions Restrictions LLE Weight Bearing: Weight bearing as tolerated      Mobility  Bed Mobility Overal bed mobility: Needs Assistance Bed Mobility: Supine to Sit     Supine to sit: Min assist;HOB elevated     General bed mobility comments: Min assist to help progress left leg to EOB and then separately provide assist to help pt pull up to sitting.   Transfers Overall transfer level: Needs assistance Equipment used: Rolling walker (2 wheeled) Transfers: Sit to/from Stand Sit to Stand: Min assist         General transfer comment: Min assist to support trunk to power up and to help control descent to sit.  Verbal cues for safe hand placement.   Ambulation/Gait Ambulation/Gait assistance: Min assist Ambulation Distance (Feet): 65 Feet Assistive device: Rolling walker (2 wheeled) Gait Pattern/deviations: Step-to pattern;Antalgic Gait velocity: decreased   General Gait Details: Min assist for balance and verbal cues for correct LE sequencing and  safe RW use.  Pt having trouble lifting left leg to progress it forward, she is instead sliding it forward.          Balance Overall balance assessment: Needs assistance Sitting-balance support: Feet supported;No upper extremity supported Sitting balance-Leahy Scale: Good     Standing balance support: Bilateral upper extremity supported Standing balance-Leahy Scale: Fair                               Pertinent Vitals/Pain Pain Assessment: Faces Faces Pain Scale: Hurts even more Pain Location: left hip Pain Descriptors / Indicators: Grimacing;Guarding Pain Intervention(s): Limited activity within patient's tolerance;Monitored during session;Repositioned;Ice applied    Home Living Family/patient expects to be discharged to:: Private residence Living Arrangements: Spouse/significant other;Children(daughter for a few days after coming home, husband after) Available Help at Discharge: Family;Available 24 hours/day Type of Home: House Home Access: Stairs to enter Entrance Stairs-Rails: None Entrance Stairs-Number of Steps: 4 Home Layout: Multi-level;Full bath on main level;Able to live on main level with bedroom/bathroom Home Equipment: Kasandra Knudsen - single point;Shower seat - built in;Walker - 2 wheels;Bedside commode      Prior Function Level of Independence: Independent with assistive device(s)         Comments: used cane to walk PTA, drives, works     Hand Dominance   Dominant Hand: Right    Extremity/Trunk Assessment   Upper Extremity Assessment Upper Extremity Assessment: Defer to OT evaluation  Lower Extremity Assessment Lower Extremity Assessment: LLE deficits/detail LLE Deficits / Details: left leg with normal post op pain and weakness.  Ankle 3-/5, knee 3-/5, hip 2/5    Cervical / Trunk Assessment Cervical / Trunk Assessment: Kyphotic  Communication   Communication: No difficulties  Cognition Arousal/Alertness: Awake/alert Behavior During  Therapy: WFL for tasks assessed/performed Overall Cognitive Status: Within Functional Limits for tasks assessed                                           Exercises Total Joint Exercises Ankle Circles/Pumps: AROM;Both;20 reps Quad Sets: AROM;Left;10 reps Heel Slides: AAROM;Left;10 reps   Assessment/Plan    PT Assessment Patient needs continued PT services  PT Problem List Decreased strength;Decreased range of motion;Decreased activity tolerance;Decreased balance;Decreased mobility;Decreased knowledge of use of DME;Pain       PT Treatment Interventions DME instruction;Gait training;Stair training;Functional mobility training;Therapeutic activities;Therapeutic exercise;Balance training;Patient/family education;Modalities;Manual techniques    PT Goals (Current goals can be found in the Care Plan section)  Acute Rehab PT Goals Patient Stated Goal: to be able to move the leg normally, decrease pain PT Goal Formulation: With patient/family Time For Goal Achievement: 04/21/17 Potential to Achieve Goals: Good    Frequency 7X/week           AM-PAC PT "6 Clicks" Daily Activity  Outcome Measure Difficulty turning over in bed (including adjusting bedclothes, sheets and blankets)?: Unable Difficulty moving from lying on back to sitting on the side of the bed? : Unable Difficulty sitting down on and standing up from a chair with arms (e.g., wheelchair, bedside commode, etc,.)?: Unable Help needed moving to and from a bed to chair (including a wheelchair)?: A Little Help needed walking in hospital room?: A Little Help needed climbing 3-5 steps with a railing? : A Lot 6 Click Score: 11    End of Session   Activity Tolerance: Patient limited by pain Patient left: in chair;with call bell/phone within reach;with family/visitor present Nurse Communication: Patient requests pain meds PT Visit Diagnosis: Muscle weakness (generalized) (M62.81);Difficulty in walking, not  elsewhere classified (R26.2);Pain Pain - Right/Left: Left Pain - part of body: Hip    Time: 8676-1950 PT Time Calculation (min) (ACUTE ONLY): 40 min   Charges:           Wells Guiles B. Lydell Moga, PT, DPT [phone]   PT Evaluation $PT Eval Moderate Complexity: 1 Mod PT Treatments $Gait Training: 8-22 mins $Therapeutic Exercise: 8-22 mins   04/14/2017, 12:27 PM

## 2017-04-14 NOTE — Anesthesia Postprocedure Evaluation (Signed)
Anesthesia Post Note  Patient: Tamara Fleming  Procedure(s) Performed: TOTAL LEFT HIP ARTHROPLASTY ANTERIOR APPROACH (Left Hip)     Patient location during evaluation: PACU Anesthesia Type: Spinal Level of consciousness: oriented and awake and alert Pain management: pain level controlled Vital Signs Assessment: post-procedure vital signs reviewed and stable Respiratory status: spontaneous breathing, respiratory function stable and patient connected to nasal cannula oxygen Cardiovascular status: blood pressure returned to baseline and stable Postop Assessment: no headache, no backache and no apparent nausea or vomiting Anesthetic complications: no    Last Vitals:  Vitals:   04/14/17 0015 04/14/17 0352  BP: (!) 109/55 139/63  Pulse: 72 80  Resp: 18 18  Temp: 37.1 C 36.4 C  SpO2: 98% 100%    Last Pain:  Vitals:   04/14/17 1200  TempSrc:   PainSc: [place]

## 2017-04-14 NOTE — Care Management Obs Status (Signed)
[place] NOTIFICATION   Patient Details  Name: Tamara Fleming MRN: [id number] Date of Birth: 06-Dec-1945   Medicare Observation Status Notification Given:  Yes    Carles Collet, RN 04/14/2017, 10:42 AM

## 2017-04-14 NOTE — Progress Notes (Signed)
   Assessment / Plan: 1 Day Post-Op  S/P Procedure(s) (LRB): TOTAL LEFT HIP ARTHROPLASTY ANTERIOR APPROACH (Left) by Dr. Ernesta Amble. Murphy on 04/13/2017  Principal Problem:   Primary osteoarthritis of left hip Active Problems:   Dyslipidemia   Essential hypertension   Primary osteoarthritis left hip AFVSN. Difficultly with pain control/spasm.   Early anxiety regarding spinal anesthesia improved as this wore off.  Adjust pain medications: Change Norco to Oxycodone for as needed oral medicine.  Start scheduled Tylenol.  Add as needed Dilaudid.  Substitute Celebrex for Toradol.  Continue Robaxin.  Single dose Decadron this morning.  Advance diet Up with therapy Incentive Spirometry Apply ice  Weight Bearing: Weight Bearing as Tolerated (WBAT)  Dressings: Maintain Mepilex.  VTE prophylaxis: Aspirin, SCDs, ambulation Dispo: Home possibly tomorrow.  Therapy evaluations still pending.  Subjective: Patient reports pain as moderate to severe.  Tolerating diet.  Urinating.  No CP, SOB.  Not yet OOB.  Objective:   VITALS:   Vitals:   04/13/17 1750 04/13/17 1930 04/14/17 0015 04/14/17 0352  BP: (!) 123/58 (!) 121/49 (!) 109/55 139/63  Pulse: 77 80 72 80  Resp: 15 18 18 18   Temp: (!) 97.5 F (36.4 C) (!) 97.5 F (36.4 C) 98.7 F (37.1 C) 97.6 F (36.4 C)  TempSrc: Oral Oral Oral Oral  SpO2: 100% 100% 98% 100%  Weight:      Height:       CBC Latest Ref Rng & Units 04/02/2017 02/17/2017 09/15/2016  WBC 4.0 - 10.5 K/uL 7.1 6.9 5.3  Hemoglobin 12.0 - 15.0 g/dL 11.8(L) 11.0(L) 12.9  Hematocrit 36.0 - 46.0 % 36.4 35.0 37.8  Platelets 150 - 400 K/uL 193 234 174   BMP Latest Ref Rng & Units 04/02/2017 09/30/2016 09/15/2016  Glucose 65 - 99 mg/dL 107(H) 99 -  BUN 6 - 20 mg/dL 16 15 -  Creatinine 0.44 - 1.00 mg/dL 0.86 0.95 1.10(H)  BUN/Creat Ratio 12 - 28 - 16 -  Sodium 135 - 145 mmol/L 138 139 -  Potassium 3.5 - 5.1 mmol/L 3.6 4.7 -  Chloride 101 - 111 mmol/L 102 98 -  CO2 22  - 32 mmol/L 24 27 -  Calcium 8.9 - 10.3 mg/dL 9.2 9.8 -   Intake/Output      01/08 0701 - 01/09 0700 01/09 0701 - 01/10 0700   P.O. 100    I.V. (mL/kg) 1200 (15.3)    Total Intake(mL/kg) 1300 (16.6)    Urine (mL/kg/hr) 352    Blood 100    Total Output 452    Net +848            Physical Exam: General: NAD.  Upright in bed.  Discomfort with movement of left leg-hip flexion . Resp: No increased wob Cardio: regular rate and rhythm ABD soft Neurologically intact MSK Neurovascularly intact Sensation intact distally Intact pulses distally Dorsiflexion/Plantar flexion intact Incision: dressing C/D/I   Prudencio Burly III, PA-C 04/14/2017, 8:33 AM

## 2017-04-14 NOTE — Progress Notes (Signed)
Physical Therapy Treatment Patient Details Name: Tamara Fleming MRN: [id number] DOB: 03/17/1946 Today's Date: 04/14/2017    History of Present Illness 72 y.o. female admitted on 04/13/17 for elective L direct anterior THA.  Pt with significant PMH of PVD, HTN, gout, Chronic low back pain, R carpal tunnel s/p release, L arm surgery (ulnar nerve).      PT Comments    POD #1 and this is her second session.  She was able to progress gait a bit further into the hallway with RW and did better progressing her left leg forward during gait.  HEP exercises progressed and pt left up in the recliner with ice packs.  PT will continue to follow acutely and I anticipate needing BID sessions tomorrow for stair training.    Follow Up Recommendations  DC plan and follow up therapy as arranged by surgeon     Equipment Recommendations  Rolling walker with 5" wheels    Recommendations for Other Services   NA     Precautions / Restrictions Precautions Precautions: Fall Restrictions LLE Weight Bearing: Weight bearing as tolerated    Mobility  Bed Mobility Overal bed mobility: Needs Assistance Bed Mobility: Supine to Sit     Supine to sit: Min assist;HOB elevated     General bed mobility comments: Min assist to help progress left leg to EOB and then separately provide assist to help pt pull up to sitting.   Transfers Overall transfer level: Needs assistance Equipment used: Rolling walker (2 wheeled) Transfers: Sit to/from Stand Sit to Stand: Min assist         General transfer comment: Min assist to support trunk to power up and to help control descent to sit.  Verbal cues for safe hand placement. Uncontrolled descent to sit.   Ambulation/Gait Ambulation/Gait assistance: Min assist Ambulation Distance (Feet): 70 Feet Assistive device: Rolling walker (2 wheeled) Gait Pattern/deviations: Step-to pattern;Antalgic Gait velocity: decreased   General Gait Details: Min assist for balance and  verbal cues for correct LE sequencing and safe RW use. Pt reporting burning incisional pain.           Balance Overall balance assessment: Needs assistance Sitting-balance support: Feet supported;No upper extremity supported Sitting balance-Leahy Scale: Good     Standing balance support: Bilateral upper extremity supported Standing balance-Leahy Scale: Fair                              Cognition Arousal/Alertness: Awake/alert Behavior During Therapy: WFL for tasks assessed/performed Overall Cognitive Status: Within Functional Limits for tasks assessed                                        Exercises Total Joint Exercises Quad Sets: AROM;Left;10 reps Short Arc Quad: AROM;Left;10 reps Heel Slides: AAROM;Left;10 reps Hip ABduction/ADduction: AAROM;Left;10 reps        Pertinent Vitals/Pain Pain Assessment: Faces Faces Pain Scale: Hurts even more Pain Location: left hip Pain Descriptors / Indicators: Grimacing;Guarding Pain Intervention(s): Limited activity within patient's tolerance;Monitored during session;Repositioned;Ice applied           PT Goals (current goals can now be found in the care plan section) Acute Rehab PT Goals Patient Stated Goal: to be able to move the leg normally, decrease pain Progress towards PT goals: Progressing toward goals    Frequency    7X/week  PT Plan Current plan remains appropriate       AM-PAC PT "6 Clicks" Daily Activity  Outcome Measure  Difficulty turning over in bed (including adjusting bedclothes, sheets and blankets)?: Unable Difficulty moving from lying on back to sitting on the side of the bed? : Unable Difficulty sitting down on and standing up from a chair with arms (e.g., wheelchair, bedside commode, etc,.)?: Unable Help needed moving to and from a bed to chair (including a wheelchair)?: A Little Help needed walking in hospital room?: A Little Help needed climbing 3-5 steps  with a railing? : A Lot 6 Click Score: 11    End of Session   Activity Tolerance: Patient limited by pain Patient left: in chair;with call bell/phone within reach;with family/visitor present   PT Visit Diagnosis: Muscle weakness (generalized) (M62.81);Difficulty in walking, not elsewhere classified (R26.2);Pain Pain - Right/Left: Left Pain - part of body: Hip     Time: 1610-1640 PT Time Calculation (min) (ACUTE ONLY): 30 min  Charges:  $Gait Training: 8-22 mins $Therapeutic Exercise: 8-22 mins          Anup Brigham B. [place], [place], DPT [phone]            04/14/2017, 5:12 PM

## 2017-04-14 NOTE — Progress Notes (Signed)
Orthopedic Tech Progress Note Patient Details:  Tamara Fleming 1945-08-19 [id number]  Patient ID: Tamara Fleming, female   DOB: 30-Oct-1945, 72 y.o.   MRN: [id number] Pt cant have ohf due to age restrictions  Karolee Stamps 04/14/2017, 11:14 PM

## 2017-04-15 ENCOUNTER — Other Ambulatory Visit: Payer: Self-pay

## 2017-04-15 ENCOUNTER — Encounter (HOSPITAL_COMMUNITY): Payer: Self-pay | Admitting: General Practice

## 2017-04-15 MED ORDER — POLYETHYLENE GLYCOL 3350 17 G PO PACK
17.0000 g | PACK | Freq: Two times a day (BID) | ORAL | Status: DC
  Administered 2017-04-15: 17 g via ORAL
  Filled 2017-04-15: qty 1

## 2017-04-15 MED ORDER — ALPRAZOLAM 0.25 MG PO TABS: 0.2500 mg | ORAL_TABLET | Freq: Two times a day (BID) | ORAL | 0 refills | Status: DC | PRN

## 2017-04-15 MED ORDER — CELECOXIB 200 MG PO CAPS: 200.0000 mg | ORAL_CAPSULE | Freq: Two times a day (BID) | ORAL | 0 refills | Status: DC

## 2017-04-15 NOTE — Discharge Summary (Signed)
Discharge Summary  Patient ID: Tamara Fleming MRN: [id number] DOB/AGE: 12-21-1945 72 y.o.  Admit date: 04/13/2017 Discharge date: 04/15/2017  Admission Diagnoses:  Primary osteoarthritis of left hip  Discharge Diagnoses:  Principal Problem:   Primary osteoarthritis of left hip Active Problems:   Dyslipidemia   Essential hypertension   Past Medical History:  Diagnosis Date  . Carpal tunnel syndrome 05/09/2014   Right  . Chest pain   . Chronic low back pain   . Complication of anesthesia    small mouth  . Fatigue   . GERD (gastroesophageal reflux disease)    occ  . Gout   . Heart murmur   . Hiatal hernia   . Hip pain    Left  . Hyperlipidemia   . Hypertension   . OA (osteoarthritis)   . Peripheral vascular disease ([place])    plhebitis 20 yrs ago    Surgeries: Procedure(s): TOTAL LEFT HIP ARTHROPLASTY ANTERIOR APPROACH on 04/13/2017   Consultants (if any):   Discharged Condition: Improved  Hospital Course: Tamara Fleming is an 72 y.o. female who was admitted 04/13/2017 with a diagnosis of Primary osteoarthritis of left hip and went to the operating room on 04/13/2017 and underwent the above named procedures.    She was given perioperative antibiotics:  Anti-infectives (From admission, onward)   Start     Dose/Rate Route Frequency Ordered Stop   04/13/17 1830  ceFAZolin (ANCEF) IVPB 1 g/50 mL premix     1 g 100 mL/hr over 30 Minutes Intravenous Every 6 hours 04/13/17 1748 04/14/17 0417   04/13/17 0931  ceFAZolin (ANCEF) IVPB 2g/100 mL premix     2 g 200 mL/hr over 30 Minutes Intravenous On call to O.R. 04/13/17 9323 04/13/17 1320    .  She was given sequential compression devices, early ambulation, and Aspirin for DVT prophylaxis.  She benefited maximally from the hospital stay and there were no complications.  She stayed overnight 2 nights to optimize pain control and increase mobilization.  Recent vital signs:  Vitals:   04/14/17 2032 04/15/17 0457  BP: (!)  125/59 (!) 113/54  Pulse: 70 62  Resp: 18 18  Temp: 97.9 F (36.6 C) 97.8 F (36.6 C)  SpO2: 99% 94%    Recent laboratory studies:  Lab Results  Component Value Date   HGB 11.8 (L) 04/02/2017   HGB 11.0 (L) 02/17/2017   HGB 12.9 09/15/2016   Lab Results  Component Value Date   WBC 7.1 04/02/2017   PLT 193 04/02/2017   No results found for: INR Lab Results  Component Value Date   NA 138 04/02/2017   K 3.6 04/02/2017   CL 102 04/02/2017   CO2 24 04/02/2017   BUN 16 04/02/2017   CREATININE 0.86 04/02/2017   GLUCOSE 107 (H) 04/02/2017    Discharge Medications:   Allergies as of 04/15/2017      Reactions   Ciprofloxacin Anaphylaxis   Facial, tongue and throat    Floxin [ofloxacin] Anaphylaxis   Facial, tongue and throat    Sulfa Antibiotics Diarrhea, Nausea And Vomiting      Medication List    STOP taking these medications   aspirin 81 MG tablet Replaced by:  aspirin EC 325 MG tablet   HYDROcodone-acetaminophen 5-325 MG tablet Commonly known as:  NORCO/VICODIN   metroNIDAZOLE 500 MG tablet Commonly known as:  FLAGYL   naproxen 500 MG tablet Commonly known as:  NAPROSYN     TAKE these medications  acetaminophen 500 MG tablet Commonly known as:  TYLENOL Take 2 tablets (1,000 mg total) by mouth every 8 (eight) hours for 14 days. For Pain.   ALPRAZolam 0.25 MG tablet Commonly known as:  XANAX Take 1 tablet (0.25 mg total) by mouth 2 (two) times daily as needed for anxiety or sleep. What changed:    when to take this  reasons to take this   aspirin EC 325 MG tablet Take 1 tablet (325 mg total) by mouth daily. For 30 days post op for DVT Prophylaxis Replaces:  aspirin 81 MG tablet   atorvastatin 10 MG tablet Commonly known as:  LIPITOR TAKE 1 TABLET BY MOUTH AT BEDTIME   baclofen 10 MG tablet Commonly known as:  LIORESAL Take 1 tablet (10 mg total) by mouth 3 (three) times daily as needed for muscle spasms.   celecoxib 200 MG capsule Commonly  known as:  CELEBREX Take 1 capsule (200 mg total) by mouth 2 (two) times daily. For 2 weeks post op for pain and inflammation.  Discontinue Ibuprofen or other Anti-inflammatory medicine when taking this medicine.   docusate sodium 100 MG capsule Commonly known as:  COLACE Take 1 capsule (100 mg total) by mouth 2 (two) times daily. To prevent constipation while taking pain medication.   furosemide 20 MG tablet Commonly known as:  LASIX Take 20 mg by mouth daily as needed for edema.   losartan-hydrochlorothiazide 100-12.5 MG tablet Commonly known as:  HYZAAR TAKE 1 TABLET BY MOUTH ONCE DAILY   omeprazole 20 MG capsule Commonly known as:  PRILOSEC Take 1 capsule (20 mg total) by mouth daily. 30 days for gastroprotection while taking NSAIDs.   ondansetron 4 MG tablet Commonly known as:  ZOFRAN Take 1 tablet (4 mg total) by mouth every 8 (eight) hours as needed for nausea or vomiting.   oxyCODONE 5 MG immediate release tablet Commonly known as:  ROXICODONE Take 1-2 tablets (5-10 mg total) by mouth every 4 (four) hours as needed for breakthrough pain.       Diagnostic Studies: Dg C-arm 1-60 Min  Result Date: 04/13/2017 CLINICAL DATA:  Status post left hip arthroplasty. EXAM: DG C-ARM 61-120 MIN; OPERATIVE LEFT HIP WITH PELVIS COMPARISON:  09/30/2016 FINDINGS: Hardware components of a left total hip arthroplasty device are identified. There is no periprosthetic fracture or subluxation identified. IMPRESSION: 1. Status post left hip arthroplasty. No immediate complications visualized. Electronically Signed   By: Kerby Moors M.D.   On: 04/13/2017 15:24   Dg Hip Operative Unilat With Pelvis Left  Result Date: 04/13/2017 CLINICAL DATA:  Status post left hip arthroplasty. EXAM: DG C-ARM 61-120 MIN; OPERATIVE LEFT HIP WITH PELVIS COMPARISON:  09/30/2016 FINDINGS: Hardware components of a left total hip arthroplasty device are identified. There is no periprosthetic fracture or subluxation  identified. IMPRESSION: 1. Status post left hip arthroplasty. No immediate complications visualized. Electronically Signed   By: Kerby Moors M.D.   On: 04/13/2017 15:24    Disposition: 01-Home or Self Care    Follow-up Information    Tamara Butters, MD Follow up.   Specialty:  Orthopedic Surgery Contact information: [address] [place] [address] [phone]        [facility] Follow up.   Specialty:  [place] Why:  A representative from [facility] will contact you to arrange start date and time for your therapy. Contact information: [address] [place]  [address] [phone]  Signed: Prudencio Burly III PA-C 04/15/2017, 8:49 AM

## 2017-04-15 NOTE — Progress Notes (Signed)
Physical Therapy Treatment Patient Details Name: Tamara Fleming MRN: [id number] DOB: 01-10-1946 Today's Date: 04/15/2017    History of Present Illness 72 y.o. female admitted on 04/13/17 for elective L direct anterior THA.  Pt with significant PMH of PVD, HTN, gout, Chronic low back pain, R carpal tunnel s/p release, L arm surgery (ulnar nerve).      PT Comments    Pt is POD #2 and is moving normally.  She perseverates a bit on her inability to lift her left leg yet (she can lift it, just not well).  She was able to walk and start stair training with her husband who will be the one helping her in the house.  LE HEP reviewed.  I will need to see her one more time this PM to reinforce stair training and practice her standing exercises, as well as practice getting into and out of the right side of the bed with the bed flat.    Follow Up Recommendations  DC plan and follow up therapy as arranged by surgeon     Equipment Recommendations  Rolling walker with 5" wheels    Recommendations for Other Services   NA     Precautions / Restrictions Precautions Precautions: Fall Restrictions LLE Weight Bearing: Weight bearing as tolerated    Mobility  Bed Mobility Overal bed mobility: Needs Assistance Bed Mobility: Supine to Sit     Supine to sit: Min assist(HOB flat)     General bed mobility comments: Min assist to help progress legs over EOB and separately provide support at her trunk to pull up to sitting EOB.  Bed flattened to help simulate home bed.  Daughter informs me she goes out of the other side of the bed normally, though (R side).  So, during our PM session we will practice going into and out of the right side.   Transfers Overall transfer level: Needs assistance Equipment used: Rolling walker (2 wheeled) Transfers: Sit to/from Stand Sit to Stand: Min assist         General transfer comment: Min assist to stabilize RW and pt's trunk for transition to stand.    Ambulation/Gait Ambulation/Gait assistance: Min guard Ambulation Distance (Feet): 80 Feet Assistive device: Rolling walker (2 wheeled) Gait Pattern/deviations: Step-to pattern;Antalgic Gait velocity: decreased Gait velocity interpretation: Below normal speed for age/gender General Gait Details: Verbal cues for correct LE sequencing and RW use (step into the RW with sore leg first).  Pt needs min guard assist for safety.     Stairs Stairs: Yes   Stair Management: No rails;Step to pattern;Forwards;Backwards;With walker Number of Stairs: 3(2 steps, 1 curb) General stair comments: Simulated home entry by going backwards up first two steps with RW and forward over small curb step simulating door threshold).          Balance Overall balance assessment: Needs assistance Sitting-balance support: Feet supported;No upper extremity supported Sitting balance-Leahy Scale: Good     Standing balance support: Bilateral upper extremity supported Standing balance-Leahy Scale: Fair                              Cognition Arousal/Alertness: Awake/alert Behavior During Therapy: WFL for tasks assessed/performed Overall Cognitive Status: Within Functional Limits for tasks assessed  Exercises Total Joint Exercises Ankle Circles/Pumps: AROM;Both;20 reps Quad Sets: AROM;Both;10 reps Short Arc Quad: AROM;Left;10 reps Heel Slides: AAROM;Left;10 reps Hip ABduction/ADduction: AAROM;Left;10 reps Long Arc Quad: AROM;Left;10 reps        Pertinent Vitals/Pain Pain Assessment: 0-10 Pain Score: 7  Pain Location: left hip Pain Descriptors / Indicators: Grimacing;Guarding Pain Intervention(s): Limited activity within patient's tolerance;Monitored during session;Repositioned;Ice applied           PT Goals (current goals can now be found in the care plan section) Acute Rehab PT Goals Patient Stated Goal: to be able to move the  leg normally, decrease pain Progress towards PT goals: Progressing toward goals    Frequency    7X/week      PT Plan Current plan remains appropriate       AM-PAC PT "6 Clicks" Daily Activity  Outcome Measure  Difficulty turning over in bed (including adjusting bedclothes, sheets and blankets)?: Unable Difficulty moving from lying on back to sitting on the side of the bed? : Unable Difficulty sitting down on and standing up from a chair with arms (e.g., wheelchair, bedside commode, etc,.)?: Unable Help needed moving to and from a bed to chair (including a wheelchair)?: A Little Help needed walking in hospital room?: A Little Help needed climbing 3-5 steps with a railing? : A Little 6 Click Score: 12    End of Session   Activity Tolerance: Patient limited by pain Patient left: in chair;with call bell/phone within reach;with family/visitor present   PT Visit Diagnosis: Muscle weakness (generalized) (M62.81);Difficulty in walking, not elsewhere classified (R26.2);Pain Pain - Right/Left: Left Pain - part of body: Hip     Time: 3568-6168 PT Time Calculation (min) (ACUTE ONLY): 57 min  Charges:  $Gait Training: 38-52 mins $Therapeutic Exercise: 8-22 mins          Kristain Hu B. [place], [place], DPT [phone]            04/15/2017, 12:53 PM

## 2017-04-15 NOTE — Progress Notes (Signed)
Pt discharged to home in stable condition.  All discharge instructions reviewed with pt and spouse. PIV removed from right hand with catheter tip intact, pt tol well, no bleeding noted.  Pt transported by this nurse via wheelchair, husband transported pt belongings.  AKingRNBSN

## 2017-04-15 NOTE — Progress Notes (Signed)
   Assessment / Plan: 2 Days Post-Op  S/P Procedure(s) (LRB): TOTAL LEFT HIP ARTHROPLASTY ANTERIOR APPROACH (Left) by Dr. Ernesta Amble. Murphy on 04/13/2017  Principal Problem:   Primary osteoarthritis of left hip Active Problems:   Dyslipidemia   Essential hypertension   Primary osteoarthritis left hip Better pain control and mobilization. Eating, drinking, urinating.  Continue pain medications:  Oxycodone for as needed oral medicine.  Scheduled Tylenol.  As needed Dilaudid.  Celebrex.  Continue Robaxin.   Xanax prn for anxiety / sleep.    Up with therapy - Plan for BID sessions today. Incentive Spirometry Apply ice  Weight Bearing: Weight Bearing as Tolerated (WBAT)  Dressings: Maintain Mepilex.  VTE prophylaxis: Aspirin, SCDs, ambulation Dispo: Home today after BID therapy sessions.    Subjective: Patient reports pain as moderate, improving.  Tolerating diet.  Urinating.  No CP, SOB.    Objective:   VITALS:   Vitals:   04/14/17 0352 04/14/17 1509 04/14/17 2032 04/15/17 0457  BP: 139/63 (!) 108/58 (!) 125/59 (!) 113/54  Pulse: 80 72 70 62  Resp: 18 18 18 18   Temp: 97.6 F (36.4 C) 98.6 F (37 C) 97.9 F (36.6 C) 97.8 F (36.6 C)  TempSrc: Oral Oral Oral Oral  SpO2: 100% 99% 99% 94%  Weight:      Height:       CBC Latest Ref Rng & Units 04/02/2017 02/17/2017 09/15/2016  WBC 4.0 - 10.5 K/uL 7.1 6.9 5.3  Hemoglobin 12.0 - 15.0 g/dL 11.8(L) 11.0(L) 12.9  Hematocrit 36.0 - 46.0 % 36.4 35.0 37.8  Platelets 150 - 400 K/uL 193 234 174   BMP Latest Ref Rng & Units 04/02/2017 09/30/2016 09/15/2016  Glucose 65 - 99 mg/dL 107(H) 99 -  BUN 6 - 20 mg/dL 16 15 -  Creatinine 0.44 - 1.00 mg/dL 0.86 0.95 1.10(H)  BUN/Creat Ratio 12 - 28 - 16 -  Sodium 135 - 145 mmol/L 138 139 -  Potassium 3.5 - 5.1 mmol/L 3.6 4.7 -  Chloride 101 - 111 mmol/L 102 98 -  CO2 22 - 32 mmol/L 24 27 -  Calcium 8.9 - 10.3 mg/dL 9.2 9.8 -   Intake/Output      01/09 0701 - 01/10 0700 01/10 0701 -  01/11 0700   P.O. 900    I.V. (mL/kg)     Total Intake(mL/kg) 900 (11.5)    Urine (mL/kg/hr) 6 (0)    Blood     Total Output 6    Net +894            Physical Exam: General: NAD.  Upright in bed.  Daughter at bedside.  Patient able to transfer to bed with minimal assistance. Resp: No increased wob Cardio: regular rate and rhythm ABD soft Neurologically intact MSK Neurovascularly intact Sensation intact distally Feet warm Dorsiflexion/Plantar flexion intact Incision: dressing C/D/I   Prudencio Burly III, PA-C 04/15/2017, 8:42 AM

## 2017-04-15 NOTE — Progress Notes (Signed)
Physical Therapy Treatment Patient Details Name: Tamara Fleming MRN: [id number] DOB: 1946-03-10 Today's Date: 04/15/2017    History of Present Illness 72 y.o. female admitted on 04/13/17 for elective L direct anterior THA.  Pt with significant PMH of PVD, HTN, gout, Chronic low back pain, R carpal tunnel s/p release, L arm surgery (ulnar nerve).      PT Comments    Stair training reinforced.  Pt's husband guarding patient and assisting in her mobility throughout treatment session.  Pt is ready to d/c home.  PT to follow acutely until d/c confirmed.      Follow Up Recommendations  DC plan and follow up therapy as arranged by surgeon     Equipment Recommendations  Rolling walker with 5" wheels    Recommendations for Other Services   NA     Precautions / Restrictions Precautions Precautions: Fall Restrictions LLE Weight Bearing: Weight bearing as tolerated    Mobility  Bed Mobility Overal bed mobility: Needs Assistance Bed Mobility: Supine to Sit     Supine to sit: Min assist(HOB flat)     General bed mobility comments: Pt's husband assisted her up from flat bed out of the right side simulating her bed at home.   Transfers Overall transfer level: Needs assistance Equipment used: Rolling walker (2 wheeled) Transfers: Sit to/from Stand Sit to Stand: Min assist         General transfer comment: Min assist to stabilize RW and pt's trunk for transition to stand.  Husband also helping with this to practice.    Ambulation/Gait Ambulation/Gait assistance: Min guard Ambulation Distance (Feet): 80 Feet Assistive device: Rolling walker (2 wheeled) Gait Pattern/deviations: Step-to pattern;Antalgic Gait velocity: decreased Gait velocity interpretation: Below normal speed for age/gender General Gait Details: Verbal cues for correct LE sequencing and RW use (step into the RW with sore leg first).  Pt needs min guard assist for safety.  Pt's husband guarding during gait.     Stairs Stairs: Yes   Stair Management: No rails;Step to pattern;Forwards;Backwards;With walker   General stair comments: Simulated home entry by going backwards up first two steps with RW and forward over small curb step simulating door threshold). Pt and husband needed reinforcement of correct LE sequence, but demonstrated correct technique when practicing stairs again this afternoon.          Balance Overall balance assessment: Needs assistance Sitting-balance support: Feet supported;No upper extremity supported Sitting balance-Leahy Scale: Good     Standing balance support: Bilateral upper extremity supported Standing balance-Leahy Scale: Fair                              Cognition Arousal/Alertness: Awake/alert Behavior During Therapy: WFL for tasks assessed/performed Overall Cognitive Status: Within Functional Limits for tasks assessed                                        Exercises Total Joint Exercises Hip ABduction/ADduction: AROM;Left;10 reps;Standing Knee Flexion: AROM;Left;10 reps;Standing Marching in Standing: AROM;Left;10 reps;Standing Standing Hip Extension: AROM;Left;10 reps;Standing        Pertinent Vitals/Pain Pain Assessment: 0-10 Pain Score: 8  Pain Location: left hip Pain Descriptors / Indicators: Grimacing;Guarding Pain Intervention(s): Limited activity within patient's tolerance;Monitored during session;Repositioned    Home Living Family/patient expects to be discharged to:: Private residence Living Arrangements: Spouse/significant other  PT Goals (current goals can now be found in the care plan section) Acute Rehab PT Goals Patient Stated Goal: to be able to move the leg normally, decrease pain Progress towards PT goals: Progressing toward goals    Frequency    7X/week      PT Plan Current plan remains appropriate       AM-PAC PT "6 Clicks" Daily Activity  Outcome  Measure  Difficulty turning over in bed (including adjusting bedclothes, sheets and blankets)?: Unable Difficulty moving from lying on back to sitting on the side of the bed? : Unable Difficulty sitting down on and standing up from a chair with arms (e.g., wheelchair, bedside commode, etc,.)?: Unable Help needed moving to and from a bed to chair (including a wheelchair)?: A Little Help needed walking in hospital room?: A Little Help needed climbing 3-5 steps with a railing? : A Little 6 Click Score: 12    End of Session   Activity Tolerance: Patient limited by pain Patient left: with call bell/phone within reach;with family/visitor present;in bed(EOB getting ready for d/c with her husband. )   PT Visit Diagnosis: Muscle weakness (generalized) (M62.81);Difficulty in walking, not elsewhere classified (R26.2);Pain Pain - Right/Left: Left Pain - part of body: Hip     Time: 1501-1540 PT Time Calculation (min) (ACUTE ONLY): 39 min  Charges:  $Gait Training: 23-37 mins $Therapeutic Exercise: 8-22 mins          Rayven Rettig B. [place], [place], DPT [phone]            04/15/2017, 6:30 PM

## 2017-04-16 DIAGNOSIS — Z471 Aftercare following joint replacement surgery: Secondary | ICD-10-CM | POA: Diagnosis not present

## 2017-04-16 DIAGNOSIS — I739 Peripheral vascular disease, unspecified: Secondary | ICD-10-CM | POA: Diagnosis not present

## 2017-04-16 DIAGNOSIS — F419 Anxiety disorder, unspecified: Secondary | ICD-10-CM | POA: Diagnosis not present

## 2017-04-16 DIAGNOSIS — M545 Low back pain: Secondary | ICD-10-CM | POA: Diagnosis not present

## 2017-04-16 DIAGNOSIS — I10 Essential (primary) hypertension: Secondary | ICD-10-CM | POA: Diagnosis not present

## 2017-04-16 DIAGNOSIS — Z96642 Presence of left artificial hip joint: Secondary | ICD-10-CM | POA: Diagnosis not present

## 2017-04-19 DIAGNOSIS — M545 Low back pain: Secondary | ICD-10-CM | POA: Diagnosis not present

## 2017-04-19 DIAGNOSIS — M1612 Unilateral primary osteoarthritis, left hip: Secondary | ICD-10-CM | POA: Diagnosis not present

## 2017-04-19 DIAGNOSIS — Z471 Aftercare following joint replacement surgery: Secondary | ICD-10-CM | POA: Diagnosis not present

## 2017-04-19 DIAGNOSIS — F419 Anxiety disorder, unspecified: Secondary | ICD-10-CM | POA: Diagnosis not present

## 2017-04-19 DIAGNOSIS — I739 Peripheral vascular disease, unspecified: Secondary | ICD-10-CM | POA: Diagnosis not present

## 2017-04-19 DIAGNOSIS — Z96642 Presence of left artificial hip joint: Secondary | ICD-10-CM | POA: Diagnosis not present

## 2017-04-19 DIAGNOSIS — I10 Essential (primary) hypertension: Secondary | ICD-10-CM | POA: Diagnosis not present

## 2017-04-21 DIAGNOSIS — I10 Essential (primary) hypertension: Secondary | ICD-10-CM | POA: Diagnosis not present

## 2017-04-21 DIAGNOSIS — F419 Anxiety disorder, unspecified: Secondary | ICD-10-CM | POA: Diagnosis not present

## 2017-04-21 DIAGNOSIS — M545 Low back pain: Secondary | ICD-10-CM | POA: Diagnosis not present

## 2017-04-21 DIAGNOSIS — Z471 Aftercare following joint replacement surgery: Secondary | ICD-10-CM | POA: Diagnosis not present

## 2017-04-21 DIAGNOSIS — Z96642 Presence of left artificial hip joint: Secondary | ICD-10-CM | POA: Diagnosis not present

## 2017-04-21 DIAGNOSIS — I739 Peripheral vascular disease, unspecified: Secondary | ICD-10-CM | POA: Diagnosis not present

## 2017-04-23 DIAGNOSIS — F419 Anxiety disorder, unspecified: Secondary | ICD-10-CM | POA: Diagnosis not present

## 2017-04-23 DIAGNOSIS — I10 Essential (primary) hypertension: Secondary | ICD-10-CM | POA: Diagnosis not present

## 2017-04-23 DIAGNOSIS — I739 Peripheral vascular disease, unspecified: Secondary | ICD-10-CM | POA: Diagnosis not present

## 2017-04-23 DIAGNOSIS — Z471 Aftercare following joint replacement surgery: Secondary | ICD-10-CM | POA: Diagnosis not present

## 2017-04-23 DIAGNOSIS — Z96642 Presence of left artificial hip joint: Secondary | ICD-10-CM | POA: Diagnosis not present

## 2017-04-23 DIAGNOSIS — M545 Low back pain: Secondary | ICD-10-CM | POA: Diagnosis not present

## 2017-04-26 DIAGNOSIS — Z471 Aftercare following joint replacement surgery: Secondary | ICD-10-CM | POA: Diagnosis not present

## 2017-04-26 DIAGNOSIS — I739 Peripheral vascular disease, unspecified: Secondary | ICD-10-CM | POA: Diagnosis not present

## 2017-04-26 DIAGNOSIS — Z96642 Presence of left artificial hip joint: Secondary | ICD-10-CM | POA: Diagnosis not present

## 2017-04-26 DIAGNOSIS — M545 Low back pain: Secondary | ICD-10-CM | POA: Diagnosis not present

## 2017-04-26 DIAGNOSIS — I10 Essential (primary) hypertension: Secondary | ICD-10-CM | POA: Diagnosis not present

## 2017-04-26 DIAGNOSIS — F419 Anxiety disorder, unspecified: Secondary | ICD-10-CM | POA: Diagnosis not present

## 2017-04-28 DIAGNOSIS — M25552 Pain in left hip: Secondary | ICD-10-CM | POA: Diagnosis not present

## 2017-04-28 DIAGNOSIS — M6281 Muscle weakness (generalized): Secondary | ICD-10-CM | POA: Diagnosis not present

## 2017-04-28 DIAGNOSIS — M25652 Stiffness of left hip, not elsewhere classified: Secondary | ICD-10-CM | POA: Diagnosis not present

## 2017-04-28 DIAGNOSIS — M1612 Unilateral primary osteoarthritis, left hip: Secondary | ICD-10-CM | POA: Diagnosis not present

## 2017-04-28 DIAGNOSIS — R262 Difficulty in walking, not elsewhere classified: Secondary | ICD-10-CM | POA: Diagnosis not present

## 2017-05-03 DIAGNOSIS — R262 Difficulty in walking, not elsewhere classified: Secondary | ICD-10-CM | POA: Diagnosis not present

## 2017-05-03 DIAGNOSIS — M25552 Pain in left hip: Secondary | ICD-10-CM | POA: Diagnosis not present

## 2017-05-03 DIAGNOSIS — M25652 Stiffness of left hip, not elsewhere classified: Secondary | ICD-10-CM | POA: Diagnosis not present

## 2017-05-03 DIAGNOSIS — M6281 Muscle weakness (generalized): Secondary | ICD-10-CM | POA: Diagnosis not present

## 2017-05-06 DIAGNOSIS — M25552 Pain in left hip: Secondary | ICD-10-CM | POA: Diagnosis not present

## 2017-05-06 DIAGNOSIS — M6281 Muscle weakness (generalized): Secondary | ICD-10-CM | POA: Diagnosis not present

## 2017-05-06 DIAGNOSIS — R262 Difficulty in walking, not elsewhere classified: Secondary | ICD-10-CM | POA: Diagnosis not present

## 2017-05-06 DIAGNOSIS — M25652 Stiffness of left hip, not elsewhere classified: Secondary | ICD-10-CM | POA: Diagnosis not present

## 2017-05-10 DIAGNOSIS — M25652 Stiffness of left hip, not elsewhere classified: Secondary | ICD-10-CM | POA: Diagnosis not present

## 2017-05-10 DIAGNOSIS — M6281 Muscle weakness (generalized): Secondary | ICD-10-CM | POA: Diagnosis not present

## 2017-05-10 DIAGNOSIS — M25552 Pain in left hip: Secondary | ICD-10-CM | POA: Diagnosis not present

## 2017-05-10 DIAGNOSIS — R262 Difficulty in walking, not elsewhere classified: Secondary | ICD-10-CM | POA: Diagnosis not present

## 2017-05-12 DIAGNOSIS — M25552 Pain in left hip: Secondary | ICD-10-CM | POA: Diagnosis not present

## 2017-05-13 DIAGNOSIS — M545 Low back pain: Secondary | ICD-10-CM | POA: Diagnosis not present

## 2017-05-13 DIAGNOSIS — M5126 Other intervertebral disc displacement, lumbar region: Secondary | ICD-10-CM | POA: Diagnosis not present

## 2017-05-13 DIAGNOSIS — M5116 Intervertebral disc disorders with radiculopathy, lumbar region: Secondary | ICD-10-CM | POA: Diagnosis not present

## 2017-05-15 DIAGNOSIS — M10071 Idiopathic gout, right ankle and foot: Secondary | ICD-10-CM | POA: Diagnosis not present

## 2017-05-24 DIAGNOSIS — M6281 Muscle weakness (generalized): Secondary | ICD-10-CM | POA: Diagnosis not present

## 2017-05-24 DIAGNOSIS — R262 Difficulty in walking, not elsewhere classified: Secondary | ICD-10-CM | POA: Diagnosis not present

## 2017-05-24 DIAGNOSIS — M25552 Pain in left hip: Secondary | ICD-10-CM | POA: Diagnosis not present

## 2017-05-24 DIAGNOSIS — M25652 Stiffness of left hip, not elsewhere classified: Secondary | ICD-10-CM | POA: Diagnosis not present

## 2017-05-31 ENCOUNTER — Ambulatory Visit (INDEPENDENT_AMBULATORY_CARE_PROVIDER_SITE_OTHER): Payer: Medicare Other | Admitting: Unknown Physician Specialty

## 2017-05-31 ENCOUNTER — Encounter: Payer: Self-pay | Admitting: Unknown Physician Specialty

## 2017-05-31 VITALS — BP 103/70 | HR 78 | Temp 98.5°F | Wt 163.5 lb

## 2017-05-31 DIAGNOSIS — I10 Essential (primary) hypertension: Secondary | ICD-10-CM

## 2017-05-31 DIAGNOSIS — K529 Noninfective gastroenteritis and colitis, unspecified: Secondary | ICD-10-CM

## 2017-05-31 DIAGNOSIS — G894 Chronic pain syndrome: Secondary | ICD-10-CM | POA: Diagnosis not present

## 2017-05-31 DIAGNOSIS — M546 Pain in thoracic spine: Secondary | ICD-10-CM

## 2017-05-31 MED ORDER — HYDROCODONE-ACETAMINOPHEN 5-325 MG PO TABS: 1.0000 | ORAL_TABLET | Freq: Four times a day (QID) | ORAL | 0 refills | Status: DC | PRN

## 2017-05-31 MED ORDER — OMEPRAZOLE 20 MG PO CPDR: 20.0000 mg | DELAYED_RELEASE_CAPSULE | Freq: Every day | ORAL | 3 refills | Status: DC

## 2017-05-31 MED ORDER — ONDANSETRON HCL 4 MG PO TABS: 4.0000 mg | ORAL_TABLET | Freq: Three times a day (TID) | ORAL | 0 refills | Status: DC | PRN

## 2017-05-31 NOTE — Assessment & Plan Note (Signed)
Stop Losartan due to lower than normal BP and feelings of presyncope

## 2017-05-31 NOTE — Assessment & Plan Note (Signed)
Pt is back with me for pain management. Will rx for Hydrocodone #60 BID as that was her previous dose before failed hip.  Will rx #56 and see back 4 weeks and renew pain contract.

## 2017-05-31 NOTE — Progress Notes (Signed)
BP 103/70   Pulse 78   Temp 98.5 F (36.9 C) (Oral)   Wt 163 lb 8 oz (74.2 kg)   LMP  (LMP Unknown)   SpO2 98%   BMI 27.21 kg/m    Subjective:    Patient ID: Tamara Fleming, female    DOB: 14-Apr-1945, 72 y.o.   MRN: [id number]  HPI: Tamara Fleming is a 72 y.o. female  Chief Complaint  Patient presents with  . Diarrhea    pt states she had some vomiting and diarrhea at the end of last week. States she still has no appetite, burping a lot, feels like she has ulcers in her mouth, and a raw feeling in her mouth and throat   Pt states she woke about 6 days ago with severe vomiting and diarrhea.  This lasted all night.  Since then, she has eaten very little.  She is nauseated but not vomiting.  States she burps frequently, even with water.  Diarrhea has resolved.  She still feels weak.  Noting ulcers in mought She is feeling hypotensive and faint and BP lower than typical  Thoracic pain She is having severe thoracic back pain that won't go away.    Recent medical history significant for LTHA on January 8th.    Pain control Received #40 hydrocodone from her hip surgeon Dr Noemi Chapel.  Taking it twice a day.  Her pain is better following hip surgery but still struggles with back pain and gout.  She would like to re-establish pain contract here/     Relevant past medical, surgical, family and social history reviewed and updated as indicated. Interim medical history since our last visit reviewed. Allergies and medications reviewed and updated.  Review of Systems  Constitutional: Positive for fatigue.  HENT: Negative.   Respiratory: Negative.   Gastrointestinal: Negative.   Musculoskeletal: Negative.   Skin:       Skin sensitivity   Neurological: Negative.     Per HPI unless specifically indicated above     Objective:    BP 103/70   Pulse 78   Temp 98.5 F (36.9 C) (Oral)   Wt 163 lb 8 oz (74.2 kg)   LMP  (LMP Unknown)   SpO2 98%   BMI 27.21 kg/m   Wt Readings  from Last 3 Encounters:  05/31/17 163 lb 8 oz (74.2 kg)  04/13/17 172 lb 9.9 oz (78.3 kg)  04/02/17 172 lb 9.9 oz (78.3 kg)    Physical Exam  Constitutional: She is oriented to person, place, and time. She appears well-developed and well-nourished. No distress.  HENT:  Head: Normocephalic and atraumatic.  Eyes: Conjunctivae and lids are normal. Right eye exhibits no discharge. Left eye exhibits no discharge. No scleral icterus.  Neck: Normal range of motion. Neck supple. No JVD present. Carotid bruit is not present.  Cardiovascular: Normal rate, regular rhythm and normal heart sounds.  Pulmonary/Chest: Effort normal and breath sounds normal.  Abdominal: Soft. Normal appearance and bowel sounds are normal. She exhibits no distension and no mass. There is no splenomegaly or hepatomegaly. There is no tenderness. There is no rebound and no guarding.  Musculoskeletal: Normal range of motion.  Neurological: She is alert and oriented to person, place, and time.  Skin: Skin is warm, dry and intact. No rash noted. No pallor.  Psychiatric: She has a normal mood and affect. Her behavior is normal. Judgment and thought content normal.    Results for orders placed or performed during  the hospital encounter of 04/02/17  Surgical pcr screen  Result Value Ref Range   MRSA, PCR NEGATIVE NEGATIVE   Staphylococcus aureus NEGATIVE NEGATIVE  Basic metabolic panel  Result Value Ref Range   Sodium 138 135 - 145 mmol/L   Potassium 3.6 3.5 - 5.1 mmol/L   Chloride 102 101 - 111 mmol/L   CO2 24 22 - 32 mmol/L   Glucose, Bld 107 (H) 65 - 99 mg/dL   BUN 16 6 - 20 mg/dL   Creatinine, Ser 0.86 0.44 - 1.00 mg/dL   Calcium 9.2 8.9 - 10.3 mg/dL   GFR calc non Af Amer >60 >60 mL/min   GFR calc Af Amer >60 >60 mL/min   Anion gap 12 5 - 15  CBC  Result Value Ref Range   WBC 7.1 4.0 - 10.5 K/uL   RBC 3.57 (L) 3.87 - 5.11 MIL/uL   Hemoglobin 11.8 (L) 12.0 - 15.0 g/dL   HCT 36.4 36.0 - 46.0 %   MCV 102.0 (H)  78.0 - 100.0 fL   MCH 33.1 26.0 - 34.0 pg   MCHC 32.4 30.0 - 36.0 g/dL   RDW 12.7 11.5 - 15.5 %   Platelets 193 150 - 400 K/uL      Assessment & Plan:   Problem List Items Addressed This Visit      Unprioritized   Chronic pain syndrome (Chronic)    Pt is back with me for pain management. Will rx for Hydrocodone #60 BID as that was her previous dose before failed hip.  Will rx #56 and see back 4 weeks and renew pain contract.        Essential hypertension    Stop Losartan due to lower than normal BP and feelings of presyncope      Relevant Medications   aspirin EC 81 MG tablet   Midline thoracic back pain (tertiary) (Chronic)   Relevant Medications   aspirin EC 81 MG tablet   HYDROcodone-acetaminophen (NORCO/VICODIN) 5-325 MG tablet   Other Relevant Orders   DG Thoracic Spine W/Swimmers    Other Visit Diagnoses    Gastroenteritis    -  Primary   New problem.  Discussed slow refeeding.  Fluids.  Rx for Zofran to help with nausea.  Omeprazole for acid burps      Unable to draw a CMP as lab closed  Follow up plan: Return in about 4 weeks (around 06/28/2017).

## 2017-06-02 ENCOUNTER — Ambulatory Visit
Admission: RE | Admit: 2017-06-02 | Discharge: 2017-06-02 | Disposition: A | Discharge status: Home / Self Care | Payer: Medicare Other | Source: Ambulatory Visit | Attending: Unknown Physician Specialty | Admitting: Unknown Physician Specialty

## 2017-06-02 DIAGNOSIS — M47814 Spondylosis without myelopathy or radiculopathy, thoracic region: Secondary | ICD-10-CM | POA: Diagnosis not present

## 2017-06-02 DIAGNOSIS — M546 Pain in thoracic spine: Secondary | ICD-10-CM | POA: Insufficient documentation

## 2017-06-03 DIAGNOSIS — M25552 Pain in left hip: Secondary | ICD-10-CM | POA: Diagnosis not present

## 2017-06-03 DIAGNOSIS — M6281 Muscle weakness (generalized): Secondary | ICD-10-CM | POA: Diagnosis not present

## 2017-06-03 DIAGNOSIS — R262 Difficulty in walking, not elsewhere classified: Secondary | ICD-10-CM | POA: Diagnosis not present

## 2017-06-03 DIAGNOSIS — M25652 Stiffness of left hip, not elsewhere classified: Secondary | ICD-10-CM | POA: Diagnosis not present

## 2017-06-07 ENCOUNTER — Encounter: Payer: Self-pay | Admitting: Unknown Physician Specialty

## 2017-06-07 ENCOUNTER — Emergency Department
Admission: EM | Admit: 2017-06-07 | Discharge: 2017-06-07 | Disposition: A | Discharge status: Home / Self Care | Payer: Medicare Other | Attending: Emergency Medicine | Admitting: Emergency Medicine

## 2017-06-07 ENCOUNTER — Other Ambulatory Visit: Payer: Self-pay | Admitting: Unknown Physician Specialty

## 2017-06-07 ENCOUNTER — Encounter: Payer: Self-pay | Admitting: Emergency Medicine

## 2017-06-07 ENCOUNTER — Emergency Department: Payer: Medicare Other

## 2017-06-07 ENCOUNTER — Ambulatory Visit (INDEPENDENT_AMBULATORY_CARE_PROVIDER_SITE_OTHER): Payer: Medicare Other | Admitting: Unknown Physician Specialty

## 2017-06-07 ENCOUNTER — Other Ambulatory Visit: Payer: Self-pay

## 2017-06-07 VITALS — BP 146/76 | HR 78 | Temp 98.3°F | Wt 170.6 lb

## 2017-06-07 DIAGNOSIS — R630 Anorexia: Secondary | ICD-10-CM | POA: Diagnosis not present

## 2017-06-07 DIAGNOSIS — K5900 Constipation, unspecified: Secondary | ICD-10-CM | POA: Diagnosis not present

## 2017-06-07 DIAGNOSIS — R112 Nausea with vomiting, unspecified: Secondary | ICD-10-CM | POA: Diagnosis not present

## 2017-06-07 DIAGNOSIS — Z96642 Presence of left artificial hip joint: Secondary | ICD-10-CM | POA: Insufficient documentation

## 2017-06-07 DIAGNOSIS — I10 Essential (primary) hypertension: Secondary | ICD-10-CM | POA: Insufficient documentation

## 2017-06-07 DIAGNOSIS — R1030 Lower abdominal pain, unspecified: Secondary | ICD-10-CM | POA: Insufficient documentation

## 2017-06-07 DIAGNOSIS — K5732 Diverticulitis of large intestine without perforation or abscess without bleeding: Secondary | ICD-10-CM | POA: Diagnosis not present

## 2017-06-07 DIAGNOSIS — R1032 Left lower quadrant pain: Secondary | ICD-10-CM | POA: Diagnosis not present

## 2017-06-07 LAB — COMPREHENSIVE METABOLIC PANEL
ALK PHOS: 99 U/L (ref 38–126)
ALT: 23 U/L (ref 14–54)
ANION GAP: 13 (ref 5–15)
AST: 22 U/L (ref 15–41)
Albumin: 4.4 g/dL (ref 3.5–5.0)
BUN: 13 mg/dL (ref 6–20)
CALCIUM: 9.5 mg/dL (ref 8.9–10.3)
CO2: 24 mmol/L (ref 22–32)
CREATININE: 0.83 mg/dL (ref 0.44–1.00)
Chloride: 102 mmol/L (ref 101–111)
Glucose, Bld: 124 mg/dL — ABNORMAL HIGH (ref 65–99)
Potassium: 3.3 mmol/L — ABNORMAL LOW (ref 3.5–5.1)
SODIUM: 139 mmol/L (ref 135–145)
Total Bilirubin: 1 mg/dL (ref 0.3–1.2)
Total Protein: 7.5 g/dL (ref 6.5–8.1)

## 2017-06-07 LAB — CBC
HEMATOCRIT: 34.5 % — AB (ref 35.0–47.0)
Hemoglobin: 11.7 g/dL — ABNORMAL LOW (ref 12.0–16.0)
MCH: 31.4 pg (ref 26.0–34.0)
MCHC: 33.8 g/dL (ref 32.0–36.0)
MCV: 92.9 fL (ref 80.0–100.0)
PLATELETS: 293 10*3/uL (ref 150–440)
RBC: 3.71 MIL/uL — ABNORMAL LOW (ref 3.80–5.20)
RDW: 14.2 % (ref 11.5–14.5)
WBC: 10.7 10*3/uL (ref 3.6–11.0)

## 2017-06-07 LAB — URINALYSIS, COMPLETE (UACMP) WITH MICROSCOPIC
BILIRUBIN URINE: NEGATIVE
GLUCOSE, UA: NEGATIVE mg/dL
HGB URINE DIPSTICK: NEGATIVE
Ketones, ur: NEGATIVE mg/dL
LEUKOCYTES UA: NEGATIVE
NITRITE: NEGATIVE
PH: 6 (ref 5.0–8.0)
Protein, ur: NEGATIVE mg/dL
SPECIFIC GRAVITY, URINE: 1.008 (ref 1.005–1.030)

## 2017-06-07 LAB — CBC WITH DIFFERENTIAL/PLATELET
HEMOGLOBIN: 11.2 g/dL (ref 11.1–15.9)
Hematocrit: 34 % (ref 34.0–46.6)
LYMPHS ABS: 2 10*3/uL (ref 0.7–3.1)
LYMPHS: 23 %
MCH: 32 pg (ref 26.6–33.0)
MCHC: 32.9 g/dL (ref 31.5–35.7)
MCV: 97 fL (ref 79–97)
MID (Absolute): 1 10*3/uL (ref 0.1–1.6)
MID: 12 %
NEUTROS ABS: 5.7 10*3/uL (ref 1.4–7.0)
NEUTROS PCT: 66 %
Platelets: 236 10*3/uL (ref 150–379)
RBC: 3.5 x10E6/uL — ABNORMAL LOW (ref 3.77–5.28)
RDW: 13.3 % (ref 12.3–15.4)
WBC: 8.7 10*3/uL (ref 3.4–10.8)

## 2017-06-07 LAB — LIPASE, BLOOD: LIPASE: 33 U/L (ref 11–51)

## 2017-06-07 MED ORDER — LACTULOSE 10 G PO PACK: 20.0000 g | PACK | Freq: Two times a day (BID) | ORAL | 0 refills | Status: DC | PRN

## 2017-06-07 MED ORDER — IOPAMIDOL (ISOVUE-300) INJECTION 61%
30.0000 mL | Freq: Once | INTRAVENOUS | Status: AC | PRN
  Administered 2017-06-07: 30 mL via ORAL

## 2017-06-07 MED ORDER — SENNOSIDES-DOCUSATE SODIUM 8.6-50 MG PO TABS: 2.0000 | ORAL_TABLET | Freq: Every day | ORAL | 0 refills | Status: DC | PRN

## 2017-06-07 MED ORDER — ONDANSETRON HCL 4 MG/2ML IJ SOLN
4.0000 mg | Freq: Once | INTRAMUSCULAR | Status: AC
  Administered 2017-06-07: 4 mg via INTRAVENOUS
  Filled 2017-06-07: qty 2

## 2017-06-07 MED ORDER — FLEET ENEMA 7-19 GM/118ML RE ENEM: 1.0000 | ENEMA | Freq: Once | RECTAL | Status: DC

## 2017-06-07 MED ORDER — HYDROCODONE-ACETAMINOPHEN 5-325 MG PO TABS: 1.0000 | ORAL_TABLET | Freq: Four times a day (QID) | ORAL | 0 refills | Status: DC | PRN

## 2017-06-07 MED ORDER — SODIUM CHLORIDE 0.9 % IV BOLUS (SEPSIS)
1000.0000 mL | Freq: Once | INTRAVENOUS | Status: AC
  Administered 2017-06-07: 1000 mL via INTRAVENOUS

## 2017-06-07 MED ORDER — FENTANYL CITRATE (PF) 100 MCG/2ML IJ SOLN
50.0000 ug | Freq: Once | INTRAMUSCULAR | Status: AC
  Administered 2017-06-07: 50 ug via INTRAVENOUS
  Filled 2017-06-07: qty 2

## 2017-06-07 MED ORDER — IOPAMIDOL (ISOVUE-300) INJECTION 61%
100.0000 mL | Freq: Once | INTRAVENOUS | Status: AC | PRN
  Administered 2017-06-07: 100 mL via INTRAVENOUS

## 2017-06-07 MED ORDER — METRONIDAZOLE 500 MG PO TABS: 500.0000 mg | ORAL_TABLET | Freq: Two times a day (BID) | ORAL | 0 refills | Status: DC

## 2017-06-07 MED ORDER — METRONIDAZOLE 500 MG PO TABS
500.0000 mg | ORAL_TABLET | Freq: Once | ORAL | Status: AC
  Administered 2017-06-07: 500 mg via ORAL
  Filled 2017-06-07: qty 1

## 2017-06-07 MED ORDER — LACTULOSE 10 GM/15ML PO SOLN
30.0000 g | Freq: Once | ORAL | Status: AC
  Administered 2017-06-07: 30 g via ORAL
  Filled 2017-06-07: qty 60

## 2017-06-07 NOTE — ED Notes (Signed)
Pt in CT.

## 2017-06-07 NOTE — ED Provider Notes (Signed)
[facility] Emergency Department Provider Note  ____________________________________________  Time seen: Approximately 6:08 PM  I have reviewed the triage vital signs and the nursing notes.   HISTORY  Chief Complaint Abdominal Pain and Constipation    HPI Tamara Fleming is a 72 y.o. female with a history of HTN, no history of abdominal surgery, presenting with constipation.  The patient reports that on 2/20, she had 24 hours of a profuse amount of nausea vomiting and diarrhea, which has completely resolved.  Since then, she has had a decreased appetite, and constipation.  She has tried Colace and MiraLAX without any improvement.  Yesterday she tried an enema and did not have any result.  She has had decreased or no flatus over the last couple of days.  She describes lower diffuse abdominal cramping without fever or chills.  The pain is worse with ambulation.  No urinary symptoms.  Past Medical History:  Diagnosis Date  . Carpal tunnel syndrome 05/09/2014   Right  . Chest pain   . Chronic low back pain   . Complication of anesthesia    small mouth  . Fatigue   . GERD (gastroesophageal reflux disease)    occ  . Gout   . Heart murmur   . Hiatal hernia   . Hip pain    Left  . Hyperlipidemia   . Hypertension   . OA (osteoarthritis)   . Peripheral vascular disease ([place])    plhebitis 20 yrs ago    Patient Active Problem List   Diagnosis Date Noted  . Primary osteoarthritis of left hip 03/23/2017  . Arthritis of left hip 02/17/2017  . Piriformis syndrome of right side 02/17/2017  . Diverticulitis 02/17/2017  . B12 deficiency 12/08/2016  . Long term current use of opiate analgesic 09/30/2016  . Chronic pain syndrome 09/30/2016  . Opiate use 09/30/2016  . Long term prescription opiate use 09/30/2016  . Low back pain (secondary) bilateral (right greater than left) 09/30/2016  . Midline thoracic back pain (tertiary) 09/30/2016  . Pedal edema 09/29/2016   . Primary osteoarthritis of first carpometacarpal joint of left hand 09/10/2016  . Psoriasis, unspecified 09/10/2016  . Sacral pain 09/10/2016  . Medication monitoring encounter 08/04/2016  . Essential hypertension 06/04/2015  . Familial multiple lipoprotein-type hyperlipidemia 06/04/2015  . Hip pain 06/04/2015  . IFG (impaired fasting glucose) 06/04/2015  . Pure hypercholesterolemia 05/08/2015  . Carpal tunnel syndrome 05/09/2014  . Palpitations 09/10/2011  . Chest pain with high risk for cardiac etiology 10/20/2010  . Dyslipidemia 10/20/2010  . Osteoarthrosis 10/20/2010  . Fatigue 10/20/2010  . Malaise 10/20/2010    Past Surgical History:  Procedure Laterality Date  . ABDOMINAL HYSTERECTOMY  1982   Partial  . APPENDECTOMY    . ARM SURGERY Left    OF THE ULNA NERVE  . BREAST LUMPECTOMY Right    BREAST TUMOR REMOVAL  . CARDIOVASCULAR STRESS TEST  08/11/2006   NORMAL. EF 55-60%  . CARDIOVASCULAR STRESS TEST  10/31/1998   EF 60% AT REST TO 75-80% POST EXERCISE  . CARPAL TUNNEL RELEASE Right 2015  . ESOPHAGOGASTRODUODENOSCOPY  05/16/1990  . ESOPHAGOGASTRODUODENOSCOPY (EGD) WITH PROPOFOL N/A 02/04/2015   Procedure: ESOPHAGOGASTRODUODENOSCOPY (EGD) WITH PROPOFOL;  Surgeon: Hulen Luster, MD;  Location: [facility] ENDOSCOPY;  Service: Gastroenterology;  Laterality: N/A;  . JOINT REPLACEMENT    . REDUCTION MAMMAPLASTY  06/11/15  . TOTAL HIP ARTHROPLASTY Left 04/13/2017   Procedure: TOTAL LEFT HIP ARTHROPLASTY ANTERIOR APPROACH;  Surgeon: Edmonia Lynch  D, MD;  Location: [facility];  Service: Orthopedics;  Laterality: Left;    Current Outpatient Rx  . Order #: [id number] Class: Print  . Order #: [id number] Class: Historical Med  . Order #: [id number] Class: Normal  . Order #: [id number] Class: Print  . Order #: [id number] Class: Historical Med  . Order #: [id number] Class: Print  . Order #: [id number] Class: Print  . Order #: [id number] Class: Normal  . Order #: [id number] Class: Print  . Order #:  [id number] Class: Normal  . Order #: [id number] Class: Normal  . Order #: [id number] Class: Print    Allergies Ciprofloxacin; Floxin [ofloxacin]; and Sulfa antibiotics  Family History  Problem Relation Age of Onset  . Coronary artery disease Mother   . Cancer Father        Lung    Social History Social History   Tobacco Use  . Smoking status: Never Smoker  . Smokeless tobacco: Never Used  Substance Use Topics  . Alcohol use: Yes    Alcohol/week: 8.4 oz    Types: 14 Glasses of wine per week    Comment: wine, daily  . Drug use: No    Review of Systems Constitutional: No fever/chills.  No lightheadedness or syncope. Eyes: No visual changes. ENT: No sore throat. No congestion or rhinorrhea. Cardiovascular: Denies chest pain. Denies palpitations. Respiratory: Denies shortness of breath.  No cough. Gastrointestinal: Positive diffuse lower abdominal pain.  Positive decreased appetite.  No nausea, no vomiting.  No diarrhea.  Positive decreased flatus with constipation. Genitourinary: Negative for dysuria. Musculoskeletal: Negative for back pain. Skin: Negative for rash. Neurological: Negative for headaches. No focal numbness, tingling or weakness.     ____________________________________________   PHYSICAL EXAM:  VITAL SIGNS: ED Triage Vitals  Enc Vitals Group     BP 06/07/17 1626 (!) 146/63     Pulse Rate 06/07/17 1626 82     Resp 06/07/17 1626 20     Temp 06/07/17 1626 98.5 F (36.9 C)     Temp Source 06/07/17 1626 Oral     SpO2 06/07/17 1626 100 %     Weight 06/07/17 1628 170 lb (77.1 kg)     Height 06/07/17 1628 5\' 5"  (1.651 m)     Head Circumference --      Peak Flow --      Pain Score 06/07/17 1627 7     Pain Loc --      Pain Edu? --      Excl. in [place]? --     Constitutional: Alert and oriented. Well appearing and in no acute distress. Answers questions appropriately. Eyes: Conjunctivae are normal.  EOMI. No scleral icterus. Head: Atraumatic. Nose: No  congestion/rhinnorhea. Mouth/Throat: Mucous membranes are moist.  Neck: No stridor.  Supple.  No JVD. Cardiovascular: Normal rate, regular rhythm. No murmurs, rubs or gallops.  Respiratory: Normal respiratory effort.  No accessory muscle use or retractions. Lungs CTAB.  No wheezes, rales or ronchi. Gastrointestinal: Soft, and minimally distended.  The patient has diffuse tenderness to palpation equal in the right lower and left lower quadrant without any significant suprapubic tenderness to palpation..  No guarding or rebound.  No peritoneal signs. Musculoskeletal: No LE edema. No ttp in the calves or palpable cords.  Negative Homan's sign. Neurologic:  A&Ox3.  Speech is clear.  Face and smile are symmetric.  EOMI.  Moves all extremities well. Skin:  Skin is warm, dry and intact. No rash noted. Psychiatric: Mood and affect are normal. Speech and behavior are normal.  Normal judgement.  ____________________________________________  LABS (all labs ordered are listed, but only abnormal results are displayed)  Labs Reviewed  COMPREHENSIVE METABOLIC PANEL - Abnormal; Notable for the following components:      Result Value   Potassium 3.3 (*)    Glucose, Bld 124 (*)    All other components within normal limits  URINALYSIS, COMPLETE (UACMP) WITH MICROSCOPIC - Abnormal; Notable for the following components:   Color, Urine YELLOW (*)    APPearance CLEAR (*)    Bacteria, UA RARE (*)    Squamous Epithelial / LPF 0-5 (*)    All other components within normal limits  CBC - Abnormal; Notable for the following components:   RBC 3.71 (*)    Hemoglobin 11.7 (*)    HCT 34.5 (*)    All other components within normal limits  URINE CULTURE  LIPASE, BLOOD   ____________________________________________  EKG  Not indicated ____________________________________________  RADIOLOGY  Ct Abdomen Pelvis W Contrast  Result Date: 06/07/2017 CLINICAL DATA:  Low abdominal pain, no bowel movement for 12  days EXAM: CT ABDOMEN AND PELVIS WITH CONTRAST TECHNIQUE: Multidetector CT imaging of the abdomen and pelvis was performed using the standard protocol following bolus administration of intravenous contrast. CONTRAST:  129mL ISOVUE-300 IOPAMIDOL (ISOVUE-300) INJECTION 61% COMPARISON:  CT 09/15/2016, 09/01/2012 FINDINGS: Lower chest: Lung bases demonstrate no acute consolidation or pleural effusion. Normal heart size. Small distal esophageal hiatal hernia. Hepatobiliary: No focal liver abnormality is seen. No gallstones, gallbladder wall thickening, or biliary dilatation. Pancreas: Unremarkable. No pancreatic ductal dilatation or surrounding inflammatory changes. Spleen: Normal in size without focal abnormality. Adrenals/Urinary Tract: Adrenal glands are unremarkable. Kidneys are normal, without renal calculi, focal lesion, or hydronephrosis. Bladder is unremarkable. Stomach/Bowel: Stomach is nonenlarged. No dilated small bowel. Colon diverticular disease. Mild wall thickening and adjacent inflammation at the sigmoid colon. No extraluminal gas. Nonvisualized appendix, presumably corresponding to history of appendectomy. Vascular/Lymphatic: Moderate aortic atherosclerosis. No aneurysmal dilatation. No significantly enlarged lymph nodes. Reproductive: Status post hysterectomy. Stable 1.9 cm right adnexal cyst. Other: Negative for free air or free fluid. Small fat in the umbilical region. Musculoskeletal: Degenerative changes. No acute or suspicious lesion. Status post left hip replacement with associated artifact. IMPRESSION: 1. Mild focal wall thickening and associated inflammatory changes at the sigmoid:, Felt to represent mild acute sigmoid colon diverticulitis. No perforation or abscess. Large volume of stool throughout the colon. No evidence for bowel obstruction. 2. Small hiatal hernia 3. Stable to slight decrease in size of small adnexal cysts. Electronically Signed   By: Donavan Foil M.D.   On: 06/07/2017 19:36     ____________________________________________   PROCEDURES  Procedure(s) performed: None  Procedures  Critical Care performed: No ____________________________________________   INITIAL IMPRESSION / ASSESSMENT AND PLAN / ED COURSE  Pertinent labs & imaging results that were available during my care of the patient were reviewed by me and considered in my medical decision making (see chart for details).  72 y.o. female with a native abdomen presenting with constipation and decreased flatus, decreased appetite since 2/20.  Overall, the patient is hemodynamically stable and afebrile.  She has lower abdominal pain without focal findings; a CT scan has been ordered to evaluate for partial small bowel obstruction, small bowel obstruction, diverticulitis, or appendicitis.  She does have some bacteriuria, and a urine culture has been sent.  If no other findings are present, it is possible that the UTI could be causing some slow GI transit.  In addition, the patient underwent hip surgery in January  and has occasionally been using hydrocodone for her pain control, which could be contributing to her constipation.  Plan reevaluation for final disposition.  ----------------------------------------- 8:05 PM on 06/07/2017 -----------------------------------------  The patient has continued to be hemodynamically stable.  She is feeling significantly better but still has not had a stool.  Her CT scan shows sigmoid diverticulitis, which she has had before and has successfully treated with monotherapy with Flagyl so we will start her on a 7-day course of that.  Her urine has been sent for culture and given the lack of symptoms and the fact the patient will already be on Flagyl for her diverticulitis, I will have her follow-up with her primary care physician to follow-up the culture.  I have offered the patient lactulose and enema here, but she prefers to do an enema at home.  At this time, the patient is  stable for discharge home.  I have discussed follow-up instructions as well as return precautions with the patient and her husband. ____________________________________________  FINAL CLINICAL IMPRESSION(S) / ED DIAGNOSES  Final diagnoses:  Sigmoid diverticulitis  Constipation, unspecified constipation type         NEW MEDICATIONS STARTED DURING THIS VISIT:  New Prescriptions   LACTULOSE ([place]) 10 G PACKET    Take 2 packets (20 g total) by mouth 2 (two) times daily as needed (constipation).   METRONIDAZOLE (FLAGYL) 500 MG TABLET    Take 1 tablet (500 mg total) by mouth 2 (two) times daily.   SENNA-DOCUSATE (SENOKOT-S) 8.6-50 MG TABLET    Take 2 tablets by mouth daily as needed for mild constipation.      Eula Listen, MD 06/07/17 2007

## 2017-06-07 NOTE — ED Notes (Signed)
Pt arrives with c/o of low abd pain. Pt tender to touch lower abd. Pt states no BM in 12 days after having a 24 hour GI bug of V&D. Denies N&V since. Only states small amount of liquid passed. Tried suppository at home. Alert, oriented. No distress noted. Hx of diverticulitis. States surgery in January and since has been taking narcotic pain medicine.

## 2017-06-07 NOTE — ED Notes (Signed)
Pt finished drinking contrast. Attempted to call CT with no answer x 2.

## 2017-06-07 NOTE — Discharge Instructions (Signed)
Please take a high-fiber diet, drink plenty of liquids, to prevent constipation.  Please also avoid any narcotic medications including hydrocodone.  Treat your hip pain without narcotics.  You may start a daily stool softener, and take lactulose if you are not successful with having a bowel movement.    Take the entire course of antibiotics, even if you are feeling better.  Return to the emergency department if you develop severe pain, lightheadedness or fainting, fever, inability to keep down fluids, or any other symptoms concerning to you.

## 2017-06-07 NOTE — Progress Notes (Signed)
BP (!) 146/76   Pulse 78   Temp 98.3 F (36.8 C) (Oral)   Wt 170 lb 9.6 oz (77.4 kg)   LMP  (LMP Unknown)   SpO2 99%   BMI 28.39 kg/m    Subjective:    Patient ID: Tamara Fleming, female    DOB: Nov 21, 1945, 72 y.o.   MRN: [id number]  HPI: Tamara Fleming is a 72 y.o. female  Chief Complaint  Patient presents with  . Constipation    pt states she has not had a BM in 12 days. States she feels like she needs to go but can't. States she has been trying miralax and an enema with no relief.    Constipation  This is a new problem. The current episode started today. The problem is unchanged. The patient is on a high fiber diet. She does not exercise regularly. There has been adequate water intake. Associated symptoms include abdominal pain and bloating. Pertinent negatives include no anorexia, back pain, diarrhea, difficulty urinating, fecal incontinence, fever, flatus, hematochezia, hemorrhoids, melena, nausea, rectal pain, vomiting or weight loss. Risk factors: opiates. She has tried enemas and laxatives (Tried Colace, miralax, and an enema) for the symptoms. The treatment provided no relief. (History of diverticulitis)    Relevant past medical, surgical, family and social history reviewed and updated as indicated. Interim medical history since our last visit reviewed. Allergies and medications reviewed and updated.  Review of Systems  Constitutional: Negative for fever and weight loss.  Gastrointestinal: Positive for abdominal pain, bloating and constipation. Negative for anorexia, diarrhea, flatus, hematochezia, hemorrhoids, melena, nausea, rectal pain and vomiting.  Genitourinary: Negative for difficulty urinating.  Musculoskeletal: Negative for back pain.    Per HPI unless specifically indicated above     Objective:    BP (!) 146/76   Pulse 78   Temp 98.3 F (36.8 C) (Oral)   Wt 170 lb 9.6 oz (77.4 kg)   LMP  (LMP Unknown)   SpO2 99%   BMI 28.39 kg/m   Wt Readings  from Last 3 Encounters:  06/07/17 170 lb 9.6 oz (77.4 kg)  05/31/17 163 lb 8 oz (74.2 kg)  04/13/17 172 lb 9.9 oz (78.3 kg)    Physical Exam  Constitutional: She is oriented to person, place, and time. She appears well-developed and well-nourished. No distress.  HENT:  Head: Normocephalic and atraumatic.  Eyes: Conjunctivae and lids are normal. Right eye exhibits no discharge. Left eye exhibits no discharge. No scleral icterus.  Neck: Normal range of motion. Neck supple. No JVD present. Carotid bruit is not present.  Cardiovascular: Normal rate, regular rhythm and normal heart sounds.  Pulmonary/Chest: Effort normal and breath sounds normal.  Abdominal: Normal appearance. There is no hepatosplenomegaly, splenomegaly or hepatomegaly. There is tenderness in the left lower quadrant. There is no rigidity, no rebound, no guarding, no CVA tenderness, no tenderness at McBurney's point and negative Murphy's sign.  Musculoskeletal: Normal range of motion.  Neurological: She is alert and oriented to person, place, and time.  Skin: Skin is warm, dry and intact. No rash noted. No pallor.  Psychiatric: She has a normal mood and affect. Her behavior is normal. Judgment and thought content normal.   WBC is 8.7  Results for orders placed or performed during the hospital encounter of 04/02/17  Surgical pcr screen  Result Value Ref Range   MRSA, PCR NEGATIVE NEGATIVE   Staphylococcus aureus NEGATIVE NEGATIVE  Basic metabolic panel  Result Value Ref Range  Sodium 138 135 - 145 mmol/L   Potassium 3.6 3.5 - 5.1 mmol/L   Chloride 102 101 - 111 mmol/L   CO2 24 22 - 32 mmol/L   Glucose, Bld 107 (H) 65 - 99 mg/dL   BUN 16 6 - 20 mg/dL   Creatinine, Ser 0.86 0.44 - 1.00 mg/dL   Calcium 9.2 8.9 - 10.3 mg/dL   GFR calc non Af Amer >60 >60 mL/min   GFR calc Af Amer >60 >60 mL/min   Anion gap 12 5 - 15  CBC  Result Value Ref Range   WBC 7.1 4.0 - 10.5 K/uL   RBC 3.57 (L) 3.87 - 5.11 MIL/uL   Hemoglobin  11.8 (L) 12.0 - 15.0 g/dL   HCT 36.4 36.0 - 46.0 %   MCV 102.0 (H) 78.0 - 100.0 fL   MCH 33.1 26.0 - 34.0 pg   MCHC 32.4 30.0 - 36.0 g/dL   RDW 12.7 11.5 - 15.5 %   Platelets 193 150 - 400 K/uL      Assessment & Plan:   Problem List Items Addressed This Visit    None    Visit Diagnoses    LLQ pain    -  Primary   Relevant Orders   CBC With Differential/Platelet   UA/M w/rflx Culture, Routine   Constipation, unspecified constipation type          Severe constipation.  Discussed taking  Magnesium Citrate.  2 enemas back to back.  If that doesn't help, needs to go to the ER.  After further discussion, pt elects to go to the ER now.   Follow up plan: No Follow-up on file.

## 2017-06-07 NOTE — ED Triage Notes (Signed)
States no BM x 12 days. States ib Feb 20th she had nausea, vomiting and diarrhea x 24 hours which she thought was a stomach virus. States she has not has bowel movement since then. States has been back to normal diet x 9 days. Abdominal pain x 3 days.

## 2017-06-09 LAB — URINE CULTURE

## 2017-06-15 DIAGNOSIS — M6281 Muscle weakness (generalized): Secondary | ICD-10-CM | POA: Diagnosis not present

## 2017-06-15 DIAGNOSIS — R262 Difficulty in walking, not elsewhere classified: Secondary | ICD-10-CM | POA: Diagnosis not present

## 2017-06-15 DIAGNOSIS — M25652 Stiffness of left hip, not elsewhere classified: Secondary | ICD-10-CM | POA: Diagnosis not present

## 2017-06-15 DIAGNOSIS — M25552 Pain in left hip: Secondary | ICD-10-CM | POA: Diagnosis not present

## 2017-06-23 DIAGNOSIS — M25552 Pain in left hip: Secondary | ICD-10-CM | POA: Diagnosis not present

## 2017-06-24 DIAGNOSIS — R262 Difficulty in walking, not elsewhere classified: Secondary | ICD-10-CM | POA: Diagnosis not present

## 2017-06-24 DIAGNOSIS — M25552 Pain in left hip: Secondary | ICD-10-CM | POA: Diagnosis not present

## 2017-06-24 DIAGNOSIS — M25652 Stiffness of left hip, not elsewhere classified: Secondary | ICD-10-CM | POA: Diagnosis not present

## 2017-06-24 DIAGNOSIS — M6281 Muscle weakness (generalized): Secondary | ICD-10-CM | POA: Diagnosis not present

## 2017-06-28 DIAGNOSIS — M6281 Muscle weakness (generalized): Secondary | ICD-10-CM | POA: Diagnosis not present

## 2017-06-28 DIAGNOSIS — R262 Difficulty in walking, not elsewhere classified: Secondary | ICD-10-CM | POA: Diagnosis not present

## 2017-06-28 DIAGNOSIS — M25552 Pain in left hip: Secondary | ICD-10-CM | POA: Diagnosis not present

## 2017-06-28 DIAGNOSIS — M25652 Stiffness of left hip, not elsewhere classified: Secondary | ICD-10-CM | POA: Diagnosis not present

## 2017-07-09 ENCOUNTER — Other Ambulatory Visit: Payer: Self-pay | Admitting: Unknown Physician Specialty

## 2017-07-09 MED ORDER — HYDROCODONE-ACETAMINOPHEN 5-325 MG PO TABS: 1.0000 | ORAL_TABLET | Freq: Four times a day (QID) | ORAL | 0 refills | Status: DC | PRN

## 2017-07-13 ENCOUNTER — Ambulatory Visit (INDEPENDENT_AMBULATORY_CARE_PROVIDER_SITE_OTHER): Payer: Medicare Other | Admitting: Unknown Physician Specialty

## 2017-07-13 ENCOUNTER — Encounter: Payer: Self-pay | Admitting: Unknown Physician Specialty

## 2017-07-13 VITALS — BP 124/79 | HR 69 | Temp 98.3°F | Ht 64.0 in | Wt 168.0 lb

## 2017-07-13 DIAGNOSIS — E785 Hyperlipidemia, unspecified: Secondary | ICD-10-CM

## 2017-07-13 DIAGNOSIS — G894 Chronic pain syndrome: Secondary | ICD-10-CM | POA: Diagnosis not present

## 2017-07-13 DIAGNOSIS — L03011 Cellulitis of right finger: Secondary | ICD-10-CM

## 2017-07-13 DIAGNOSIS — K5790 Diverticulosis of intestine, part unspecified, without perforation or abscess without bleeding: Secondary | ICD-10-CM

## 2017-07-13 DIAGNOSIS — K5792 Diverticulitis of intestine, part unspecified, without perforation or abscess without bleeding: Secondary | ICD-10-CM | POA: Diagnosis not present

## 2017-07-13 MED ORDER — CEPHALEXIN 250 MG PO CAPS: 250.0000 mg | ORAL_CAPSULE | Freq: Four times a day (QID) | ORAL | 0 refills | Status: DC

## 2017-07-13 MED ORDER — ATORVASTATIN CALCIUM 10 MG PO TABS: 10.0000 mg | ORAL_TABLET | Freq: Every day | ORAL | 1 refills | Status: DC

## 2017-07-13 MED ORDER — HYDROCODONE-ACETAMINOPHEN 5-325 MG PO TABS: 1.0000 | ORAL_TABLET | Freq: Four times a day (QID) | ORAL | 0 refills | Status: DC | PRN

## 2017-07-13 NOTE — Assessment & Plan Note (Signed)
Frequent bouts of diverticulitis.  Will refer to Dr. Fleet Contras for further management

## 2017-07-13 NOTE — Assessment & Plan Note (Signed)
Rx for Hydrocodone 5 mg BID on the 28 day schedule.

## 2017-07-13 NOTE — Assessment & Plan Note (Signed)
Not fasting today.  Schedule PE

## 2017-07-13 NOTE — Progress Notes (Signed)
BP 124/79   Pulse 69   Temp 98.3 F (36.8 C) (Oral)   Ht 5\' 4"  (1.626 m)   Wt 168 lb (76.2 kg)   LMP  (LMP Unknown)   SpO2 98%   BMI 28.84 kg/m    Subjective:    Patient ID: Tamara Fleming, female    DOB: 1946-02-13, 72 y.o.   MRN: [id number]  HPI: Tamara Fleming is a 72 y.o. female  Chief Complaint  Patient presents with  . Pain   Pain management Pt is here to discuss pain control.  She had her left hip replaced.  States it is doing well.  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  Upper back with OA.  Gout bothers her off and on.  She has tried Tramadol in the past without any help.  She is currently in PT.  Prior to surgery regular visitor at the chiropractor.   She is currently taking Hydrocodone 5-355 mg BID. Pt states pain medication helps her drive or ride in car, do housework, and sleep   Diverticulitis Last visit.  Pt had an ER visit for diverticulitis.  She is being seen in the ER at least twice a year.  We successfully managed her as an outpatient once.  Pt would like to discuss colectomy with a surgeon.    Needs Atorvastatin filled today.  Needs labs.     Relevant past medical, surgical, family and social history reviewed and updated as indicated. Interim medical history since our last visit reviewed. Allergies and medications reviewed and updated.  Review of Systems  Musculoskeletal:       Last joint of left ring finger swollen and red    Per HPI unless specifically indicated above     Objective:    BP 124/79   Pulse 69   Temp 98.3 F (36.8 C) (Oral)   Ht 5\' 4"  (1.626 m)   Wt 168 lb (76.2 kg)   LMP  (LMP Unknown)   SpO2 98%   BMI 28.84 kg/m   Wt Readings from Last 3 Encounters:  07/13/17 168 lb (76.2 kg)  06/07/17 170 lb (77.1 kg)  06/07/17 170 lb 9.6 oz (77.4 kg)    Physical Exam  Constitutional: She is oriented to person, place, and time. She appears well-developed and  well-nourished. No distress.  HENT:  Head: Normocephalic and atraumatic.  Eyes: Conjunctivae and lids are normal. Right eye exhibits no discharge. Left eye exhibits no discharge. No scleral icterus.  Cardiovascular: Normal rate.  Pulmonary/Chest: Effort normal.  Abdominal: Normal appearance. There is no splenomegaly or hepatomegaly.  Musculoskeletal: Normal range of motion.  Mild redness and swelling distal left ring finger  Neurological: She is alert and oriented to person, place, and time.  Skin: Skin is intact. No rash noted. No pallor.  Psychiatric: She has a normal mood and affect. Her behavior is normal. Judgment and thought content normal.    Results for orders placed or performed during the hospital encounter of 06/07/17  Urine culture  Result Value Ref Range   Specimen Description      URINE, RANDOM Performed at [facility], [address], [place], [place] [address]    Special Requests      NONE Performed at [facility], [address] [place], [place] [address]    Culture MULTIPLE SPECIES PRESENT, SUGGEST RECOLLECTION (A)    Report Status 06/09/2017 FINAL   Lipase,  blood  Result Value Ref Range   Lipase 33 11 - 51 U/L  Comprehensive metabolic panel  Result Value Ref Range   Sodium 139 135 - 145 mmol/L   Potassium 3.3 (L) 3.5 - 5.1 mmol/L   Chloride 102 101 - 111 mmol/L   CO2 24 22 - 32 mmol/L   Glucose, Bld 124 (H) 65 - 99 mg/dL   BUN 13 6 - 20 mg/dL   Creatinine, Ser 0.83 0.44 - 1.00 mg/dL   Calcium 9.5 8.9 - 10.3 mg/dL   Total Protein 7.5 6.5 - 8.1 g/dL   Albumin 4.4 3.5 - 5.0 g/dL   AST 22 15 - 41 U/L   ALT 23 14 - 54 U/L   Alkaline Phosphatase 99 38 - 126 U/L   Total Bilirubin 1.0 0.3 - 1.2 mg/dL   GFR calc non Af Amer >60 >60 mL/min   GFR calc Af Amer >60 >60 mL/min   Anion gap 13 5 - 15  Urinalysis, Complete w Microscopic  Result Value Ref Range   Color, Urine YELLOW (A) YELLOW   APPearance CLEAR (A) CLEAR   Specific  Gravity, Urine 1.008 1.005 - 1.030   pH 6.0 5.0 - 8.0   Glucose, UA NEGATIVE NEGATIVE mg/dL   Hgb urine dipstick NEGATIVE NEGATIVE   Bilirubin Urine NEGATIVE NEGATIVE   Ketones, ur NEGATIVE NEGATIVE mg/dL   Protein, ur NEGATIVE NEGATIVE mg/dL   Nitrite NEGATIVE NEGATIVE   Leukocytes, UA NEGATIVE NEGATIVE   RBC / HPF 0-5 0 - 5 RBC/hpf   WBC, UA 0-5 0 - 5 WBC/hpf   Bacteria, UA RARE (A) NONE SEEN   Squamous Epithelial / LPF 0-5 (A) NONE SEEN   Mucus PRESENT   CBC  Result Value Ref Range   WBC 10.7 3.6 - 11.0 K/uL   RBC 3.71 (L) 3.80 - 5.20 MIL/uL   Hemoglobin 11.7 (L) 12.0 - 16.0 g/dL   HCT 34.5 (L) 35.0 - 47.0 %   MCV 92.9 80.0 - 100.0 fL   MCH 31.4 26.0 - 34.0 pg   MCHC 33.8 32.0 - 36.0 g/dL   RDW 14.2 11.5 - 14.5 %   Platelets 293 150 - 440 K/uL      Assessment & Plan:   Problem List Items Addressed This Visit      Unprioritized   Chronic pain syndrome (Chronic)    Rx for Hydrocodone 5 mg BID on the 28 day schedule.        Diverticulitis    Frequent bouts of diverticulitis.  Will refer to Dr. Fleet Contras for further management      Diverticulosis - Primary   Relevant Orders   Ambulatory referral to General Surgery   Dyslipidemia    Not fasting today.  Schedule PE      Relevant Medications   atorvastatin (LIPITOR) 10 MG tablet    Other Visit Diagnoses    Paronychia of finger of right hand       swelling and redness.  supportive care.  If no improvement, antibiotics rx'd   Relevant Medications   cephALEXin (KEFLEX) 250 MG capsule      Follow up plan: Return for Schedule PE.

## 2017-07-22 ENCOUNTER — Telehealth: Payer: Self-pay | Admitting: Unknown Physician Specialty

## 2017-07-22 NOTE — Telephone Encounter (Signed)
Copied from [employer] [id number]. Topic: Quick Communication - Rx Refill/Question >> Jul 22, 2017  4:40 PM Selinda Flavin B, NT wrote: Medication: losartan-hydrochlorothiazide (HYZAAR) 100-12.5 MG tablet.  Has the patient contacted their pharmacy? Yes.   (Agent: If no, request that the patient contact the pharmacy for the refill.) Preferred Pharmacy (with phone number or street name): [place], [place]. Agent: Please be advised that RX refills may take up to 3 business days. We ask that you follow-up with your pharmacy.

## 2017-07-23 MED ORDER — LOSARTAN POTASSIUM-HCTZ 100-12.5 MG PO TABS: 1.0000 | ORAL_TABLET | Freq: Every day | ORAL | 3 refills | Status: DC

## 2017-08-04 DIAGNOSIS — M25552 Pain in left hip: Secondary | ICD-10-CM | POA: Diagnosis not present

## 2017-08-19 ENCOUNTER — Telehealth: Payer: Self-pay

## 2017-08-19 NOTE — Telephone Encounter (Signed)
Copied from [place] [id number]. Topic: Medicare AWV >> Aug 19, 2017  2:34 PM [place], [place] A, LPN wrote: Reason for CRM:  Called to schedule medicare annual wellness visit with NHA- Shaton Lore,LPN at [facility]. Please schedule anytime. Any questions please contact Joanette Gula on skype or by phone- [phone]

## 2017-08-25 ENCOUNTER — Ambulatory Visit (INDEPENDENT_AMBULATORY_CARE_PROVIDER_SITE_OTHER): Payer: Medicare Other | Admitting: Unknown Physician Specialty

## 2017-08-25 ENCOUNTER — Encounter: Payer: Self-pay | Admitting: Unknown Physician Specialty

## 2017-08-25 VITALS — BP 149/80 | HR 63 | Temp 97.7°F | Ht 64.0 in | Wt 171.0 lb

## 2017-08-25 DIAGNOSIS — F1190 Opioid use, unspecified, uncomplicated: Secondary | ICD-10-CM | POA: Diagnosis not present

## 2017-08-25 DIAGNOSIS — M1990 Unspecified osteoarthritis, unspecified site: Secondary | ICD-10-CM | POA: Diagnosis not present

## 2017-08-25 DIAGNOSIS — B303 Acute epidemic hemorrhagic conjunctivitis (enteroviral): Secondary | ICD-10-CM | POA: Diagnosis not present

## 2017-08-25 DIAGNOSIS — I10 Essential (primary) hypertension: Secondary | ICD-10-CM | POA: Diagnosis not present

## 2017-08-25 DIAGNOSIS — F419 Anxiety disorder, unspecified: Secondary | ICD-10-CM | POA: Diagnosis not present

## 2017-08-25 DIAGNOSIS — G894 Chronic pain syndrome: Secondary | ICD-10-CM | POA: Diagnosis not present

## 2017-08-25 DIAGNOSIS — R002 Palpitations: Secondary | ICD-10-CM | POA: Diagnosis not present

## 2017-08-25 DIAGNOSIS — R600 Localized edema: Secondary | ICD-10-CM | POA: Diagnosis not present

## 2017-08-25 DIAGNOSIS — R079 Chest pain, unspecified: Secondary | ICD-10-CM | POA: Diagnosis not present

## 2017-08-25 MED ORDER — ALPRAZOLAM 0.25 MG PO TABS: 0.2500 mg | ORAL_TABLET | Freq: Two times a day (BID) | ORAL | 0 refills | Status: DC | PRN

## 2017-08-25 MED ORDER — HYDROCODONE-ACETAMINOPHEN 5-325 MG PO TABS: 1.0000 | ORAL_TABLET | Freq: Four times a day (QID) | ORAL | 0 refills | Status: DC | PRN

## 2017-08-25 NOTE — Assessment & Plan Note (Signed)
Gave limited refill of Xanax as she is prepared to fly

## 2017-08-25 NOTE — Assessment & Plan Note (Signed)
Wrote Opiate rx's for 6/3, 7/1, 7/29 Pt understands those will be the last opiate rx's from this office.  Amy further rx's need to come from the pain clinic.  She understands and agrees to plan of care.

## 2017-08-25 NOTE — Progress Notes (Signed)
BP (!) 149/80   Pulse 63   Temp 97.7 F (36.5 C) (Oral)   Ht 5\' 4"  (1.626 m)   Wt 171 lb (77.6 kg)   LMP  (LMP Unknown)   SpO2 98%   BMI 29.35 kg/m    Subjective:    Patient ID: Tamara Fleming, female    DOB: [birth year], 72 y.o.   MRN: [id number]  HPI: Tamara Fleming is a 72 y.o. female  Chief Complaint  Patient presents with  . Eye Problem    Right eye, no pain, very red, first noticied today   Hemorrhagic conjunctivitis Pt is here for new and sudden onset of redness in her right eye.  Denies change in vision or pain.  No discharge or swelling.    Chronic pain  Pt is on chronic opiates for chronic pain issues related to DDD and generalized arthritis.  Base of left thumb particularly painful.  She has tried Tramadol in the past without any help. She has gout that bothers off and on.  Pain meds help her drive, sit in car, do housework, and sleep.    Relevant past medical, surgical, family and social history reviewed and updated as indicated. Interim medical history since our last visit reviewed. Allergies and medications reviewed and updated.  Review of Systems  Constitutional: Negative.   Respiratory: Negative.   Cardiovascular: Negative.   Psychiatric/Behavioral: Negative.     Per HPI unless specifically indicated above     Objective:    BP (!) 149/80   Pulse 63   Temp 97.7 F (36.5 C) (Oral)   Ht 5\' 4"  (1.626 m)   Wt 171 lb (77.6 kg)   LMP  (LMP Unknown)   SpO2 98%   BMI 29.35 kg/m   Wt Readings from Last 3 Encounters:  08/25/17 171 lb (77.6 kg)  07/13/17 168 lb (76.2 kg)  06/07/17 170 lb (77.1 kg)    Physical Exam  Constitutional: She is oriented to person, place, and time. She appears well-developed and well-nourished. No distress.  HENT:  Head: Normocephalic and atraumatic.  Eyes: Lids are normal. Right eye exhibits no discharge. Left eye exhibits no discharge. Right conjunctiva has a hemorrhage. No scleral icterus.  Cardiovascular: Normal  rate.  Pulmonary/Chest: Effort normal.  Abdominal: Normal appearance. There is no splenomegaly or hepatomegaly.  Musculoskeletal: Normal range of motion.  Neurological: She is alert and oriented to person, place, and time.  Skin: Skin is intact. No rash noted. No pallor.  Psychiatric: She has a normal mood and affect. Her behavior is normal. Judgment and thought content normal.    Results for orders placed or performed during the hospital encounter of 06/07/17  Urine culture  Result Value Ref Range   Specimen Description      URINE, RANDOM Performed at [facility], [address] [place], [place] [address]    Special Requests      NONE Performed at [facility], [address] [place], [place] [address]    Culture MULTIPLE SPECIES PRESENT, SUGGEST RECOLLECTION (A)    Report Status 06/09/2017 FINAL   Lipase, blood  Result Value Ref Range   Lipase 33 11 - 51 U/L  Comprehensive metabolic panel  Result Value Ref Range   Sodium 139 135 - 145 mmol/L   Potassium 3.3 (L) 3.5 - 5.1 mmol/L   Chloride 102 101 - 111 mmol/L   CO2 24 22 - 32 mmol/L   Glucose, Bld 124 (H) 65 - 99  mg/dL   BUN 13 6 - 20 mg/dL   Creatinine, Ser 0.83 0.44 - 1.00 mg/dL   Calcium 9.5 8.9 - 10.3 mg/dL   Total Protein 7.5 6.5 - 8.1 g/dL   Albumin 4.4 3.5 - 5.0 g/dL   AST 22 15 - 41 U/L   ALT 23 14 - 54 U/L   Alkaline Phosphatase 99 38 - 126 U/L   Total Bilirubin 1.0 0.3 - 1.2 mg/dL   GFR calc non Af Amer >60 >60 mL/min   GFR calc Af Amer >60 >60 mL/min   Anion gap 13 5 - 15  Urinalysis, Complete w Microscopic  Result Value Ref Range   Color, Urine YELLOW (A) YELLOW   APPearance CLEAR (A) CLEAR   Specific Gravity, Urine 1.008 1.005 - 1.030   pH 6.0 5.0 - 8.0   Glucose, UA NEGATIVE NEGATIVE mg/dL   Hgb urine dipstick NEGATIVE NEGATIVE   Bilirubin Urine NEGATIVE NEGATIVE   Ketones, ur NEGATIVE NEGATIVE mg/dL   Protein, ur NEGATIVE NEGATIVE mg/dL   Nitrite NEGATIVE NEGATIVE    Leukocytes, UA NEGATIVE NEGATIVE   RBC / HPF 0-5 0 - 5 RBC/hpf   WBC, UA 0-5 0 - 5 WBC/hpf   Bacteria, UA RARE (A) NONE SEEN   Squamous Epithelial / LPF 0-5 (A) NONE SEEN   Mucus PRESENT   CBC  Result Value Ref Range   WBC 10.7 3.6 - 11.0 K/uL   RBC 3.71 (L) 3.80 - 5.20 MIL/uL   Hemoglobin 11.7 (L) 12.0 - 16.0 g/dL   HCT 34.5 (L) 35.0 - 47.0 %   MCV 92.9 80.0 - 100.0 fL   MCH 31.4 26.0 - 34.0 pg   MCHC 33.8 32.0 - 36.0 g/dL   RDW 14.2 11.5 - 14.5 %   Platelets 293 150 - 440 K/uL      Assessment & Plan:   Problem List Items Addressed This Visit      Unprioritized   Anxiety    Gave limited refill of Xanax as she is prepared to fly      Relevant Medications   ALPRAZolam (XANAX) 0.25 MG tablet   Chronic pain syndrome - Primary (Chronic)   Relevant Orders   Ambulatory referral to Pain Clinic   Opiate use (Chronic)    Wrote Opiate rx's for 6/3, 7/1, 7/29 Pt understands those will be the last opiate rx's from this office.  Amy further rx's need to come from the pain clinic.  She understands and agrees to plan of care.       Osteoarthrosis    Multiple arthralgias.  Pt has seen Rheumatology before.  She plans on trying CBD oil      Relevant Medications   HYDROcodone-acetaminophen (NORCO/VICODIN) 5-325 MG tablet (Start on 09/06/2017)   HYDROcodone-acetaminophen (NORCO/VICODIN) 5-325 MG tablet (Start on 10/04/2017)   HYDROcodone-acetaminophen (NORCO/VICODIN) 5-325 MG tablet (Start on 11/01/2017)    Other Visit Diagnoses    Acute hemorrhagic conjunctivitis       Discussed pathophysiology.  Reassurance given       Follow up plan: Return if symptoms worsen or fail to improve.

## 2017-08-25 NOTE — Assessment & Plan Note (Signed)
Multiple arthralgias.  Pt has seen Rheumatology before.  She plans on trying CBD oil

## 2017-08-26 DIAGNOSIS — L820 Inflamed seborrheic keratosis: Secondary | ICD-10-CM | POA: Diagnosis not present

## 2017-08-26 DIAGNOSIS — L578 Other skin changes due to chronic exposure to nonionizing radiation: Secondary | ICD-10-CM | POA: Diagnosis not present

## 2017-08-26 DIAGNOSIS — L812 Freckles: Secondary | ICD-10-CM | POA: Diagnosis not present

## 2017-08-26 DIAGNOSIS — L570 Actinic keratosis: Secondary | ICD-10-CM | POA: Diagnosis not present

## 2017-08-27 ENCOUNTER — Other Ambulatory Visit: Payer: Self-pay | Admitting: Unknown Physician Specialty

## 2017-08-27 MED ORDER — HYDROCODONE-ACETAMINOPHEN 5-325 MG PO TABS: 1.0000 | ORAL_TABLET | Freq: Two times a day (BID) | ORAL | 0 refills | Status: DC | PRN

## 2017-09-03 ENCOUNTER — Telehealth: Payer: Self-pay | Admitting: Unknown Physician Specialty

## 2017-09-03 MED ORDER — HYDROCODONE-ACETAMINOPHEN 5-325 MG PO TABS: 1.0000 | ORAL_TABLET | Freq: Four times a day (QID) | ORAL | 0 refills | Status: DC | PRN

## 2017-09-03 NOTE — Telephone Encounter (Signed)
Copied from [place] [id number]. Topic: Quick Communication - See Telephone Encounter >> Sep 03, 2017  1:32 PM Synthia Innocent wrote: CRM for notification. See Telephone encounter for: 09/03/17. Need approval for early refill on HYDROcodone-acetaminophen (NORCO/VICODIN) 5-325 MG tablet, patient going out of town today

## 2017-09-03 NOTE — Telephone Encounter (Signed)
Patient called checking on the status of her hydrocodone stating it should be time for her to get sent to pharmacy.    thanks

## 2017-09-03 NOTE — Telephone Encounter (Signed)
It was due 6/3.  I changed it to 5/31 but the others will remain the same 28 day refill interval from 6/3

## 2017-09-08 DIAGNOSIS — R079 Chest pain, unspecified: Secondary | ICD-10-CM | POA: Diagnosis not present

## 2017-09-14 DIAGNOSIS — I10 Essential (primary) hypertension: Secondary | ICD-10-CM | POA: Diagnosis not present

## 2017-09-14 DIAGNOSIS — R079 Chest pain, unspecified: Secondary | ICD-10-CM | POA: Diagnosis not present

## 2017-09-14 DIAGNOSIS — E7849 Other hyperlipidemia: Secondary | ICD-10-CM | POA: Diagnosis not present

## 2017-09-15 ENCOUNTER — Ambulatory Visit: Payer: Medicare Other | Admitting: Unknown Physician Specialty

## 2017-09-24 ENCOUNTER — Telehealth: Payer: Self-pay

## 2017-09-24 NOTE — Telephone Encounter (Signed)
Copied from [place] [id number]. Topic: Medicare AWV >> Sep 24, 2017  1:43 PM [place], [place] A, LPN wrote: Reason for CRM: Called to schedule medicare annual wellness visit with NHA- Tiffany Hill,LPN at [facility]. Please schedule anytime. Can have on same day as Regino Schultze if preferred. Any questions please contact Joanette Gula on skype or by phone- [phone]

## 2017-10-01 DIAGNOSIS — E559 Vitamin D deficiency, unspecified: Secondary | ICD-10-CM | POA: Diagnosis not present

## 2017-10-01 DIAGNOSIS — L650 Telogen effluvium: Secondary | ICD-10-CM | POA: Diagnosis not present

## 2017-10-01 DIAGNOSIS — E569 Vitamin deficiency, unspecified: Secondary | ICD-10-CM | POA: Diagnosis not present

## 2017-10-01 DIAGNOSIS — L814 Other melanin hyperpigmentation: Secondary | ICD-10-CM | POA: Diagnosis not present

## 2017-10-01 DIAGNOSIS — L659 Nonscarring hair loss, unspecified: Secondary | ICD-10-CM | POA: Diagnosis not present

## 2017-10-12 ENCOUNTER — Ambulatory Visit: Payer: Medicare Other | Admitting: Unknown Physician Specialty

## 2017-10-18 DIAGNOSIS — L920 Granuloma annulare: Secondary | ICD-10-CM | POA: Diagnosis not present

## 2017-10-18 DIAGNOSIS — D1801 Hemangioma of skin and subcutaneous tissue: Secondary | ICD-10-CM | POA: Diagnosis not present

## 2017-10-18 DIAGNOSIS — L578 Other skin changes due to chronic exposure to nonionizing radiation: Secondary | ICD-10-CM | POA: Diagnosis not present

## 2017-10-18 DIAGNOSIS — L821 Other seborrheic keratosis: Secondary | ICD-10-CM | POA: Diagnosis not present

## 2017-10-18 DIAGNOSIS — L812 Freckles: Secondary | ICD-10-CM | POA: Diagnosis not present

## 2017-10-18 DIAGNOSIS — L400 Psoriasis vulgaris: Secondary | ICD-10-CM | POA: Diagnosis not present

## 2017-10-18 DIAGNOSIS — L650 Telogen effluvium: Secondary | ICD-10-CM | POA: Diagnosis not present

## 2017-10-18 DIAGNOSIS — D229 Melanocytic nevi, unspecified: Secondary | ICD-10-CM | POA: Diagnosis not present

## 2017-10-18 DIAGNOSIS — D2230 Melanocytic nevi of unspecified part of face: Secondary | ICD-10-CM | POA: Diagnosis not present

## 2017-10-18 DIAGNOSIS — D225 Melanocytic nevi of trunk: Secondary | ICD-10-CM | POA: Diagnosis not present

## 2017-10-18 DIAGNOSIS — Z1283 Encounter for screening for malignant neoplasm of skin: Secondary | ICD-10-CM | POA: Diagnosis not present

## 2017-10-18 DIAGNOSIS — L720 Epidermal cyst: Secondary | ICD-10-CM | POA: Diagnosis not present

## 2017-10-20 ENCOUNTER — Encounter: Payer: Self-pay | Admitting: Unknown Physician Specialty

## 2017-10-20 ENCOUNTER — Ambulatory Visit (INDEPENDENT_AMBULATORY_CARE_PROVIDER_SITE_OTHER): Payer: Medicare Other | Admitting: Unknown Physician Specialty

## 2017-10-20 ENCOUNTER — Other Ambulatory Visit: Payer: Self-pay

## 2017-10-20 VITALS — BP 137/81 | HR 75 | Temp 98.7°F | Ht 64.0 in | Wt 174.0 lb

## 2017-10-20 DIAGNOSIS — I10 Essential (primary) hypertension: Secondary | ICD-10-CM | POA: Diagnosis not present

## 2017-10-20 DIAGNOSIS — E7800 Pure hypercholesterolemia, unspecified: Secondary | ICD-10-CM | POA: Diagnosis not present

## 2017-10-20 DIAGNOSIS — R5382 Chronic fatigue, unspecified: Secondary | ICD-10-CM

## 2017-10-20 DIAGNOSIS — R7989 Other specified abnormal findings of blood chemistry: Secondary | ICD-10-CM | POA: Diagnosis not present

## 2017-10-20 DIAGNOSIS — F419 Anxiety disorder, unspecified: Secondary | ICD-10-CM

## 2017-10-20 DIAGNOSIS — G894 Chronic pain syndrome: Secondary | ICD-10-CM

## 2017-10-20 DIAGNOSIS — E559 Vitamin D deficiency, unspecified: Secondary | ICD-10-CM | POA: Diagnosis not present

## 2017-10-20 DIAGNOSIS — R002 Palpitations: Secondary | ICD-10-CM

## 2017-10-20 MED ORDER — ALPRAZOLAM 0.25 MG PO TABS: 0.2500 mg | ORAL_TABLET | Freq: Two times a day (BID) | ORAL | 0 refills | Status: DC | PRN

## 2017-10-20 NOTE — Assessment & Plan Note (Signed)
Bothered her last night.  Full cardiac w/u done recently.  Chronic problem that bothers her off and on

## 2017-10-20 NOTE — Assessment & Plan Note (Signed)
Recheck TSH in one week

## 2017-10-20 NOTE — Assessment & Plan Note (Signed)
Start taking 2000 IUs of Vit D3/day.  Can purchase OTC

## 2017-10-20 NOTE — Assessment & Plan Note (Addendum)
Requiring opioid management.  Discussed at last visit and again discussed that she will need to f/u with pain management.  Reiterated that no more opiate prescription from this office as indicated in last note.

## 2017-10-20 NOTE — Assessment & Plan Note (Signed)
Stable, continue present medications.   

## 2017-10-20 NOTE — Progress Notes (Signed)
BP 137/81   Pulse 75   Temp 98.7 F (37.1 C) (Oral)   Ht 5\' 4"  (1.626 m)   Wt 174 lb (78.9 kg)   LMP  (LMP Unknown)   SpO2 97%   BMI 29.87 kg/m    Subjective:    Patient ID: Tamara Fleming, female    DOB: Nov 26, 1945, 72 y.o.   MRN: [id number]  HPI: Tamara Fleming is a 72 y.o. female  Chief Complaint  Patient presents with  . Follow-up    pt states she wants to talk about blood work, low Vitamin D, and thyroid test high   Pt  States she went to dermatology to evaluated hair falling out.  In addition she is gaining weight and is fatigued.  Labs done at the dermatologist with normal results except for a low Vit D and a TSH of 5.69. She was told her weight gain, fatigue, and hair loss could be due to this level and asked to come here for further assessment.    Taking Biotin and Rogaine.  States Biotin use was previous to labs results.    Pain management Pt is upset about needing to go to the pain clinic.  She feels like she has been there before and "they didn't do anything."  Discussed that first visit they do not prescribe opioids.  Reviewed pain management notes and extensive w/u done.  Pt feels they did not get back to her with results  Palpitations Pt states that she was up last night with palpitations.  Recently had a stress test and stress echo.    Hypertension Using medications without difficulty.  Did not take today Average home BPs Not checking   No problems or lightheadedness No chest pain with exertion or shortness of breath No Edema  Hyperlipidemia Using medications without problems: No Muscle aches  Diet compliance:Exercise:  Health maintenance F/u for several health maintenance items.  Needs CMP and Lipid panel.  Needs mammogram.    Relevant past medical, surgical, family and social history reviewed and updated as indicated. Interim medical history since our last visit reviewed. Allergies and medications reviewed and updated.  Review of  Systems  Per HPI unless specifically indicated above     Objective:    BP 137/81   Pulse 75   Temp 98.7 F (37.1 C) (Oral)   Ht 5\' 4"  (1.626 m)   Wt 174 lb (78.9 kg)   LMP  (LMP Unknown)   SpO2 97%   BMI 29.87 kg/m   Wt Readings from Last 3 Encounters:  10/20/17 174 lb (78.9 kg)  08/25/17 171 lb (77.6 kg)  07/13/17 168 lb (76.2 kg)    Physical Exam  Constitutional: She is oriented to person, place, and time. She appears well-developed and well-nourished. No distress.  HENT:  Head: Normocephalic and atraumatic.  Eyes: Conjunctivae and lids are normal. Right eye exhibits no discharge. Left eye exhibits no discharge. No scleral icterus.  Cardiovascular: Normal rate.  Pulmonary/Chest: Effort normal.  Abdominal: Normal appearance. There is no splenomegaly or hepatomegaly.  Musculoskeletal: Normal range of motion.  Neurological: She is alert and oriented to person, place, and time.  Skin: Skin is intact. No rash noted. No pallor.  Psychiatric: She has a normal mood and affect. Her behavior is normal. Judgment and thought content normal.    Results for orders placed or performed during the hospital encounter of 06/07/17  Urine culture  Result Value Ref Range   Specimen Description  URINE, RANDOM Performed at [facility], [address] [place], [place] [address]    Special Requests      NONE Performed at [facility], [address] [place], [place] [address]    Culture MULTIPLE SPECIES PRESENT, SUGGEST RECOLLECTION (A)    Report Status 06/09/2017 FINAL   Lipase, blood  Result Value Ref Range   Lipase 33 11 - 51 U/L  Comprehensive metabolic panel  Result Value Ref Range   Sodium 139 135 - 145 mmol/L   Potassium 3.3 (L) 3.5 - 5.1 mmol/L   Chloride 102 101 - 111 mmol/L   CO2 24 22 - 32 mmol/L   Glucose, Bld 124 (H) 65 - 99 mg/dL   BUN 13 6 - 20 mg/dL   Creatinine, Ser 0.83 0.44 - 1.00 mg/dL   Calcium 9.5 8.9 - 10.3 mg/dL   Total Protein  7.5 6.5 - 8.1 g/dL   Albumin 4.4 3.5 - 5.0 g/dL   AST 22 15 - 41 U/L   ALT 23 14 - 54 U/L   Alkaline Phosphatase 99 38 - 126 U/L   Total Bilirubin 1.0 0.3 - 1.2 mg/dL   GFR calc non Af Amer >60 >60 mL/min   GFR calc Af Amer >60 >60 mL/min   Anion gap 13 5 - 15  Urinalysis, Complete w Microscopic  Result Value Ref Range   Color, Urine YELLOW (A) YELLOW   APPearance CLEAR (A) CLEAR   Specific Gravity, Urine 1.008 1.005 - 1.030   pH 6.0 5.0 - 8.0   Glucose, UA NEGATIVE NEGATIVE mg/dL   Hgb urine dipstick NEGATIVE NEGATIVE   Bilirubin Urine NEGATIVE NEGATIVE   Ketones, ur NEGATIVE NEGATIVE mg/dL   Protein, ur NEGATIVE NEGATIVE mg/dL   Nitrite NEGATIVE NEGATIVE   Leukocytes, UA NEGATIVE NEGATIVE   RBC / HPF 0-5 0 - 5 RBC/hpf   WBC, UA 0-5 0 - 5 WBC/hpf   Bacteria, UA RARE (A) NONE SEEN   Squamous Epithelial / LPF 0-5 (A) NONE SEEN   Mucus PRESENT   CBC  Result Value Ref Range   WBC 10.7 3.6 - 11.0 K/uL   RBC 3.71 (L) 3.80 - 5.20 MIL/uL   Hemoglobin 11.7 (L) 12.0 - 16.0 g/dL   HCT 34.5 (L) 35.0 - 47.0 %   MCV 92.9 80.0 - 100.0 fL   MCH 31.4 26.0 - 34.0 pg   MCHC 33.8 32.0 - 36.0 g/dL   RDW 14.2 11.5 - 14.5 %   Platelets 293 150 - 440 K/uL      Assessment & Plan:   Problem List Items Addressed This Visit      Unprioritized   Anxiety    Related to travel.  Refilled #20 Alprazolam to take prn      Relevant Medications   ALPRAZolam (XANAX) 0.25 MG tablet   Chronic pain syndrome - Primary (Chronic)    Requiring opioid management.  Discussed at last visit and again discussed that she will need to f/u with pain management.  Reiterated that no more opiate prescription from this office as indicated in last note.        Relevant Orders   Ambulatory referral to Pain Clinic   Elevated TSH    With borderline TSH.  Will recheck next week after stopping Biotin.  If continued high will treat due to symptoms      Relevant Orders   Thyroid Panel With TSH   Essential  hypertension    Stable, continue present  medications. \      Relevant Orders   Comprehensive metabolic panel   Fatigue    Recheck TSH in one week      Palpitations    Bothered her last night.  Full cardiac w/u done recently.  Chronic problem that bothers her off and on      Pure hypercholesterolemia   Relevant Orders   Lipid Panel Piccolo, Waived   Vitamin D deficiency    Start taking 2000 IUs of Vit D3/day.  Can purchase OTC         1 week for labs  Follow up plan: Return in about 3 months (around 01/20/2018).   And with lab results

## 2017-10-20 NOTE — Assessment & Plan Note (Addendum)
With borderline TSH.  Will recheck next week after stopping Biotin.  If continued high will treat due to symptoms

## 2017-10-20 NOTE — Assessment & Plan Note (Signed)
Related to travel.  Refilled #20 Alprazolam to take prn

## 2017-11-03 ENCOUNTER — Ambulatory Visit (HOSPITAL_BASED_OUTPATIENT_CLINIC_OR_DEPARTMENT_OTHER): Payer: Medicare Other | Admitting: Nurse Practitioner

## 2017-11-03 ENCOUNTER — Ambulatory Visit: Payer: Medicare Other | Attending: Nurse Practitioner | Admitting: Nurse Practitioner

## 2017-11-03 ENCOUNTER — Encounter: Payer: Self-pay | Admitting: Nurse Practitioner

## 2017-11-03 VITALS — BP 156/81 | HR 64 | Temp 98.2°F | Resp 16 | Ht 65.0 in | Wt 170.0 lb

## 2017-11-03 DIAGNOSIS — E559 Vitamin D deficiency, unspecified: Secondary | ICD-10-CM | POA: Diagnosis not present

## 2017-11-03 DIAGNOSIS — M25552 Pain in left hip: Secondary | ICD-10-CM | POA: Insufficient documentation

## 2017-11-03 DIAGNOSIS — G5600 Carpal tunnel syndrome, unspecified upper limb: Secondary | ICD-10-CM | POA: Diagnosis not present

## 2017-11-03 DIAGNOSIS — Z7901 Long term (current) use of anticoagulants: Secondary | ICD-10-CM | POA: Insufficient documentation

## 2017-11-03 DIAGNOSIS — E785 Hyperlipidemia, unspecified: Secondary | ICD-10-CM | POA: Diagnosis not present

## 2017-11-03 DIAGNOSIS — E7841 Elevated Lipoprotein(a): Secondary | ICD-10-CM | POA: Insufficient documentation

## 2017-11-03 DIAGNOSIS — G5701 Lesion of sciatic nerve, right lower limb: Secondary | ICD-10-CM | POA: Insufficient documentation

## 2017-11-03 DIAGNOSIS — E7800 Pure hypercholesterolemia, unspecified: Secondary | ICD-10-CM | POA: Insufficient documentation

## 2017-11-03 DIAGNOSIS — E538 Deficiency of other specified B group vitamins: Secondary | ICD-10-CM | POA: Insufficient documentation

## 2017-11-03 DIAGNOSIS — R5383 Other fatigue: Secondary | ICD-10-CM | POA: Insufficient documentation

## 2017-11-03 DIAGNOSIS — Z79899 Other long term (current) drug therapy: Secondary | ICD-10-CM | POA: Insufficient documentation

## 2017-11-03 DIAGNOSIS — Z9889 Other specified postprocedural states: Secondary | ICD-10-CM | POA: Insufficient documentation

## 2017-11-03 DIAGNOSIS — G894 Chronic pain syndrome: Secondary | ICD-10-CM | POA: Insufficient documentation

## 2017-11-03 DIAGNOSIS — K5790 Diverticulosis of intestine, part unspecified, without perforation or abscess without bleeding: Secondary | ICD-10-CM | POA: Insufficient documentation

## 2017-11-03 DIAGNOSIS — M549 Dorsalgia, unspecified: Secondary | ICD-10-CM | POA: Diagnosis not present

## 2017-11-03 DIAGNOSIS — M1990 Unspecified osteoarthritis, unspecified site: Secondary | ICD-10-CM | POA: Insufficient documentation

## 2017-11-03 DIAGNOSIS — M533 Sacrococcygeal disorders, not elsewhere classified: Secondary | ICD-10-CM | POA: Insufficient documentation

## 2017-11-03 DIAGNOSIS — F419 Anxiety disorder, unspecified: Secondary | ICD-10-CM | POA: Insufficient documentation

## 2017-11-03 DIAGNOSIS — L409 Psoriasis, unspecified: Secondary | ICD-10-CM | POA: Diagnosis not present

## 2017-11-03 DIAGNOSIS — R7301 Impaired fasting glucose: Secondary | ICD-10-CM | POA: Diagnosis not present

## 2017-11-03 DIAGNOSIS — Z7982 Long term (current) use of aspirin: Secondary | ICD-10-CM | POA: Insufficient documentation

## 2017-11-03 DIAGNOSIS — Z79891 Long term (current) use of opiate analgesic: Secondary | ICD-10-CM | POA: Insufficient documentation

## 2017-11-03 DIAGNOSIS — Z79811 Long term (current) use of aromatase inhibitors: Secondary | ICD-10-CM | POA: Insufficient documentation

## 2017-11-03 DIAGNOSIS — Z8249 Family history of ischemic heart disease and other diseases of the circulatory system: Secondary | ICD-10-CM | POA: Insufficient documentation

## 2017-11-03 DIAGNOSIS — I10 Essential (primary) hypertension: Secondary | ICD-10-CM | POA: Diagnosis not present

## 2017-11-03 DIAGNOSIS — R5381 Other malaise: Secondary | ICD-10-CM | POA: Insufficient documentation

## 2017-11-03 DIAGNOSIS — R002 Palpitations: Secondary | ICD-10-CM | POA: Diagnosis not present

## 2017-11-03 DIAGNOSIS — R946 Abnormal results of thyroid function studies: Secondary | ICD-10-CM | POA: Insufficient documentation

## 2017-11-03 DIAGNOSIS — R609 Edema, unspecified: Secondary | ICD-10-CM | POA: Insufficient documentation

## 2017-11-03 DIAGNOSIS — Z809 Family history of malignant neoplasm, unspecified: Secondary | ICD-10-CM | POA: Insufficient documentation

## 2017-11-03 DIAGNOSIS — Z881 Allergy status to other antibiotic agents status: Secondary | ICD-10-CM | POA: Insufficient documentation

## 2017-11-03 DIAGNOSIS — M19042 Primary osteoarthritis, left hand: Secondary | ICD-10-CM | POA: Diagnosis not present

## 2017-11-03 DIAGNOSIS — Z882 Allergy status to sulfonamides status: Secondary | ICD-10-CM | POA: Insufficient documentation

## 2017-11-03 NOTE — Progress Notes (Signed)
Safety precautions to be maintained throughout the outpatient stay will include: orient to surroundings, keep bed in low position, maintain call bell within reach at all times, provide assistance with transfer out of bed and ambulation.  

## 2017-11-03 NOTE — Progress Notes (Signed)
Patient's Name: Tamara Fleming  MRN: [id number]  Referring Provider: Kathrine Haddock, NP  DOB: [birth year]  PCP: Kathrine Haddock, NP  DOS: 11/03/2017  Note by: Dionisio David NP  Service setting: Ambulatory outpatient  Specialty: Interventional Pain Management  Location: [facility] (AMB) Pain Management Facility    Patient type: New Patient    Primary Reason(s) for Visit: Initial Patient Evaluation CC: Hip Pain (left s/p joint replacement an IT band injury.  right hip pain ) and Back Pain (lumbar L5 )  HPI  Ms. Ragan is a 72 y.o. year old, female patient, who comes today for an initial evaluation. She has Chest pain with high risk for cardiac etiology; Dyslipidemia; Osteoarthrosis; Fatigue; Palpitations; Carpal tunnel syndrome; Essential hypertension; Familial multiple lipoprotein-type hyperlipidemia; Hip pain; IFG (impaired fasting glucose); Medication monitoring encounter; Malaise; Pedal edema; Primary osteoarthritis of first carpometacarpal joint of left hand; Psoriasis, unspecified; Pure hypercholesterolemia; Sacral pain; Chronic pain syndrome; Opiate use; Low back pain (secondary) bilateral (right greater than left); Midline thoracic back pain (tertiary); B12 deficiency; Arthritis of left hip; Piriformis syndrome of right side; Diverticulitis; Primary osteoarthritis of left hip; Diverticulosis; Anxiety; Vitamin D deficiency; and Elevated TSH on their problem list.. Her primarily concern today is the Hip Pain (left s/p joint replacement an IT band injury.  right hip pain ) and Back Pain (lumbar L5 )  Pain Assessment: Location: Left Hip(se visit info for additional pain sites ) Radiating: down left leg  Onset: More than a month ago Duration: Chronic pain Quality: Discomfort, Constant, Sharp, Dull, Aching, Burning, Numbness(stinging.  left upper thigh is highly sensitive and painful after hip surgery ) Severity: 4 /10 (subjective, self-reported pain score)  Note: Reported level is compatible with  observation.                         When using our objective Pain Scale, levels between 6 and 10/10 are said to belong in an emergency room, as it progressively worsens from a 6/10, described as severely limiting, requiring emergency care not usually available at an outpatient pain management facility. At a 6/10 level, communication becomes difficult and requires great effort. Assistance to reach the emergency department may be required. Facial flushing and profuse sweating along with potentially dangerous increases in heart rate and blood pressure will be evident. Effect on ADL: acitivities are limited  Timing: Constant Modifying factors: medications  BP: (!) 156/81  HR: 64  Onset and Duration: Gradual and Present longer than 3 months Cause of pain: Unknown Severity: Getting worse, NAS-11 at its worse: 9/10, NAS-11 at its best: 9/10, NAS-11 now: 4/10 and NAS-11 on the average: 4/10 Timing: Morning, Afternoon, During activity or exercise and After activity or exercise Aggravating Factors: Bending, Climbing, Kneeling, Lifiting, Prolonged sitting, Prolonged standing and Squatting Alleviating Factors: Medications and Walking Associated Problems: Night-time cramps, Fatigue, Spasms, Sweating, Pain that wakes patient up and Pain that does not allow patient to sleep Quality of Pain: Aching, Annoying, Sharp, Stabbing, Throbbing and Tingling Previous Examinations or Tests: CT scan, MRI scan, X-rays, Orthopedic evaluation and Chiropractic evaluation Previous Treatments: Biofeedback, Physical Therapy, Stretching exercises and Trigger point injections  The patient comes into the clinics today for the first time for a chronic pain management evaluation.   Today I took the time to provide the patient with information regarding this pain practice. The patient was informed that the practice is divided into two sections: an interventional pain management section, as well as a completely separate  and distinct  medication management section. I explained that there are procedure days for interventional therapies, and evaluation days for follow-ups and medication management. Because of the amount of documentation required during both, they are kept separated. This means that there is the possibility that  may be scheduled for a procedure on one day, and medication management the next. I have also informed  that because of staffing and facility limitations, this practice will no longer take patients for medication management only. To illustrate the reasons for this, I gave the patient the example of surgeons, and how inappropriate it would be to refer a patient to his/her care, just to write for the post-surgical antibiotics on a surgery done by a different surgeon.   Because interventional pain management is part of the board-certified specialty for the doctors, the patient was informed that joining this practice means that they are open to any and all interventional therapies. I made it clear that this does not mean that they will be forced to have any procedures done. What this means is that I believe interventional therapies to be essential part of the diagnosis and proper management of chronic pain conditions. Therefore, patients not interested in these interventional alternatives will be better served under the care of a different practitioner.  The patient was also made aware of my Comprehensive Pain Management Safety Guidelines where by joining this practice, they limit all of their nerve blocks and joint injections to those done by our practice, for as long as we are retained to manage their care. Historic Controlled Substance Pharmacotherapy Review  PMP and historical list of controlled substances:  Highest opioid analgesic regimen found:  Most recent opioid analgesic:  Current opioid analgesics:  Highest recorded MME/day:  mg/day MME/day:  mg/day Medications: The patient did not bring the medication(s)  to the appointment, as requested in our "New Patient Package" Pharmacodynamics: Desired effects: Analgesia: The patient reports >50% benefit. Reported improvement in function: The patient reports medication allows her to accomplish basic ADLs. Clinically meaningful improvement in function (CMIF): Sustained CMIF goals met Perceived effectiveness: Described as relatively effective, allowing for increase in activities of daily living (ADL) Undesirable effects: Side-effects or Adverse reactions: None reported Historical Monitoring: The patient  reports that she does not use drugs. List of all UDS Test(s): Lab Results  Component Value Date   COCAINSCRNUR Negative 03/26/2015   PCPQUANT Negative 03/26/2015   CANNABQUANT Negative 03/26/2015   List of all Serum Drug Screening Test(s):  Lab Results  Component Value Date   PCPQUANT Negative 03/26/2015   CANNABQUANT Negative 03/26/2015   Historical Background Evaluation: [place] PDMP: Six (6) year initial data search conducted.             [place] Department of public safety, offender search: Editor, commissioning Information) Non-contributory Risk Assessment Profile: Aberrant behavior: None observed or detected today Risk factors for fatal opioid overdose: None identified today Fatal overdose hazard ratio (HR): Calculation deferred Non-fatal overdose hazard ratio (HR): Calculation deferred Risk of opioid abuse or dependence: 0.7-3.0% with doses ? 36 MME/day and 6.1-26% with doses ? 120 MME/day. Substance use disorder (SUD) risk level: Pending results of Medical Psychology Evaluation for SUD Opioid risk tool (ORT) (Total Score): 3  ORT Scoring interpretation table:  Score <3 = Low Risk for SUD  Score between 4-7 = Moderate Risk for SUD  Score >8 = High Risk for Opioid Abuse   PHQ-2 Depression Scale:  Total score:    PHQ-2 Scoring interpretation table: (Score and probability  of major depressive disorder)  Score 0 = No depression  Score 1 = 15.4% Probability   Score 2 = 21.1% Probability  Score 3 = 38.4% Probability  Score 4 = 45.5% Probability  Score 5 = 56.4% Probability  Score 6 = 78.6% Probability   PHQ-9 Depression Scale:  Total score:    PHQ-9 Scoring interpretation table:  Score 0-4 = No depression  Score 5-9 = Mild depression  Score 10-14 = Moderate depression  Score 15-19 = Moderately severe depression  Score 20-27 = Severe depression (2.4 times higher risk of SUD and 2.89 times higher risk of overuse)   Pharmacologic Plan: Pending ordered tests and/or consults  Meds  The patient has a current medication list which includes the following prescription(s): alprazolam, aspirin ec, atorvastatin, hydrocodone-acetaminophen, losartan-hydrochlorothiazide, and furosemide.  Current Outpatient Medications on File Prior to Visit  Medication Sig  . ALPRAZolam (XANAX) 0.25 MG tablet Take 1 tablet (0.25 mg total) by mouth 2 (two) times daily as needed for anxiety or sleep.  Marland Kitchen aspirin EC 81 MG tablet Take 81 mg by mouth daily.  Marland Kitchen atorvastatin (LIPITOR) 10 MG tablet Take 1 tablet (10 mg total) by mouth at bedtime.  Marland Kitchen HYDROcodone-acetaminophen (NORCO/VICODIN) 5-325 MG tablet Take 1 tablet by mouth 2 (two) times daily as needed for moderate pain.  Marland Kitchen losartan-hydrochlorothiazide (HYZAAR) 100-12.5 MG tablet Take 1 tablet by mouth daily.  . furosemide (LASIX) 20 MG tablet Take 20 mg by mouth daily as needed for edema.    No current facility-administered medications on file prior to visit.    Imaging Review  Cervical Imaging: Cervical MR wo contrast: No results found for this or any previous visit. Cervical MR wo contrast: No procedure found. Cervical MR w/wo contrast: No results found for this or any previous visit. Cervical MR w contrast: No results found for this or any previous visit. Cervical CT wo contrast: No results found for this or any previous visit. Cervical CT w/wo contrast: No results found for this or any previous visit. Cervical  CT w/wo contrast: No results found for this or any previous visit. Cervical CT w contrast: No results found for this or any previous visit. Cervical CT outside: No results found for this or any previous visit. Cervical DG 1 view: No results found for this or any previous visit. Cervical DG 2-3 views: No results found for this or any previous visit. Cervical DG F/E views: No results found for this or any previous visit. Cervical DG 2-3 clearing views: No results found for this or any previous visit. Cervical DG Bending/F/E views: No results found for this or any previous visit. Cervical DG complete: No results found for this or any previous visit. Cervical DG Myelogram views: No results found for this or any previous visit. Cervical DG Myelogram views: No results found for this or any previous visit. Cervical Discogram views: No results found for this or any previous visit.  Shoulder Imaging: Shoulder-R MR w contrast: No results found for this or any previous visit. Shoulder-L MR w contrast: No results found for this or any previous visit. Shoulder-R MR w/wo contrast: No results found for this or any previous visit. Shoulder-L MR w/wo contrast: No results found for this or any previous visit. Shoulder-R MR wo contrast: No results found for this or any previous visit. Shoulder-L MR wo contrast: No results found for this or any previous visit. Shoulder-R CT w contrast: No results found for this or any previous visit. Shoulder-L CT w  contrast: No results found for this or any previous visit. Shoulder-R CT w/wo contrast: No results found for this or any previous visit. Shoulder-L CT w/wo contrast: No results found for this or any previous visit. Shoulder-R CT wo contrast: No results found for this or any previous visit. Shoulder-L CT wo contrast: No results found for this or any previous visit. Shoulder-R DG Arthrogram: No results found for this or any previous visit. Shoulder-L DG Arthrogram:  No results found for this or any previous visit. Shoulder-R DG 1 view: No results found for this or any previous visit. Shoulder-L DG 1 view: No results found for this or any previous visit. Shoulder-R DG: No results found for this or any previous visit. Shoulder-L DG: No results found for this or any previous visit.  Thoracic Imaging: Thoracic MR wo contrast: No results found for this or any previous visit. Thoracic MR wo contrast: No procedure found. Thoracic MR w/wo contrast: No results found for this or any previous visit. Thoracic MR w contrast: No results found for this or any previous visit. Thoracic CT wo contrast: No results found for this or any previous visit. Thoracic CT w/wo contrast: No results found for this or any previous visit. Thoracic CT w/wo contrast: No results found for this or any previous visit. Thoracic CT w contrast: No results found for this or any previous visit. Thoracic DG 2-3 views: No results found for this or any previous visit. Thoracic DG 4 views: No results found for this or any previous visit. Thoracic DG: No results found for this or any previous visit. Thoracic DG w/swimmers view:  Results for orders placed during the hospital encounter of 06/02/17  [facility] Thoracic Spine W/Swimmers   Narrative CLINICAL DATA:  Acute midline back pain.  EXAM: THORACIC SPINE - 3 VIEWS  COMPARISON:  06/22/2014  FINDINGS: Mild degenerative spurring throughout the thoracic spine. Normal alignment. No fracture.  IMPRESSION: Early degenerative changes.  No acute bony abnormality.   Electronically Signed   By: Rolm Baptise M.D.   On: 06/02/2017 19:08    Thoracic DG Myelogram views: No results found for this or any previous visit. Thoracic DG Myelogram views: No results found for this or any previous visit.  Lumbosacral Imaging: Lumbar MR wo contrast:  Results for orders placed during the hospital encounter of 02/06/17  MR LUMBAR SPINE WO CONTRAST   Narrative  CLINICAL DATA:  72 year old female with acute lumbar back pain radiating to the right buttock, left hip and down the left leg. Hip injection 2 weeks ago with some relief.  EXAM: MRI LUMBAR SPINE WITHOUT CONTRAST  TECHNIQUE: Multiplanar, multisequence MR imaging of the lumbar spine was performed. No intravenous contrast was administered.  COMPARISON:  CT Abdomen and Pelvis 09/15/2016 lumbar MRI 11/09/2015  FINDINGS: Segmentation: Normal, which is the same numbering system used on the 2017 MRI.  Alignment: Stable lumbar lordosis with mild multilevel spondylolisthesis, most pronounced at L3-L4 where there is mild retrolisthesis.  Vertebrae: Decreased degenerative endplate marrow edema anteriorly at T11-T12. Stable subtle right L5 superior endplate marrow edema which also appears degenerative. Background bone marrow signal is within normal limits. No acute osseous abnormality identified. Intact visible sacrum and SI joints.  Conus medullaris: Extends to the L1-L2 level and appears normal.  Paraspinal and other soft tissues: Visualized abdominal viscera and paraspinal soft tissues are within normal limits.  Disc levels:  T11-12: Mild disc space loss, disc bulge and facet hypertrophy. No stenosis.  T12-L1:  Negative.  L1-L2: Mild  circumferential disc bulge with broad-based posterior component. Mild to moderate facet and ligament flavum hypertrophy. No stenosis.  L2-L3: Left eccentric circumferential disc bulge with broad-based posterior component. Mild facet hypertrophy. Borderline to Mild lateral recess stenosis primarily on the left. This level is stable.  L3-L4: Circumferential disc bulge with endplate spurring. Broad-based posterior component. Mild to moderate facet and ligament flavum hypertrophy. Trace chronic left facet joint fluid. Borderline to mild L3 foraminal stenosis and right lateral recess stenosis. This level is stable.  L4-L5: Circumferential disc bulge  and endplate spurring with broad-based posterior component. Moderate facet and ligament flavum hypertrophy. Increase trace facet joint fluid on the left. Mildly increased epidural lipomatosis but no significant spinal stenosis. Borderline to mild bilateral L4 foraminal stenosis and left lateral recess stenosis are stable.  L5-S1: Disc space loss. Slightly right eccentric circumferential disc bulge with endplate spurring. Broad-based posterior component. Mild left and moderate right facet hypertrophy. No spinal or lateral recess stenosis. Mild bilateral L5 foraminal stenosis. This level is stable.  IMPRESSION: 1. Stable MRI appearance of the lumbar spine since 2017. 2. Multilevel lumbar disc, endplate, and posterior element degeneration with no spinal stenosis, and only mild intermittent lateral recess and foraminal stenosis.   Electronically Signed   By: Genevie Ann M.D.   On: 02/06/2017 10:54    Lumbar MR wo contrast: No procedure found. Lumbar MR w/wo contrast: No results found for this or any previous visit. Lumbar MR w contrast: No results found for this or any previous visit. Lumbar CT wo contrast: No results found for this or any previous visit. Lumbar CT w/wo contrast: No results found for this or any previous visit. Lumbar CT w/wo contrast: No results found for this or any previous visit. Lumbar CT w contrast: No results found for this or any previous visit. Lumbar DG 1V: No results found for this or any previous visit. Lumbar DG 1V (Clearing): No results found for this or any previous visit. Lumbar DG 2-3V (Clearing): No results found for this or any previous visit. Lumbar DG 2-3 views: No results found for this or any previous visit. Lumbar DG (Complete) 4+V: No results found for this or any previous visit. Lumbar DG F/E views: No results found for this or any previous visit. Lumbar DG Bending views: No results found for this or any previous visit. Lumbar DG Myelogram  views: No results found for this or any previous visit. Lumbar DG Myelogram: No results found for this or any previous visit. Lumbar DG Myelogram: No results found for this or any previous visit. Lumbar DG Myelogram: No results found for this or any previous visit. Lumbar DG Myelogram Lumbosacral: No results found for this or any previous visit. Lumbar DG Diskogram views: No results found for this or any previous visit. Lumbar DG Diskogram views: No results found for this or any previous visit. Lumbar DG Epidurogram OP: No results found for this or any previous visit. Lumbar DG Epidurogram IP: No results found for this or any previous visit.  Sacroiliac Joint Imaging: Sacroiliac Joint DG: No results found for this or any previous visit. Sacroiliac Joint MR w/wo contrast: No results found for this or any previous visit. Sacroiliac Joint MR wo contrast: No results found for this or any previous visit.  Spine Imaging: Whole Spine DG Myelogram views: No results found for this or any previous visit. Whole Spine MR Mets screen: No results found for this or any previous visit. Whole Spine MR Mets screen: No results found  for this or any previous visit. Whole Spine MR w/wo: No results found for this or any previous visit. MRA Spinal Canal w/ cm: No results found for this or any previous visit. MRA Spinal Canal wo/ cm: No procedure found. MRA Spinal Canal w/wo cm: No results found for this or any previous visit. Spine Outside MR Films: No results found for this or any previous visit. Spine Outside CT Films: No results found for this or any previous visit. CT-Guided Biopsy: No results found for this or any previous visit. CT-Guided Needle Placement: No results found for this or any previous visit. DG Spine outside: No results found for this or any previous visit. IR Spine outside: No results found for this or any previous visit. NM Spine outside: No results found for this or any previous  visit.  Hip Imaging: Hip-R MR w contrast: No results found for this or any previous visit. Hip-L MR w contrast: No results found for this or any previous visit. Hip-R MR w/wo contrast: No results found for this or any previous visit. Hip-L MR w/wo contrast: No results found for this or any previous visit. Hip-R MR wo contrast: No results found for this or any previous visit. Hip-L MR wo contrast: No results found for this or any previous visit. Hip-R CT w contrast: No results found for this or any previous visit. Hip-L CT w contrast: No results found for this or any previous visit. Hip-R CT w/wo contrast: No results found for this or any previous visit. Hip-L CT w/wo contrast: No results found for this or any previous visit. Hip-R CT wo contrast: No results found for this or any previous visit. Hip-L CT wo contrast: No results found for this or any previous visit. Hip-R DG 2-3 views:  Results for orders placed during the hospital encounter of 09/30/16  DG HIP UNILAT W OR W/O PELVIS 2-3 VIEWS RIGHT   Narrative CLINICAL DATA:  Bilateral hip pain  EXAM: DG HIP (WITH OR WITHOUT PELVIS) 2-3V RIGHT  COMPARISON:  AP pelvis of today's date and coronal and sagittal CT images through the pelvis and hips of September 15, 2016.  FINDINGS: The bones are subjectively adequately mineralized. The right hip joint space is well maintained. The articular surfaces of the femoral head and acetabulum remains smoothly rounded. The femoral neck and intertrochanteric and subtrochanteric regions are normal. The observed portions of the left hemipelvis are normal.  IMPRESSION: There is no acute or significant chronic bony abnormality of the right hip.   Electronically Signed   By: David  Martinique M.D.   On: 09/30/2016 13:53    Hip-L DG 2-3 views:  Results for orders placed during the hospital encounter of 09/30/16  DG HIP UNILAT W OR W/O PELVIS 2-3 VIEWS LEFT   Narrative CLINICAL DATA:  Increasing pain  in both hips, LEFT hip pain/arthralgia  EXAM: DG HIP (WITH OR WITHOUT PELVIS) 2-3V LEFT  COMPARISON:  None  FINDINGS: Osseous demineralization.  Hip and SI joint spaces preserved.  No acute fracture, dislocation, or bone destruction.  Minimal spurring at the LEFT femoral head.  IMPRESSION: Minimal degenerative changes at LEFT hip joint.  No acute abnormalities.   Electronically Signed   By: Lavonia Dana M.D.   On: 09/30/2016 13:51    Hip-R DG Arthrogram: No results found for this or any previous visit. Hip-L DG Arthrogram: No results found for this or any previous visit. Hip-B DG Bilateral: No results found for this or any previous visit.  Knee Imaging: Knee-R MR w contrast: No results found for this or any previous visit. Knee-L MR w contrast: No results found for this or any previous visit. Knee-R MR w/wo contrast: No results found for this or any previous visit. Knee-L MR w/wo contrast: No results found for this or any previous visit. Knee-R MR wo contrast: No results found for this or any previous visit. Knee-L MR wo contrast: No results found for this or any previous visit. Knee-R CT w contrast: No results found for this or any previous visit. Knee-L CT w contrast: No results found for this or any previous visit. Knee-R CT w/wo contrast: No results found for this or any previous visit. Knee-L CT w/wo contrast: No results found for this or any previous visit. Knee-R CT wo contrast: No results found for this or any previous visit. Knee-L CT wo contrast: No results found for this or any previous visit. Knee-R DG 1-2 views: No results found for this or any previous visit. Knee-L DG 1-2 views: No results found for this or any previous visit. Knee-R DG 3 views: No results found for this or any previous visit. Knee-L DG 3 views: No results found for this or any previous visit. Knee-R DG 4 views: No results found for this or any previous visit. Knee-L DG 4 views: No  results found for this or any previous visit. Knee-R DG Arthrogram: No results found for this or any previous visit. Knee-L DG Arthrogram: No results found for this or any previous visit.  Note: Available results from prior imaging studies were reviewed.        ROS  Cardiovascular History: High blood pressure Pulmonary or Respiratory History: No reported pulmonary signs or symptoms such as wheezing and difficulty taking a deep full breath (Asthma), difficulty blowing air out (Emphysema), coughing up mucus (Bronchitis), persistent dry cough, or temporary stoppage of breathing during sleep Neurological History: No reported neurological signs or symptoms such as seizures, abnormal skin sensations, urinary and/or fecal incontinence, being born with an abnormal open spine and/or a tethered spinal cord Review of Past Neurological Studies: No results found for this or any previous visit. Psychological-Psychiatric History: No reported psychological or psychiatric signs or symptoms such as difficulty sleeping, anxiety, depression, delusions or hallucinations (schizophrenial), mood swings (bipolar disorders) or suicidal ideations or attempts Gastrointestinal History: Heartburn due to stomach pushing into lungs (Hiatal hernia) and Reflux or heatburn Genitourinary History: No reported renal or genitourinary signs or symptoms such as difficulty voiding or producing urine, peeing blood, non-functioning kidney, kidney stones, difficulty emptying the bladder, difficulty controlling the flow of urine, or chronic kidney disease Hematological History: No reported hematological signs or symptoms such as prolonged bleeding, low or poor functioning platelets, bruising or bleeding easily, hereditary bleeding problems, low energy levels due to low hemoglobin or being anemic Endocrine History: No reported endocrine signs or symptoms such as high or low blood sugar, rapid heart rate due to high thyroid levels, obesity or  weight gain due to slow thyroid or thyroid disease Rheumatologic History: No reported rheumatological signs and symptoms such as fatigue, joint pain, tenderness, swelling, redness, heat, stiffness, decreased range of motion, with or without associated rash Musculoskeletal History: Negative for myasthenia gravis, muscular dystrophy, multiple sclerosis or malignant hyperthermia Work History: Working full time  Allergies  Ms. Mastandrea is allergic to ciprofloxacin; floxin [ofloxacin]; and sulfa antibiotics.  Laboratory Chemistry  Inflammation Markers Lab Results  Component Value Date   CRP 4.1 09/30/2016   ESRSEDRATE 5 09/30/2016   (  CRP: Acute Phase) (ESR: Chronic Phase) Renal Function Markers Lab Results  Component Value Date   BUN 13 06/07/2017   CREATININE 0.83 06/07/2017   GFRAA >60 06/07/2017   GFRNONAA >60 06/07/2017   Hepatic Function Markers Lab Results  Component Value Date   AST 22 06/07/2017   ALT 23 06/07/2017   ALBUMIN 4.4 06/07/2017   ALKPHOS 99 06/07/2017   Electrolytes Lab Results  Component Value Date   NA 139 06/07/2017   K 3.3 (L) 06/07/2017   CL 102 06/07/2017   CALCIUM 9.5 06/07/2017   MG 1.6 09/30/2016   Neuropathy Markers Lab Results  Component Value Date   VITAMINB12 218 (L) 09/30/2016   Bone Pathology Markers Lab Results  Component Value Date   ALKPHOS 99 06/07/2017   25OHVITD1 31 09/30/2016   25OHVITD2 <1.0 09/30/2016   25OHVITD3 30 09/30/2016   CALCIUM 9.5 06/07/2017   Coagulation Parameters Lab Results  Component Value Date   PLT 293 06/07/2017   Cardiovascular Markers Lab Results  Component Value Date   BNP 30 07/14/2012   HGB 11.7 (L) 06/07/2017   HCT 34.5 (L) 06/07/2017   Note: Lab results reviewed.  [place]  Drug: Ms. Jurek  reports that she does not use drugs. Alcohol:  reports that she drinks about 8.4 oz of alcohol per week. Tobacco:  reports that she has never smoked. She has never used smokeless tobacco. Medical:   has a past medical history of Carpal tunnel syndrome (05/09/2014), Chest pain, Chronic low back pain, Complication of anesthesia, Fatigue, GERD (gastroesophageal reflux disease), Gout, Heart murmur, Hiatal hernia, Hip pain, Hyperlipidemia, Hypertension, OA (osteoarthritis), and Peripheral vascular disease ([place]). Family: family history includes Cancer in her father; Coronary artery disease in her mother.  Past Surgical History:  Procedure Laterality Date  . ABDOMINAL HYSTERECTOMY  1982   Partial  . APPENDECTOMY    . ARM SURGERY Left    OF THE ULNA NERVE  . BREAST LUMPECTOMY Right    BREAST TUMOR REMOVAL  . CARDIOVASCULAR STRESS TEST  08/11/2006   NORMAL. EF 55-60%  . CARDIOVASCULAR STRESS TEST  10/31/1998   EF 60% AT REST TO 75-80% POST EXERCISE  . CARPAL TUNNEL RELEASE Right 2015  . ESOPHAGOGASTRODUODENOSCOPY  05/16/1990  . ESOPHAGOGASTRODUODENOSCOPY (EGD) WITH PROPOFOL N/A 02/04/2015   Procedure: ESOPHAGOGASTRODUODENOSCOPY (EGD) WITH PROPOFOL;  Surgeon: Hulen Luster, MD;  Location[facility] ENDOSCOPY;  Service: Gastroenterology;  Laterality: N/A;  . JOINT REPLACEMENT    . REDUCTION MAMMAPLASTY  06/11/15  . TOTAL HIP ARTHROPLASTY Left 04/13/2017   Procedure: TOTAL LEFT HIP ARTHROPLASTY ANTERIOR APPROACH;  Surgeon: Renette Butters, MD;  Location: [facility];  Service: Orthopedics;  Laterality: Left;   Active Ambulatory Problems    Diagnosis Date Noted  . Chest pain with high risk for cardiac etiology 10/20/2010  . Dyslipidemia 10/20/2010  . Osteoarthrosis 10/20/2010  . Fatigue 10/20/2010  . Palpitations 09/10/2011  . Carpal tunnel syndrome 05/09/2014  . Essential hypertension 06/04/2015  . Familial multiple lipoprotein-type hyperlipidemia 06/04/2015  . Hip pain 06/04/2015  . IFG (impaired fasting glucose) 06/04/2015  . Medication monitoring encounter 08/04/2016  . Malaise 10/20/2010  . Pedal edema 09/29/2016  . Primary osteoarthritis of first carpometacarpal joint of left hand 09/10/2016  .  Psoriasis, unspecified 09/10/2016  . Pure hypercholesterolemia 05/08/2015  . Sacral pain 09/10/2016  . Chronic pain syndrome 09/30/2016  . Opiate use 09/30/2016  . Low back pain (secondary) bilateral (right greater than left) 09/30/2016  . Midline thoracic back pain (  tertiary) 09/30/2016  . B12 deficiency 12/08/2016  . Arthritis of left hip 02/17/2017  . Piriformis syndrome of right side 02/17/2017  . Diverticulitis 02/17/2017  . Primary osteoarthritis of left hip 03/23/2017  . Diverticulosis 07/13/2017  . Anxiety 08/25/2017  . Vitamin D deficiency 10/20/2017  . Elevated TSH 10/20/2017   Resolved Ambulatory Problems    Diagnosis Date Noted  . No Resolved Ambulatory Problems   Past Medical History:  Diagnosis Date  . Carpal tunnel syndrome 05/09/2014  . Chest pain   . Chronic low back pain   . Complication of anesthesia   . Fatigue   . GERD (gastroesophageal reflux disease)   . Gout   . Heart murmur   . Hiatal hernia   . Hip pain   . Hyperlipidemia   . Hypertension   . OA (osteoarthritis)   . Peripheral vascular disease ([place])    Constitutional Exam  General appearance: Well nourished, well developed, and well hydrated. In no apparent acute distress Vitals:   11/03/17 1418  BP: (!) 156/81  Pulse: 64  Resp: 16  Temp: 98.2 F (36.8 C)  TempSrc: Oral  SpO2: 100%  Weight: 170 lb (77.1 kg)  Height: _0  (1.651 m)   BMI Assessment: Estimated body mass index is 28.29 kg/m as calculated from the following:   Height as of this encounter: _1  (1.651 m).   Weight as of this encounter: 170 lb (77.1 kg).  BMI interpretation table: BMI level Category Range association with higher incidence of chronic pain  <18 kg/m2 Underweight   18.5-24.9 kg/m2 Ideal body weight   25-29.9 kg/m2 Overweight Increased incidence by 20%  30-34.9 kg/m2 Obese (Class I) Increased incidence by 68%  35-39.9 kg/m2 Severe obesity (Class II) Increased incidence by 136%  >40 kg/m2 Extreme obesity  (Class III) Increased incidence by 254%   BMI Readings from Last 4 Encounters:  11/03/17 28.29 kg/m  10/20/17 29.87 kg/m  08/25/17 29.35 kg/m  07/13/17 28.84 kg/m   Wt Readings from Last 4 Encounters:  11/03/17 170 lb (77.1 kg)  10/20/17 174 lb (78.9 kg)  08/25/17 171 lb (77.6 kg)  07/13/17 168 lb (76.2 kg)  Psych/Mental status: Alert, oriented x 3 (person, place, & time)       Eyes: PERLA Respiratory: No evidence of acute respiratory distress  Cervical Spine Exam  Inspection: No masses, redness, or swelling Alignment: Symmetrical Functional ROM: Unrestricted ROM      Stability: No instability detected Muscle strength & Tone: Functionally intact Sensory: Unimpaired Palpation: No palpable anomalies              Upper Extremity (UE) Exam    Side: Right upper extremity  Side: Left upper extremity  Inspection: No masses, redness, swelling, or asymmetry. No contractures  Inspection: No masses, redness, swelling, or asymmetry. No contractures  Functional ROM: Unrestricted ROM          Functional ROM: Unrestricted ROM          Muscle strength & Tone: Functionally intact  Muscle strength & Tone: Functionally intact  Sensory: Unimpaired  Sensory: Unimpaired  Palpation: No palpable anomalies              Palpation: No palpable anomalies              Specialized Test(s): Deferred         Specialized Test(s): Deferred          Thoracic Spine Exam  Inspection: No masses, redness, or swelling Alignment:  Symmetrical Functional ROM: Unrestricted ROM Stability: No instability detected Sensory: Unimpaired Muscle strength & Tone: No palpable anomalies  Lumbar Spine Exam  Inspection: No masses, redness, or swelling Alignment: Symmetrical Functional ROM: Unrestricted ROM      Stability: No instability detected Muscle strength & Tone: Functionally intact Sensory: Unimpaired Palpation: No palpable anomalies       Provocative Tests: Lumbar Hyperextension and rotation test: evaluation  deferred today       Patrick's Maneuver: evaluation deferred today                    Gait & Posture Assessment  Ambulation: Unassisted Gait: Relatively normal for age and body habitus Posture: WNL   Lower Extremity Exam    Side: Right lower extremity  Side: Left lower extremity  Inspection: No masses, redness, swelling, or asymmetry. No contractures  Inspection: No masses, redness, swelling, or asymmetry. No contractures  Functional ROM: Unrestricted ROM          Functional ROM: Unrestricted ROM          Muscle strength & Tone: Functionally intact  Muscle strength & Tone: Functionally intact  Sensory: Unimpaired  Sensory: Unimpaired  Palpation: No palpable anomalies  Palpation: No palpable anomalies   Assessment  Primary Diagnosis & Pertinent Problem List: The encounter diagnosis was Chronic pain syndrome.  Visit Diagnosis: 1. Chronic pain syndrome    Plan of Care  Initial treatment plan:  Please be advised that as per protocol, today's visit has been an evaluation only. We have not taken over the patient's controlled substance management.  Problem-specific plan: No problem-specific Assessment & Plan notes found for this encounter.  Ordered Lab-work, Procedure(s), Referral(s), & Consult(s): No orders of the defined types were placed in this encounter.  Pharmacotherapy: Medications ordered:  No orders of the defined types were placed in this encounter.  Medications administered during this visit: Jacqualynn B. Pelphrey had no medications administered during this visit.   Pharmacotherapy under consideration:  Opioid Analgesics: The patient was informed that there is no guarantee that she would be a candidate for opioid analgesics. The decision will be made following CDC guidelines. This decision will be based on the results of diagnostic studies, as well as Ms. Pagaduan's risk profile.  Membrane stabilizer: To be determined at a later time Muscle relaxant: To be determined at a  later time NSAID: To be determined at a later time Other analgesic(s): To be determined at a later time   Interventional therapies under consideration: Ms. Forgy was informed that there is no guarantee that she would be a candidate for interventional therapies. The decision will be based on the results of diagnostic studies, as well as Ms. Bamford's risk profile.  Possible procedure(s):    Provider-requested follow-up: No follow-ups on file.  No future appointments.  Primary Care Physician: Kathrine Haddock, NP Location: [facility] Note by:  Date: 11/03/2017; Time: 5:15 PM  Pain Score Disclaimer: We use the NRS-11 scale. This is a self-reported, subjective measurement of pain severity with only modest accuracy. It is used primarily to identify changes within a particular patient. It must be understood that outpatient pain scales are significantly less accurate that those used for research, where they can be applied under ideal controlled circumstances with minimal exposure to variables. In reality, the score is likely to be a combination of pain intensity and pain affect, where pain affect describes the degree of emotional arousal or changes in action readiness caused by the  sensory experience of pain. Factors such as social and work situation, setting, emotional state, anxiety levels, expectation, and prior pain experience may influence pain perception and show large inter-individual differences that may also be affected by time variables.  Patient instructions provided during this appointment: There are no Patient Instructions on file for this visit.

## 2017-11-17 ENCOUNTER — Ambulatory Visit (INDEPENDENT_AMBULATORY_CARE_PROVIDER_SITE_OTHER): Payer: Medicare Other

## 2017-11-17 ENCOUNTER — Encounter: Payer: Self-pay | Admitting: Physician Assistant

## 2017-11-17 ENCOUNTER — Ambulatory Visit (INDEPENDENT_AMBULATORY_CARE_PROVIDER_SITE_OTHER): Payer: Medicare Other | Admitting: Physician Assistant

## 2017-11-17 VITALS — BP 138/68 | HR 74 | Temp 98.0°F | Ht 63.0 in | Wt 179.3 lb

## 2017-11-17 VITALS — BP 138/68 | HR 74 | Temp 98.0°F | Resp 17 | Ht 63.0 in | Wt 179.3 lb

## 2017-11-17 DIAGNOSIS — Z1231 Encounter for screening mammogram for malignant neoplasm of breast: Secondary | ICD-10-CM

## 2017-11-17 DIAGNOSIS — Z0000 Encounter for general adult medical examination without abnormal findings: Secondary | ICD-10-CM

## 2017-11-17 DIAGNOSIS — G894 Chronic pain syndrome: Secondary | ICD-10-CM | POA: Diagnosis not present

## 2017-11-17 DIAGNOSIS — Z1239 Encounter for other screening for malignant neoplasm of breast: Secondary | ICD-10-CM

## 2017-11-17 NOTE — Progress Notes (Signed)
Subjective:    Patient ID: Tamara Fleming, female    DOB: July 03, 1945, 72 y.o.   MRN: [id number]  Tamara Fleming is a 72 y.o. female presenting on 11/17/2017 for Pain   HPI   Patient presenting today with chronic pain. Last visit in this clinic she was referred to pain management due to clinic policy of no longer prescribing opioid therapy. She was instructed she would not be prescribed opioids on first visit.   Referral placed 08/25/2017, she was unable to be contacted on 09/01/2017. She had appointment on 11/02/2017 where patient says provider was very rude to her and that they could do nothing for her, but she might be able to talk to the physician. She says the clinic was supposed to contact her but they haven't in two weeks. She reports she hasn't contacted the clinic herself because they were supposed to contact her.   She is worried because she feels like she has been left hanging and she is going on vacation and she won't be able to sit on the plane.   Social History   Tobacco Use  . Smoking status: Never Smoker  . Smokeless tobacco: Never Used  Substance Use Topics  . Alcohol use: Yes    Alcohol/week: 14.0 standard drinks    Types: 14 Glasses of wine per week    Comment: wine, 1-2 daily  . Drug use: No    Review of Systems Per HPI unless specifically indicated above     Objective:    BP 138/68   Pulse 74   Temp 98 F (36.7 C) (Temporal)   Ht 5\' 3"  (1.6 m)   Wt 179 lb 4.8 oz (81.3 kg)   LMP  (LMP Unknown)   SpO2 98%   BMI 31.76 kg/m   Wt Readings from Last 3 Encounters:  11/17/17 179 lb 4.8 oz (81.3 kg)  11/17/17 179 lb 4.8 oz (81.3 kg)  11/03/17 170 lb (77.1 kg)    Physical Exam  Constitutional: She is oriented to person, place, and time. She appears well-developed and well-nourished.  Cardiovascular: Normal rate.  Pulmonary/Chest: Effort normal.  Neurological: She is alert and oriented to person, place, and time.  Skin: Skin is warm and dry.    Psychiatric: She has a normal mood and affect. Her behavior is normal.   Results for orders placed or performed during the hospital encounter of 06/07/17  Urine culture  Result Value Ref Range   Specimen Description      URINE, RANDOM Performed at [facility], [address] [place], [place] [address]    Special Requests      NONE Performed at [facility], [address] [place], [place] [address]    Culture MULTIPLE SPECIES PRESENT, SUGGEST RECOLLECTION (A)    Report Status 06/09/2017 FINAL   Lipase, blood  Result Value Ref Range   Lipase 33 11 - 51 U/L  Comprehensive metabolic panel  Result Value Ref Range   Sodium 139 135 - 145 mmol/L   Potassium 3.3 (L) 3.5 - 5.1 mmol/L   Chloride 102 101 - 111 mmol/L   CO2 24 22 - 32 mmol/L   Glucose, Bld 124 (H) 65 - 99 mg/dL   BUN 13 6 - 20 mg/dL   Creatinine, Ser 0.83 0.44 - 1.00 mg/dL   Calcium 9.5 8.9 - 10.3 mg/dL   Total Protein 7.5 6.5 - 8.1 g/dL   Albumin 4.4 3.5 - 5.0 g/dL   AST 22  15 - 41 U/L   ALT 23 14 - 54 U/L   Alkaline Phosphatase 99 38 - 126 U/L   Total Bilirubin 1.0 0.3 - 1.2 mg/dL   GFR calc non Af Amer >60 >60 mL/min   GFR calc Af Amer >60 >60 mL/min   Anion gap 13 5 - 15  Urinalysis, Complete w Microscopic  Result Value Ref Range   Color, Urine YELLOW (A) YELLOW   APPearance CLEAR (A) CLEAR   Specific Gravity, Urine 1.008 1.005 - 1.030   pH 6.0 5.0 - 8.0   Glucose, UA NEGATIVE NEGATIVE mg/dL   Hgb urine dipstick NEGATIVE NEGATIVE   Bilirubin Urine NEGATIVE NEGATIVE   Ketones, ur NEGATIVE NEGATIVE mg/dL   Protein, ur NEGATIVE NEGATIVE mg/dL   Nitrite NEGATIVE NEGATIVE   Leukocytes, UA NEGATIVE NEGATIVE   RBC / HPF 0-5 0 - 5 RBC/hpf   WBC, UA 0-5 0 - 5 WBC/hpf   Bacteria, UA RARE (A) NONE SEEN   Squamous Epithelial / LPF 0-5 (A) NONE SEEN   Mucus PRESENT   CBC  Result Value Ref Range   WBC 10.7 3.6 - 11.0 K/uL   RBC 3.71 (L) 3.80 - 5.20 MIL/uL   Hemoglobin 11.7 (L) 12.0 - 16.0  g/dL   HCT 34.5 (L) 35.0 - 47.0 %   MCV 92.9 80.0 - 100.0 fL   MCH 31.4 26.0 - 34.0 pg   MCHC 33.8 32.0 - 36.0 g/dL   RDW 14.2 11.5 - 14.5 %   Platelets 293 150 - 440 K/uL      Assessment & Plan:  1. Chronic pain syndrome  Patient has been instructed that this clinic can no longer prescribe chronic opioid therapy for her. This has been well documented on her 08/25/2017 visit and also 10/20/2017 visit that she would no longer be receiving opioid therapy from this clinic and I have reiterated that today. I have suggested that she call the clinic back for a second appointment with a different provider if she chooses. If this does not work for her, we can place a new pain management referral to a different clinic.   I have spent 15 minutes with this patient, >50% of which was spent on counseling and coordination of care.  Follow up plan: Return if symptoms worsen or fail to improve.  Carles Collet, PA-C [facility] 11/17/2017, 4:26 PM

## 2017-11-17 NOTE — Progress Notes (Signed)
Subjective:   Tamara Fleming is a 72 y.o. female who presents for Medicare Annual (Subsequent) preventive examination.  Review of Systems:   Cardiac Risk Factors include: advanced age (>69men, >74 women);dyslipidemia;hypertension;obesity (BMI >30kg/m2)     Objective:     Vitals: BP 138/68 (BP Location: Left Arm, Patient Position: Sitting)   Pulse 74   Temp 98 F (36.7 C) (Temporal)   Resp 17   Ht 5\' 3"  (1.6 m)   Wt 179 lb 4.8 oz (81.3 kg)   LMP  (LMP Unknown)   SpO2 98%   BMI 31.76 kg/m   Body mass index is 31.76 kg/m.  Advanced Directives 11/17/2017 06/07/2017 04/15/2017 04/02/2017 09/30/2016 02/04/2015  Does Patient Have a Medical Advance Directive? No No No No No No  Would patient like information on creating a medical advance directive? Yes (MAU/Ambulatory/Procedural Areas - Information given) No - Patient declined No - Patient declined No - Patient declined No - Patient declined -    Tobacco Social History   Tobacco Use  Smoking Status Never Smoker  Smokeless Tobacco Never Used     Counseling given: Not Answered   Clinical Intake:  Pre-visit preparation completed: Yes  Pain : 0-10 Pain Score: 6  Pain Type: Acute pain Pain Location: Hip Pain Orientation: Right, Left Pain Descriptors / Indicators: Aching Pain Onset: More than a month ago Pain Frequency: Constant     Nutritional Status: BMI > 30  Obese Nutritional Risks: None Diabetes: No  How often do you need to have someone help you when you read instructions, pamphlets, or other written materials from your doctor or pharmacy?: 1 - Never What is the last grade level you completed in school?: 1 year college   Interpreter Needed?: No  Information entered by :: Aydenn Gervin,LPN   Past Medical History:  Diagnosis Date  . Carpal tunnel syndrome 05/09/2014   Right  . Chest pain   . Chronic low back pain   . Complication of anesthesia    small mouth  . Fatigue   . GERD (gastroesophageal reflux  disease)    occ  . Gout   . Heart murmur   . Hiatal hernia   . Hip pain    Left  . Hyperlipidemia   . Hypertension   . OA (osteoarthritis)   . Peripheral vascular disease ([place])    plhebitis 20 yrs ago   Past Surgical History:  Procedure Laterality Date  . ABDOMINAL HYSTERECTOMY  1982   Partial  . APPENDECTOMY    . ARM SURGERY Left    OF THE ULNA NERVE  . BREAST LUMPECTOMY Right    BREAST TUMOR REMOVAL  . CARDIOVASCULAR STRESS TEST  08/11/2006   NORMAL. EF 55-60%  . CARDIOVASCULAR STRESS TEST  10/31/1998   EF 60% AT REST TO 75-80% POST EXERCISE  . CARPAL TUNNEL RELEASE Right 2015  . ESOPHAGOGASTRODUODENOSCOPY  05/16/1990  . ESOPHAGOGASTRODUODENOSCOPY (EGD) WITH PROPOFOL N/A 02/04/2015   Procedure: ESOPHAGOGASTRODUODENOSCOPY (EGD) WITH PROPOFOL;  Surgeon: Hulen Luster, MD;  Location[facility] ENDOSCOPY;  Service: Gastroenterology;  Laterality: N/A;  . JOINT REPLACEMENT    . REDUCTION MAMMAPLASTY  06/11/15  . TOTAL HIP ARTHROPLASTY Left 04/13/2017   Procedure: TOTAL LEFT HIP ARTHROPLASTY ANTERIOR APPROACH;  Surgeon: Renette Butters, MD;  Location: [facility];  Service: Orthopedics;  Laterality: Left;   Family History  Problem Relation Age of Onset  . Coronary artery disease Mother   . Cancer Father  Lung   Social History   Socioeconomic History  . Marital status: Married    Spouse name: Not on file  . Number of children: Not on file  . Years of education: 82, 1 year college   . Highest education level: Some college, no degree  Occupational History  . Not on file  Social Needs  . Financial resource strain: Not hard at all  . Food insecurity:    Worry: Never true    Inability: Never true  . Transportation needs:    Medical: No    Non-medical: No  Tobacco Use  . Smoking status: Never Smoker  . Smokeless tobacco: Never Used  Substance and Sexual Activity  . Alcohol use: Yes    Alcohol/week: 14.0 standard drinks    Types: 14 Glasses of wine per week    Comment: wine, 1-2  daily  . Drug use: No  . Sexual activity: Not Currently    Birth control/protection: None  Lifestyle  . Physical activity:    Days per week: 0 days    Minutes per session: 0 min  . Stress: Not at all  Relationships  . Social connections:    Talks on phone: More than three times a week    Gets together: More than three times a week    Attends religious service: More than 4 times per year    Active member of club or organization: Yes    Attends meetings of clubs or organizations: More than 4 times per year    Relationship status: Married  Other Topics Concern  . Not on file  Social History Narrative   Self employed    Outpatient Encounter Medications as of 11/17/2017  Medication Sig  . aspirin EC 81 MG tablet Take 81 mg by mouth daily.  Marland Kitchen atorvastatin (LIPITOR) 10 MG tablet Take 1 tablet (10 mg total) by mouth at bedtime.  Marland Kitchen HYDROcodone-acetaminophen (NORCO/VICODIN) 5-325 MG tablet Take 1 tablet by mouth 2 (two) times daily as needed for moderate pain.  Marland Kitchen ALPRAZolam (XANAX) 0.25 MG tablet Take 1 tablet (0.25 mg total) by mouth 2 (two) times daily as needed for anxiety or sleep. (Patient not taking: Reported on 11/17/2017)  . furosemide (LASIX) 20 MG tablet Take 20 mg by mouth daily as needed for edema.   Marland Kitchen losartan-hydrochlorothiazide (HYZAAR) 100-12.5 MG tablet Take 1 tablet by mouth daily. (Patient not taking: Reported on 11/17/2017)   No facility-administered encounter medications on file as of 11/17/2017.     Activities of Daily Living In your present state of health, do you have any difficulty performing the following activities: 11/17/2017 04/15/2017  Hearing? Y -  Comment ringing in ears for 20 years  -  Vision? N -  Difficulty concentrating or making decisions? N -  Comment - -  Walking or climbing stairs? Y -  Comment - -  Dressing or bathing? N -  Doing errands, shopping? N N  Preparing Food and eating ? N -  Using the Toilet? N -  In the past six months, have you  accidently leaked urine? N -  Do you have problems with loss of bowel control? N -  Managing your Medications? N -  Managing your Finances? N -  Housekeeping or managing your Housekeeping? N -  Some recent data might be hidden    Patient Care Team: Kathrine Haddock, NP as PCP - General (Nurse Practitioner) Almedia Balls, MD as Consulting Physician (Orthopedic Surgery) Isaias Cowman, MD as Consulting Physician (Cardiology)  Assessment:   This is a routine wellness examination for Brendan.  Exercise Activities and Dietary recommendations Current Exercise Habits: The patient does not participate in regular exercise at present, Exercise limited by: None identified  Goals    . DIET - INCREASE WATER INTAKE     Recommend drinking at least 6-8 glasses of water a day        Fall Risk Fall Risk  11/17/2017 11/03/2017 09/30/2016 03/29/2015  Falls in the past year? Yes Yes Yes Yes  Number falls in past yr: 2 or more 2 or more 2 or more 1  Comment - this was prior to having hip surgery which is what prompted this.  knee gave out and fell coming up steps -  Injury with Fall? Yes No No No  Risk Factor Category  High Fall Risk - - -  Risk for fall due to : - - (No Data) -  Risk for fall due to: Comment - - knees gives out -  Follow up Falls prevention discussed;Falls evaluation completed - Education provided -   Is the patient's home free of loose throw rugs in walkways, pet beds, electrical cords, etc?   no      Grab bars in the bathroom? no      Handrails on the stairs?   yes      Adequate lighting?   yes  Timed Get Up and Go performed: Completed in 9 seconds with no use of assistive devices, steady gait. No intervention needed at this time.   Depression Screen PHQ 2/9 Scores 11/17/2017 07/13/2017 09/30/2016  PHQ - 2 Score 0 0 0  PHQ- 9 Score 2 1 -     Cognitive Function     6CIT Screen 11/17/2017  What Year? 0 points  What month? 0 points  What time? 0 points  Count back  from 20 0 points  Months in reverse 0 points  Repeat phrase 0 points  Total Score 0    Immunization History  Administered Date(s) Administered  . Influenza, High Dose Seasonal PF 01/21/2016, 01/20/2017  . Influenza-Unspecified 02/27/2015  . Pneumococcal Conjugate-13 11/17/2013  . Pneumococcal Polysaccharide-23 05/16/2014    Qualifies for Shingles Vaccine? Yes, discussed shingrix vaccine   Screening Tests Health Maintenance  Topic Date Due  . MAMMOGRAM  05/13/2017  . INFLUENZA VACCINE  11/04/2017  . TETANUS/TDAP  12/08/2017 (Originally 05/29/1964)  . COLONOSCOPY  04/15/2023  . PNA vac Low Risk Adult  Completed  . DEXA SCAN  Addressed  . Hepatitis C Screening  Addressed    Cancer Screenings: Lung: Low Dose CT Chest recommended if Age 34-80 years, 30 pack-year currently smoking OR have quit w/in 15years. Patient does not qualify. Breast:  Up to date on Mammogram? No   Up to date of Bone Density/Dexa? Yes 06/13/2009 Colorectal: completed 04/14/2013  Additional Screenings: : Hepatitis C Screening: not indicated      Plan:    I have personally reviewed and addressed the Medicare Annual Wellness questionnaire and have noted the following in the patient's chart:  A. Medical and social history B. Use of alcohol, tobacco or illicit drugs  C. Current medications and supplements D. Functional ability and status E.  Nutritional status F.  Physical activity G. Advance directives H. List of other physicians I.  Hospitalizations, surgeries, and ER visits in previous 12 months J.  [place] such as hearing and vision if needed, cognitive and depression L. Referrals and appointments   In addition, I have  reviewed and discussed with patient certain preventive protocols, quality metrics, and best practice recommendations. A written personalized care plan for preventive services as well as general preventive health recommendations were provided to patient.   Signed,    Tyler Aas, LPN Nurse Health Advisor   Nurse Notes:none

## 2017-11-17 NOTE — Patient Instructions (Signed)
Tamara Fleming , Thank you for taking time to come for your Medicare Wellness Visit. I appreciate your ongoing commitment to your health goals. Please review the following plan we discussed and let me know if I can assist you in the future.   Screening recommendations/referrals: Colonoscopy: completed 04/14/2013 Mammogram: call to schedule  Bone Density: completed 06/13/2009 Recommended yearly ophthalmology/optometry visit for glaucoma screening and checkup Recommended yearly dental visit for hygiene and checkup  Vaccinations: Influenza vaccine: due 12/2017 Pneumococcal vaccine: completed series  Tdap vaccine: up to date Shingles vaccine: shingrix eligible, check with your insurance company for coverage     Advanced directives: Advance directive discussed with you today. I have provided a copy for you to complete at home and have notarized. Once this is complete please bring a copy in to our office so we can scan it into your chart.  Conditions/risks identified: Recommend drinking at least 6-8 glasses of water a day   Next appointment: Follow up in one year for your annual wellness exam.    Preventive Care 65 Years and Older, Female Preventive care refers to lifestyle choices and visits with your health care provider that can promote health and wellness. What does preventive care include?  A yearly physical exam. This is also called an annual well check.  Dental exams once or twice a year.  Routine eye exams. Ask your health care provider how often you should have your eyes checked.  Personal lifestyle choices, including:  Daily care of your teeth and gums.  Regular physical activity.  Eating a healthy diet.  Avoiding tobacco and drug use.  Limiting alcohol use.  Practicing safe sex.  Taking low-dose aspirin every day.  Taking vitamin and mineral supplements as recommended by your health care provider. What happens during an annual well check? The services and screenings  done by your health care provider during your annual well check will depend on your age, overall health, lifestyle risk factors, and family history of disease. Counseling  Your health care provider may ask you questions about your:  Alcohol use.  Tobacco use.  Drug use.  Emotional well-being.  Home and relationship well-being.  Sexual activity.  Eating habits.  History of falls.  Memory and ability to understand (cognition).  Work and work Statistician.  Reproductive health. Screening  You may have the following tests or measurements:  Height, weight, and BMI.  Blood pressure.  Lipid and cholesterol levels. These may be checked every 5 years, or more frequently if you are over 23 years old.  Skin check.  Lung cancer screening. You may have this screening every year starting at age 35 if you have a 30-pack-year history of smoking and currently smoke or have quit within the past 15 years.  Fecal occult blood test (FOBT) of the stool. You may have this test every year starting at age 40.  Flexible sigmoidoscopy or colonoscopy. You may have a sigmoidoscopy every 5 years or a colonoscopy every 10 years starting at age 62.  Hepatitis C blood test.  Hepatitis B blood test.  Sexually transmitted disease (STD) testing.  Diabetes screening. This is done by checking your blood sugar (glucose) after you have not eaten for a while (fasting). You may have this done every 1-3 years.  Bone density scan. This is done to screen for osteoporosis. You may have this done starting at age 62.  Mammogram. This may be done every 1-2 years. Talk to your health care provider about how often you should  have regular mammograms. Talk with your health care provider about your test results, treatment options, and if necessary, the need for more tests. Vaccines  Your health care provider may recommend certain vaccines, such as:  Influenza vaccine. This is recommended every year.  Tetanus,  diphtheria, and acellular pertussis (Tdap, Td) vaccine. You may need a Td booster every 10 years.  Zoster vaccine. You may need this after age 34.  Pneumococcal 13-valent conjugate (PCV13) vaccine. One dose is recommended after age 30.  Pneumococcal polysaccharide (PPSV23) vaccine. One dose is recommended after age 55. Talk to your health care provider about which screenings and vaccines you need and how often you need them. This information is not intended to replace advice given to you by your health care provider. Make sure you discuss any questions you have with your health care provider. Document Released: 04/19/2015 Document Revised: 12/11/2015 Document Reviewed: 01/22/2015 Elsevier Interactive Patient Education  2017 [place] Prevention in the Home Falls can cause injuries. They can happen to people of all ages. There are many things you can do to make your home safe and to help prevent falls. What can I do on the outside of my home?  Regularly fix the edges of walkways and driveways and fix any cracks.  Remove anything that might make you trip as you walk through a door, such as a raised step or threshold.  Trim any bushes or trees on the path to your home.  Use bright outdoor lighting.  Clear any walking paths of anything that might make someone trip, such as rocks or tools.  Regularly check to see if handrails are loose or broken. Make sure that both sides of any steps have handrails.  Any raised decks and porches should have guardrails on the edges.  Have any leaves, snow, or ice cleared regularly.  Use sand or salt on walking paths during winter.  Clean up any spills in your garage right away. This includes oil or grease spills. What can I do in the bathroom?  Use night lights.  Install grab bars by the toilet and in the tub and shower. Do not use towel bars as grab bars.  Use non-skid mats or decals in the tub or shower.  If you need to sit down in  the shower, use a plastic, non-slip stool.  Keep the floor dry. Clean up any water that spills on the floor as soon as it happens.  Remove soap buildup in the tub or shower regularly.  Attach bath mats securely with double-sided non-slip rug tape.  Do not have throw rugs and other things on the floor that can make you trip. What can I do in the bedroom?  Use night lights.  Make sure that you have a light by your bed that is easy to reach.  Do not use any sheets or blankets that are too big for your bed. They should not hang down onto the floor.  Have a firm chair that has side arms. You can use this for support while you get dressed.  Do not have throw rugs and other things on the floor that can make you trip. What can I do in the kitchen?  Clean up any spills right away.  Avoid walking on wet floors.  Keep items that you use a lot in easy-to-reach places.  If you need to reach something above you, use a strong step stool that has a grab bar.  Keep electrical cords out of  the way.  Do not use floor polish or wax that makes floors slippery. If you must use wax, use non-skid floor wax.  Do not have throw rugs and other things on the floor that can make you trip. What can I do with my stairs?  Do not leave any items on the stairs.  Make sure that there are handrails on both sides of the stairs and use them. Fix handrails that are broken or loose. Make sure that handrails are as long as the stairways.  Check any carpeting to make sure that it is firmly attached to the stairs. Fix any carpet that is loose or worn.  Avoid having throw rugs at the top or bottom of the stairs. If you do have throw rugs, attach them to the floor with carpet tape.  Make sure that you have a light switch at the top of the stairs and the bottom of the stairs. If you do not have them, ask someone to add them for you. What else can I do to help prevent falls?  Wear shoes that:  Do not have high  heels.  Have rubber bottoms.  Are comfortable and fit you well.  Are closed at the toe. Do not wear sandals.  If you use a stepladder:  Make sure that it is fully opened. Do not climb a closed stepladder.  Make sure that both sides of the stepladder are locked into place.  Ask someone to hold it for you, if possible.  Clearly mark and make sure that you can see:  Any grab bars or handrails.  First and last steps.  Where the edge of each step is.  Use tools that help you move around (mobility aids) if they are needed. These include:  Canes.  Walkers.  Scooters.  Crutches.  Turn on the lights when you go into a dark area. Replace any light bulbs as soon as they burn out.  Set up your furniture so you have a clear path. Avoid moving your furniture around.  If any of your floors are uneven, fix them.  If there are any pets around you, be aware of where they are.  Review your medicines with your doctor. Some medicines can make you feel dizzy. This can increase your chance of falling. Ask your doctor what other things that you can do to help prevent falls. This information is not intended to replace advice given to you by your health care provider. Make sure you discuss any questions you have with your health care provider. Document Released: 01/17/2009 Document Revised: 08/29/2015 Document Reviewed: 04/27/2014 Elsevier Interactive Patient Education  2017 [employer].

## 2017-12-09 ENCOUNTER — Telehealth: Payer: Self-pay

## 2017-12-09 NOTE — Telephone Encounter (Signed)
Mrs Tamara Fleming called and stated " When I seen Tamara Fleming she was going to talk to Dr Tamara Fleming to see if he would see me for my chronic pain but I haven't heard anything,can someone please let me know." Tamara Fleming was seen on 07/31 as a new pt.

## 2017-12-20 ENCOUNTER — Ambulatory Visit (INDEPENDENT_AMBULATORY_CARE_PROVIDER_SITE_OTHER): Payer: Medicare Other | Admitting: Family Medicine

## 2017-12-20 ENCOUNTER — Encounter: Payer: Self-pay | Admitting: Family Medicine

## 2017-12-20 VITALS — BP 150/83 | HR 75 | Temp 98.8°F | Ht 63.0 in | Wt 180.1 lb

## 2017-12-20 DIAGNOSIS — G894 Chronic pain syndrome: Secondary | ICD-10-CM

## 2017-12-20 DIAGNOSIS — F419 Anxiety disorder, unspecified: Secondary | ICD-10-CM

## 2017-12-20 DIAGNOSIS — E785 Hyperlipidemia, unspecified: Secondary | ICD-10-CM | POA: Diagnosis not present

## 2017-12-20 DIAGNOSIS — E7800 Pure hypercholesterolemia, unspecified: Secondary | ICD-10-CM

## 2017-12-20 DIAGNOSIS — I10 Essential (primary) hypertension: Secondary | ICD-10-CM

## 2017-12-20 MED ORDER — ATORVASTATIN CALCIUM 10 MG PO TABS: 10.0000 mg | ORAL_TABLET | Freq: Every day | ORAL | 1 refills | Status: DC

## 2017-12-20 MED ORDER — HYDROCODONE-ACETAMINOPHEN 5-325 MG PO TABS: 1.0000 | ORAL_TABLET | Freq: Two times a day (BID) | ORAL | 0 refills | Status: DC | PRN

## 2017-12-20 MED ORDER — ALPRAZOLAM 0.25 MG PO TABS: ORAL_TABLET | ORAL | 0 refills | Status: DC

## 2017-12-20 NOTE — Assessment & Plan Note (Signed)
Under good control on current regimen. Continue current regimen. Continue to monitor. Call with any concerns. Refills given.   

## 2017-12-20 NOTE — Assessment & Plan Note (Signed)
Slightly elevated today, likely due to pain. Will work on [employer] and recheck 1 month.

## 2017-12-20 NOTE — Progress Notes (Signed)
BP (!) 150/83 (BP Location: Left Arm, Patient Position: Sitting)   Pulse 75   Temp 98.8 F (37.1 C)   Ht 5\' 3"  (1.6 m)   Wt 180 lb 2 oz (81.7 kg)   LMP  (LMP Unknown)   SpO2 100%   BMI 31.91 kg/m    Subjective:    Patient ID: Tamara Fleming, female    DOB: 1946/03/07, 72 y.o.   MRN: [id number]  HPI: Tamara Fleming is a 72 y.o. female  Chief Complaint  Patient presents with  . Pain    rt hip pain, left IT band  . Hypertension   HYPERTENSION / Cache Satisfied with current treatment? yes Duration of hypertension: chronic BP monitoring frequency: not checking BP medication side effects: no Past BP meds: lostartan-hctz Duration of hyperlipidemia: chronic Cholesterol medication side effects: no Cholesterol supplements: none Past cholesterol medications: atorvastatin Medication compliance: excellent compliance Aspirin: yes Recent stressors: yes Recurrent headaches: no Visual changes: no Palpitations: no Dyspnea: no Chest pain: no Lower extremity edema: no Dizzy/lightheaded: no  CHRONIC PAIN- went to see the pain clinic and was not happy with them, states that they were very rude. Has not had a follow up with them. Has been on pain medicine for 15+ years. She has hip dysplasia for which she has had surgery. 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   Present dose: 10 Morphine equivalents Pain control status: exacerbated Duration: chronic Location: wide spread, hip, toes, low back Quality: aching and sore Current Pain Level: moderate Previous Pain Level: mild Breakthrough pain: yes Benefit from narcotic medications: yes What Activities task can be accomplished with current medication? Able to drive and sit and travel Interested in weaning off narcotics:no   Stool softners/OTC fiber: no  Previous pain specialty evaluation: yes Non-narcotic analgesic meds: no Narcotic contract: yes  Due to fly to [place] for a tour  in a little bit. Gets very anxious before flying. Was given xanax for flying at last appointment, but took it all already "to help me sleep because I didn't have pain medicine." Depression screen [facility] 2/9 12/20/2017 11/17/2017 07/13/2017 09/30/2016  Decreased Interest 0 0 0 0  Down, Depressed, Hopeless 0 0 0 0  PHQ - 2 Score 0 0 0 0  Altered sleeping 2 1 1  -  Tired, decreased energy 2 1 0 -  Change in appetite 0 0 0 -  Feeling bad or failure about yourself  0 0 0 -  Trouble concentrating 0 0 0 -  Moving slowly or fidgety/restless 0 0 0 -  Suicidal thoughts 0 0 0 -  PHQ-9 Score 4 2 1  -  Difficult doing work/chores Not difficult at all Not difficult at all - -   GAD 7 : Generalized Anxiety Score 12/20/2017  Nervous, Anxious, on Edge 1  Control/stop worrying 0  Worry too much - different things 0  Trouble relaxing 0  Restless 0  Easily annoyed or irritable 0  Afraid - awful might happen 0  Total GAD 7 Score 1  Anxiety Difficulty Somewhat difficult    Relevant past medical, surgical, family and social history reviewed and updated as indicated. Interim medical history since our last visit reviewed. Allergies and medications reviewed and updated.  Review of Systems  Constitutional: Negative.   Respiratory: Negative.   Cardiovascular: Negative.   Gastrointestinal: Negative.   Musculoskeletal: Positive for arthralgias, back pain and myalgias. Negative for gait problem, joint swelling, neck pain  and neck stiffness.  Skin: Negative.   Neurological: Negative.   Psychiatric/Behavioral: Positive for sleep disturbance. Negative for agitation, behavioral problems, confusion, decreased concentration, dysphoric mood, hallucinations, self-injury and suicidal ideas. The patient is nervous/anxious. The patient is not hyperactive.     Per HPI unless specifically indicated above     Objective:    BP (!) 150/83 (BP Location: Left Arm, Patient Position: Sitting)   Pulse 75   Temp 98.8 F (37.1 C)    Ht 5\' 3"  (1.6 m)   Wt 180 lb 2 oz (81.7 kg)   LMP  (LMP Unknown)   SpO2 100%   BMI 31.91 kg/m   Wt Readings from Last 3 Encounters:  12/20/17 180 lb 2 oz (81.7 kg)  11/17/17 179 lb 4.8 oz (81.3 kg)  11/17/17 179 lb 4.8 oz (81.3 kg)    Physical Exam  Constitutional: She is oriented to person, place, and time. She appears well-developed and well-nourished. No distress.  HENT:  Head: Normocephalic and atraumatic.  Right Ear: Hearing normal.  Left Ear: Hearing normal.  Nose: Nose normal.  Eyes: Conjunctivae and lids are normal. Right eye exhibits no discharge. Left eye exhibits no discharge. No scleral icterus.  Cardiovascular: Normal rate, regular rhythm, normal heart sounds and intact distal pulses. Exam reveals no gallop and no friction rub.  No murmur heard. Pulmonary/Chest: Effort normal and breath sounds normal. No stridor. No respiratory distress. She has no wheezes. She has no rales. She exhibits no tenderness.  Musculoskeletal: Normal range of motion.  Neurological: She is alert and oriented to person, place, and time.  Skin: Skin is warm, dry and intact. Capillary refill takes less than 2 seconds. No rash noted. She is not diaphoretic. No erythema. No pallor.  Psychiatric: She has a normal mood and affect. Her speech is normal and behavior is normal. Judgment and thought content normal. Cognition and memory are normal.  Nursing note and vitals reviewed.   Results for orders placed or performed during the hospital encounter of 06/07/17  Urine culture  Result Value Ref Range   Specimen Description      URINE, RANDOM Performed at [facility], [address] [place], [place] [address]    Special Requests      NONE Performed at [facility], [address] [place], [place] [address]    Culture MULTIPLE SPECIES PRESENT, SUGGEST RECOLLECTION (A)    Report Status 06/09/2017 FINAL   Lipase, blood  Result Value Ref Range   Lipase 33 11 - 51 U/L    Comprehensive metabolic panel  Result Value Ref Range   Sodium 139 135 - 145 mmol/L   Potassium 3.3 (L) 3.5 - 5.1 mmol/L   Chloride 102 101 - 111 mmol/L   CO2 24 22 - 32 mmol/L   Glucose, Bld 124 (H) 65 - 99 mg/dL   BUN 13 6 - 20 mg/dL   Creatinine, Ser 0.83 0.44 - 1.00 mg/dL   Calcium 9.5 8.9 - 10.3 mg/dL   Total Protein 7.5 6.5 - 8.1 g/dL   Albumin 4.4 3.5 - 5.0 g/dL   AST 22 15 - 41 U/L   ALT 23 14 - 54 U/L   Alkaline Phosphatase 99 38 - 126 U/L   Total Bilirubin 1.0 0.3 - 1.2 mg/dL   GFR calc non Af Amer >60 >60 mL/min   GFR calc Af Amer >60 >60 mL/min   Anion gap 13 5 - 15  Urinalysis, Complete w Microscopic  Result Value  Ref Range   Color, Urine YELLOW (A) YELLOW   APPearance CLEAR (A) CLEAR   Specific Gravity, Urine 1.008 1.005 - 1.030   pH 6.0 5.0 - 8.0   Glucose, UA NEGATIVE NEGATIVE mg/dL   Hgb urine dipstick NEGATIVE NEGATIVE   Bilirubin Urine NEGATIVE NEGATIVE   Ketones, ur NEGATIVE NEGATIVE mg/dL   Protein, ur NEGATIVE NEGATIVE mg/dL   Nitrite NEGATIVE NEGATIVE   Leukocytes, UA NEGATIVE NEGATIVE   RBC / HPF 0-5 0 - 5 RBC/hpf   WBC, UA 0-5 0 - 5 WBC/hpf   Bacteria, UA RARE (A) NONE SEEN   Squamous Epithelial / LPF 0-5 (A) NONE SEEN   Mucus PRESENT   CBC  Result Value Ref Range   WBC 10.7 3.6 - 11.0 K/uL   RBC 3.71 (L) 3.80 - 5.20 MIL/uL   Hemoglobin 11.7 (L) 12.0 - 16.0 g/dL   HCT 34.5 (L) 35.0 - 47.0 %   MCV 92.9 80.0 - 100.0 fL   MCH 31.4 26.0 - 34.0 pg   MCHC 33.8 32.0 - 36.0 g/dL   RDW 14.2 11.5 - 14.5 %   Platelets 293 150 - 440 K/uL      Assessment & Plan:   Problem List Items Addressed This Visit      Cardiovascular and Mediastinum   Essential hypertension    Slightly elevated today, likely due to pain. Will work on [employer] and recheck 1 month.       Relevant Medications   atorvastatin (LIPITOR) 10 MG tablet   Other Relevant Orders   Comprehensive metabolic panel     Other   Chronic pain syndrome - Primary (Chronic)    Patient  went to see pain management and was not happy with her treatment there. She refuses to go back to [facility] pain management. Discussed with patient again that this office does not do long term pain management. She will need to see pain management or wean off her medication. She is not happy about this. We will get her new referral to a new pain management doctor- records release and referral to preferred pain management in [place] signed and generated today. Given that she is traveling, we will get her a refill on her pain medicine today for 1 month. She will have to follow up monthly for medication and this is only a bridge to get her into pain management. We will not prescribe this for more than 6 months. We will not increase her dose. We will not give early Rxs. She will need to be seen to get an Rx. Call with any concerns. Rx sent to her pharmacy.      Relevant Orders   Ambulatory referral to Pain Clinic   Dyslipidemia    Under good control on current regimen. Continue current regimen. Continue to monitor. Call with any concerns. Refills given.        Relevant Medications   atorvastatin (LIPITOR) 10 MG tablet   Pure hypercholesterolemia   Relevant Medications   atorvastatin (LIPITOR) 10 MG tablet   Other Relevant Orders   Lipid Panel w/o Chol/HDL Ratio   Comprehensive metabolic panel   Anxiety    Discussed that I will give her xanax for flying- if she would like to take something for sleep or chronic anxiety, she will need to be seen to discuss further options. 8 pills for 4 legs of a flight sent to her pharmacy today. She is to have another flight in January- we will get her another  Rx for that prior to that flight.      Relevant Medications   ALPRAZolam (XANAX) 0.25 MG tablet       Follow up plan: Return in about 4 weeks (around 01/17/2018) for follow up pain.

## 2017-12-20 NOTE — Assessment & Plan Note (Signed)
Discussed that I will give her xanax for flying- if she would like to take something for sleep or chronic anxiety, she will need to be seen to discuss further options. 8 pills for 4 legs of a flight sent to her pharmacy today. She is to have another flight in January- we will get her another Rx for that prior to that flight.

## 2017-12-20 NOTE — Assessment & Plan Note (Addendum)
Patient went to see pain management and was not happy with her treatment there. She refuses to go back to [facility] pain management. Discussed with patient again that this office does not do long term pain management. She will need to see pain management or wean off her medication. She is not happy about this. We will get her new referral to a new pain management doctor- records release and referral to preferred pain management in [place] signed and generated today. Given that she is traveling, we will get her a refill on her pain medicine today for 1 month. She will have to follow up monthly for medication and this is only a bridge to get her into pain management. We will not prescribe this for more than 6 months. We will not increase her dose. We will not give early Rxs. She will need to be seen to get an Rx. Call with any concerns. Rx sent to her pharmacy.

## 2017-12-21 ENCOUNTER — Encounter: Payer: Self-pay | Admitting: Family Medicine

## 2017-12-21 ENCOUNTER — Other Ambulatory Visit: Payer: Self-pay | Admitting: Family Medicine

## 2017-12-21 DIAGNOSIS — R739 Hyperglycemia, unspecified: Secondary | ICD-10-CM

## 2017-12-21 LAB — LIPID PANEL W/O CHOL/HDL RATIO
Cholesterol, Total: 182 mg/dL (ref 100–199)
HDL: 60 mg/dL (ref >39)
LDL Calculated: 95 mg/dL (ref 0–99)
Triglycerides: 133 mg/dL (ref 0–149)
VLDL CHOLESTEROL CAL: 27 mg/dL (ref 5–40)

## 2017-12-21 LAB — COMPREHENSIVE METABOLIC PANEL
ALBUMIN: 4.5 g/dL (ref 3.5–4.8)
ALK PHOS: 81 IU/L (ref 39–117)
ALT: 29 IU/L (ref 0–32)
AST: 28 IU/L (ref 0–40)
Albumin/Globulin Ratio: 2 (ref 1.2–2.2)
BUN/Creatinine Ratio: 18 (ref 12–28)
BUN: 17 mg/dL (ref 8–27)
Bilirubin Total: 0.3 mg/dL (ref 0.0–1.2)
CALCIUM: 9.8 mg/dL (ref 8.7–10.3)
CO2: 24 mmol/L (ref 20–29)
CREATININE: 0.95 mg/dL (ref 0.57–1.00)
Chloride: 102 mmol/L (ref 96–106)
GFR calc Af Amer: 70 mL/min/{1.73_m2} (ref >59)
GFR, EST NON AFRICAN AMERICAN: 60 mL/min/{1.73_m2} (ref >59)
GLOBULIN, TOTAL: 2.2 g/dL (ref 1.5–4.5)
Glucose: 149 mg/dL — ABNORMAL HIGH (ref 65–99)
POTASSIUM: 3.9 mmol/L (ref 3.5–5.2)
SODIUM: 141 mmol/L (ref 134–144)
Total Protein: 6.7 g/dL (ref 6.0–8.5)

## 2017-12-31 ENCOUNTER — Encounter: Payer: Self-pay | Admitting: Family Medicine

## 2017-12-31 ENCOUNTER — Other Ambulatory Visit: Payer: Self-pay

## 2017-12-31 ENCOUNTER — Ambulatory Visit
Admission: RE | Admit: 2017-12-31 | Discharge: 2017-12-31 | Disposition: A | Discharge status: Home / Self Care | Payer: Medicare Other | Source: Ambulatory Visit | Attending: Family Medicine | Admitting: Family Medicine

## 2017-12-31 ENCOUNTER — Ambulatory Visit (INDEPENDENT_AMBULATORY_CARE_PROVIDER_SITE_OTHER): Payer: Medicare Other | Admitting: Family Medicine

## 2017-12-31 VITALS — BP 130/77 | HR 69 | Temp 98.2°F | Ht 63.0 in | Wt 181.0 lb

## 2017-12-31 DIAGNOSIS — M19041 Primary osteoarthritis, right hand: Secondary | ICD-10-CM | POA: Diagnosis not present

## 2017-12-31 DIAGNOSIS — M79641 Pain in right hand: Secondary | ICD-10-CM | POA: Insufficient documentation

## 2017-12-31 DIAGNOSIS — X58XXXA Exposure to other specified factors, initial encounter: Secondary | ICD-10-CM | POA: Insufficient documentation

## 2017-12-31 NOTE — Progress Notes (Signed)
BP 130/77   Pulse 69   Temp 98.2 F (36.8 C) (Oral)   Ht 5\' 3"  (1.6 m)   Wt 181 lb (82.1 kg)   LMP  (LMP Unknown)   SpO2 97%   BMI 32.06 kg/m    Subjective:    Patient ID: Tamara Fleming, female    DOB: May 30, 1945, 72 y.o.   MRN: [id number]  HPI: Tamara Fleming is a 72 y.o. female  Chief Complaint  Patient presents with  . Hand Pain    pt states hit her right hand, sweling and hurts to move fingers/ have taking advil, did not help the pain   4 days of bruising, swelling, pain of right hand since hand got caught between her husband who tripped and part of the airplane seat. Main areas of pain are in the 4th and 5th digit. Can move fingers and has no numbness or tingling. Taking norco for chronic pain and also taking NSAIDs with no relief.   Relevant past medical, surgical, family and social history reviewed and updated as indicated. Interim medical history since our last visit reviewed. Allergies and medications reviewed and updated.  Review of Systems  Per HPI unless specifically indicated above     Objective:    BP 130/77   Pulse 69   Temp 98.2 F (36.8 C) (Oral)   Ht 5\' 3"  (1.6 m)   Wt 181 lb (82.1 kg)   LMP  (LMP Unknown)   SpO2 97%   BMI 32.06 kg/m   Wt Readings from Last 3 Encounters:  12/31/17 181 lb (82.1 kg)  12/20/17 180 lb 2 oz (81.7 kg)  11/17/17 179 lb 4.8 oz (81.3 kg)    Physical Exam  Constitutional: She is oriented to person, place, and time. She appears well-developed and well-nourished.  HENT:  Head: Atraumatic.  Eyes: Conjunctivae and EOM are normal.  Neck: Normal range of motion. Neck supple.  Cardiovascular: Normal rate, regular rhythm and intact distal pulses.  Pulmonary/Chest: Effort normal and breath sounds normal.  Musculoskeletal: She exhibits edema and tenderness.  Has ROM in all 5 fingers right hand, but pain limiting strength for testing No obvious deformity palpable  Neurological: She is alert and oriented to person,  place, and time.  Skin: Skin is warm and dry.  Bruising, edema extending from site of impact right dorsal hand  Psychiatric: She has a normal mood and affect. Her behavior is normal.  Nursing note and vitals reviewed.   Results for orders placed or performed in visit on 12/20/17  Lipid Panel w/o Chol/HDL Ratio  Result Value Ref Range   Cholesterol, Total 182 100 - 199 mg/dL   Triglycerides 133 0 - 149 mg/dL   HDL 60 >39 mg/dL   VLDL Cholesterol Cal 27 5 - 40 mg/dL   LDL Calculated 95 0 - 99 mg/dL  Comprehensive metabolic panel  Result Value Ref Range   Glucose 149 (H) 65 - 99 mg/dL   BUN 17 8 - 27 mg/dL   Creatinine, Ser 0.95 0.57 - 1.00 mg/dL   GFR calc non Af Amer 60 >59 mL/min/1.73   GFR calc Af Amer 70 >59 mL/min/1.73   BUN/Creatinine Ratio 18 12 - 28   Sodium 141 134 - 144 mmol/L   Potassium 3.9 3.5 - 5.2 mmol/L   Chloride 102 96 - 106 mmol/L   CO2 24 20 - 29 mmol/L   Calcium 9.8 8.7 - 10.3 mg/dL   Total Protein 6.7 6.0 - 8.5  g/dL   Albumin 4.5 3.5 - 4.8 g/dL   Globulin, Total 2.2 1.5 - 4.5 g/dL   Albumin/Globulin Ratio 2.0 1.2 - 2.2   Bilirubin Total 0.3 0.0 - 1.2 mg/dL   Alkaline Phosphatase 81 39 - 117 IU/L   AST 28 0 - 40 IU/L   ALT 29 0 - 32 IU/L      Assessment & Plan:   Problem List Items Addressed This Visit    None    Visit Diagnoses    Right hand pain    -  Primary   Will obtain x-ray to r/o fx. Continue ice, elevation, rest, regular pain regimen.    Relevant Orders   DG Hand Complete Right       Follow up plan: Return if symptoms worsen or fail to improve.

## 2017-12-31 NOTE — Patient Instructions (Signed)
Follow up as needed

## 2018-01-03 ENCOUNTER — Telehealth: Payer: Self-pay | Admitting: Family Medicine

## 2018-01-03 NOTE — Telephone Encounter (Signed)
Looked in patient's chart, result note from [facility] under imaging. Called and read Rachel's message to the patient.

## 2018-01-03 NOTE — Telephone Encounter (Signed)
Copied from [place] [id number]. Topic: Quick Communication - Other Results >> Jan 03, 2018  3:52 PM Cecelia Byars, NT wrote: Patient called and is requesting a call back with results from her   xray  on 12/31/17 please call her at  [phone] or [phone]

## 2018-01-06 ENCOUNTER — Ambulatory Visit: Payer: Self-pay | Admitting: Unknown Physician Specialty

## 2018-01-06 DIAGNOSIS — T7840XA Allergy, unspecified, initial encounter: Secondary | ICD-10-CM | POA: Diagnosis not present

## 2018-01-06 NOTE — Telephone Encounter (Signed)
FYI only.

## 2018-01-06 NOTE — Telephone Encounter (Signed)
Pt called to report red, generalized rash that began 2 days ago. Pt stated that she has tried Calamine and po Benadryl without effect. Pt called to report her throat feels like it is"tightening up." Pt stated her throat is not sore. Pt voice sounds hoarse. Pt stated she cannot check to see if pus is present on her tonsils. Pt denies difficulty breathing.  Pt stated she just recently recovered from a sinus infection. Pt denies difficulty breathing, headache. Care advice given and pt is going to go to the nearest UC for evaluation ASAP.   Reason for Disposition . Face becomes swollen    Feels like throat is tightening  Additional Information . Negative: Sore throat    Denies sore throat . Negative: Mild widespread rash    Pt has red generalized rash.  Answer Assessment - Initial Assessment Questions 1. ONSET: "When did the throat start hurting?" (Hours or days ag Throat is not sore but feels like it is swelling  2. SEVERITY: "How bad is the sore throat?" (Scale 1-10; mild, moderate or severe)   - MILD (1-3):  doesn't interfere with eating or normal activities   - MODERATE (4-7): interferes with eating some solids and normal activities   - SEVERE (8-10):  excruciating pain, interferes with most normal activities   - SEVERE DYSPHAGIA: can't swallow liquids, drooling     hasnt re 3. STREP EXPOSURE: "Has there been any exposure to strep within the past week?" If so, ask: "What type of contact occurred?"      no 4.  VIRAL SYMPTOMS: "Are there any symptoms of a cold, such as a runny nose, cough, hoarse voice or red eyes?"      Hoarse voice had a sinus infection for 3 weeks that ended a week ago 5. FEVER: "Do you have a fever?" If so, ask: "What is your temperature, how was it measured, and when did it start?"     no 6. PUS ON THE TONSILS: "Is there pus on the tonsils in the back of your throat?"     Cannot perform 7. OTHER SYMPTOMS: "Do you have any other symptoms?" (e.g., difficulty breathing,  headache, rash)     Rash  8. PREGNANCY: "Is there any chance you are pregnant?" "When was your last menstrual period?"     n/a  Answer Assessment - Initial Assessment Questions 1. APPEARANCE of RASH: "Describe the rash." (e.g., spots, blisters, raised areas, skin peeling, scaly)     Red rash 2. SIZE: "How big are the spots?" (e.g., tip of pen, eraser, coin; inches, centimeters)      3. LOCATION: "Where is the rash located?"     "everywhere" 4. COLOR: "What color is the rash?" (Note: It is difficult to assess rash color in people with darker-colored skin. When this situation occurs, simply ask the caller to describe what they see.)     red 5. ONSET: "When did the rash begin?"     2 days ago 6. FEVER: "Do you have a fever?" If so, ask: "What is your temperature, how was it measured, and when did it start?"     no 7. ITCHING: "Does the rash itch?" If so, ask: "How bad is the itch?" (Scale 1-10; or mild, moderate, severe)     yes 8. CAUSE: "What do you think is causing the rash?"     Pt does not know 9. MEDICATION FACTORS: "Have you started any new medications within the last 2 weeks?" (e.g., antibiotics)  10. OTHER SYMPTOMS: "Do you have any other symptoms?" (e.g., dizziness, headache, sore throat, joint pain)       Feels like throat is swollen and feels like it is tight, denies sore throat 11. PREGNANCY: "Is there any chance you are pregnant?" "When was your last menstrual period?"       n/a  Protocols used: RASH OR REDNESS - WIDESPREAD-A-AH, SORE THROAT-A-AH

## 2018-01-07 ENCOUNTER — Telehealth: Payer: Self-pay | Admitting: Family Medicine

## 2018-01-07 NOTE — Telephone Encounter (Signed)
Copied from [place] [id number]. Topic: Quick Communication - See Telephone Encounter >> Jan 07, 2018 10:54 AM Ahmed Prima L wrote: CRM for notification. See Telephone encounter for: 01/07/18.  Patient was triaged yesterday by [facility] for a serious rash all over her body, see encounter. She went to Urgent Care and was given prednisone, she is worse today. She called back to urgent care and they advised her to call her pcp. There are no open appointments today. She said it is worse at night than it is during the day. Patient would like the nurse to call her back asap. [phone] cell phone.

## 2018-01-07 NOTE — Telephone Encounter (Signed)
She would like to know if there is something stronger that she can be given.

## 2018-01-07 NOTE — Telephone Encounter (Signed)
She needs to go to ER for IV steroids.

## 2018-01-07 NOTE — Telephone Encounter (Signed)
Patient notified

## 2018-01-07 NOTE — Telephone Encounter (Signed)
Patient would like to be seen but we do not have any availability, she has been using the Calamine lotion , but it is not helping any longer.   [place]

## 2018-01-07 NOTE — Telephone Encounter (Signed)
Patient states that the rash was 10x worse this morning than it was yesterday, she states that it comes and goes. She states that Urgent Care diagnosed her with Hives, gave her prednisone and to continue benadryl.

## 2018-01-08 ENCOUNTER — Encounter: Payer: Self-pay | Admitting: Emergency Medicine

## 2018-01-08 ENCOUNTER — Emergency Department
Admission: EM | Admit: 2018-01-08 | Discharge: 2018-01-08 | Disposition: A | Discharge status: Home / Self Care | Payer: Medicare Other | Attending: Emergency Medicine | Admitting: Emergency Medicine

## 2018-01-08 ENCOUNTER — Other Ambulatory Visit: Payer: Self-pay

## 2018-01-08 DIAGNOSIS — L509 Urticaria, unspecified: Secondary | ICD-10-CM | POA: Diagnosis not present

## 2018-01-08 DIAGNOSIS — Z96642 Presence of left artificial hip joint: Secondary | ICD-10-CM | POA: Diagnosis not present

## 2018-01-08 DIAGNOSIS — I10 Essential (primary) hypertension: Secondary | ICD-10-CM | POA: Insufficient documentation

## 2018-01-08 DIAGNOSIS — Z79899 Other long term (current) drug therapy: Secondary | ICD-10-CM | POA: Insufficient documentation

## 2018-01-08 DIAGNOSIS — M549 Dorsalgia, unspecified: Secondary | ICD-10-CM | POA: Insufficient documentation

## 2018-01-08 MED ORDER — METHYLPREDNISOLONE SODIUM SUCC 125 MG IJ SOLR
80.0000 mg | Freq: Once | INTRAMUSCULAR | Status: AC
  Administered 2018-01-08: 80 mg via INTRAVENOUS
  Filled 2018-01-08: qty 2

## 2018-01-08 MED ORDER — SODIUM CHLORIDE 0.9 % IV BOLUS
500.0000 mL | Freq: Once | INTRAVENOUS | Status: AC
  Administered 2018-01-08: 500 mL via INTRAVENOUS

## 2018-01-08 MED ORDER — FAMOTIDINE IN NACL 20-0.9 MG/50ML-% IV SOLN
20.0000 mg | Freq: Once | INTRAVENOUS | Status: AC
  Administered 2018-01-08: 20 mg via INTRAVENOUS
  Filled 2018-01-08: qty 50

## 2018-01-08 MED ORDER — DIPHENHYDRAMINE HCL 50 MG/ML IJ SOLN
25.0000 mg | Freq: Once | INTRAMUSCULAR | Status: AC
  Administered 2018-01-08: 25 mg via INTRAVENOUS
  Filled 2018-01-08: qty 1

## 2018-01-08 MED ORDER — HYDROXYZINE HCL 25 MG PO TABS: 25.0000 mg | ORAL_TABLET | Freq: Three times a day (TID) | ORAL | 0 refills | Status: DC | PRN

## 2018-01-08 NOTE — Discharge Instructions (Addendum)
Start first doses of Atarax 3 PM and then every 8 hours.  Restart prednisone tomorrow morning.  Advised to follow-up with an allergist for definitive evaluation

## 2018-01-08 NOTE — ED Notes (Signed)
Patient  States she has mid back pain for several days  Denies any injury

## 2018-01-08 NOTE — ED Notes (Signed)
See triage note  Presents with generalized rash  States started on Monday   Has taken benadryl and calamine lotion  Was seen at Urgent care on Thursday  Placed on prednisone tape  States she is worse

## 2018-01-08 NOTE — ED Provider Notes (Signed)
[facility] Emergency Department Provider Note   ____________________________________________   First MD Initiated Contact with Patient 01/08/18 1125     (approximate)  I have reviewed the triage vital signs and the nursing notes.   HISTORY  Chief Complaint Allergic Reaction    HPI Tamara Fleming is a 72 y.o. female patient complain of diffuse hives for 5 days.  Patient was seen in urgent care clinic and started on p.o. Benadryl and prednisone.  Patient state 2 days ago the rash worsens.  There is also increased itching with the rash.  Patient denies signs of angioedema or anaphylactic's.  Patient states the rash seems to increase in the evening and at night.  Unable to determine the source causing the rash.  Past Medical History:  Diagnosis Date  . Carpal tunnel syndrome 05/09/2014   Right  . Chest pain   . Chronic low back pain   . Complication of anesthesia    small mouth  . Fatigue   . GERD (gastroesophageal reflux disease)    occ  . Gout   . Heart murmur   . Hiatal hernia   . Hip pain    Left  . Hyperlipidemia   . Hypertension   . OA (osteoarthritis)   . Peripheral vascular disease ([place])    plhebitis 20 yrs ago    Patient Active Problem List   Diagnosis Date Noted  . Vitamin D deficiency 10/20/2017  . Elevated TSH 10/20/2017  . Anxiety 08/25/2017  . Diverticulosis 07/13/2017  . Primary osteoarthritis of left hip 03/23/2017  . Arthritis of left hip 02/17/2017  . Piriformis syndrome of right side 02/17/2017  . Diverticulitis 02/17/2017  . B12 deficiency 12/08/2016  . Chronic pain syndrome 09/30/2016  . Opiate use 09/30/2016  . Low back pain (secondary) bilateral (right greater than left) 09/30/2016  . Midline thoracic back pain (tertiary) 09/30/2016  . Pedal edema 09/29/2016  . Primary osteoarthritis of first carpometacarpal joint of left hand 09/10/2016  . Psoriasis, unspecified 09/10/2016  . Sacral pain 09/10/2016  .  Medication monitoring encounter 08/04/2016  . Essential hypertension 06/04/2015  . Familial multiple lipoprotein-type hyperlipidemia 06/04/2015  . Hip pain 06/04/2015  . IFG (impaired fasting glucose) 06/04/2015  . Pure hypercholesterolemia 05/08/2015  . Carpal tunnel syndrome 05/09/2014  . Palpitations 09/10/2011  . Chest pain with high risk for cardiac etiology 10/20/2010  . Dyslipidemia 10/20/2010  . Osteoarthrosis 10/20/2010  . Fatigue 10/20/2010  . Malaise 10/20/2010    Past Surgical History:  Procedure Laterality Date  . ABDOMINAL HYSTERECTOMY  1982   Partial  . APPENDECTOMY    . ARM SURGERY Left    OF THE ULNA NERVE  . BREAST LUMPECTOMY Right    BREAST TUMOR REMOVAL  . CARDIOVASCULAR STRESS TEST  08/11/2006   NORMAL. EF 55-60%  . CARDIOVASCULAR STRESS TEST  10/31/1998   EF 60% AT REST TO 75-80% POST EXERCISE  . CARPAL TUNNEL RELEASE Right 2015  . ESOPHAGOGASTRODUODENOSCOPY  05/16/1990  . ESOPHAGOGASTRODUODENOSCOPY (EGD) WITH PROPOFOL N/A 02/04/2015   Procedure: ESOPHAGOGASTRODUODENOSCOPY (EGD) WITH PROPOFOL;  Surgeon: Hulen Luster, MD;  Location: [facility] ENDOSCOPY;  Service: Gastroenterology;  Laterality: N/A;  . JOINT REPLACEMENT    . REDUCTION MAMMAPLASTY  06/11/15  . TOTAL HIP ARTHROPLASTY Left 04/13/2017   Procedure: TOTAL LEFT HIP ARTHROPLASTY ANTERIOR APPROACH;  Surgeon: Renette Butters, MD;  Location: [place];  Service: Orthopedics;  Laterality: Left;    Prior to Admission medications   Medication Sig Start  Date End Date Taking? Authorizing Provider  ALPRAZolam Duanne Moron) 0.25 MG tablet May take 1-2 pills 30 minutes before flight. 12/20/17   Johnson, Megan P, DO  atorvastatin (LIPITOR) 10 MG tablet Take 1 tablet (10 mg total) by mouth at bedtime. 12/20/17   Johnson, Megan P, DO  HYDROcodone-acetaminophen (NORCO/VICODIN) 5-325 MG tablet Take 1 tablet by mouth 2 (two) times daily as needed for moderate pain. 12/20/17   Johnson, Megan P, DO  hydrOXYzine (ATARAX/VISTARIL) 25 MG  tablet Take 1 tablet (25 mg total) by mouth 3 (three) times daily as needed for itching. 01/08/18   Sable Feil, PA-C  losartan-hydrochlorothiazide (HYZAAR) 100-12.5 MG tablet Take 1 tablet by mouth daily. 07/23/17   Kathrine Haddock, NP    Allergies Ciprofloxacin; Floxin [ofloxacin]; and Sulfa antibiotics  Family History  Problem Relation Age of Onset  . Coronary artery disease Mother   . Cancer Father        Lung    Social History Social History   Tobacco Use  . Smoking status: Never Smoker  . Smokeless tobacco: Never Used  Substance Use Topics  . Alcohol use: Yes    Alcohol/week: 14.0 standard drinks    Types: 14 Glasses of wine per week    Comment: wine, 1-2 daily  . Drug use: No    Review of Systems Constitutional: No fever/chills Eyes: No visual changes. ENT: No sore throat. Cardiovascular: Denies chest pain. Respiratory: Denies shortness of breath. Gastrointestinal: No abdominal pain.  No nausea, no vomiting.  No diarrhea.  No constipation. Genitourinary: Negative for dysuria. Musculoskeletal: Left hip replacement 10 months ago.. Skin: Positive for rash. Neurological: Negative for headaches, focal weakness or numbness. Endocrine:Hypertension hyperlipidemia. Allergic/Immunilogical: Sulfa and Floxin antibiotics. ____________________________________________   PHYSICAL EXAM:  VITAL SIGNS: ED Triage Vitals  Enc Vitals Group     BP 01/08/18 1100 (!) 151/74     Pulse Rate 01/08/18 1100 77     Resp 01/08/18 1100 18     Temp 01/08/18 1100 98.2 F (36.8 C)     Temp Source 01/08/18 1100 Oral     SpO2 01/08/18 1100 100 %     Weight 01/08/18 1102 175 lb (79.4 kg)     Height 01/08/18 1102 5\' 3"  (1.6 m)     Head Circumference --      Peak Flow --      Pain Score 01/08/18 1102 0     Pain Loc --      Pain Edu? --      Excl. in [place]? --    Constitutional: Alert and oriented. Well appearing and in no acute distress. Eyes: Conjunctivae are normal. PERRL.  EOMI. Head: Atraumatic. Nose: No congestion/rhinnorhea. Mouth/Throat: Mucous membranes are moist.  Oropharynx non-erythematous. Neck: No stridor.  Cardiovascular: Normal rate, regular rhythm. Grossly normal heart sounds.  Good peripheral circulation.  Elevated blood pressure Respiratory: Normal respiratory effort.  No retractions. Lungs CTAB. Neurologic:  Normal speech and language. No gross focal neurologic deficits are appreciated. No gait instability. Skin:  Skin is warm, dry and intact.  Diffuse wheals.   Psychiatric: Mood and affect are normal. Speech and behavior are normal.  ____________________________________________   LABS (all labs ordered are listed, but only abnormal results are displayed)  Labs Reviewed - No data to display ____________________________________________  EKG   ____________________________________________  RADIOLOGY  ED MD interpretation:    Official radiology report(s): No results found.  ____________________________________________   PROCEDURES  Procedure(s) performed: None  Procedures  Critical Care performed:  No  ____________________________________________   INITIAL IMPRESSION / ASSESSMENT AND PLAN / ED COURSE  As part of my medical decision making, I reviewed the following data within the electronic MEDICAL RECORD NUMBER    Pruritus secondary to urticaria.  Patient responded well to IV steroids, H1 and H2 antihistamines.  Patient advised to restart oral steroids tomorrow.  Patient given prescription for Atarax to 25 mg every 8 hours.  Advised patient to consider evaluation by an allergist for her complaint.   ____________________________________________   FINAL CLINICAL IMPRESSION(S) / ED DIAGNOSES  Final diagnoses:  Urticaria     ED Discharge Orders         Ordered    hydrOXYzine (ATARAX/VISTARIL) 25 MG tablet  3 times daily PRN     01/08/18 1301           Note:  This document was prepared using Dragon voice  recognition software and may include unintentional dictation errors.    Sable Feil, PA-C 01/08/18 1305    Lisa Roca, MD 01/08/18 1344

## 2018-01-08 NOTE — ED Triage Notes (Signed)
Here for allergic reaction to unknown substance.  Started Monday. Hives noted to body.  Has been using benadryl PO and started prednisone Thursday at lunch time but hives not getting better.  Still itching.  No IM steroids given.  Denies feeling like throat closing or swelling. Unlabored. Handling secretions.

## 2018-01-10 ENCOUNTER — Encounter: Payer: Self-pay | Admitting: Family Medicine

## 2018-01-10 ENCOUNTER — Ambulatory Visit (INDEPENDENT_AMBULATORY_CARE_PROVIDER_SITE_OTHER): Payer: Medicare Other | Admitting: Family Medicine

## 2018-01-10 ENCOUNTER — Encounter

## 2018-01-10 ENCOUNTER — Other Ambulatory Visit: Payer: Self-pay

## 2018-01-10 VITALS — BP 123/75 | HR 66 | Temp 98.2°F | Ht 63.0 in | Wt 176.5 lb

## 2018-01-10 DIAGNOSIS — L509 Urticaria, unspecified: Secondary | ICD-10-CM

## 2018-01-10 MED ORDER — FAMOTIDINE 20 MG PO TABS: 20.0000 mg | ORAL_TABLET | Freq: Two times a day (BID) | ORAL | Status: DC

## 2018-01-10 MED ORDER — PREDNISONE 10 MG PO TABS: 60.0000 mg | ORAL_TABLET | Freq: Every day | ORAL | 0 refills | Status: AC

## 2018-01-10 MED ORDER — FEXOFENADINE HCL 180 MG PO TABS: 180.0000 mg | ORAL_TABLET | Freq: Every day | ORAL | 6 refills | Status: DC

## 2018-01-10 NOTE — Patient Instructions (Signed)
Hives  Hives (urticaria) are itchy, red, swollen areas on your skin. Hives can appear on any part of your body and can vary in size. They can be as small as the tip of a pen or much larger. Hives often fade within 24 hours (acute hives). In other cases, new hives appear after old ones fade. This cycle can continue for several days or weeks (chronic hives).  Hives result from your body's reaction to an irritant or to something that you are allergic to (trigger). When you are exposed to a trigger, your body releases a chemical (histamine) that causes redness, itching, and swelling. You can get hives immediately after being exposed to a trigger or hours later.  Hives do not spread from person to person (are not contagious). Your hives may get worse with scratching, exercise, and emotional stress.  What are the causes?  Causes of this condition include:   Allergies to certain foods or ingredients.   Insect bites or stings.   Exposure to pollen or pet dander.   Contact with latex or chemicals.   Spending time in sunlight, heat, or cold (exposure).   Exercise.   Stress.    You can also get hives from some medical conditions and treatments. These include:   Viruses, including the common cold.   Bacterial infections, such as urinary tract infections and strep throat.   Disorders such as vasculitis, lupus, or thyroid disease.   Certain medications.   Allergy shots.   Blood transfusions.    Sometimes, the cause of hives is not known (idiopathic hives).  What increases the risk?  This condition is more likely to develop in:   Women.   People who have food allergies, especially to citrus fruits, milk, eggs, peanuts, tree nuts, or shellfish.   People who are allergic to:  ? Medicines.  ? Latex.  ? Insects.  ? Animals.  ? Pollen.   People who have certain medical conditions, includinglupus or thyroid disease.    What are the signs or symptoms?  The main symptom of this condition is raised, itchyred or white  bumps or patches on your skin. These areas may:   Become large and swollen (welts).   Change in shape and location, quickly and repeatedly.   Be separate hives or connect over a large area of skin.   Sting or become painful.   Turn white when pressed in the center (blanch).    In severe cases, yourhands, feet, and face may also become swollen. This may occur if hives develop deeper in your skin.  How is this diagnosed?  This condition is diagnosed based on your symptoms, medical history, and physical exam. Your skin, urine, or blood may be tested to find out what is causing your hives and to rule out other health issues. Your health care provider may also remove a small sample of skin from the affected area and examine it under a microscope (biopsy).  How is this treated?  Treatment depends on the severity of your condition. Your health care provider may recommend using cool, wet cloths (cool compresses) or taking cool showers to relieve itching. Hives are sometimes treated with medicines, including:   Antihistamines.   Corticosteroids.   Antibiotics.   An injectable medicine (omalizumab). Your health care provider may prescribe this if you have chronic idiopathic hives and you continue to have symptoms even after treatment with antihistamines.    Severe cases may require an emergency injection of adrenaline (epinephrine) to prevent a   life-threatening allergic reaction (anaphylaxis).  Follow these instructions at home:  Medicines   Take or apply over-the-counter and prescription medicines only as told by your health care provider.   If you were prescribed an antibiotic medicine, use it as told by your health care provider. Do not stop taking the antibiotic even if you start to feel better.  Skin Care   Apply cool compresses to the affected areas.   Do not scratch or rub your skin.  General instructions   Do not take hot showers or baths. This can make itching worse.   Do not wear tight-fitting  clothing.   Use sunscreen and wear protective clothing when you are outside.   Avoid any substances that cause your hives. Keep a journal to help you track what causes your hives. Write down:  ? What medicines you take.  ? What you eat and drink.  ? What products you use on your skin.   Keep all follow-up visits as told by your health care provider. This is important.  Contact a health care provider if:   Your symptoms are not controlled with medicine.   Your joints are painful or swollen.  Get help right away if:   You have a fever.   You have pain in your abdomen.   Your tongue or lips are swollen.   Your eyelids are swollen.   Your chest or throat feels tight.   You have trouble breathing or swallowing.  These symptoms may represent a serious problem that is an emergency. Do not wait to see if the symptoms will go away. Get medical help right away. Call your local emergency services (911 in the [place] Do not drive yourself to the hospital.  This information is not intended to replace advice given to you by your health care provider. Make sure you discuss any questions you have with your health care provider.  Document Released: 03/23/2005 Document Revised: 08/21/2015 Document Reviewed: 01/09/2015  Elsevier Interactive Patient Education  2018 [employer]vier Inc[employer]

## 2018-01-10 NOTE — Progress Notes (Signed)
BP 123/75   Pulse 66   Temp 98.2 F (36.8 C) (Oral)   Ht 5\' 3"  (1.6 m)   Wt 176 lb 8 oz (80.1 kg)   LMP  (LMP Unknown)   SpO2 98%   BMI 31.27 kg/m    Subjective:    Patient ID: Tamara Fleming, female    DOB: January 03, 1946, 72 y.o.   MRN: [id number]  HPI: Tamara Fleming is a 72 y.o. female  Chief Complaint  Patient presents with  . Rash    pt states has had rashes all over her body x 8 days/ States she went to Urgent care, they prescribed prednisone and went to ER they prescribed hydroxyzine. pt states nothing has helped   ER FOLLOW UP Time since discharge: 2 days Hospital/facility: [facility] Diagnosis: Puritis/Hives Procedures/tests: None Consultants: None New medications: IV steroids, H1 and H2 antihistamines. Atarax q8 hours Discharge instructions:  Follow up here/ with allergist Status: worse- tends to go away during the day, then comes back at night   60 prednisone this AM, hydroxyzine, benadryl at night  RASH- feels like she is getting worse. Nothing is helping. Welps keep coming up, decrease slightly during the day, then come back up. Feeling terrible.  Duration:  8 days  Location: generalized  Itching: yes Burning: yes Redness: yes Oozing: no Scaling: yes Blisters: no Painful: yes Fevers: no Change in detergents/soaps/personal care products: no Recent illness: no Recent travel:no History of same: no Context: worse Alleviating factors: nothing Treatments attempted: prednisone, pepcid, IV steroids, hydroxyzine, benadryl Shortness of breath: no  Throat/tongue swelling: no Myalgias/arthralgias: no  Relevant past medical, surgical, family and social history reviewed and updated as indicated. Interim medical history since our last visit reviewed. Allergies and medications reviewed and updated.  Review of Systems  Constitutional: Negative.   Respiratory: Negative.   Cardiovascular: Negative.   Musculoskeletal: Negative.   Skin: Positive for rash.  Negative for color change, pallor and wound.  Psychiatric/Behavioral: Negative.     Per HPI unless specifically indicated above     Objective:    BP 123/75   Pulse 66   Temp 98.2 F (36.8 C) (Oral)   Ht 5\' 3"  (1.6 m)   Wt 176 lb 8 oz (80.1 kg)   LMP  (LMP Unknown)   SpO2 98%   BMI 31.27 kg/m   Wt Readings from Last 3 Encounters:  01/10/18 176 lb 8 oz (80.1 kg)  01/08/18 175 lb (79.4 kg)  12/31/17 181 lb (82.1 kg)    Physical Exam  Constitutional: She is oriented to person, place, and time. She appears well-developed and well-nourished. No distress.  HENT:  Head: Normocephalic and atraumatic.  Right Ear: Hearing normal.  Left Ear: Hearing normal.  Nose: Nose normal.  Eyes: Conjunctivae and lids are normal. Right eye exhibits no discharge. Left eye exhibits no discharge. No scleral icterus.  Cardiovascular: Normal rate, regular rhythm, normal heart sounds and intact distal pulses. Exam reveals no gallop and no friction rub.  No murmur heard. Pulmonary/Chest: Effort normal and breath sounds normal. No stridor. No respiratory distress. She has no wheezes. She has no rales. She exhibits no tenderness.  Musculoskeletal: Normal range of motion.  Neurological: She is alert and oriented to person, place, and time.  Skin: Skin is warm, dry and intact. Capillary refill takes less than 2 seconds. Rash noted. She is not diaphoretic. No erythema. No pallor.  Hives on whole body, raised, erythematous whelps, excoriated  Psychiatric: She has a  normal mood and affect. Her speech is normal and behavior is normal. Judgment and thought content normal. Cognition and memory are normal.  Nursing note and vitals reviewed.   Results for orders placed or performed in visit on 12/20/17  Lipid Panel w/o Chol/HDL Ratio  Result Value Ref Range   Cholesterol, Total 182 100 - 199 mg/dL   Triglycerides 133 0 - 149 mg/dL   HDL 60 >39 mg/dL   VLDL Cholesterol Cal 27 5 - 40 mg/dL   LDL Calculated 95 0  - 99 mg/dL  Comprehensive metabolic panel  Result Value Ref Range   Glucose 149 (H) 65 - 99 mg/dL   BUN 17 8 - 27 mg/dL   Creatinine, Ser 0.95 0.57 - 1.00 mg/dL   GFR calc non Af Amer 60 >59 mL/min/1.73   GFR calc Af Amer 70 >59 mL/min/1.73   BUN/Creatinine Ratio 18 12 - 28   Sodium 141 134 - 144 mmol/L   Potassium 3.9 3.5 - 5.2 mmol/L   Chloride 102 96 - 106 mmol/L   CO2 24 20 - 29 mmol/L   Calcium 9.8 8.7 - 10.3 mg/dL   Total Protein 6.7 6.0 - 8.5 g/dL   Albumin 4.5 3.5 - 4.8 g/dL   Globulin, Total 2.2 1.5 - 4.5 g/dL   Albumin/Globulin Ratio 2.0 1.2 - 2.2   Bilirubin Total 0.3 0.0 - 1.2 mg/dL   Alkaline Phosphatase 81 39 - 117 IU/L   AST 28 0 - 40 IU/L   ALT 29 0 - 32 IU/L      Assessment & Plan:   Problem List Items Addressed This Visit    None    Visit Diagnoses    Urticaria    -  Primary   WIll extend prednisone 60mg  for the next 3 days, then decrease to 40mg , Continue pepcid, hydroxyzine and benadryl, start allegra. Referral to allergy made today   Relevant Orders   Ambulatory referral to Allergy       Follow up plan: Return As scheduled.

## 2018-01-12 ENCOUNTER — Telehealth: Payer: Self-pay | Admitting: Family Medicine

## 2018-01-12 NOTE — Telephone Encounter (Unsigned)
Copied from [place] [id number]. Topic: Quick Communication - See Telephone Encounter >> Jan 12, 2018  3:17 PM Percell Belt A wrote: CRM for notification. See Telephone encounter for: 01/12/18.  Pt Called in and stated that Dr Wynetta Emery told her to call the office if she has not heard from [facility].  She stated she has not heard anything from them.  Dr Wynetta Emery was going to try to get her in to see someone locally   Best number [phone]

## 2018-01-13 NOTE — Telephone Encounter (Signed)
Pt is calling and she is willing to see any allergist even in [place] just make sure md is in her network

## 2018-01-13 NOTE — Telephone Encounter (Signed)
Called and left vm for [facility] requesting update on referral. Will await response.   Contact info: [facility] [address] [place], [place], [place] [address] Phone: [phone]

## 2018-01-13 NOTE — Telephone Encounter (Addendum)
Called to check status of referral. [facility] never received referral. Patient is very anxious about referral. Referral marked urgent by provider.

## 2018-01-17 ENCOUNTER — Telehealth: Payer: Self-pay | Admitting: Family Medicine

## 2018-01-17 NOTE — Telephone Encounter (Signed)
-----   Message from Jerene Pitch, [place] sent at 01/10/2018  3:01 PM EDT ----- Spoke with referral coordinator, she states that she never received a referral on this.   ----- Message ----- From: Jerene Pitch, CMA Sent: 01/10/2018   2:09 PM EDT To: [facility]  Called and left a detailed message for the referral coordinator to return my call.  ----- Message ----- From: Valerie Roys, DO Sent: 01/10/2018   1:39 PM EDT To: [facility]  When we have a chance, can we check with [facility] about her referral

## 2018-01-17 NOTE — Telephone Encounter (Signed)
Can we please check on referral status to [facility]?

## 2018-01-18 DIAGNOSIS — E785 Hyperlipidemia, unspecified: Secondary | ICD-10-CM | POA: Diagnosis not present

## 2018-01-18 DIAGNOSIS — I10 Essential (primary) hypertension: Secondary | ICD-10-CM | POA: Diagnosis not present

## 2018-01-18 DIAGNOSIS — R079 Chest pain, unspecified: Secondary | ICD-10-CM | POA: Diagnosis not present

## 2018-01-18 DIAGNOSIS — R002 Palpitations: Secondary | ICD-10-CM | POA: Diagnosis not present

## 2018-01-18 DIAGNOSIS — E7800 Pure hypercholesterolemia, unspecified: Secondary | ICD-10-CM | POA: Diagnosis not present

## 2018-01-18 NOTE — Telephone Encounter (Signed)
Will get records release signed at her appointment on Friday.    Has not heard from allergist- can we check on this?

## 2018-01-19 NOTE — Telephone Encounter (Signed)
Tamara Fleming: can you check on the allergist appt please?   Christian: Can you get a record release when she comes in for her appt. On 10/18.

## 2018-01-19 NOTE — Telephone Encounter (Signed)
Pt notes are still under review, they will contact pt to schedule when a provider finish reviewing her notes.

## 2018-01-19 NOTE — Telephone Encounter (Signed)
Done

## 2018-01-20 NOTE — Telephone Encounter (Signed)
Called and left a message letting the patient know that the notes were still under review and that they will contact her when they are finished.

## 2018-01-21 ENCOUNTER — Encounter: Payer: Self-pay | Admitting: Family Medicine

## 2018-01-21 ENCOUNTER — Ambulatory Visit (INDEPENDENT_AMBULATORY_CARE_PROVIDER_SITE_OTHER): Payer: Medicare Other | Admitting: Family Medicine

## 2018-01-21 ENCOUNTER — Other Ambulatory Visit: Payer: Self-pay

## 2018-01-21 VITALS — BP 93/64 | HR 84 | Temp 98.1°F | Ht 63.0 in | Wt 173.0 lb

## 2018-01-21 DIAGNOSIS — I952 Hypotension due to drugs: Secondary | ICD-10-CM | POA: Diagnosis not present

## 2018-01-21 DIAGNOSIS — R5383 Other fatigue: Secondary | ICD-10-CM | POA: Diagnosis not present

## 2018-01-21 DIAGNOSIS — G894 Chronic pain syndrome: Secondary | ICD-10-CM | POA: Diagnosis not present

## 2018-01-21 DIAGNOSIS — L509 Urticaria, unspecified: Secondary | ICD-10-CM | POA: Diagnosis not present

## 2018-01-21 LAB — CBC WITH DIFFERENTIAL/PLATELET
HEMOGLOBIN: 14.8 g/dL (ref 11.1–15.9)
Hematocrit: 42.5 % (ref 34.0–46.6)
LYMPHS ABS: 2.9 10*3/uL (ref 0.7–3.1)
Lymphs: 24 %
MCH: 34.9 pg — ABNORMAL HIGH (ref 26.6–33.0)
MCHC: 34.8 g/dL (ref 31.5–35.7)
MCV: 100 fL — ABNORMAL HIGH (ref 79–97)
MID (ABSOLUTE): 1.1 10*3/uL (ref 0.1–1.6)
MID: 13 %
NEUTROS ABS: 4.5 10*3/uL (ref 1.4–7.0)
NEUTROS PCT: 53 %
Platelets: 213 10*3/uL (ref 150–450)
RBC: 4.24 x10E6/uL (ref 3.77–5.28)
RDW: 12.9 % (ref 12.3–15.4)
WBC: 8.5 10*3/uL (ref 3.4–10.8)

## 2018-01-21 MED ORDER — HYDROCHLOROTHIAZIDE 12.5 MG PO CAPS: 12.5000 mg | ORAL_CAPSULE | Freq: Every day | ORAL | Status: DC

## 2018-01-21 MED ORDER — LOSARTAN POTASSIUM 50 MG PO TABS: 50.0000 mg | ORAL_TABLET | Freq: Every day | ORAL | 3 refills | Status: DC

## 2018-01-21 MED ORDER — HYDROCODONE-ACETAMINOPHEN 5-325 MG PO TABS: 1.0000 | ORAL_TABLET | Freq: Two times a day (BID) | ORAL | 0 refills | Status: DC | PRN

## 2018-01-21 NOTE — Assessment & Plan Note (Signed)
Checking labs today. CBC normal today. Dropping mildly on orthostatics. Await results.

## 2018-01-21 NOTE — Progress Notes (Signed)
BP 93/64   Pulse 84   Temp 98.1 F (36.7 C) (Oral)   Ht 5\' 3"  (1.6 m)   Wt 173 lb (78.5 kg)   LMP  (LMP Unknown)   SpO2 98%   BMI 30.65 kg/m    Subjective:    Patient ID: Tamara Fleming, female    DOB: 03/07/1946, 72 y.o.   MRN: [id number]  HPI: Tamara Fleming is a 72 y.o. female  Chief Complaint  Patient presents with  . Rash  . Fatigue    pt states she passed out twice last Tuesday while she was at the cardiologist office   CHRONIC PAIN- states that she took her medicine to help her sleep when she was having issues with her hives.   Present dose: 10 Morphine equivalents Pain control status: uncontrolled Duration: chronic Location: wide spread, hip, toes, low back Quality: aching and sore Current Pain Level: severe Previous Pain Level: moderate Breakthrough pain: yes Benefit from narcotic medications: yes What Activities task can be accomplished with current medication?: able to sit and drive and travel Interested in weaning off narcotics:no   Stool softners/OTC fiber: no  Previous pain specialty evaluation: yes Non-narcotic analgesic meds: no Narcotic contract: yes  Hives appear to be gone. Itching is better. She is not feeling well. She is very upset. She states that she has been tired and weak and dizzy. Patient notes that she passed out 2x at the cardiologist and threw up 3x while at the cardiologist. No mention of this in the cardiologist's note. She states that she feels like her is dehydrated. Her back is hurting a lot. She feels very dizzy and not like herself. She has been on the couch all week since her cardiology appointment. She is upset and not feeling well.   Relevant past medical, surgical, family and social history reviewed and updated as indicated. Interim medical history since our last visit reviewed. Allergies and medications reviewed and updated.  Review of Systems  Constitutional: Positive for fatigue. Negative for activity change, appetite  change, chills, diaphoresis, fever and unexpected weight change.  Respiratory: Negative.   Cardiovascular: Negative.   Genitourinary: Negative.   Musculoskeletal: Positive for arthralgias, back pain and myalgias. Negative for gait problem, joint swelling, neck pain and neck stiffness.  Skin: Negative.   Neurological: Positive for dizziness, weakness, light-headedness and numbness. Negative for tremors, seizures, syncope, facial asymmetry, speech difficulty and headaches.  Hematological: Negative.   Psychiatric/Behavioral: Positive for dysphoric mood and sleep disturbance. Negative for agitation, behavioral problems, confusion, decreased concentration, hallucinations, self-injury and suicidal ideas. The patient is nervous/anxious. The patient is not hyperactive.     Per HPI unless specifically indicated above     Objective:    BP 93/64   Pulse 84   Temp 98.1 F (36.7 C) (Oral)   Ht 5\' 3"  (1.6 m)   Wt 173 lb (78.5 kg)   LMP  (LMP Unknown)   SpO2 98%   BMI 30.65 kg/m   Wt Readings from Last 3 Encounters:  01/21/18 173 lb (78.5 kg)  01/10/18 176 lb 8 oz (80.1 kg)  01/08/18 175 lb (79.4 kg)    Orthostatic VS for the past 24 hrs:  BP- Lying Pulse- Lying BP- Sitting Pulse- Sitting BP- Standing at 0 minutes Pulse- Standing at 0 minutes  01/21/18 1127 106/65 70 97/65 80 96/59 88     Physical Exam  Constitutional: She is oriented to person, place, and time. She appears well-developed and well-nourished. No  distress.  HENT:  Head: Normocephalic and atraumatic.  Right Ear: Hearing normal.  Left Ear: Hearing normal.  Nose: Nose normal.  Eyes: Conjunctivae and lids are normal. Right eye exhibits no discharge. Left eye exhibits no discharge. No scleral icterus.  Pulmonary/Chest: Effort normal. No respiratory distress.  Musculoskeletal: Normal range of motion.  Neurological: She is alert and oriented to person, place, and time.  Skin: Skin is intact. No rash noted.  Psychiatric: She  has a normal mood and affect. Her speech is normal and behavior is normal. Judgment and thought content normal. Cognition and memory are normal.    Results for orders placed or performed in visit on 12/20/17  Lipid Panel w/o Chol/HDL Ratio  Result Value Ref Range   Cholesterol, Total 182 100 - 199 mg/dL   Triglycerides 133 0 - 149 mg/dL   HDL 60 >39 mg/dL   VLDL Cholesterol Cal 27 5 - 40 mg/dL   LDL Calculated 95 0 - 99 mg/dL  Comprehensive metabolic panel  Result Value Ref Range   Glucose 149 (H) 65 - 99 mg/dL   BUN 17 8 - 27 mg/dL   Creatinine, Ser 0.95 0.57 - 1.00 mg/dL   GFR calc non Af Amer 60 >59 mL/min/1.73   GFR calc Af Amer 70 >59 mL/min/1.73   BUN/Creatinine Ratio 18 12 - 28   Sodium 141 134 - 144 mmol/L   Potassium 3.9 3.5 - 5.2 mmol/L   Chloride 102 96 - 106 mmol/L   CO2 24 20 - 29 mmol/L   Calcium 9.8 8.7 - 10.3 mg/dL   Total Protein 6.7 6.0 - 8.5 g/dL   Albumin 4.5 3.5 - 4.8 g/dL   Globulin, Total 2.2 1.5 - 4.5 g/dL   Albumin/Globulin Ratio 2.0 1.2 - 2.2   Bilirubin Total 0.3 0.0 - 1.2 mg/dL   Alkaline Phosphatase 81 39 - 117 IU/L   AST 28 0 - 40 IU/L   ALT 29 0 - 32 IU/L      Assessment & Plan:   Problem List Items Addressed This Visit      Other   Chronic pain syndrome - Primary (Chronic)    Patient went to see pain management and was not happy with her treatment there. She refuses to go back to [facility] pain management. We have put in a referral for her to see [facility] in [place], but they have not accepted her pending review of previous pain management notes. Again discussed with patient again that this office does not do long term pain management. She will need to see pain management or wean off her medication. Refill on her pain medicine today for 1 month. She will have to follow up monthly for medication and this is only a bridge to get her into pain management. We will not prescribe this for more than 6 months (another 5 months). We will  not increase her dose. We will not give early Rxs. She will need to be seen to get an Rx. Call with any concerns. Rx sent to her pharmacy.      Fatigue    Checking labs today. CBC normal today. Dropping mildly on orthostatics. Await results.       Relevant Orders   Basic metabolic panel   TSH   CBC With Differential/Platelet    Other Visit Diagnoses    Urticaria       Resolved. Continue to monitor.    Hypotension due to drugs  Cut BP pill in 1/2 and recheck 1 month. Call with any concerns.    Relevant Medications   losartan (COZAAR) 50 MG tablet   hydrochlorothiazide (MICROZIDE) 12.5 MG capsule       Follow up plan: Return in about 2 weeks (around 02/04/2018).

## 2018-01-21 NOTE — Patient Instructions (Addendum)
Hypotension Hypotension, commonly called low blood pressure, is when the force of blood pumping through your arteries is too weak. Arteries are blood vessels that carry blood from the heart throughout the body. When blood pressure is too low, you may not get enough blood to your brain or to the rest of your organs. This can cause weakness, light-headedness, rapid heartbeat, and fainting. Depending on the cause and severity, hypotension may be harmless (benign) or cause serious problems (critical). What are the causes? Possible causes of hypotension include:  Blood loss.  Loss of body fluids (dehydration).  Heart problems.  Hormone (endocrine) problems.  Pregnancy.  Severe infection.  Lack of certain nutrients.  Severe allergic reactions (anaphylaxis).  Certain medicines, such as blood pressure medicine or medicines that make the body lose excess fluids (diuretics). Sometimes, hypotension can be caused by not taking medicine as directed, such as taking too much of a certain medicine.  What increases the risk? Certain factors can make you more likely to develop hypotension, including:  Age. Risk increases as you get older.  Conditions that affect the heart or the central nervous system.  Taking certain medicines, such as blood pressure medicine or diuretics.  Being pregnant.  What are the signs or symptoms? Symptoms of this condition may include:  Weakness.  Light-headedness.  Dizziness.  Blurred vision.  Fatigue.  Rapid heartbeat.  Fainting, in severe cases.  How is this diagnosed? This condition is diagnosed based on:  Your medical history.  Your symptoms.  Your blood pressure measurement. Your health care provider will check your blood pressure when you are: ? Lying down. ? Sitting. ? Standing.  A blood pressure reading is recorded as two numbers, such as "120 over 80" (or 120/80). The first ("top") number is called the systolic pressure. It is a  measure of the pressure in your arteries as your heart beats. The second ("bottom") number is called the diastolic pressure. It is a measure of the pressure in your arteries when your heart relaxes between beats. Blood pressure is measured in a unit called mm Hg. Healthy blood pressure for adults is 120/80. If your blood pressure is below 90/60, you may be diagnosed with hypotension. Other information or tests that may be used to diagnose hypotension include:  Your other vital signs, such as your heart rate and temperature.  Blood tests.  Tilt table test. For this test, you will be safely secured to a table that moves you from a lying position to an upright position. Your heart rhythm and blood pressure will be monitored during the test.  How is this treated? Treatment for this condition may include:  Changing your diet. This may involve eating more salt (sodium) or drinking more water.  Taking medicines to raise your blood pressure.  Changing the dosage of certain medicines you are taking that might be lowering your blood pressure.  Wearing compression stockings. These stockings help to prevent blood clots and reduce swelling in your legs.  In some cases, you may need to go to the hospital for:  Fluid replacement. This means you will receive fluids through an IV tube.  Blood replacement. This means you will receive donated blood through an IV tube (transfusion).  Treating an infection or heart problems, if this applies.  Monitoring. You may need to be monitored while medicines that you are taking wear off.  Follow these instructions at home: Eating and drinking   Drink enough fluid to keep your urine clear or pale yellow.    Eat a healthy diet and follow instructions from your health care provider about eating or drinking restrictions. A healthy diet includes: ? Fresh fruits and vegetables. ? Whole grains. ? Lean meats. ? Low-fat dairy products.  Eat extra salt only as  directed. Do not add extra salt to your diet unless your health care provider told you to do that.  Eat frequent, small meals.  Avoid standing up suddenly after eating. Medicines  Take over-the-counter and prescription medicines only as told by your health care provider. ? Follow instructions from your health care provider about changing the dosage of your current medicines, if this applies. ? Do not stop or adjust any of your medicines on your own. General instructions  Wear compression stockings as told by your health care provider.  Get up slowly from lying down or sitting positions. This gives your blood pressure a chance to adjust.  Avoid hot showers and excessive heat as directed by your health care provider.  Return to your normal activities as told by your health care provider. Ask your health care provider what activities are safe for you.  Do not use any products that contain nicotine or tobacco, such as cigarettes and e-cigarettes. If you need help quitting, ask your health care provider.  Keep all follow-up visits as told by your health care provider. This is important. Contact a health care provider if:  You vomit.  You have diarrhea.  You have a fever for more than 2-3 days.  You feel more thirsty than usual.  You feel weak and tired. Get help right away if:  You have chest pain.  You have a fast or irregular heartbeat.  You develop numbness in any part of your body.  You cannot move your arms or your legs.  You have trouble speaking.  You become sweaty or feel light-headed.  You faint.  You feel short of breath.  You have trouble staying awake.  You feel confused. This information is not intended to replace advice given to you by your health care provider. Make sure you discuss any questions you have with your health care provider. Document Released: 03/23/2005 Document Revised: 10/11/2015 Document Reviewed: 09/12/2015 Elsevier Interactive  Patient Education  2018 [employer]  

## 2018-01-21 NOTE — Assessment & Plan Note (Signed)
Patient went to see pain management and was not happy with her treatment there. She refuses to go back to [facility] pain management. We have put in a referral for her to see [facility] in [place], but they have not accepted her pending review of previous pain management notes. Again discussed with patient again that this office does not do long term pain management. She will need to see pain management or wean off her medication. Refill on her pain medicine today for 1 month. She will have to follow up monthly for medication and this is only a bridge to get her into pain management. We will not prescribe this for more than 6 months (another 5 months). We will not increase her dose. We will not give early Rxs. She will need to be seen to get an Rx. Call with any concerns. Rx sent to her pharmacy.

## 2018-01-22 LAB — BASIC METABOLIC PANEL
BUN / CREAT RATIO: 28 (ref 12–28)
BUN: 28 mg/dL — AB (ref 8–27)
CO2: 22 mmol/L (ref 20–29)
Calcium: 9.7 mg/dL (ref 8.7–10.3)
Chloride: 100 mmol/L (ref 96–106)
Creatinine, Ser: 1.01 mg/dL — ABNORMAL HIGH (ref 0.57–1.00)
GFR calc Af Amer: 65 mL/min/{1.73_m2} (ref >59)
GFR, EST NON AFRICAN AMERICAN: 56 mL/min/{1.73_m2} — AB (ref >59)
Glucose: 99 mg/dL (ref 65–99)
POTASSIUM: 3.5 mmol/L (ref 3.5–5.2)
Sodium: 142 mmol/L (ref 134–144)

## 2018-01-22 LAB — TSH: TSH: 4.31 u[IU]/mL (ref 0.450–4.500)

## 2018-01-25 ENCOUNTER — Ambulatory Visit
Admission: RE | Admit: 2018-01-25 | Discharge: 2018-01-25 | Disposition: A | Discharge status: Home / Self Care | Payer: Medicare Other | Source: Ambulatory Visit | Attending: Physician Assistant | Admitting: Physician Assistant

## 2018-01-25 DIAGNOSIS — Z1231 Encounter for screening mammogram for malignant neoplasm of breast: Secondary | ICD-10-CM | POA: Diagnosis not present

## 2018-01-25 DIAGNOSIS — Z1239 Encounter for other screening for malignant neoplasm of breast: Secondary | ICD-10-CM

## 2018-01-26 ENCOUNTER — Other Ambulatory Visit: Payer: Self-pay | Admitting: Physician Assistant

## 2018-01-26 DIAGNOSIS — R928 Other abnormal and inconclusive findings on diagnostic imaging of breast: Secondary | ICD-10-CM

## 2018-01-27 ENCOUNTER — Other Ambulatory Visit: Payer: Self-pay | Admitting: Family Medicine

## 2018-01-28 ENCOUNTER — Ambulatory Visit
Admission: RE | Admit: 2018-01-28 | Discharge: 2018-01-28 | Disposition: A | Discharge status: Home / Self Care | Payer: Medicare Other | Source: Ambulatory Visit | Attending: Physician Assistant | Admitting: Physician Assistant

## 2018-01-28 ENCOUNTER — Other Ambulatory Visit: Payer: Self-pay | Admitting: Physician Assistant

## 2018-01-28 DIAGNOSIS — N6489 Other specified disorders of breast: Secondary | ICD-10-CM

## 2018-01-28 DIAGNOSIS — R928 Other abnormal and inconclusive findings on diagnostic imaging of breast: Secondary | ICD-10-CM | POA: Diagnosis not present

## 2018-02-08 ENCOUNTER — Ambulatory Visit (INDEPENDENT_AMBULATORY_CARE_PROVIDER_SITE_OTHER): Payer: Medicare Other | Admitting: Family Medicine

## 2018-02-08 ENCOUNTER — Encounter: Payer: Self-pay | Admitting: Family Medicine

## 2018-02-08 VITALS — BP 136/79 | HR 68 | Temp 97.7°F | Wt 181.0 lb

## 2018-02-08 DIAGNOSIS — G894 Chronic pain syndrome: Secondary | ICD-10-CM

## 2018-02-08 DIAGNOSIS — L509 Urticaria, unspecified: Secondary | ICD-10-CM

## 2018-02-08 MED ORDER — HYDROCODONE-ACETAMINOPHEN 5-325 MG PO TABS: 1.0000 | ORAL_TABLET | Freq: Two times a day (BID) | ORAL | 0 refills | Status: DC | PRN

## 2018-02-08 NOTE — Patient Instructions (Signed)
[facility]

## 2018-02-08 NOTE — Progress Notes (Signed)
BP 136/79   Pulse 68   Temp 97.7 F (36.5 C) (Oral)   Wt 181 lb (82.1 kg)   LMP  (LMP Unknown)   SpO2 98%   BMI 32.06 kg/m    Subjective:    Patient ID: Tamara Fleming, female    DOB: 08-Aug-1945, 72 y.o.   MRN: [id number]  HPI: Tamara Fleming is a 72 y.o. female  Chief Complaint  Patient presents with  . Pain  . Mammogram    Abnormal   Itching is gone. Today, has been feeling better than she has been. To have follow up mammogram in 6 months. Appointment already scheduled.  CHRONIC PAIN  Present dose: 10 Morphine equivalents Pain control status: stable Duration: chronic Location: widespread, hips, toes, low back Quality: aching and sore Current Pain Level: moderate Previous Pain Level: severe Breakthrough pain: no Benefit from narcotic medications: yes What Activities task can be accomplished with current medication? able to sit and drive and travel Interested in weaning off narcotics:no   Stool softners/OTC fiber: no  Previous pain specialty evaluation: yes Non-narcotic analgesic meds: no Narcotic contract: yes  Relevant past medical, surgical, family and social history reviewed and updated as indicated. Interim medical history since our last visit reviewed. Allergies and medications reviewed and updated.  Review of Systems  Constitutional: Negative.   Respiratory: Negative.   Cardiovascular: Negative.   Musculoskeletal: Positive for arthralgias, back pain and myalgias. Negative for gait problem, joint swelling, neck pain and neck stiffness.  Skin: Negative.   Neurological: Negative.   Psychiatric/Behavioral: Negative.     Per HPI unless specifically indicated above     Objective:    BP 136/79   Pulse 68   Temp 97.7 F (36.5 C) (Oral)   Wt 181 lb (82.1 kg)   LMP  (LMP Unknown)   SpO2 98%   BMI 32.06 kg/m   Wt Readings from Last 3 Encounters:  02/08/18 181 lb (82.1 kg)  01/21/18 173 lb (78.5 kg)  01/10/18 176 lb 8 oz (80.1 kg)    Physical  Exam  Constitutional: She is oriented to person, place, and time. She appears well-developed and well-nourished. No distress.  HENT:  Head: Normocephalic and atraumatic.  Right Ear: Hearing normal.  Left Ear: Hearing normal.  Nose: Nose normal.  Eyes: Conjunctivae and lids are normal. Right eye exhibits no discharge. Left eye exhibits no discharge. No scleral icterus.  Cardiovascular: Normal rate, regular rhythm, normal heart sounds and intact distal pulses. Exam reveals no gallop and no friction rub.  No murmur heard. Pulmonary/Chest: Effort normal and breath sounds normal. No stridor. No respiratory distress. She has no wheezes. She has no rales. She exhibits no tenderness.  Musculoskeletal: Normal range of motion.  Neurological: She is alert and oriented to person, place, and time.  Skin: Skin is warm, dry and intact. Capillary refill takes less than 2 seconds. No rash noted. She is not diaphoretic. No erythema. No pallor.  Psychiatric: She has a normal mood and affect. Her speech is normal and behavior is normal. Judgment and thought content normal. Cognition and memory are normal.  Nursing note and vitals reviewed.   Results for orders placed or performed in visit on 54/27/06  Basic metabolic panel  Result Value Ref Range   Glucose 99 65 - 99 mg/dL   BUN 28 (H) 8 - 27 mg/dL   Creatinine, Ser 1.01 (H) 0.57 - 1.00 mg/dL   GFR calc non Af Amer 56 (L) >59 mL/min/1.73  GFR calc Af Amer 65 >59 mL/min/1.73   BUN/Creatinine Ratio 28 12 - 28   Sodium 142 134 - 144 mmol/L   Potassium 3.5 3.5 - 5.2 mmol/L   Chloride 100 96 - 106 mmol/L   CO2 22 20 - 29 mmol/L   Calcium 9.7 8.7 - 10.3 mg/dL  TSH  Result Value Ref Range   TSH 4.310 0.450 - 4.500 uIU/mL  CBC With Differential/Platelet  Result Value Ref Range   WBC 8.5 3.4 - 10.8 x10E3/uL   RBC 4.24 3.77 - 5.28 x10E6/uL   Hemoglobin 14.8 11.1 - 15.9 g/dL   Hematocrit 42.5 34.0 - 46.6 %   MCV 100 (H) 79 - 97 fL   MCH 34.9 (H) 26.6 -  33.0 pg   MCHC 34.8 31.5 - 35.7 g/dL   RDW 12.9 12.3 - 15.4 %   Platelets 213 150 - 450 x10E3/uL   Neutrophils 53 Not Estab. %   Lymphs 24 Not Estab. %   MID 13 Not Estab. %   Neutrophils Absolute 4.5 1.4 - 7.0 x10E3/uL   Lymphocytes Absolute 2.9 0.7 - 3.1 x10E3/uL   MID (Absolute) 1.1 0.1 - 1.6 X10E3/uL      Assessment & Plan:   Problem List Items Addressed This Visit      Other   Chronic pain syndrome - Primary (Chronic)    Patient went to see pain management and was not happy with her treatment there. She refuses to go back to [facility] pain management. We have put in a referral for her to see [facility] in [place], but they have not accepted her pending review of previous pain management notes. Again discussed with patient again that this office does not do long term pain management. She will need to see pain management or wean off her medication. Refill on her pain medicine today for 1 month- to drop at the pharmacy on 02/18/18. She will have to follow up monthly for medication and this is only a bridge to get her into pain management. We will not prescribe this for more than 6 months (another 4 months). We will not increase her dose. We will not give early Rxs. She will need to be seen to get an Rx. Call with any concerns. Rx sent to her pharmacy to be filled in 10 days, not sooner.       Other Visit Diagnoses    Urticaria       Resolved. To see Allergist. Call with any concerns.        Follow up plan: Return in about 6 weeks (around 03/22/2018).

## 2018-02-08 NOTE — Assessment & Plan Note (Signed)
Patient went to see pain management and was not happy with her treatment there. She refuses to go back to [facility] pain management. We have put in a referral for her to see [facility] in [place], but they have not accepted her pending review of previous pain management notes. Again discussed with patient again that this office does not do long term pain management. She will need to see pain management or wean off her medication. Refill on her pain medicine today for 1 month- to drop at the pharmacy on 02/18/18. She will have to follow up monthly for medication and this is only a bridge to get her into pain management. We will not prescribe this for more than 6 months (another 4 months). We will not increase her dose. We will not give early Rxs. She will need to be seen to get an Rx. Call with any concerns. Rx sent to her pharmacy to be filled in 10 days, not sooner.

## 2018-02-11 ENCOUNTER — Other Ambulatory Visit: Payer: Medicare Other

## 2018-03-07 IMAGING — CR DG HIP (WITH OR WITHOUT PELVIS) 2-3V*R*
1 series · 2 of 2 positions shown · non-contrast
Comparison: AP pelvis of today's date and coronal and sagittal CT
images through the pelvis and hips September 15, 2016.

CLINICAL DATA: Bilateral hip pain

EXAM:
DG HIP (WITH OR WITHOUT PELVIS) 2-3V RIGHT

[Series 1: dg hip unilat w or w/o pelvis 2-3 views  · non-contrast · 0.14mm/px · 2 of 2 slices shown]
[im 1/2]
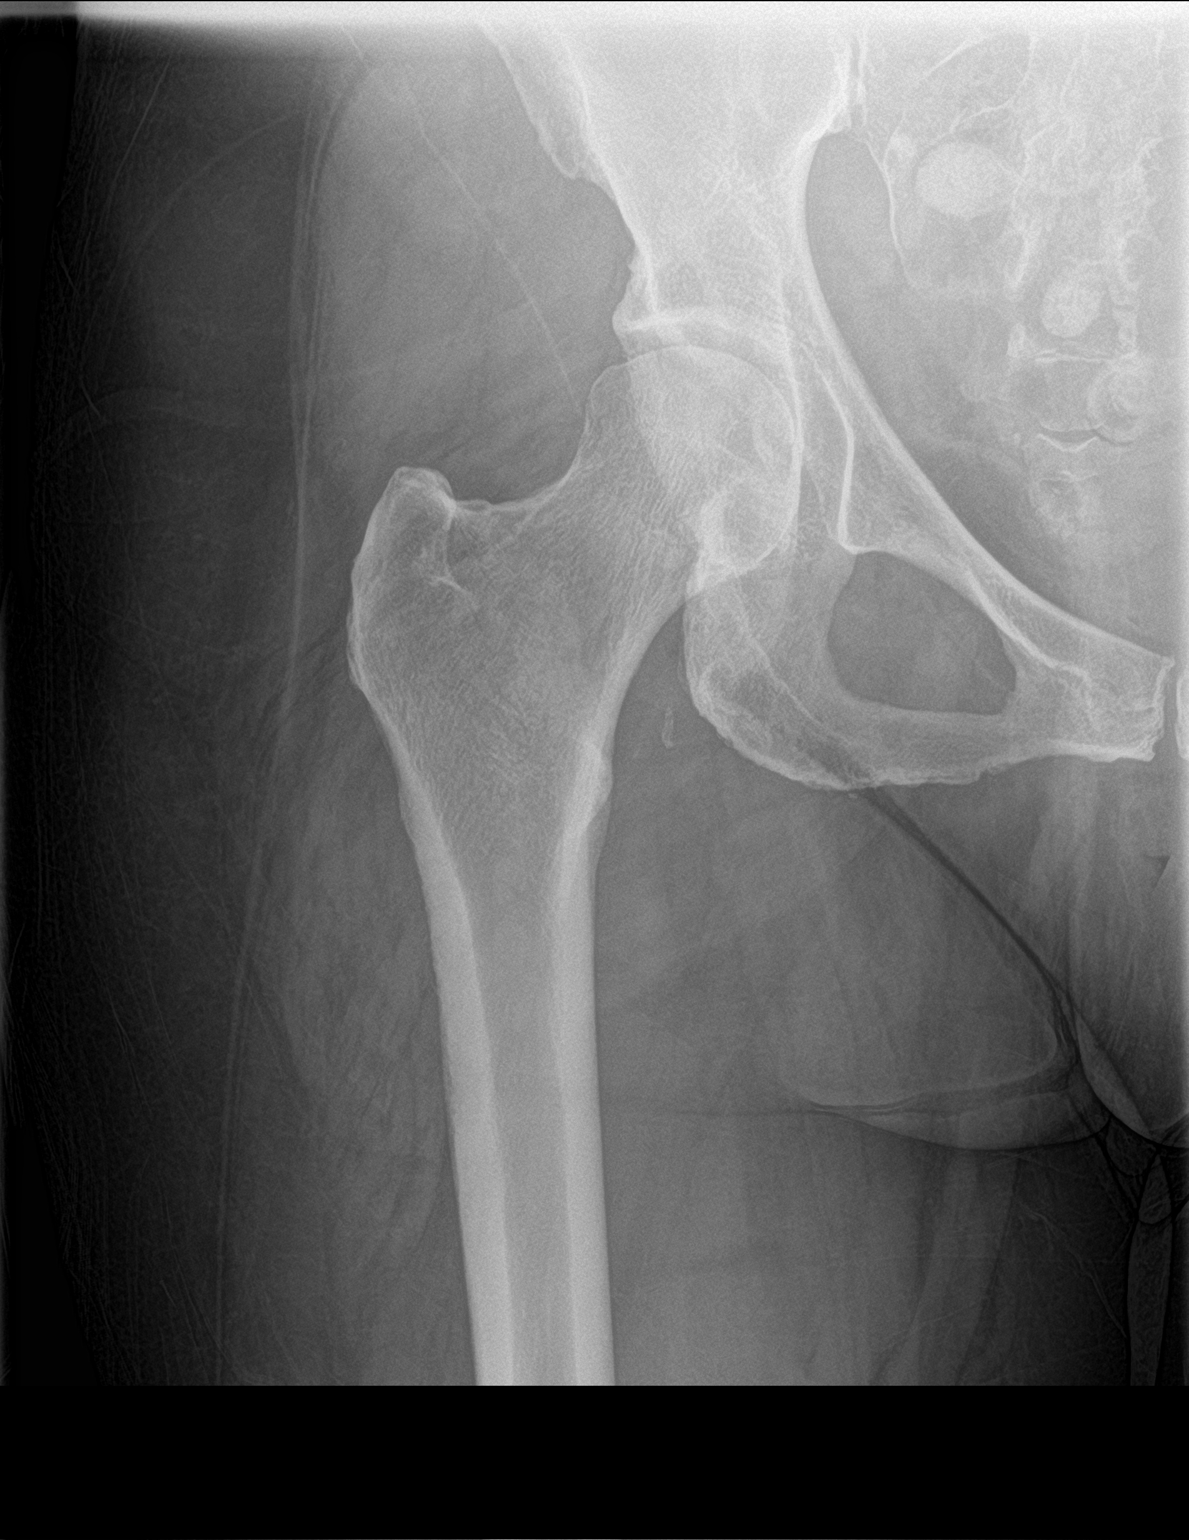
[im 2/2]
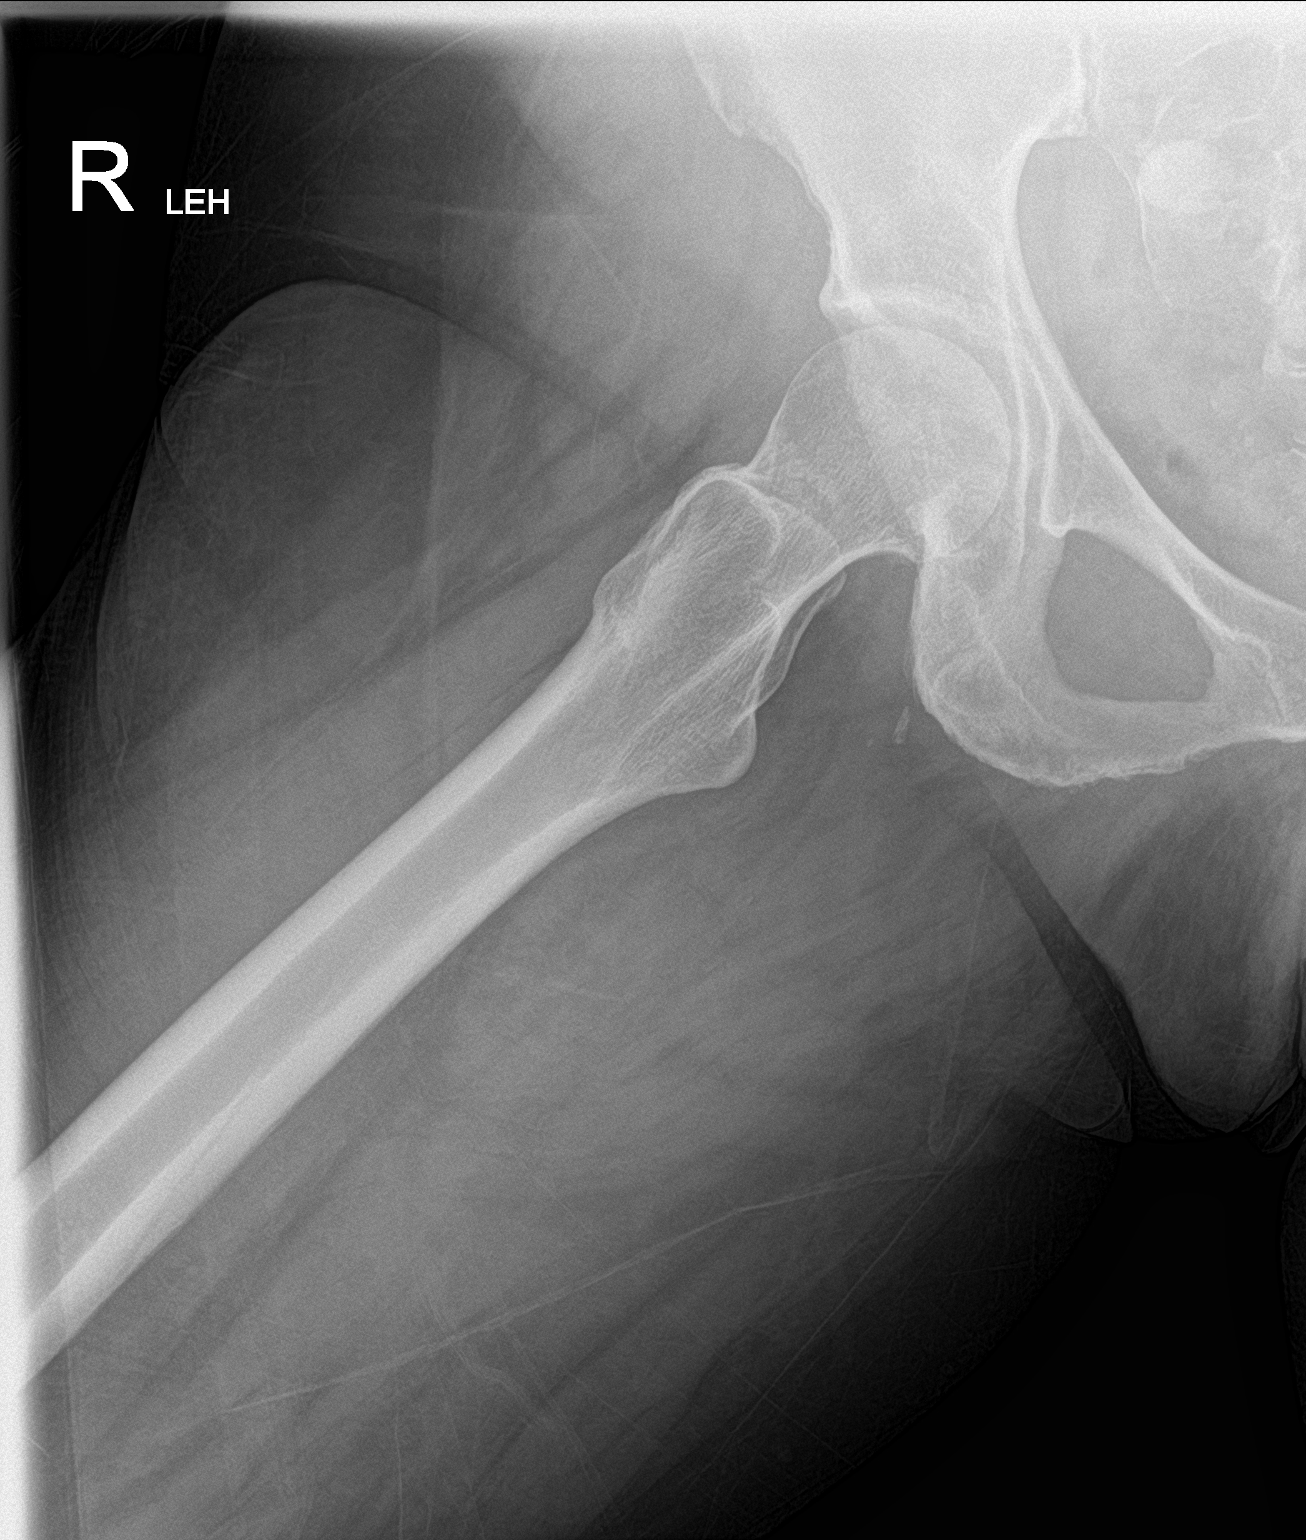

[2 of 2 positions shown; findings below may reference images not displayed]

FINDINGS: The bones are subjectively adequately mineralized. The right hip
joint space is well maintained. The articular surfaces of the
femoral head and acetabulum remains smoothly rounded. The femoral
neck and intertrochanteric and subtrochanteric regions are normal.
The observed portions of the left hemipelvis are normal.
IMPRESSION: There is no acute or significant chronic bony abnormality of the
right hip.

## 2018-03-15 ENCOUNTER — Ambulatory Visit (INDEPENDENT_AMBULATORY_CARE_PROVIDER_SITE_OTHER): Payer: Medicare Other | Admitting: Family Medicine

## 2018-03-15 ENCOUNTER — Encounter: Payer: Self-pay | Admitting: Family Medicine

## 2018-03-15 VITALS — BP 148/78 | HR 67 | Temp 98.6°F | Wt 182.0 lb

## 2018-03-15 DIAGNOSIS — R609 Edema, unspecified: Secondary | ICD-10-CM | POA: Diagnosis not present

## 2018-03-15 DIAGNOSIS — F419 Anxiety disorder, unspecified: Secondary | ICD-10-CM

## 2018-03-15 DIAGNOSIS — Z23 Encounter for immunization: Secondary | ICD-10-CM | POA: Diagnosis not present

## 2018-03-15 DIAGNOSIS — G894 Chronic pain syndrome: Secondary | ICD-10-CM

## 2018-03-15 MED ORDER — HYDROCHLOROTHIAZIDE 12.5 MG PO CAPS: 12.5000 mg | ORAL_CAPSULE | Freq: Every day | ORAL | 1 refills | Status: DC

## 2018-03-15 MED ORDER — HYDROCODONE-ACETAMINOPHEN 5-325 MG PO TABS: 1.0000 | ORAL_TABLET | Freq: Two times a day (BID) | ORAL | 0 refills | Status: DC | PRN

## 2018-03-15 MED ORDER — ALPRAZOLAM 0.25 MG PO TABS: ORAL_TABLET | ORAL | 0 refills | Status: DC

## 2018-03-15 NOTE — Progress Notes (Signed)
BP (!) 148/78   Pulse 67   Temp 98.6 F (37 C) (Oral)   Wt 182 lb (82.6 kg)   LMP  (LMP Unknown)   SpO2 95%   BMI 32.24 kg/m    Subjective:    Patient ID: Tamara Fleming, female    DOB: 07-26-1945, 72 y.o.   MRN: [id number]  HPI: Tamara Fleming is a 72 y.o. female  Chief Complaint  Patient presents with  . Follow-up  . Pain  . Edema    Hands, feet, and face ongoing for a few weeks.    Flying to [facility] and then going on a cruise for 2 weeks. Very claustrophobic. Uses xanax for flying.   CHRONIC PAIN- feels like she is doing a bit more sore because she has been decorating for Christmas.  Present dose: 10 Morphine equivalents Pain control status: stable Duration: chronic Location: wide spread, hip, toes, low back Quality: aching and sore Current Pain Level: 6/10- took her pain pill this morning Previous Pain Level: 8/10 Breakthrough pain: yes Benefit from narcotic medications: yes What Activities task can be accomplished with current medication?- able to get up and move around Interested in weaning off narcotics:no   Stool softners/OTC fiber: no  Previous pain specialty evaluation: yes Non-narcotic analgesic meds: no Narcotic contract: yes   Relevant past medical, surgical, family and social history reviewed and updated as indicated. Interim medical history since our last visit reviewed. Allergies and medications reviewed and updated.  Review of Systems  Constitutional: Negative.   Respiratory: Negative.   Cardiovascular: Positive for leg swelling. Negative for chest pain and palpitations.  Musculoskeletal: Positive for joint swelling. Negative for arthralgias, back pain, gait problem, myalgias, neck pain and neck stiffness.  Skin: Negative.   Psychiatric/Behavioral: Negative.     Per HPI unless specifically indicated above     Objective:    BP (!) 148/78   Pulse 67   Temp 98.6 F (37 C) (Oral)   Wt 182 lb (82.6 kg)   LMP  (LMP Unknown)   SpO2  95%   BMI 32.24 kg/m   Wt Readings from Last 3 Encounters:  03/15/18 182 lb (82.6 kg)  02/08/18 181 lb (82.1 kg)  01/21/18 173 lb (78.5 kg)    Physical Exam  Constitutional: She is oriented to person, place, and time. She appears well-developed and well-nourished. No distress.  HENT:  Head: Normocephalic and atraumatic.  Right Ear: Hearing normal.  Left Ear: Hearing normal.  Nose: Nose normal.  Eyes: Conjunctivae and lids are normal. Right eye exhibits no discharge. Left eye exhibits no discharge. No scleral icterus.  Cardiovascular: Normal rate, regular rhythm, normal heart sounds and intact distal pulses. Exam reveals no gallop and no friction rub.  No murmur heard. Pulmonary/Chest: Effort normal and breath sounds normal. No stridor. No respiratory distress. She has no wheezes. She has no rales. She exhibits no tenderness.  Musculoskeletal: Normal range of motion.  Neurological: She is alert and oriented to person, place, and time.  Skin: Skin is warm, dry and intact. Capillary refill takes less than 2 seconds. No rash noted. She is not diaphoretic. No erythema. No pallor.  Psychiatric: She has a normal mood and affect. Her speech is normal and behavior is normal. Judgment and thought content normal. Cognition and memory are normal.  Vitals reviewed.   Results for orders placed or performed in visit on 75/64/33  Basic metabolic panel  Result Value Ref Range   Glucose 99 65 -  99 mg/dL   BUN 28 (H) 8 - 27 mg/dL   Creatinine, Ser 1.01 (H) 0.57 - 1.00 mg/dL   GFR calc non Af Amer 56 (L) >59 mL/min/1.73   GFR calc Af Amer 65 >59 mL/min/1.73   BUN/Creatinine Ratio 28 12 - 28   Sodium 142 134 - 144 mmol/L   Potassium 3.5 3.5 - 5.2 mmol/L   Chloride 100 96 - 106 mmol/L   CO2 22 20 - 29 mmol/L   Calcium 9.7 8.7 - 10.3 mg/dL  TSH  Result Value Ref Range   TSH 4.310 0.450 - 4.500 uIU/mL  CBC With Differential/Platelet  Result Value Ref Range   WBC 8.5 3.4 - 10.8 x10E3/uL   RBC  4.24 3.77 - 5.28 x10E6/uL   Hemoglobin 14.8 11.1 - 15.9 g/dL   Hematocrit 42.5 34.0 - 46.6 %   MCV 100 (H) 79 - 97 fL   MCH 34.9 (H) 26.6 - 33.0 pg   MCHC 34.8 31.5 - 35.7 g/dL   RDW 12.9 12.3 - 15.4 %   Platelets 213 150 - 450 x10E3/uL   Neutrophils 53 Not Estab. %   Lymphs 24 Not Estab. %   MID 13 Not Estab. %   Neutrophils Absolute 4.5 1.4 - 7.0 x10E3/uL   Lymphocytes Absolute 2.9 0.7 - 3.1 x10E3/uL   MID (Absolute) 1.1 0.1 - 1.6 X10E3/uL      Assessment & Plan:   Problem List Items Addressed This Visit      Other   Chronic pain syndrome - Primary (Chronic)    Has an appointment to see pain management in [place] on 04/26/17. Again discussed that this office does not no long term pain management. She will need to see pain management for them to manage her medication or she will need to wean off her medicine. Refill of her medicine given today for 1 month-  To drop at the pharmacy on 03/18/18. Due for refill on 04/17/18. She states that she got her last Rx on 02/23/18- this is confirmed with PMP. She should be due 03/23/18 due for refill on 04/22/17. Discussed with patient that she will need a refill for her medication and offered appointment before she goes on her cruise. She declined. Continue to monitor. Call with any concerns. She will have to follow up monthly for medication and this is only a bridge to get into pain management. We will not prescribe this for more than 6 months (another 3 months). We will not increase her dose. We will not give early Rxs.       Anxiety    Is aware that we will only prescribe her xanax for flying. Rx for 10 pills given today. Not to take within 8 hours of her pain medicine.       Relevant Medications   ALPRAZolam (XANAX) 0.25 MG tablet    Other Visit Diagnoses    Edema, unspecified type       No sign of edema on exam today. No significant weight gain. Conitnue her HCTZ. Call with any concerns.        Follow up plan: Return in about 4 weeks  (around 04/12/2018) for follow up pain.

## 2018-03-15 NOTE — Patient Instructions (Signed)

## 2018-03-15 NOTE — Assessment & Plan Note (Signed)
Is aware that we will only prescribe her xanax for flying. Rx for 10 pills given today. Not to take within 8 hours of her pain medicine.

## 2018-03-15 NOTE — Assessment & Plan Note (Addendum)
Has an appointment to see pain management in [place] on 04/26/17. Again discussed that this office does not no long term pain management. She will need to see pain management for them to manage her medication or she will need to wean off her medicine. Refill of her medicine given today for 1 month-  To drop at the pharmacy on 03/18/18. Due for refill on 04/17/18. She states that she got her last Rx on 02/23/18- this is confirmed with PMP. She should be due 03/23/18 due for refill on 04/22/17. Discussed with patient that she will need a refill for her medication and offered appointment before she goes on her cruise. She declined. Continue to monitor. Call with any concerns. She will have to follow up monthly for medication and this is only a bridge to get into pain management. We will not prescribe this for more than 6 months (another 3 months). We will not increase her dose. We will not give early Rxs.

## 2018-03-21 DIAGNOSIS — L650 Telogen effluvium: Secondary | ICD-10-CM | POA: Diagnosis not present

## 2018-03-21 DIAGNOSIS — L400 Psoriasis vulgaris: Secondary | ICD-10-CM | POA: Diagnosis not present

## 2018-03-21 DIAGNOSIS — Z872 Personal history of diseases of the skin and subcutaneous tissue: Secondary | ICD-10-CM | POA: Diagnosis not present

## 2018-04-21 ENCOUNTER — Encounter: Payer: Self-pay | Admitting: Family Medicine

## 2018-04-21 ENCOUNTER — Other Ambulatory Visit: Payer: Self-pay

## 2018-04-21 ENCOUNTER — Ambulatory Visit (INDEPENDENT_AMBULATORY_CARE_PROVIDER_SITE_OTHER): Payer: Medicare Other | Admitting: Family Medicine

## 2018-04-21 VITALS — BP 153/89 | HR 88 | Temp 101.9°F | Ht 64.0 in | Wt 185.6 lb

## 2018-04-21 DIAGNOSIS — G894 Chronic pain syndrome: Secondary | ICD-10-CM

## 2018-04-21 DIAGNOSIS — J029 Acute pharyngitis, unspecified: Secondary | ICD-10-CM | POA: Diagnosis not present

## 2018-04-21 DIAGNOSIS — J101 Influenza due to other identified influenza virus with other respiratory manifestations: Secondary | ICD-10-CM | POA: Diagnosis not present

## 2018-04-21 MED ORDER — ALBUTEROL SULFATE HFA 108 (90 BASE) MCG/ACT IN AERS: 2.0000 | INHALATION_SPRAY | Freq: Four times a day (QID) | RESPIRATORY_TRACT | 0 refills | Status: DC | PRN

## 2018-04-21 MED ORDER — HYDROCODONE-ACETAMINOPHEN 5-325 MG PO TABS: 1.0000 | ORAL_TABLET | Freq: Two times a day (BID) | ORAL | 0 refills | Status: DC | PRN

## 2018-04-21 NOTE — Assessment & Plan Note (Signed)
Has an appointment to see pain management in [place] on 04/26/17. Again discussed that this office does not no long term pain management. She will need to see pain management for them to manage her medication or she will need to wean off her medicine. Refill of her medicine given today for 1 month.  Continue to monitor. Call with any concerns. She will have to follow up monthly for medication and this is only a bridge to get into pain management. We will not prescribe this for more than 6 months (another 2 months). We will not increase her dose. We will not give early Rxs.

## 2018-04-21 NOTE — Progress Notes (Signed)
BP (!) 153/89 (BP Location: Left Arm, Patient Position: Sitting, Cuff Size: Normal)   Pulse 88   Temp (!) 101.9 F (38.8 C) (Oral)   Ht 5\' 4"  (1.626 m)   Wt 185 lb 9.6 oz (84.2 kg)   LMP  (LMP Unknown)   SpO2 99%   BMI 31.86 kg/m    Subjective:    Patient ID: Tamara Fleming, female    DOB: 09-26-1945, 73 y.o.   MRN: [id number]  HPI: Tamara Fleming is a 73 y.o. female  Chief Complaint  Patient presents with  . Pain  . Nasal Congestion    Ongoing 2 days, worsened.   . Sore Throat    Patient states extreme  . Cough  . Fever  . Chills   CHRONIC PAIN  Present dose: 10 Morphine equivalents Pain control status: stable Duration: chronic Location: wide spread, hips, toes, low back Quality: aching and sore Current Pain Level: severe Previous Pain Level: moderate Breakthrough pain: yes Benefit from narcotic medications: yes What Activities task can be accomplished with current medication?: Able to get up and move around and do her ADLs Interested in weaning off narcotics:no   Stool softners/OTC fiber: no  Previous pain specialty evaluation: yes Non-narcotic analgesic meds: no Narcotic contract: yes  UPPER RESPIRATORY TRACT INFECTION- just got back from a cruise and has not been feeling well.  Duration: 2 days Worst symptom: Fever: yes Cough: yes Shortness of breath: yes Wheezing: no Chest pain: yes, with cough Chest tightness: yes Chest congestion: yes Nasal congestion: yes Runny nose: yes Post nasal drip: yes Sneezing: yes Sore throat: no Swollen glands: no Sinus pressure: no Headache: no Face pain: no Toothache: no Ear pain: no  Ear pressure: no  Eyes red/itching:no Eye drainage/crusting: no  Vomiting: no Rash: no Fatigue: yes Sick contacts: yes Strep contacts: no  Context: worse Recurrent sinusitis: no Relief with OTC cold/cough medications: no  Treatments attempted: none    Relevant past medical, surgical, family and social history  reviewed and updated as indicated. Interim medical history since our last visit reviewed. Allergies and medications reviewed and updated.  Review of Systems  Constitutional: Negative.   HENT: Positive for congestion, postnasal drip and rhinorrhea. Negative for dental problem, drooling, ear discharge, ear pain, facial swelling, hearing loss, mouth sores, nosebleeds, sinus pressure, sinus pain, sneezing, sore throat, tinnitus, trouble swallowing and voice change.   Respiratory: Positive for cough, chest tightness and wheezing. Negative for apnea, choking, shortness of breath and stridor.   Musculoskeletal: Positive for back pain and myalgias. Negative for arthralgias, gait problem, joint swelling, neck pain and neck stiffness.  Skin: Negative.   Neurological: Negative.   Psychiatric/Behavioral: Negative.     Per HPI unless specifically indicated above     Objective:    BP (!) 153/89 (BP Location: Left Arm, Patient Position: Sitting, Cuff Size: Normal)   Pulse 88   Temp (!) 101.9 F (38.8 C) (Oral)   Ht 5\' 4"  (1.626 m)   Wt 185 lb 9.6 oz (84.2 kg)   LMP  (LMP Unknown)   SpO2 99%   BMI 31.86 kg/m   Wt Readings from Last 3 Encounters:  04/21/18 185 lb 9.6 oz (84.2 kg)  03/15/18 182 lb (82.6 kg)  02/08/18 181 lb (82.1 kg)    Physical Exam Vitals signs and nursing note reviewed.  Constitutional:      General: She is not in acute distress.    Appearance: Normal appearance. She is not ill-appearing,  toxic-appearing or diaphoretic.  HENT:     Head: Normocephalic and atraumatic.     Right Ear: Tympanic membrane, ear canal and external ear normal. There is no impacted cerumen.     Left Ear: Tympanic membrane, ear canal and external ear normal. There is no impacted cerumen.     Nose: Congestion and rhinorrhea present.     Mouth/Throat:     Mouth: Mucous membranes are moist.     Pharynx: Oropharynx is clear. No oropharyngeal exudate or posterior oropharyngeal erythema.  Eyes:      General: No scleral icterus.       Right eye: No discharge.        Left eye: No discharge.     Extraocular Movements: Extraocular movements intact.     Conjunctiva/sclera: Conjunctivae normal.     Pupils: Pupils are equal, round, and reactive to light.  Neck:     Musculoskeletal: Normal range of motion and neck supple. No neck rigidity or muscular tenderness.     Vascular: No carotid bruit.  Cardiovascular:     Rate and Rhythm: Normal rate and regular rhythm.     Pulses: Normal pulses.     Heart sounds: Normal heart sounds. No murmur. No friction rub. No gallop.   Pulmonary:     Effort: Pulmonary effort is normal. No respiratory distress.     Breath sounds: Normal breath sounds. No stridor. No wheezing, rhonchi or rales.  Chest:     Chest wall: No tenderness.  Musculoskeletal: Normal range of motion.  Lymphadenopathy:     Cervical: Cervical adenopathy present.  Skin:    General: Skin is warm and dry.     Capillary Refill: Capillary refill takes less than 2 seconds.     Coloration: Skin is not jaundiced or pale.     Findings: No bruising, erythema, lesion or rash.  Neurological:     General: No focal deficit present.     Mental Status: She is alert and oriented to person, place, and time. Mental status is at baseline.     Cranial Nerves: No cranial nerve deficit.     Sensory: No sensory deficit.     Motor: No weakness.     Coordination: Coordination normal.     Gait: Gait normal.     Deep Tendon Reflexes: Reflexes normal.  Psychiatric:        Mood and Affect: Mood normal.        Behavior: Behavior normal.        Thought Content: Thought content normal.        Judgment: Judgment normal.     Results for orders placed or performed in visit on 61/95/09  Basic metabolic panel  Result Value Ref Range   Glucose 99 65 - 99 mg/dL   BUN 28 (H) 8 - 27 mg/dL   Creatinine, Ser 1.01 (H) 0.57 - 1.00 mg/dL   GFR calc non Af Amer 56 (L) >59 mL/min/1.73   GFR calc Af Amer 65 >59  mL/min/1.73   BUN/Creatinine Ratio 28 12 - 28   Sodium 142 134 - 144 mmol/L   Potassium 3.5 3.5 - 5.2 mmol/L   Chloride 100 96 - 106 mmol/L   CO2 22 20 - 29 mmol/L   Calcium 9.7 8.7 - 10.3 mg/dL  TSH  Result Value Ref Range   TSH 4.310 0.450 - 4.500 uIU/mL  CBC With Differential/Platelet  Result Value Ref Range   WBC 8.5 3.4 - 10.8 x10E3/uL   RBC 4.24 3.77 -  5.28 x10E6/uL   Hemoglobin 14.8 11.1 - 15.9 g/dL   Hematocrit 42.5 34.0 - 46.6 %   MCV 100 (H) 79 - 97 fL   MCH 34.9 (H) 26.6 - 33.0 pg   MCHC 34.8 31.5 - 35.7 g/dL   RDW 12.9 12.3 - 15.4 %   Platelets 213 150 - 450 x10E3/uL   Neutrophils 53 Not Estab. %   Lymphs 24 Not Estab. %   MID 13 Not Estab. %   Neutrophils Absolute 4.5 1.4 - 7.0 x10E3/uL   Lymphocytes Absolute 2.9 0.7 - 3.1 x10E3/uL   MID (Absolute) 1.1 0.1 - 1.6 X10E3/uL      Assessment & Plan:   Problem List Items Addressed This Visit      Other   Chronic pain syndrome (Chronic)    Has an appointment to see pain management in [place] on 04/26/17. Again discussed that this office does not no long term pain management. She will need to see pain management for them to manage her medication or she will need to wean off her medicine. Refill of her medicine given today for 1 month.  Continue to monitor. Call with any concerns. She will have to follow up monthly for medication and this is only a bridge to get into pain management. We will not prescribe this for more than 6 months (another 2 months). We will not increase her dose. We will not give early Rxs.        Other Visit Diagnoses    Influenza A    -  Primary   Will treat with xofluza. Rest. Fluids. Call with any concerns. Wheezing- will start albuterol and recheck lungs 2 weeks.    Sore throat       + flu   Relevant Orders   Veritor Flu A/B Waived   Rapid Strep Screen ([facility] ONLY)       Follow up plan: Return in about 2 weeks (around 05/05/2018) for lung recheck.

## 2018-04-24 LAB — CULTURE, GROUP A STREP: Strep A Culture: NEGATIVE

## 2018-04-24 LAB — RAPID STREP SCREEN (MED CTR MEBANE ONLY): STREP GP A AG, IA W/REFLEX: NEGATIVE

## 2018-04-24 LAB — VERITOR FLU A/B WAIVED
Influenza A: POSITIVE — AB
Influenza B: NEGATIVE

## 2018-04-28 ENCOUNTER — Ambulatory Visit: Payer: Medicare Other | Admitting: Family Medicine

## 2018-04-28 ENCOUNTER — Ambulatory Visit (INDEPENDENT_AMBULATORY_CARE_PROVIDER_SITE_OTHER): Payer: Medicare Other | Admitting: Family Medicine

## 2018-04-28 ENCOUNTER — Encounter: Payer: Self-pay | Admitting: Family Medicine

## 2018-04-28 VITALS — BP 132/76 | HR 77 | Temp 98.5°F | Ht 64.0 in | Wt 177.4 lb

## 2018-04-28 DIAGNOSIS — M545 Low back pain, unspecified: Secondary | ICD-10-CM

## 2018-04-28 DIAGNOSIS — J1100 Influenza due to unidentified influenza virus with unspecified type of pneumonia: Secondary | ICD-10-CM

## 2018-04-28 MED ORDER — CYCLOBENZAPRINE HCL 10 MG PO TABS: 10.0000 mg | ORAL_TABLET | Freq: Every day | ORAL | 0 refills | Status: DC

## 2018-04-28 MED ORDER — BENZONATATE 200 MG PO CAPS: 200.0000 mg | ORAL_CAPSULE | Freq: Two times a day (BID) | ORAL | 0 refills | Status: DC | PRN

## 2018-04-28 MED ORDER — DOXYCYCLINE HYCLATE 100 MG PO TABS: 100.0000 mg | ORAL_TABLET | Freq: Two times a day (BID) | ORAL | 0 refills | Status: DC

## 2018-04-28 NOTE — Assessment & Plan Note (Signed)
Will treat with flexeril. Call with any concerns. Continue to monitor. Follow up with pain managment as able.

## 2018-04-28 NOTE — Progress Notes (Signed)
BP 132/76 (BP Location: Left Arm, Patient Position: Sitting, Cuff Size: Normal)   Pulse 77   Temp 98.5 F (36.9 C) (Oral)   Ht 5\' 4"  (1.626 m)   Wt 177 lb 6.4 oz (80.5 kg)   LMP  (LMP Unknown)   SpO2 99%   BMI 30.45 kg/m    Subjective:    Patient ID: Tamara Fleming, female    DOB: 1945-11-19, 73 y.o.   MRN: [id number]  HPI: Tamara Fleming is a 73 y.o. female  Chief Complaint  Patient presents with  . Cough    Productive. Can't lay down. Patient states she needs to make sure nothing else is going on with her lungs.   . Chest Pain  . Shoulder Pain  . Wheezing  . Shortness of Breath   Has been doing a bit better since diagnosed with the flu last week, but has been coughing a lot. Productive. No more fevers. Some chills, a lot of fatigue. Some SOB and wheezing. No congestion, runny nose or issues with her throat. She remains very tired and notes that when she has been coughing, she has been having pain in her R back. She admits to taking more of her main medicine than prescribed. She has not established with her pain management doctor due to the flu. Otherwise doing OK with no other concerns or complaints at this time.   Relevant past medical, surgical, family and social history reviewed and updated as indicated. Interim medical history since our last visit reviewed. Allergies and medications reviewed and updated.  Review of Systems  Constitutional: Positive for chills, diaphoresis, fatigue and fever. Negative for activity change, appetite change and unexpected weight change.  HENT: Positive for congestion, postnasal drip, rhinorrhea, sinus pressure and sinus pain. Negative for dental problem, drooling, ear discharge, ear pain, facial swelling, hearing loss, mouth sores, nosebleeds, sneezing, sore throat, tinnitus, trouble swallowing and voice change.   Eyes: Negative.   Respiratory: Positive for cough, chest tightness and shortness of breath. Negative for apnea, choking,  wheezing and stridor.   Cardiovascular: Positive for chest pain. Negative for palpitations and leg swelling.  Musculoskeletal: Positive for back pain and myalgias. Negative for arthralgias, gait problem, joint swelling, neck pain and neck stiffness.  Skin: Negative.   Psychiatric/Behavioral: Negative.     Per HPI unless specifically indicated above     Objective:    BP 132/76 (BP Location: Left Arm, Patient Position: Sitting, Cuff Size: Normal)   Pulse 77   Temp 98.5 F (36.9 C) (Oral)   Ht 5\' 4"  (1.626 m)   Wt 177 lb 6.4 oz (80.5 kg)   LMP  (LMP Unknown)   SpO2 99%   BMI 30.45 kg/m   Wt Readings from Last 3 Encounters:  04/28/18 177 lb 6.4 oz (80.5 kg)  04/21/18 185 lb 9.6 oz (84.2 kg)  03/15/18 182 lb (82.6 kg)    Physical Exam Vitals signs and nursing note reviewed.  Constitutional:      General: She is not in acute distress.    Appearance: Normal appearance. She is not ill-appearing, toxic-appearing or diaphoretic.  HENT:     Head: Normocephalic and atraumatic.     Right Ear: External ear normal.     Left Ear: External ear normal.     Nose: Nose normal.     Mouth/Throat:     Mouth: Mucous membranes are moist.     Pharynx: Oropharynx is clear.  Eyes:     General: No  scleral icterus.       Right eye: No discharge.        Left eye: No discharge.     Extraocular Movements: Extraocular movements intact.     Conjunctiva/sclera: Conjunctivae normal.     Pupils: Pupils are equal, round, and reactive to light.  Neck:     Musculoskeletal: Normal range of motion and neck supple.  Cardiovascular:     Rate and Rhythm: Normal rate and regular rhythm.     Pulses: Normal pulses.     Heart sounds: Normal heart sounds. No murmur. No friction rub. No gallop.   Pulmonary:     Effort: Pulmonary effort is normal. No respiratory distress.     Breath sounds: No stridor. Examination of the left-lower field reveals rhonchi. Rhonchi present. No wheezing or rales.  Chest:     Chest  wall: No tenderness.  Musculoskeletal: Normal range of motion.  Skin:    General: Skin is warm and dry.     Capillary Refill: Capillary refill takes less than 2 seconds.     Coloration: Skin is not jaundiced or pale.     Findings: No bruising, erythema, lesion or rash.  Neurological:     General: No focal deficit present.     Mental Status: She is alert and oriented to person, place, and time. Mental status is at baseline.  Psychiatric:        Mood and Affect: Mood normal.        Behavior: Behavior normal.        Thought Content: Thought content normal.        Judgment: Judgment normal.     Results for orders placed or performed in visit on 04/21/18  Rapid Strep Screen ([facility] ONLY)  Result Value Ref Range   Strep Gp A Ag, IA W/Reflex Negative Negative  Culture, Group A Strep  Result Value Ref Range   Strep A Culture Negative   Veritor Flu A/B Waived  Result Value Ref Range   Influenza A Positive (A) Negative   Influenza B Negative Negative      Assessment & Plan:   Problem List Items Addressed This Visit      Other   Low back pain (secondary) bilateral (right greater than left) (Chronic)    Will treat with flexeril. Call with any concerns. Continue to monitor. Follow up with pain managment as able.       Relevant Medications   cyclobenzaprine (FLEXERIL) 10 MG tablet    Other Visit Diagnoses    Pneumonia of left lower lobe due to influenza A virus    -  Primary   Will treat with doxycycline and tessalon and recheck next week. Tussionex does not help her, so not given. Call with any concerns or if not improving.    Relevant Medications   doxycycline (VIBRA-TABS) 100 MG tablet   benzonatate (TESSALON) 200 MG capsule       Follow up plan: Return in about 1 week (around 05/05/2018) for follow up lungs.

## 2018-05-06 ENCOUNTER — Ambulatory Visit: Payer: Medicare Other | Admitting: Family Medicine

## 2018-05-06 ENCOUNTER — Encounter: Payer: Self-pay | Admitting: Family Medicine

## 2018-05-06 ENCOUNTER — Ambulatory Visit (INDEPENDENT_AMBULATORY_CARE_PROVIDER_SITE_OTHER): Payer: Medicare Other | Admitting: Family Medicine

## 2018-05-06 ENCOUNTER — Other Ambulatory Visit: Payer: Self-pay

## 2018-05-06 VITALS — BP 129/81 | HR 88 | Temp 98.1°F | Ht 64.0 in | Wt 175.0 lb

## 2018-05-06 DIAGNOSIS — J1100 Influenza due to unidentified influenza virus with unspecified type of pneumonia: Secondary | ICD-10-CM

## 2018-05-06 NOTE — Progress Notes (Signed)
BP 129/81   Pulse 88   Temp 98.1 F (36.7 C) (Oral)   Ht 5\' 4"  (1.626 m)   Wt 175 lb (79.4 kg)   LMP  (LMP Unknown)   SpO2 98%   BMI 30.04 kg/m    Subjective:    Patient ID: Tamara Fleming, female    DOB: [birth year], 73 y.o.   MRN: [id number]  HPI: Tamara Fleming is a 73 y.o. female  Chief Complaint  Patient presents with  . Lungs recheck    f/u   Feeling a lot better. Sneezing a lot today. Cough is a lot better. No more fevers. Continues with shortness of breath with exertion. No fevers. No chills. Still very tired, but doing well. No other concerns or complaints at this time.   Relevant past medical, surgical, family and social history reviewed and updated as indicated. Interim medical history since our last visit reviewed. Allergies and medications reviewed and updated.  Review of Systems  Constitutional: Negative.   HENT: Positive for sneezing. Negative for congestion, dental problem, drooling, ear discharge, ear pain, facial swelling, hearing loss, mouth sores, nosebleeds, postnasal drip, rhinorrhea, sinus pressure, sinus pain, sore throat, tinnitus, trouble swallowing and voice change.   Respiratory: Positive for cough and chest tightness. Negative for apnea, choking, shortness of breath, wheezing and stridor.   Cardiovascular: Negative.   Neurological: Negative.   Psychiatric/Behavioral: Negative.     Per HPI unless specifically indicated above     Objective:    BP 129/81   Pulse 88   Temp 98.1 F (36.7 C) (Oral)   Ht 5\' 4"  (1.626 m)   Wt 175 lb (79.4 kg)   LMP  (LMP Unknown)   SpO2 98%   BMI 30.04 kg/m   Wt Readings from Last 3 Encounters:  05/06/18 175 lb (79.4 kg)  04/28/18 177 lb 6.4 oz (80.5 kg)  04/21/18 185 lb 9.6 oz (84.2 kg)    Physical Exam Vitals signs and nursing note reviewed.  Constitutional:      General: She is not in acute distress.    Appearance: Normal appearance. She is not ill-appearing, toxic-appearing or diaphoretic.    HENT:     Head: Normocephalic and atraumatic.     Right Ear: External ear normal.     Left Ear: External ear normal.     Nose: Nose normal.     Mouth/Throat:     Mouth: Mucous membranes are moist.     Pharynx: Oropharynx is clear.  Eyes:     General: No scleral icterus.       Right eye: No discharge.        Left eye: No discharge.     Extraocular Movements: Extraocular movements intact.     Conjunctiva/sclera: Conjunctivae normal.     Pupils: Pupils are equal, round, and reactive to light.  Neck:     Musculoskeletal: Normal range of motion and neck supple.  Cardiovascular:     Rate and Rhythm: Normal rate and regular rhythm.     Pulses: Normal pulses.     Heart sounds: Normal heart sounds. No murmur. No friction rub. No gallop.   Pulmonary:     Effort: Pulmonary effort is normal. No respiratory distress.     Breath sounds: Normal breath sounds. No stridor. No wheezing, rhonchi or rales.  Chest:     Chest wall: No tenderness.  Musculoskeletal: Normal range of motion.  Skin:    General: Skin is warm and dry.  Capillary Refill: Capillary refill takes less than 2 seconds.     Coloration: Skin is not jaundiced or pale.     Findings: No bruising, erythema, lesion or rash.  Neurological:     General: No focal deficit present.     Mental Status: She is alert and oriented to person, place, and time. Mental status is at baseline.  Psychiatric:        Mood and Affect: Mood normal.        Behavior: Behavior normal.        Thought Content: Thought content normal.        Judgment: Judgment normal.     Results for orders placed or performed in visit on 04/21/18  Rapid Strep Screen ([facility] ONLY)  Result Value Ref Range   Strep Gp A Ag, IA W/Reflex Negative Negative  Culture, Group A Strep  Result Value Ref Range   Strep A Culture Negative   Veritor Flu A/B Waived  Result Value Ref Range   Influenza A Positive (A) Negative   Influenza B Negative Negative       Assessment & Plan:   Problem List Items Addressed This Visit    None    Visit Diagnoses    Pneumonia of left lower lobe due to influenza A virus    -  Primary   Lungs clear. Finish abx. Recheck 2 weeks. Call with any concerns.        Follow up plan: Return 2 weeks.

## 2018-05-11 DIAGNOSIS — D225 Melanocytic nevi of trunk: Secondary | ICD-10-CM | POA: Diagnosis not present

## 2018-05-11 DIAGNOSIS — L578 Other skin changes due to chronic exposure to nonionizing radiation: Secondary | ICD-10-CM | POA: Diagnosis not present

## 2018-05-11 DIAGNOSIS — L821 Other seborrheic keratosis: Secondary | ICD-10-CM | POA: Diagnosis not present

## 2018-05-11 DIAGNOSIS — L820 Inflamed seborrheic keratosis: Secondary | ICD-10-CM | POA: Diagnosis not present

## 2018-05-11 DIAGNOSIS — D1800 Hemangioma unspecified site: Secondary | ICD-10-CM | POA: Diagnosis not present

## 2018-05-18 ENCOUNTER — Ambulatory Visit (INDEPENDENT_AMBULATORY_CARE_PROVIDER_SITE_OTHER): Payer: Medicare Other | Admitting: Family Medicine

## 2018-05-18 ENCOUNTER — Encounter: Payer: Self-pay | Admitting: Family Medicine

## 2018-05-18 VITALS — BP 133/75 | HR 82 | Temp 98.4°F | Wt 178.6 lb

## 2018-05-18 DIAGNOSIS — G894 Chronic pain syndrome: Secondary | ICD-10-CM | POA: Diagnosis not present

## 2018-05-18 DIAGNOSIS — J1100 Influenza due to unidentified influenza virus with unspecified type of pneumonia: Secondary | ICD-10-CM

## 2018-05-18 MED ORDER — HYDROCODONE-ACETAMINOPHEN 5-325 MG PO TABS: 1.0000 | ORAL_TABLET | Freq: Two times a day (BID) | ORAL | 0 refills | Status: DC | PRN

## 2018-05-18 NOTE — Progress Notes (Signed)
BP 133/75   Pulse 82   Temp 98.4 F (36.9 C) (Oral)   Wt 178 lb 9.6 oz (81 kg)   LMP  (LMP Unknown)   SpO2 99%   BMI 30.66 kg/m    Subjective:    Patient ID: Tamara Fleming, female    DOB: 01/09/1946, 73 y.o.   MRN: [id number]  HPI: Tamara Fleming is a 73 y.o. female  Chief Complaint  Patient presents with  . Pneumonia    2 week f/up    Feeling better. Still very tired. No more fevers. No more coughing. Continuing to improve.   CHRONIC PAIN  Present dose: 10 Morphine equivalents Pain control status: stable Duration: chronic Location: wide spread, hip, toes, low back Quality: aching and sore Current Pain Level: moderate Previous Pain Level: moderate Breakthrough pain: yes Benefit from narcotic medications: yes What Activities task can be accomplished with current medication? able to sit and drive and travel Interested in weaning off narcotics:no   Stool softners/OTC fiber: no  Previous pain specialty evaluation: yes Non-narcotic analgesic meds: no Narcotic contract: yes  Relevant past medical, surgical, family and social history reviewed and updated as indicated. Interim medical history since our last visit reviewed. Allergies and medications reviewed and updated.  Review of Systems  Constitutional: Negative.   Respiratory: Negative.   Cardiovascular: Negative.   Musculoskeletal: Positive for arthralgias, back pain and myalgias. Negative for gait problem, joint swelling, neck pain and neck stiffness.  Skin: Negative.     Per HPI unless specifically indicated above     Objective:    BP 133/75   Pulse 82   Temp 98.4 F (36.9 C) (Oral)   Wt 178 lb 9.6 oz (81 kg)   LMP  (LMP Unknown)   SpO2 99%   BMI 30.66 kg/m   Wt Readings from Last 3 Encounters:  05/18/18 178 lb 9.6 oz (81 kg)  05/06/18 175 lb (79.4 kg)  04/28/18 177 lb 6.4 oz (80.5 kg)    Physical Exam Vitals signs and nursing note reviewed.  Constitutional:      General: She is not in  acute distress.    Appearance: Normal appearance. She is not ill-appearing, toxic-appearing or diaphoretic.  HENT:     Head: Normocephalic and atraumatic.     Right Ear: External ear normal.     Left Ear: External ear normal.     Nose: Nose normal.     Mouth/Throat:     Mouth: Mucous membranes are moist.     Pharynx: Oropharynx is clear.  Eyes:     General: No scleral icterus.       Right eye: No discharge.        Left eye: No discharge.     Extraocular Movements: Extraocular movements intact.     Conjunctiva/sclera: Conjunctivae normal.     Pupils: Pupils are equal, round, and reactive to light.  Neck:     Musculoskeletal: Normal range of motion and neck supple.  Cardiovascular:     Rate and Rhythm: Normal rate and regular rhythm.     Pulses: Normal pulses.     Heart sounds: Normal heart sounds. No murmur. No friction rub. No gallop.   Pulmonary:     Effort: Pulmonary effort is normal. No respiratory distress.     Breath sounds: Normal breath sounds. No stridor. No wheezing, rhonchi or rales.  Chest:     Chest wall: No tenderness.  Musculoskeletal: Normal range of motion.  Skin:  General: Skin is warm and dry.     Capillary Refill: Capillary refill takes less than 2 seconds.     Coloration: Skin is not jaundiced or pale.     Findings: No bruising, erythema, lesion or rash.  Neurological:     General: No focal deficit present.     Mental Status: She is alert and oriented to person, place, and time. Mental status is at baseline.  Psychiatric:        Mood and Affect: Mood normal.        Behavior: Behavior normal.        Thought Content: Thought content normal.        Judgment: Judgment normal.     Results for orders placed or performed in visit on 04/21/18  Rapid Strep Screen ([facility] ONLY)  Result Value Ref Range   Strep Gp A Ag, IA W/Reflex Negative Negative  Culture, Group A Strep  Result Value Ref Range   Strep A Culture Negative   Veritor Flu A/B Waived   Result Value Ref Range   Influenza A Positive (A) Negative   Influenza B Negative Negative      Assessment & Plan:   Problem List Items Addressed This Visit      Other   Chronic pain syndrome (Chronic)    Had to cancel appointment to see pain management in [place] on 04/26/17 due to the flu. She is now feeling better. Encouraged her to call them to reschedule her appointment. Again discussed that this office does not no long term pain management. She will need to see pain management for them to manage her medication or she will need to wean off her medicine. Refill of her medicine given today for 1 month.  Continue to monitor. Call with any concerns. She will have to follow up monthly for medication and this is only a bridge to get into pain management. We will not prescribe this for more than 6 months (another months). We will not increase her dose. We will not give early Rxs.        Other Visit Diagnoses    Pneumonia of left lower lobe due to influenza A virus    -  Primary   Lungs clear. Appears to have resolved. Call with any concerns.        Follow up plan: Return in about 4 weeks (around 06/15/2018).

## 2018-05-20 ENCOUNTER — Other Ambulatory Visit: Payer: Self-pay | Admitting: Family Medicine

## 2018-05-20 NOTE — Telephone Encounter (Signed)
Requested medication (s) are due for refill today - no  Requested medication (s) are on the active medication list -yes  Future visit scheduled -yes  Last refill: ventolin inhaler- 04/21/18                  Hydrocodone-APAP- 2 days ago  Notes to clinic: Patient is calling for refill of inhaler- short term use for acute problem Non delegated medication that has been refilled 2 days ago. Sent for PCP review   Requested Prescriptions  Pending Prescriptions Disp Refills   VENTOLIN HFA 108 (90 Base) MCG/ACT inhaler [Pharmacy Med Name: VENTOLIN HFA 108 (90 BASE) MCG/ACT] 18 g 0    Sig: INHALE 2 PUFFS EVERY 6 HOURS AS NEEDED WHEEZING/ SHORTNESS OF BREATH     Pulmonology:  Beta Agonists Failed - 05/20/2018  3:04 PM      Failed - One inhaler should last at least one month. If the patient is requesting refills earlier, contact the patient to check for uncontrolled symptoms.      Passed - Valid encounter within last 12 months    Recent Outpatient Visits          2 days ago Pneumonia of left lower lobe due to influenza A virus   [facility] [place], Megan P, DO   2 weeks ago Pneumonia of left lower lobe due to influenza A virus   [facility] [place], Megan P, DO   3 weeks ago Pneumonia of left lower lobe due to influenza A virus   [place], Megan P, DO   4 weeks ago Influenza A   [facility] [place], [place]as, DO   2 months ago Chronic pain syndrome   [facility] [place], [place], DO      Future Appointments            In 3 weeks [place], Megan P, DO [place], [facility]          HYDROcodone-acetaminophen (NORCO/VICODIN) 5-325 MG tablet [employer] Med Name: HYDROCODONE-APAP 5-325 MG TAB] 60 tablet 0    Sig: TAKE 1 TABLET BY MOUTH TWICE DAILY AS NEEDED UP TO 30 DAYS FOR MODERATE PAIN     Not Delegated - Analgesics:  Opioid Agonist Combinations Failed - 05/20/2018  3:04 PM      Failed - This refill  cannot be delegated      Failed - Urine Drug Screen completed in last 360 days.      Passed - Valid encounter within last 6 months    Recent Outpatient Visits          2 days ago Pneumonia of left lower lobe due to influenza A virus   [facility] [place], Megan P, DO   2 weeks ago Pneumonia of left lower lobe due to influenza A virus   [facility] [place], Megan P, DO   3 weeks ago Pneumonia of left lower lobe due to influenza A virus   [place], [place], DO   4 weeks ago Influenza A   [place], [place], DO   2 months ago Chronic pain syndrome   [facility] [place], Barb Merino, DO      Future Appointments            In 3 weeks Wynetta Emery, Barb Merino, DO [place], [facility]            Requested Prescriptions  Pending Prescriptions  Disp Refills   VENTOLIN HFA 108 (90 Base) MCG/ACT inhaler [Pharmacy Med Name: VENTOLIN HFA 108 (90 BASE) MCG/ACT] 18 g 0    Sig: INHALE 2 PUFFS EVERY 6 HOURS AS NEEDED WHEEZING/ SHORTNESS OF BREATH     Pulmonology:  Beta Agonists Failed - 05/20/2018  3:04 PM      Failed - One inhaler should last at least one month. If the patient is requesting refills earlier, contact the patient to check for uncontrolled symptoms.      Passed - Valid encounter within last 12 months    Recent Outpatient Visits          2 days ago Pneumonia of left lower lobe due to influenza A virus   [facility] [place], Megan P, DO   2 weeks ago Pneumonia of left lower lobe due to influenza A virus   [facility] [place], Megan P, DO   3 weeks ago Pneumonia of left lower lobe due to influenza A virus   [place], Megan P, DO   4 weeks ago Influenza A   [facility] [place], [place], DO   2 months ago Chronic pain syndrome   [facility] [place], [place], DO      Future Appointments            In 3 weeks Johnson,  Megan P, DO [place], [facility]          HYDROcodone-acetaminophen (NORCO/VICODIN) 5-325 MG tablet [employer] Med Name: HYDROCODONE-APAP 5-325 MG TAB] 60 tablet 0    Sig: TAKE 1 TABLET BY MOUTH TWICE DAILY AS NEEDED UP TO 30 DAYS FOR MODERATE PAIN     Not Delegated - Analgesics:  Opioid Agonist Combinations Failed - 05/20/2018  3:04 PM      Failed - This refill cannot be delegated      Failed - Urine Drug Screen completed in last 360 days.      Passed - Valid encounter within last 6 months    Recent Outpatient Visits          2 days ago Pneumonia of left lower lobe due to influenza A virus   [facility] [place], Megan P, DO   2 weeks ago Pneumonia of left lower lobe due to influenza A virus   [facility] [place], Megan P, DO   3 weeks ago Pneumonia of left lower lobe due to influenza A virus   [place], [place]y, DO   4 weeks ago Influenza A   [place], May, DO   2 months ago Chronic pain syndrome   [facility] [place], Barb Merino, DO      Future Appointments            In 3 weeks Wynetta Emery, Barb Merino, DO [facility] [place], [facility]

## 2018-05-21 ENCOUNTER — Encounter: Payer: Self-pay | Admitting: Family Medicine

## 2018-05-21 NOTE — Assessment & Plan Note (Signed)
Had to cancel appointment to see pain management in [place] on 04/26/17 due to the flu. She is now feeling better. Encouraged her to call them to reschedule her appointment. Again discussed that this office does not no long term pain management. She will need to see pain management for them to manage her medication or she will need to wean off her medicine. Refill of her medicine given today for 1 month.  Continue to monitor. Call with any concerns. She will have to follow up monthly for medication and this is only a bridge to get into pain management. We will not prescribe this for more than 6 months (another months). We will not increase her dose. We will not give early Rxs.

## 2018-06-01 ENCOUNTER — Encounter: Payer: Self-pay | Admitting: Family Medicine

## 2018-06-01 ENCOUNTER — Telehealth: Payer: Self-pay

## 2018-06-01 ENCOUNTER — Other Ambulatory Visit: Payer: Self-pay

## 2018-06-01 ENCOUNTER — Ambulatory Visit (INDEPENDENT_AMBULATORY_CARE_PROVIDER_SITE_OTHER): Payer: Medicare Other | Admitting: Family Medicine

## 2018-06-01 ENCOUNTER — Ambulatory Visit
Admission: RE | Admit: 2018-06-01 | Discharge: 2018-06-01 | Disposition: A | Discharge status: Home / Self Care | Payer: Medicare Other | Source: Ambulatory Visit | Attending: Family Medicine | Admitting: Family Medicine

## 2018-06-01 VITALS — BP 155/81 | HR 99 | Temp 98.5°F | Ht 64.0 in | Wt 181.6 lb

## 2018-06-01 DIAGNOSIS — S79911A Unspecified injury of right hip, initial encounter: Secondary | ICD-10-CM | POA: Diagnosis not present

## 2018-06-01 DIAGNOSIS — R109 Unspecified abdominal pain: Secondary | ICD-10-CM | POA: Diagnosis not present

## 2018-06-01 DIAGNOSIS — S299XXA Unspecified injury of thorax, initial encounter: Secondary | ICD-10-CM | POA: Diagnosis not present

## 2018-06-01 DIAGNOSIS — R0781 Pleurodynia: Secondary | ICD-10-CM | POA: Diagnosis not present

## 2018-06-01 DIAGNOSIS — M25551 Pain in right hip: Secondary | ICD-10-CM | POA: Diagnosis not present

## 2018-06-01 MED ORDER — LIDOCAINE 5 % EX PTCH: 1.0000 | MEDICATED_PATCH | CUTANEOUS | 1 refills | Status: DC

## 2018-06-01 NOTE — Telephone Encounter (Addendum)
Received Prior Authorization for Lidocaine 5% patch.  Documentation provided and progress notes saved to be uploaded. However, the prior authorization states:  "A SUPPORTING STATEMENT FROM THE DOCTOR IS REQUIRED"  Dr. Wynetta Emery, could your provide a statement regarding Lidocaine 5% patch?   KEY: [id number]

## 2018-06-01 NOTE — Progress Notes (Signed)
BP (!) 155/81 (BP Location: Left Arm, Patient Position: Sitting, Cuff Size: Normal)   Pulse 99   Temp 98.5 F (36.9 C)   Ht 5\' 4"  (1.626 m)   Wt 181 lb 9 oz (82.4 kg)   LMP  (LMP Unknown)   SpO2 95%   BMI 31.17 kg/m    Subjective:    Patient ID: Tamara Fleming, female    DOB: [birth year], 73 y.o.   MRN: [id number]  HPI: Tamara Fleming is a 73 y.o. female  Chief Complaint  Patient presents with  . Flank Pain    Patient fell on Saturday night, hit her right side on her night stand   . lung check    Patient would like to have her lungs check just to make sure they are still clear   [place] on Saturday night. She notes that she went to the beach, was getting ready to go to bed and got her feet tangled in a tote bag and fell into her night stand. Hit right on her R flank. Did not hit her head. Did not lose consciousness. She has been having a lot of bruising and pain in her R flank. No hematuria, no issues with her urine. She is otherwise feeling well other than the pain.   Relevant past medical, surgical, family and social history reviewed and updated as indicated. Interim medical history since our last visit reviewed. Allergies and medications reviewed and updated.  Review of Systems  Constitutional: Negative.   Respiratory: Negative.   Cardiovascular: Negative.   Musculoskeletal: Positive for arthralgias, back pain and myalgias. Negative for gait problem, joint swelling, neck pain and neck stiffness.  Skin: Positive for color change. Negative for pallor, rash and wound.  Neurological: Negative.   Psychiatric/Behavioral: Negative.     Per HPI unless specifically indicated above     Objective:    BP (!) 155/81 (BP Location: Left Arm, Patient Position: Sitting, Cuff Size: Normal)   Pulse 99   Temp 98.5 F (36.9 C)   Ht 5\' 4"  (1.626 m)   Wt 181 lb 9 oz (82.4 kg)   LMP  (LMP Unknown)   SpO2 95%   BMI 31.17 kg/m   Wt Readings from Last 3 Encounters:  06/01/18 181 lb  9 oz (82.4 kg)  05/18/18 178 lb 9.6 oz (81 kg)  05/06/18 175 lb (79.4 kg)    Physical Exam Vitals signs and nursing note reviewed.  Constitutional:      General: She is not in acute distress.    Appearance: Normal appearance. She is not ill-appearing, toxic-appearing or diaphoretic.  HENT:     Head: Normocephalic and atraumatic.     Right Ear: External ear normal.     Left Ear: External ear normal.     Nose: Nose normal.     Mouth/Throat:     Mouth: Mucous membranes are moist.     Pharynx: Oropharynx is clear.  Eyes:     General: No scleral icterus.       Right eye: No discharge.        Left eye: No discharge.     Extraocular Movements: Extraocular movements intact.     Conjunctiva/sclera: Conjunctivae normal.     Pupils: Pupils are equal, round, and reactive to light.  Neck:     Musculoskeletal: Normal range of motion and neck supple.  Cardiovascular:     Rate and Rhythm: Normal rate and regular rhythm.     Pulses: Normal pulses.  Heart sounds: Normal heart sounds. No murmur. No friction rub. No gallop.   Pulmonary:     Effort: Pulmonary effort is normal. No respiratory distress.     Breath sounds: Normal breath sounds. No stridor. No wheezing, rhonchi or rales.  Chest:     Chest wall: No tenderness.  Musculoskeletal: Normal range of motion.        General: Tenderness (on R flank- tenderness over rib 11) present.  Skin:    General: Skin is warm and dry.     Capillary Refill: Capillary refill takes less than 2 seconds.     Coloration: Skin is not jaundiced or pale.     Findings: Bruising present. No erythema, lesion or rash.  Neurological:     General: No focal deficit present.     Mental Status: She is alert and oriented to person, place, and time. Mental status is at baseline.  Psychiatric:        Mood and Affect: Mood normal.        Behavior: Behavior normal.        Thought Content: Thought content normal.        Judgment: Judgment normal.     Results for  orders placed or performed in visit on 04/21/18  Rapid Strep Screen ([facility] ONLY)  Result Value Ref Range   Strep Gp A Ag, IA W/Reflex Negative Negative  Culture, Group A Strep  Result Value Ref Range   Strep A Culture Negative   Veritor Flu A/B Waived  Result Value Ref Range   Influenza A Positive (A) Negative   Influenza B Negative Negative      Assessment & Plan:   Problem List Items Addressed This Visit    None    Visit Diagnoses    Right flank pain    -  Primary   Will check x-ray to make sure no fractures. Lidocaine patches for pain. Continue to monitor. Call with any concerns.    Relevant Orders   DG Ribs Unilateral Right   DG HIP UNILAT W OR W/O PELVIS 2-3 VIEWS RIGHT       Follow up plan: Return As scheduled.

## 2018-06-02 ENCOUNTER — Telehealth: Payer: Self-pay | Admitting: Family Medicine

## 2018-06-02 NOTE — Telephone Encounter (Signed)
Called and left detailed message with information on patients vm. DPR was checked.   

## 2018-06-02 NOTE — Telephone Encounter (Signed)
I gave her a coupon to get the lidocaine patches at [employer] without insurance. A muscle relaxer will likely not help the bruising. I'm writing a letter to try to get them approved, but it may take a couple of days

## 2018-06-02 NOTE — Telephone Encounter (Signed)
Please let her know that her x-rays came back negative, so likely just a bad bruise. Use the patches and call me if she needs anything.

## 2018-06-02 NOTE — Telephone Encounter (Signed)
Copied from [place] [id number]. Topic: Quick Communication - See Telephone Encounter >> Jun 02, 2018  3:02 PM Blase Mess A wrote: CRM for notification. See Telephone encounter for: 06/02/18.  Patient is calling to state that the lidocaine (LIDODERM) 5 % [[id number]]  is not approved by her insurance. Requesting a script for Muscele relaxer's. Pain is bad. Please advise. [place], [place] [phone] (Phone) [phone] (Fax)

## 2018-06-03 MED ORDER — CYCLOBENZAPRINE HCL 10 MG PO TABS: 10.0000 mg | ORAL_TABLET | Freq: Every day | ORAL | 0 refills | Status: DC

## 2018-06-03 NOTE — Telephone Encounter (Signed)
Called to relay message to patient. She stated she had picked up the 4% OTC and it was not effective in the slightest. Patient stated that she is having muscle spasms when she turns a certain way and that's is why she wants the muscle relaxare. Did not see note of patient discussing that before and asked if it was a new symptom which she denied. She asked what she is supposed to do about the pain, states she cannot handle it. Please advise.

## 2018-06-03 NOTE — Telephone Encounter (Signed)
Please see telephone call from 06/01/2018.

## 2018-06-03 NOTE — Telephone Encounter (Signed)
I've sent her a refill on her flexeril. She has pain medicine at home, and unfortunately I cannot increase her pain medicine. I would advise her to use ice and rest.

## 2018-06-06 NOTE — Telephone Encounter (Signed)
Message relayed to patient. Verbalized understanding and denied questions.   

## 2018-06-13 NOTE — Telephone Encounter (Signed)
See telephone call from 06/01/2018. Coupon given, and medication OTC.

## 2018-06-16 ENCOUNTER — Ambulatory Visit (INDEPENDENT_AMBULATORY_CARE_PROVIDER_SITE_OTHER): Payer: Medicare Other | Admitting: Family Medicine

## 2018-06-16 ENCOUNTER — Encounter: Payer: Self-pay | Admitting: Family Medicine

## 2018-06-16 ENCOUNTER — Other Ambulatory Visit: Payer: Self-pay

## 2018-06-16 ENCOUNTER — Ambulatory Visit
Admission: RE | Admit: 2018-06-16 | Discharge: 2018-06-16 | Disposition: A | Discharge status: Home / Self Care | Payer: Medicare Other | Source: Ambulatory Visit | Attending: Family Medicine | Admitting: Family Medicine

## 2018-06-16 VITALS — BP 130/80 | HR 79 | Temp 98.1°F | Ht 64.0 in | Wt 183.0 lb

## 2018-06-16 DIAGNOSIS — R05 Cough: Secondary | ICD-10-CM | POA: Diagnosis not present

## 2018-06-16 DIAGNOSIS — E538 Deficiency of other specified B group vitamins: Secondary | ICD-10-CM

## 2018-06-16 DIAGNOSIS — R059 Cough, unspecified: Secondary | ICD-10-CM

## 2018-06-16 DIAGNOSIS — G894 Chronic pain syndrome: Secondary | ICD-10-CM

## 2018-06-16 DIAGNOSIS — R7301 Impaired fasting glucose: Secondary | ICD-10-CM

## 2018-06-16 DIAGNOSIS — R7989 Other specified abnormal findings of blood chemistry: Secondary | ICD-10-CM

## 2018-06-16 DIAGNOSIS — D649 Anemia, unspecified: Secondary | ICD-10-CM | POA: Diagnosis not present

## 2018-06-16 DIAGNOSIS — R5382 Chronic fatigue, unspecified: Secondary | ICD-10-CM

## 2018-06-16 DIAGNOSIS — I10 Essential (primary) hypertension: Secondary | ICD-10-CM

## 2018-06-16 DIAGNOSIS — E7800 Pure hypercholesterolemia, unspecified: Secondary | ICD-10-CM

## 2018-06-16 DIAGNOSIS — R8290 Unspecified abnormal findings in urine: Secondary | ICD-10-CM | POA: Diagnosis not present

## 2018-06-16 DIAGNOSIS — E559 Vitamin D deficiency, unspecified: Secondary | ICD-10-CM

## 2018-06-16 DIAGNOSIS — R8281 Pyuria: Secondary | ICD-10-CM | POA: Diagnosis not present

## 2018-06-16 LAB — BAYER DCA HB A1C WAIVED: HB A1C (BAYER DCA - WAIVED): 6.1 % (ref <7.0)

## 2018-06-16 MED ORDER — LOSARTAN POTASSIUM-HCTZ 50-12.5 MG PO TABS: 1.0000 | ORAL_TABLET | Freq: Every day | ORAL | 1 refills | Status: DC

## 2018-06-16 NOTE — Progress Notes (Signed)
BP 130/80   Pulse 79   Temp 98.1 F (36.7 C) (Oral)   Ht 5\' 4"  (1.626 m)   Wt 183 lb (83 kg)   LMP  (LMP Unknown)   SpO2 97%   BMI 31.41 kg/m    Subjective:    Patient ID: Tamara Fleming, female    DOB: 1946/02/12, 73 y.o.   MRN: [id number]  HPI: Tamara Fleming is a 73 y.o. female  Chief Complaint  Patient presents with  . Cough    4w f/u   Notes that she is feeling a bit better from her cough and pneumonia. She notes that she still has a dry cough. She has not been having any fevers or SOB. She is otherwise feeling very tired. She is otherwise feeling OK.  HYPERTENSION / HYPERLIPIDEMIA Satisfied with current treatment? yes Duration of hypertension: chronic BP monitoring frequency: not checking BP range:  BP medication side effects: no Past BP meds: losartan-HCTZ,  Duration of hyperlipidemia: chronic Cholesterol medication side effects: no Cholesterol supplements: none Past cholesterol medications: atorvastatin Medication compliance: excellent compliance Aspirin: no Recent stressors: no Recurrent headaches: no Visual changes: no Palpitations: no Dyspnea: no Chest pain: no Lower extremity edema: no Dizzy/lightheaded: no  CHRONIC PAIN  Present dose: 10 Morphine equivalents Pain control status: stable Duration: chronic Location: wide spread, hip, toes, low back Quality: aching and sore Current Pain Level: moderate Previous Pain Level: moderate Breakthrough pain: yes Benefit from narcotic medications: yes What Activities task can be accomplished with current medication?  able to sit and drive and travel Interested in weaning off narcotics:no   Stool softners/OTC fiber: no  Previous pain specialty evaluation: yes Non-narcotic analgesic meds: no Narcotic contract: yes  Relevant past medical, surgical, family and social history reviewed and updated as indicated. Interim medical history since our last visit reviewed. Allergies and medications reviewed  and updated.  Review of Systems  Constitutional: Negative.   Respiratory: Negative.   Cardiovascular: Negative.   Musculoskeletal: Positive for arthralgias, back pain and myalgias. Negative for gait problem, joint swelling, neck pain and neck stiffness.  Skin: Negative.   Neurological: Negative.   Psychiatric/Behavioral: Negative.     Per HPI unless specifically indicated above     Objective:    BP 130/80   Pulse 79   Temp 98.1 F (36.7 C) (Oral)   Ht 5\' 4"  (1.626 m)   Wt 183 lb (83 kg)   LMP  (LMP Unknown)   SpO2 97%   BMI 31.41 kg/m   Wt Readings from Last 3 Encounters:  06/16/18 183 lb (83 kg)  06/01/18 181 lb 9 oz (82.4 kg)  05/18/18 178 lb 9.6 oz (81 kg)    Physical Exam Vitals signs and nursing note reviewed.  Constitutional:      General: She is not in acute distress.    Appearance: Normal appearance. She is not ill-appearing, toxic-appearing or diaphoretic.  HENT:     Head: Normocephalic and atraumatic.     Right Ear: External ear normal.     Left Ear: External ear normal.     Nose: Nose normal.     Mouth/Throat:     Mouth: Mucous membranes are moist.     Pharynx: Oropharynx is clear.  Eyes:     General: No scleral icterus.       Right eye: No discharge.        Left eye: No discharge.     Extraocular Movements: Extraocular movements intact.  Conjunctiva/sclera: Conjunctivae normal.     Pupils: Pupils are equal, round, and reactive to light.  Neck:     Musculoskeletal: Normal range of motion and neck supple.  Cardiovascular:     Rate and Rhythm: Normal rate and regular rhythm.     Pulses: Normal pulses.     Heart sounds: Normal heart sounds. No murmur. No friction rub. No gallop.   Pulmonary:     Effort: Pulmonary effort is normal. No respiratory distress.     Breath sounds: Normal breath sounds. No stridor. No wheezing, rhonchi or rales.  Chest:     Chest wall: No tenderness.  Musculoskeletal: Normal range of motion.  Skin:    General: Skin  is warm and dry.     Capillary Refill: Capillary refill takes less than 2 seconds.     Coloration: Skin is not jaundiced or pale.     Findings: No bruising, erythema, lesion or rash.  Neurological:     General: No focal deficit present.     Mental Status: She is alert and oriented to person, place, and time. Mental status is at baseline.  Psychiatric:        Mood and Affect: Mood normal.        Behavior: Behavior normal.        Thought Content: Thought content normal.        Judgment: Judgment normal.     Results for orders placed or performed in visit on 06/16/18  Microscopic Examination  Result Value Ref Range   WBC, UA 0-5 0 - 5 /hpf   RBC, UA None seen 0 - 2 /hpf   Epithelial Cells (non renal) 0-10 0 - 10 /hpf   Bacteria, UA Many (A) None seen/Few  Urine Culture, Reflex  Result Value Ref Range   Urine Culture, Routine Preliminary report (A)    Organism ID, Bacteria Escherichia coli (A)    ORGANISM ID, BACTERIA Comment   CBC with Differential/Platelet  Result Value Ref Range   WBC 5.0 3.4 - 10.8 x10E3/uL   RBC 3.78 3.77 - 5.28 x10E6/uL   Hemoglobin 12.0 11.1 - 15.9 g/dL   Hematocrit 36.1 34.0 - 46.6 %   MCV 96 79 - 97 fL   MCH 31.7 26.6 - 33.0 pg   MCHC 33.2 31.5 - 35.7 g/dL   RDW 12.9 11.7 - 15.4 %   Platelets 219 150 - 450 x10E3/uL   Neutrophils 50 Not Estab. %   Lymphs 35 Not Estab. %   Monocytes 12 Not Estab. %   Eos 2 Not Estab. %   Basos 1 Not Estab. %   Neutrophils Absolute 2.5 1.4 - 7.0 x10E3/uL   Lymphocytes Absolute 1.8 0.7 - 3.1 x10E3/uL   Monocytes Absolute 0.6 0.1 - 0.9 x10E3/uL   EOS (ABSOLUTE) 0.1 0.0 - 0.4 x10E3/uL   Basophils Absolute 0.0 0.0 - 0.2 x10E3/uL   Immature Granulocytes 0 Not Estab. %   Immature Grans (Abs) 0.0 0.0 - 0.1 x10E3/uL  Comprehensive metabolic panel  Result Value Ref Range   Glucose 104 (H) 65 - 99 mg/dL   BUN 15 8 - 27 mg/dL   Creatinine, Ser 1.01 (H) 0.57 - 1.00 mg/dL   GFR calc non Af Amer 56 (L) >59 mL/min/1.73    GFR calc Af Amer 64 >59 mL/min/1.73   BUN/Creatinine Ratio 15 12 - 28   Sodium 142 134 - 144 mmol/L   Potassium 4.2 3.5 - 5.2 mmol/L   Chloride 103 96 - 106  mmol/L   CO2 26 20 - 29 mmol/L   Calcium 9.2 8.7 - 10.3 mg/dL   Total Protein 6.6 6.0 - 8.5 g/dL   Albumin 4.1 3.7 - 4.7 g/dL   Globulin, Total 2.5 1.5 - 4.5 g/dL   Albumin/Globulin Ratio 1.6 1.2 - 2.2   Bilirubin Total 0.7 0.0 - 1.2 mg/dL   Alkaline Phosphatase 79 39 - 117 IU/L   AST 24 0 - 40 IU/L   ALT 29 0 - 32 IU/L  Lipid Panel w/o Chol/HDL Ratio  Result Value Ref Range   Cholesterol, Total 165 100 - 199 mg/dL   Triglycerides 133 0 - 149 mg/dL   HDL 50 >39 mg/dL   VLDL Cholesterol Cal 27 5 - 40 mg/dL   LDL Calculated 88 0 - 99 mg/dL  TSH  Result Value Ref Range   TSH 3.680 0.450 - 4.500 uIU/mL  UA/M w/rflx Culture, Routine  Result Value Ref Range   Specific Gravity, UA 1.015 1.005 - 1.030   pH, UA 6.0 5.0 - 7.5   Color, UA Yellow Yellow   Appearance Ur Cloudy (A) Clear   Leukocytes, UA Negative Negative   Protein, UA Negative Negative/Trace   Glucose, UA Negative Negative   Ketones, UA Negative Negative   RBC, UA Negative Negative   Bilirubin, UA Negative Negative   Urobilinogen, Ur 0.2 0.2 - 1.0 mg/dL   Nitrite, UA Positive (A) Negative   Microscopic Examination See below:    Urinalysis Reflex Comment   Iron and TIBC  Result Value Ref Range   Total Iron Binding Capacity 232 (L) 250 - 450 ug/dL   UIBC 104 (L) 118 - 369 ug/dL   Iron 128 27 - 139 ug/dL   Iron Saturation 55 15 - 55 %  B12 and Folate Panel  Result Value Ref Range   Vitamin B-12 357 232 - 1,245 pg/mL   Folate 4.4 >3.0 ng/mL  Ferritin  Result Value Ref Range   Ferritin 272 (H) 15 - 150 ng/mL  VITAMIN D 25 Hydroxy (Vit-D Deficiency, Fractures)  Result Value Ref Range   Vit D, 25-Hydroxy 25.6 (L) 30.0 - 100.0 ng/mL  Microalbumin, Urine Waived  Result Value Ref Range   Microalb, Ur Waived 10 0 - 19 mg/L   Creatinine, Urine Waived 200 10 -  300 mg/dL   Microalb/Creat Ratio <30 <30 mg/g  Bayer DCA Hb A1c Waived  Result Value Ref Range   HB A1C (BAYER DCA - WAIVED) 6.1 <7.0 %      Assessment & Plan:   Problem List Items Addressed This Visit      Cardiovascular and Mediastinum   Essential hypertension    Under good control on current regimen. Continue current regimen. Continue to monitor. Call with any concerns. Refills given. Labs drawn today.        Relevant Medications   losartan-hydrochlorothiazide (HYZAAR) 50-12.5 MG tablet   Other Relevant Orders   CBC with Differential/Platelet (Completed)   UA/M w/rflx Culture, Routine (Completed)   Microalbumin, Urine Waived (Completed)     Endocrine   IFG (impaired fasting glucose)    Rechecking levels today. Await results. Call with any concerns.       Relevant Orders   CBC with Differential/Platelet (Completed)   UA/M w/rflx Culture, Routine (Completed)   Bayer DCA Hb A1c Waived (Completed)     Other   Chronic pain syndrome - Primary (Chronic)    Shauntell did not make an appointment with pain management. She notes  that she is not feeling well and doesn't want to go to [place]. Again discussed that this office does not no long term pain management. She will need to see pain management for them to manage her medication or she will need to wean off her medicine. Refill of her medicine given today for 1 month.  Continue to monitor. Call with any concerns. She will have to follow up monthly for medication and this is only a bridge to get into pain management. We will not prescribe this for more than 6 months. We are currently at 6 months. Given her recent illness, we will give her 1 more month. Appointment scheduled for her with pain management for 06/21/18. We will not increase her dose. We will not give early Rxs. She is aware of this plan and that if she does not keep her appointment with pain management next week, we will begin to wean her off and will not restart her  medicine.       Relevant Orders   CBC with Differential/Platelet (Completed)   UA/M w/rflx Culture, Routine (Completed)   Pure hypercholesterolemia    Under good control on current regimen. Continue current regimen. Continue to monitor. Call with any concerns. Refills given. Labs drawn today.       Relevant Medications   losartan-hydrochlorothiazide (HYZAAR) 50-12.5 MG tablet   Other Relevant Orders   CBC with Differential/Platelet (Completed)   Comprehensive metabolic panel (Completed)   Lipid Panel w/o Chol/HDL Ratio (Completed)   UA/M w/rflx Culture, Routine (Completed)   B12 deficiency    Rechecking levels today. Await results. Call with any concerns.       Relevant Orders   CBC with Differential/Platelet (Completed)   UA/M w/rflx Culture, Routine (Completed)   B12 and Folate Panel (Completed)   Vitamin D deficiency    Rechecking levels today. Await results. Call with any concerns.       Relevant Orders   CBC with Differential/Platelet (Completed)   UA/M w/rflx Culture, Routine (Completed)   VITAMIN D 25 Hydroxy (Vit-D Deficiency, Fractures) (Completed)   Elevated TSH    Rechecking levels today. Await results. Call with any concerns.       Relevant Orders   CBC with Differential/Platelet (Completed)   TSH (Completed)   UA/M w/rflx Culture, Routine (Completed)    Other Visit Diagnoses    Cough       Lungs clear. Will obtain CXR- await results. Call with any concerns.    Relevant Orders   DG Chest 2 View (Completed)   CBC with Differential/Platelet (Completed)   UA/M w/rflx Culture, Routine (Completed)   Chronic fatigue       Likely resolving pneumonia. Will check labs. Await results. Call with any concerns.    Relevant Orders   TSH (Completed)   Anemia, unspecified type       Rechecking levels today. Await results. Call with any concerns.    Relevant Orders   Iron and TIBC (Completed)   Ferritin (Completed)       Follow up plan: Return in about 4 weeks  (around 07/14/2018).

## 2018-06-16 NOTE — Patient Instructions (Addendum)
Tamara Fleming,  As we have discussed this office no longer does chronic pain management. We have been bridging you for the past 6 months, but we will need you to see pain management. We understand that you have been ill with the flu and then pneumonia, so we will give you an additional month of medication this month, however, we will need you to establish with pain management in the next month or we will need to begin to wean your medication. We will follow up with you in 1 month. Your pain management doctor can be reached below:  [facility] [address] [place], [place] [address]  [phone]

## 2018-06-17 ENCOUNTER — Telehealth: Payer: Self-pay | Admitting: Family Medicine

## 2018-06-17 LAB — COMPREHENSIVE METABOLIC PANEL
ALT: 29 IU/L (ref 0–32)
AST: 24 IU/L (ref 0–40)
Albumin/Globulin Ratio: 1.6 (ref 1.2–2.2)
Albumin: 4.1 g/dL (ref 3.7–4.7)
Alkaline Phosphatase: 79 IU/L (ref 39–117)
BUN/Creatinine Ratio: 15 (ref 12–28)
BUN: 15 mg/dL (ref 8–27)
Bilirubin Total: 0.7 mg/dL (ref 0.0–1.2)
CALCIUM: 9.2 mg/dL (ref 8.7–10.3)
CO2: 26 mmol/L (ref 20–29)
CREATININE: 1.01 mg/dL — AB (ref 0.57–1.00)
Chloride: 103 mmol/L (ref 96–106)
GFR calc Af Amer: 64 mL/min/{1.73_m2} (ref >59)
GFR, EST NON AFRICAN AMERICAN: 56 mL/min/{1.73_m2} — AB (ref >59)
GLOBULIN, TOTAL: 2.5 g/dL (ref 1.5–4.5)
Glucose: 104 mg/dL — ABNORMAL HIGH (ref 65–99)
Potassium: 4.2 mmol/L (ref 3.5–5.2)
Sodium: 142 mmol/L (ref 134–144)
Total Protein: 6.6 g/dL (ref 6.0–8.5)

## 2018-06-17 LAB — LIPID PANEL W/O CHOL/HDL RATIO
Cholesterol, Total: 165 mg/dL (ref 100–199)
HDL: 50 mg/dL (ref >39)
LDL Calculated: 88 mg/dL (ref 0–99)
TRIGLYCERIDES: 133 mg/dL (ref 0–149)
VLDL CHOLESTEROL CAL: 27 mg/dL (ref 5–40)

## 2018-06-17 LAB — CBC WITH DIFFERENTIAL/PLATELET
Basophils Absolute: 0 10*3/uL (ref 0.0–0.2)
Basos: 1 %
EOS (ABSOLUTE): 0.1 10*3/uL (ref 0.0–0.4)
EOS: 2 %
HEMATOCRIT: 36.1 % (ref 34.0–46.6)
Hemoglobin: 12 g/dL (ref 11.1–15.9)
IMMATURE GRANULOCYTES: 0 %
Immature Grans (Abs): 0 10*3/uL (ref 0.0–0.1)
Lymphocytes Absolute: 1.8 10*3/uL (ref 0.7–3.1)
Lymphs: 35 %
MCH: 31.7 pg (ref 26.6–33.0)
MCHC: 33.2 g/dL (ref 31.5–35.7)
MCV: 96 fL (ref 79–97)
MONOCYTES: 12 %
MONOS ABS: 0.6 10*3/uL (ref 0.1–0.9)
NEUTROS PCT: 50 %
Neutrophils Absolute: 2.5 10*3/uL (ref 1.4–7.0)
Platelets: 219 10*3/uL (ref 150–450)
RBC: 3.78 x10E6/uL (ref 3.77–5.28)
RDW: 12.9 % (ref 11.7–15.4)
WBC: 5 10*3/uL (ref 3.4–10.8)

## 2018-06-17 LAB — IRON AND TIBC
IRON: 128 ug/dL (ref 27–139)
Iron Saturation: 55 % (ref 15–55)
Total Iron Binding Capacity: 232 ug/dL — ABNORMAL LOW (ref 250–450)
UIBC: 104 ug/dL — AB (ref 118–369)

## 2018-06-17 LAB — TSH: TSH: 3.68 u[IU]/mL (ref 0.450–4.500)

## 2018-06-17 LAB — B12 AND FOLATE PANEL
FOLATE: 4.4 ng/mL (ref >3.0)
VITAMIN B 12: 357 pg/mL (ref 232–1245)

## 2018-06-17 LAB — FERRITIN: FERRITIN: 272 ng/mL — AB (ref 15–150)

## 2018-06-17 LAB — VITAMIN D 25 HYDROXY (VIT D DEFICIENCY, FRACTURES): Vit D, 25-Hydroxy: 25.6 ng/mL — ABNORMAL LOW (ref 30.0–100.0)

## 2018-06-17 MED ORDER — HYDROCODONE-ACETAMINOPHEN 5-325 MG PO TABS: 1.0000 | ORAL_TABLET | Freq: Two times a day (BID) | ORAL | 0 refills | Status: AC | PRN

## 2018-06-17 MED ORDER — FERROUS SULFATE 325 (65 FE) MG PO TABS: 325.0000 mg | ORAL_TABLET | Freq: Every day | ORAL | 3 refills | Status: DC

## 2018-06-17 NOTE — Telephone Encounter (Signed)
Please let her know that her CXR was normal. No sign of any pneumonia. Her labs look good, but her iron is a little low so I've sent her through a iron pill to take daily. Thanks!

## 2018-06-17 NOTE — Telephone Encounter (Signed)
It has already been sent over.

## 2018-06-17 NOTE — Telephone Encounter (Signed)
Patient notified. Patient would like to know when her pain medication will be sent to the pharmacy.

## 2018-06-17 NOTE — Telephone Encounter (Signed)
Patient notified

## 2018-06-19 ENCOUNTER — Encounter: Payer: Self-pay | Admitting: Family Medicine

## 2018-06-19 NOTE — Assessment & Plan Note (Signed)
Rechecking levels today. Await results. Call with any concerns.  

## 2018-06-19 NOTE — Assessment & Plan Note (Signed)
Under good control on current regimen. Continue current regimen. Continue to monitor. Call with any concerns. Refills given. Labs drawn today.   

## 2018-06-19 NOTE — Assessment & Plan Note (Signed)
Tamara Fleming did not make an appointment with pain management. She notes that she is not feeling well and doesn't want to go to [place]. Again discussed that this office does not no long term pain management. She will need to see pain management for them to manage her medication or she will need to wean off her medicine. Refill of her medicine given today for 1 month.  Continue to monitor. Call with any concerns. She will have to follow up monthly for medication and this is only a bridge to get into pain management. We will not prescribe this for more than 6 months. We are currently at 6 months. Given her recent illness, we will give her 1 more month. Appointment scheduled for her with pain management for 06/21/18. We will not increase her dose. We will not give early Rxs. She is aware of this plan and that if she does not keep her appointment with pain management next week, we will begin to wean her off and will not restart her medicine.

## 2018-06-20 ENCOUNTER — Telehealth: Payer: Self-pay | Admitting: Family Medicine

## 2018-06-20 LAB — UA/M W/RFLX CULTURE, ROUTINE
Bilirubin, UA: NEGATIVE
Glucose, UA: NEGATIVE
Ketones, UA: NEGATIVE
Leukocytes, UA: NEGATIVE
Nitrite, UA: POSITIVE — AB
PH UA: 6 (ref 5.0–7.5)
PROTEIN UA: NEGATIVE
RBC, UA: NEGATIVE
Specific Gravity, UA: 1.015 (ref 1.005–1.030)
Urobilinogen, Ur: 0.2 mg/dL (ref 0.2–1.0)

## 2018-06-20 LAB — MICROALBUMIN, URINE WAIVED
Creatinine, Urine Waived: 200 mg/dL (ref 10–300)
Microalb, Ur Waived: 10 mg/L (ref 0–19)
Microalb/Creat Ratio: <30 mg/g (ref <30)

## 2018-06-20 LAB — URINE CULTURE, REFLEX

## 2018-06-20 LAB — MICROSCOPIC EXAMINATION: RBC MICROSCOPIC, UA: NONE SEEN /HPF (ref 0–2)

## 2018-06-20 MED ORDER — NITROFURANTOIN MONOHYD MACRO 100 MG PO CAPS: 100.0000 mg | ORAL_CAPSULE | Freq: Two times a day (BID) | ORAL | 0 refills | Status: DC

## 2018-06-20 NOTE — Telephone Encounter (Signed)
Please let her know that her urine did grow out a bacteria, so I've sent an antibiotic to her pharmacy. Thanks!

## 2018-06-21 DIAGNOSIS — M169 Osteoarthritis of hip, unspecified: Secondary | ICD-10-CM | POA: Diagnosis not present

## 2018-06-21 DIAGNOSIS — Z79891 Long term (current) use of opiate analgesic: Secondary | ICD-10-CM | POA: Diagnosis not present

## 2018-06-21 DIAGNOSIS — M47816 Spondylosis without myelopathy or radiculopathy, lumbar region: Secondary | ICD-10-CM | POA: Diagnosis not present

## 2018-06-21 DIAGNOSIS — Z79899 Other long term (current) drug therapy: Secondary | ICD-10-CM | POA: Diagnosis not present

## 2018-06-21 DIAGNOSIS — G894 Chronic pain syndrome: Secondary | ICD-10-CM | POA: Diagnosis not present

## 2018-06-21 NOTE — Telephone Encounter (Signed)
Patient notified

## 2018-07-14 DIAGNOSIS — G894 Chronic pain syndrome: Secondary | ICD-10-CM | POA: Diagnosis not present

## 2018-07-14 DIAGNOSIS — M545 Low back pain: Secondary | ICD-10-CM | POA: Diagnosis not present

## 2018-07-14 DIAGNOSIS — M47816 Spondylosis without myelopathy or radiculopathy, lumbar region: Secondary | ICD-10-CM | POA: Diagnosis not present

## 2018-07-14 DIAGNOSIS — M169 Osteoarthritis of hip, unspecified: Secondary | ICD-10-CM | POA: Diagnosis not present

## 2018-07-14 DIAGNOSIS — Z79899 Other long term (current) drug therapy: Secondary | ICD-10-CM | POA: Diagnosis not present

## 2018-07-14 DIAGNOSIS — Z79891 Long term (current) use of opiate analgesic: Secondary | ICD-10-CM | POA: Diagnosis not present

## 2018-08-01 ENCOUNTER — Ambulatory Visit
Admission: RE | Admit: 2018-08-01 | Discharge: 2018-08-01 | Disposition: A | Discharge status: Home / Self Care | Payer: Medicare Other | Source: Ambulatory Visit | Attending: Family Medicine | Admitting: Family Medicine

## 2018-08-01 ENCOUNTER — Other Ambulatory Visit: Payer: Self-pay | Admitting: Physician Assistant

## 2018-08-01 ENCOUNTER — Other Ambulatory Visit: Payer: Self-pay

## 2018-08-01 DIAGNOSIS — N6489 Other specified disorders of breast: Secondary | ICD-10-CM

## 2018-09-13 DIAGNOSIS — M25559 Pain in unspecified hip: Secondary | ICD-10-CM | POA: Diagnosis not present

## 2018-09-13 DIAGNOSIS — M169 Osteoarthritis of hip, unspecified: Secondary | ICD-10-CM | POA: Diagnosis not present

## 2018-09-13 DIAGNOSIS — Z79899 Other long term (current) drug therapy: Secondary | ICD-10-CM | POA: Diagnosis not present

## 2018-09-13 DIAGNOSIS — G894 Chronic pain syndrome: Secondary | ICD-10-CM | POA: Diagnosis not present

## 2018-09-13 DIAGNOSIS — M47816 Spondylosis without myelopathy or radiculopathy, lumbar region: Secondary | ICD-10-CM | POA: Diagnosis not present

## 2018-09-13 DIAGNOSIS — Z79891 Long term (current) use of opiate analgesic: Secondary | ICD-10-CM | POA: Diagnosis not present

## 2018-09-18 IMAGING — RF DG HIP (WITH PELVIS) OPERATIVE*L*
1 series · 2 of 2 positions shown · non-contrast
Comparison: 09/30/2016

CLINICAL DATA: Status post left hip arthroplasty.

EXAM:
DG C-ARM 61-120 MIN; OPERATIVE LEFT HIP WITH PELVIS

[Series 1: run · 2 of 2 slices shown]
[im 1/2]
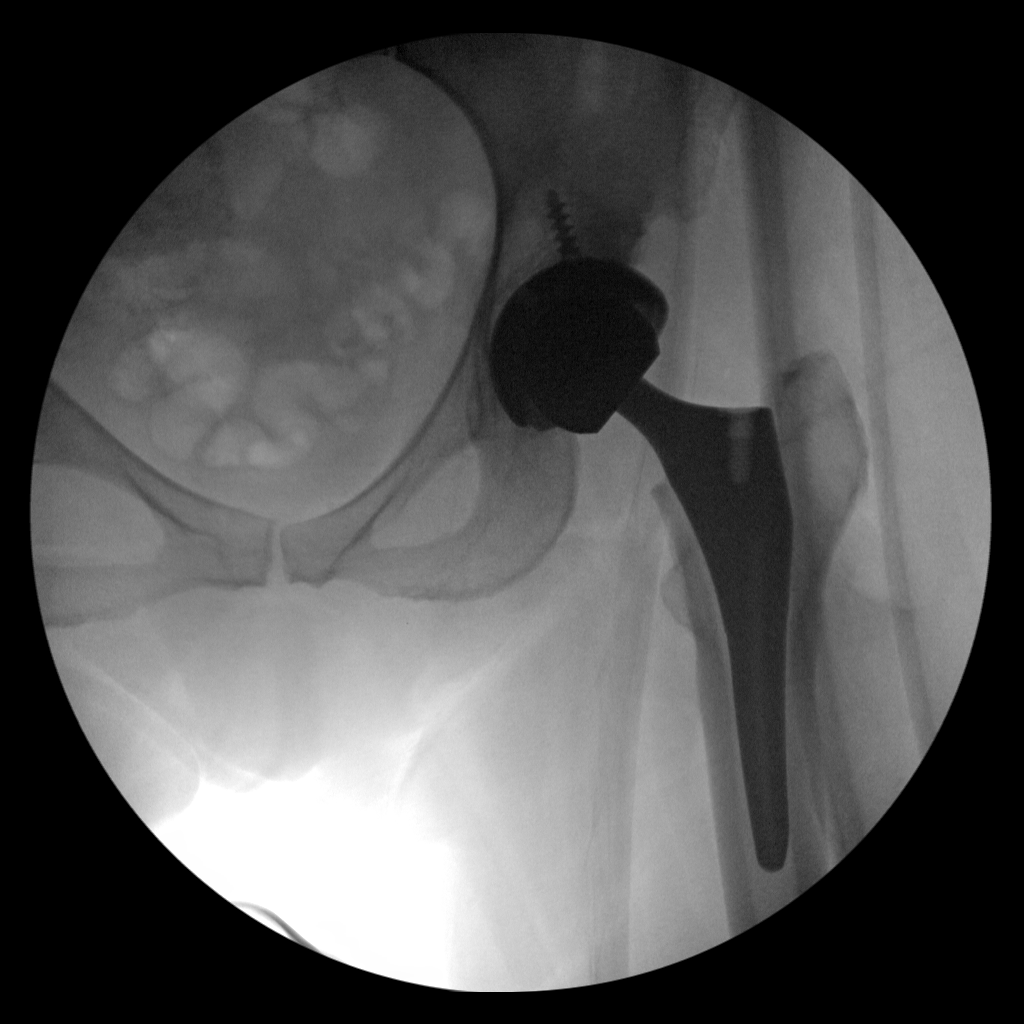
[im 2/2]
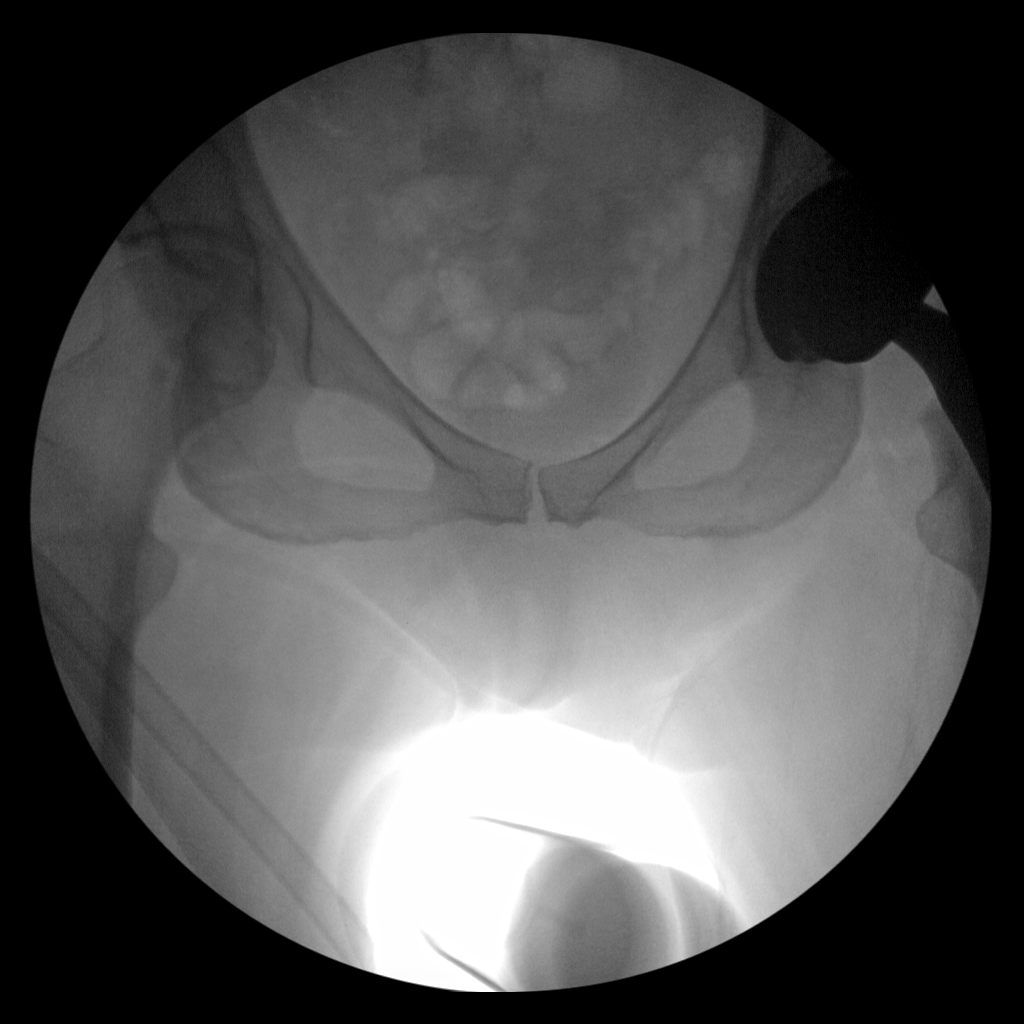

[2 of 2 positions shown; findings below may reference images not displayed]

FINDINGS: Hardware components of a left total hip arthroplasty device are
identified. There is no periprosthetic fracture or subluxation
identified.
IMPRESSION: 1. Status post left hip arthroplasty. No immediate complications
visualized.

## 2018-09-26 ENCOUNTER — Other Ambulatory Visit: Payer: Self-pay | Admitting: Family Medicine

## 2018-11-08 DIAGNOSIS — G894 Chronic pain syndrome: Secondary | ICD-10-CM | POA: Diagnosis not present

## 2018-11-08 DIAGNOSIS — M545 Low back pain: Secondary | ICD-10-CM | POA: Diagnosis not present

## 2018-11-08 DIAGNOSIS — M169 Osteoarthritis of hip, unspecified: Secondary | ICD-10-CM | POA: Diagnosis not present

## 2018-11-08 DIAGNOSIS — Z79891 Long term (current) use of opiate analgesic: Secondary | ICD-10-CM | POA: Diagnosis not present

## 2018-11-08 DIAGNOSIS — Z79899 Other long term (current) drug therapy: Secondary | ICD-10-CM | POA: Diagnosis not present

## 2018-11-08 DIAGNOSIS — M47816 Spondylosis without myelopathy or radiculopathy, lumbar region: Secondary | ICD-10-CM | POA: Diagnosis not present

## 2018-12-19 ENCOUNTER — Other Ambulatory Visit: Payer: Self-pay | Admitting: Family Medicine

## 2018-12-19 NOTE — Telephone Encounter (Signed)
Called pt scheduled appt for tomorrow. She mentioned that she is completely out of her losartan, she took the last pill this morning. Please advise.

## 2018-12-19 NOTE — Telephone Encounter (Signed)
Requested medication (s) are due for refill today: yes  Requested medication (s) are on the active medication list: yes  Last refill:  09/14/2018  Future visit scheduled: no  Notes to clinic: review for refill   Requested Prescriptions  Pending Prescriptions Disp Refills   losartan-hydrochlorothiazide (HYZAAR) 50-12.5 MG tablet [Pharmacy Med Name: LOSARTAN POTASSIUM-HCTZ 50-12.5 MG] 90 tablet 1    Sig: TAKE 1 TABLET BY MOUTH ONCE DAILY     Cardiovascular: ARB + Diuretic Combos Failed - 12/19/2018 10:27 AM      Failed - K in normal range and within 180 days    Potassium  Date Value Ref Range Status  06/16/2018 4.2 3.5 - 5.2 mmol/L Final  09/05/2012 3.9 3.5 - 5.1 mmol/L Final         Failed - Na in normal range and within 180 days    Sodium  Date Value Ref Range Status  06/16/2018 142 134 - 144 mmol/L Final  09/05/2012 139 136 - 145 mmol/L Final         Failed - Cr in normal range and within 180 days    Creatinine  Date Value Ref Range Status  09/05/2012 0.74 0.60 - 1.30 mg/dL Final   Creat  Date Value Ref Range Status  02/26/2012 0.75 0.50 - 1.10 mg/dL Final   Creatinine, Ser  Date Value Ref Range Status  06/16/2018 1.01 (H) 0.57 - 1.00 mg/dL Final         Failed - Ca in normal range and within 180 days    Calcium  Date Value Ref Range Status  06/16/2018 9.2 8.7 - 10.3 mg/dL Final   Calcium, Total  Date Value Ref Range Status  09/05/2012 9.0 8.5 - 10.1 mg/dL Final         Failed - Valid encounter within last 6 months    Recent Outpatient Visits          6 months ago Chronic pain syndrome   [facility] [place], Megan P, DO   6 months ago Right flank pain   [facility] [place], Megan P, DO   7 months ago Pneumonia of left lower lobe due to influenza A virus   [facility], Megan P, DO   7 months ago Pneumonia of left lower lobe due to influenza A virus   [employer], Megan P, DO   7 months  ago Pneumonia of left lower lobe due to influenza A virus   [place], [place], DO             Passed - Patient is not pregnant      Passed - Last BP in normal range    BP Readings from Last 1 Encounters:  06/16/18 130/80

## 2018-12-19 NOTE — Telephone Encounter (Signed)
Needs appointment

## 2018-12-19 NOTE — Telephone Encounter (Signed)
Last seen 06/16/18

## 2018-12-20 ENCOUNTER — Ambulatory Visit (INDEPENDENT_AMBULATORY_CARE_PROVIDER_SITE_OTHER): Payer: Medicare Other | Admitting: Family Medicine

## 2018-12-20 ENCOUNTER — Other Ambulatory Visit: Payer: Self-pay

## 2018-12-20 ENCOUNTER — Encounter: Payer: Self-pay | Admitting: Family Medicine

## 2018-12-20 VITALS — BP 138/82 | HR 67 | Temp 98.6°F | Ht 64.0 in | Wt 181.0 lb

## 2018-12-20 DIAGNOSIS — Z8701 Personal history of pneumonia (recurrent): Secondary | ICD-10-CM

## 2018-12-20 DIAGNOSIS — R7301 Impaired fasting glucose: Secondary | ICD-10-CM

## 2018-12-20 DIAGNOSIS — H538 Other visual disturbances: Secondary | ICD-10-CM

## 2018-12-20 DIAGNOSIS — I10 Essential (primary) hypertension: Secondary | ICD-10-CM | POA: Diagnosis not present

## 2018-12-20 DIAGNOSIS — E7800 Pure hypercholesterolemia, unspecified: Secondary | ICD-10-CM | POA: Diagnosis not present

## 2018-12-20 DIAGNOSIS — F419 Anxiety disorder, unspecified: Secondary | ICD-10-CM | POA: Diagnosis not present

## 2018-12-20 DIAGNOSIS — Z23 Encounter for immunization: Secondary | ICD-10-CM | POA: Diagnosis not present

## 2018-12-20 DIAGNOSIS — E538 Deficiency of other specified B group vitamins: Secondary | ICD-10-CM | POA: Diagnosis not present

## 2018-12-20 DIAGNOSIS — E559 Vitamin D deficiency, unspecified: Secondary | ICD-10-CM

## 2018-12-20 DIAGNOSIS — R7989 Other specified abnormal findings of blood chemistry: Secondary | ICD-10-CM | POA: Diagnosis not present

## 2018-12-20 LAB — BAYER DCA HB A1C WAIVED: HB A1C (BAYER DCA - WAIVED): 5.6 % (ref <7.0)

## 2018-12-20 NOTE — Assessment & Plan Note (Signed)
Under good control with A1c of 5.6. Continue diet and exercise. Call with any concerns.

## 2018-12-20 NOTE — Assessment & Plan Note (Signed)
Under good control off medicine. Continue to monitor. Call with any concerns.  

## 2018-12-20 NOTE — Assessment & Plan Note (Signed)
Under good control on current regimen. Continue current regimen. Continue to monitor. Call with any concerns. Refills given. Labs checked today.  

## 2018-12-20 NOTE — Progress Notes (Signed)
BP 138/82   Pulse 67   Temp 98.6 F (37 C) (Oral)   Ht 5\' 4"  (1.626 m)   Wt 181 lb (82.1 kg)   LMP  (LMP Unknown)   SpO2 97%   BMI 31.07 kg/m    Subjective:    Patient ID: Tamara Fleming, female    DOB: 05-May-1945, 72 y.o.   MRN: [id number]  HPI: Tamara Fleming is a 73 y.o. female  Chief Complaint  Patient presents with  . IFG  . Hyperlipidemia  . Hypertension   Impaired Fasting Glucose HbA1C:  Lab Results  Component Value Date   HGBA1C 6.1 06/16/2018   Duration of elevated blood sugar: chronic Polydipsia: no Polyuria: no Weight change: no Visual disturbance: yes Glucose Monitoring: no Diabetic Education: Completed Family history of diabetes: yes  HYPERTENSION / HYPERLIPIDEMIA Satisfied with current treatment? yes Duration of hypertension: chronic BP monitoring frequency: not checking BP medication side effects: no Past BP meds: losartan-HCTZ Duration of hyperlipidemia: chronic Cholesterol medication side effects: no Cholesterol supplements: none Past cholesterol medications: atorvastatin Medication compliance: excellent compliance Aspirin: no Recent stressors: no Recurrent headaches: no Visual changes: yes Palpitations: no Dyspnea: no Chest pain: no Lower extremity edema: no Dizzy/lightheaded: no  Relevant past medical, surgical, family and social history reviewed and updated as indicated. Interim medical history since our last visit reviewed. Allergies and medications reviewed and updated.  Review of Systems  Constitutional: Negative.   HENT: Negative.   Eyes: Negative.        Eyes trouble focusing when she goes from looking down to looking up  Respiratory: Negative.   Cardiovascular: Negative.   Gastrointestinal: Negative.   Psychiatric/Behavioral: Negative.     Per HPI unless specifically indicated above     Objective:    BP 138/82   Pulse 67   Temp 98.6 F (37 C) (Oral)   Ht 5\' 4"  (1.626 m)   Wt 181 lb (82.1 kg)   LMP   (LMP Unknown)   SpO2 97%   BMI 31.07 kg/m   Wt Readings from Last 3 Encounters:  12/20/18 181 lb (82.1 kg)  06/16/18 183 lb (83 kg)  06/01/18 181 lb 9 oz (82.4 kg)    Physical Exam Vitals signs and nursing note reviewed.  Constitutional:      General: She is not in acute distress.    Appearance: Normal appearance. She is not ill-appearing, toxic-appearing or diaphoretic.  HENT:     Head: Normocephalic and atraumatic.     Right Ear: External ear normal.     Left Ear: External ear normal.     Nose: Nose normal.     Mouth/Throat:     Mouth: Mucous membranes are moist.     Pharynx: Oropharynx is clear.  Eyes:     General: No scleral icterus.       Right eye: No discharge.        Left eye: No discharge.     Extraocular Movements: Extraocular movements intact.     Conjunctiva/sclera: Conjunctivae normal.     Pupils: Pupils are equal, round, and reactive to light.  Neck:     Musculoskeletal: Normal range of motion and neck supple.  Cardiovascular:     Rate and Rhythm: Normal rate and regular rhythm.     Pulses: Normal pulses.     Heart sounds: Normal heart sounds. No murmur. No friction rub. No gallop.   Pulmonary:     Effort: Pulmonary effort is normal. No respiratory distress.  Breath sounds: Normal breath sounds. No stridor. No wheezing, rhonchi or rales.  Chest:     Chest wall: No tenderness.  Musculoskeletal: Normal range of motion.  Skin:    General: Skin is warm and dry.     Capillary Refill: Capillary refill takes less than 2 seconds.     Coloration: Skin is not jaundiced or pale.     Findings: No bruising, erythema, lesion or rash.  Neurological:     General: No focal deficit present.     Mental Status: She is alert and oriented to person, place, and time. Mental status is at baseline.  Psychiatric:        Mood and Affect: Mood normal.        Behavior: Behavior normal.        Thought Content: Thought content normal.        Judgment: Judgment normal.      Results for orders placed or performed in visit on 06/16/18  Microscopic Examination   URINE  Result Value Ref Range   WBC, UA 0-5 0 - 5 /hpf   RBC, UA None seen 0 - 2 /hpf   Epithelial Cells (non renal) 0-10 0 - 10 /hpf   Bacteria, UA Many (A) None seen/Few  Urine Culture, Reflex   URINE  Result Value Ref Range   Urine Culture, Routine Final report (A)    Organism ID, Bacteria Escherichia coli (A)    ORGANISM ID, BACTERIA Enterococcus faecalis (A)    Antimicrobial Susceptibility Comment   CBC with Differential/Platelet  Result Value Ref Range   WBC 5.0 3.4 - 10.8 x10E3/uL   RBC 3.78 3.77 - 5.28 x10E6/uL   Hemoglobin 12.0 11.1 - 15.9 g/dL   Hematocrit 36.1 34.0 - 46.6 %   MCV 96 79 - 97 fL   MCH 31.7 26.6 - 33.0 pg   MCHC 33.2 31.5 - 35.7 g/dL   RDW 12.9 11.7 - 15.4 %   Platelets 219 150 - 450 x10E3/uL   Neutrophils 50 Not Estab. %   Lymphs 35 Not Estab. %   Monocytes 12 Not Estab. %   Eos 2 Not Estab. %   Basos 1 Not Estab. %   Neutrophils Absolute 2.5 1.4 - 7.0 x10E3/uL   Lymphocytes Absolute 1.8 0.7 - 3.1 x10E3/uL   Monocytes Absolute 0.6 0.1 - 0.9 x10E3/uL   EOS (ABSOLUTE) 0.1 0.0 - 0.4 x10E3/uL   Basophils Absolute 0.0 0.0 - 0.2 x10E3/uL   Immature Granulocytes 0 Not Estab. %   Immature Grans (Abs) 0.0 0.0 - 0.1 x10E3/uL  Comprehensive metabolic panel  Result Value Ref Range   Glucose 104 (H) 65 - 99 mg/dL   BUN 15 8 - 27 mg/dL   Creatinine, Ser 1.01 (H) 0.57 - 1.00 mg/dL   GFR calc non Af Amer 56 (L) >59 mL/min/1.73   GFR calc Af Amer 64 >59 mL/min/1.73   BUN/Creatinine Ratio 15 12 - 28   Sodium 142 134 - 144 mmol/L   Potassium 4.2 3.5 - 5.2 mmol/L   Chloride 103 96 - 106 mmol/L   CO2 26 20 - 29 mmol/L   Calcium 9.2 8.7 - 10.3 mg/dL   Total Protein 6.6 6.0 - 8.5 g/dL   Albumin 4.1 3.7 - 4.7 g/dL   Globulin, Total 2.5 1.5 - 4.5 g/dL   Albumin/Globulin Ratio 1.6 1.2 - 2.2   Bilirubin Total 0.7 0.0 - 1.2 mg/dL   Alkaline Phosphatase 79 39 - 117 IU/L   AST  24 0 - 40 IU/L   ALT 29 0 - 32 IU/L  Lipid Panel w/o Chol/HDL Ratio  Result Value Ref Range   Cholesterol, Total 165 100 - 199 mg/dL   Triglycerides 133 0 - 149 mg/dL   HDL 50 >39 mg/dL   VLDL Cholesterol Cal 27 5 - 40 mg/dL   LDL Calculated 88 0 - 99 mg/dL  TSH  Result Value Ref Range   TSH 3.680 0.450 - 4.500 uIU/mL  UA/M w/rflx Culture, Routine   Specimen: Urine   URINE  Result Value Ref Range   Specific Gravity, UA 1.015 1.005 - 1.030   pH, UA 6.0 5.0 - 7.5   Color, UA Yellow Yellow   Appearance Ur Cloudy (A) Clear   Leukocytes, UA Negative Negative   Protein, UA Negative Negative/Trace   Glucose, UA Negative Negative   Ketones, UA Negative Negative   RBC, UA Negative Negative   Bilirubin, UA Negative Negative   Urobilinogen, Ur 0.2 0.2 - 1.0 mg/dL   Nitrite, UA Positive (A) Negative   Microscopic Examination See below:    Urinalysis Reflex Comment   Iron and TIBC  Result Value Ref Range   Total Iron Binding Capacity 232 (L) 250 - 450 ug/dL   UIBC 104 (L) 118 - 369 ug/dL   Iron 128 27 - 139 ug/dL   Iron Saturation 55 15 - 55 %  B12 and Folate Panel  Result Value Ref Range   Vitamin B-12 357 232 - 1,245 pg/mL   Folate 4.4 >3.0 ng/mL  Ferritin  Result Value Ref Range   Ferritin 272 (H) 15 - 150 ng/mL  VITAMIN D 25 Hydroxy (Vit-D Deficiency, Fractures)  Result Value Ref Range   Vit D, 25-Hydroxy 25.6 (L) 30.0 - 100.0 ng/mL  Microalbumin, Urine Waived  Result Value Ref Range   Microalb, Ur Waived 10 0 - 19 mg/L   Creatinine, Urine Waived 200 10 - 300 mg/dL   Microalb/Creat Ratio <30 <30 mg/g  Bayer DCA Hb A1c Waived  Result Value Ref Range   HB A1C (BAYER DCA - WAIVED) 6.1 <7.0 %      Assessment & Plan:   Problem List Items Addressed This Visit      Cardiovascular and Mediastinum   Essential hypertension - Primary    Under good control on current regimen. Continue current regimen. Continue to monitor. Call with any concerns. Refills given. Labs checked  today.       Relevant Orders   Comprehensive metabolic panel   Microalbumin, Urine Waived   UA/M w/rflx Culture, Routine     Endocrine   IFG (impaired fasting glucose)    Under good control with A1c of 5.6. Continue diet and exercise. Call with any concerns.       Relevant Orders   Comprehensive metabolic panel   UA/M w/rflx Culture, Routine   Bayer DCA Hb A1c Waived     Other   Pure hypercholesterolemia    Under good control on current regimen. Continue current regimen. Continue to monitor. Call with any concerns. Refills given. Labs checked today.       Relevant Orders   Comprehensive metabolic panel   Lipid Panel w/o Chol/HDL Ratio   B12 deficiency    Rechecking levels today. Await results. Treat as needed.       Relevant Orders   CBC with Differential/Platelet   B12 and Folate Panel   Anxiety    Under good control off medicine. Continue to monitor. Call with  any concerns.        Vitamin D deficiency    Rechecking levels today. Await results. Treat as needed.       Relevant Orders   VITAMIN D 25 Hydroxy (Vit-D Deficiency, Fractures)    Other Visit Diagnoses    Flu vaccine need       Flu shot given today.   Relevant Orders   Flu Vaccine QUAD High Dose(Fluad) (Completed)   History of pneumonia       Checking COVID antibodies. Await results.   Relevant Orders   SAR CoV2 Serology (COVID 19)AB(IGG)IA   Blurred vision       Needs new eye doctor. Referral generated today.   Relevant Orders   Ambulatory referral to Ophthalmology       Follow up plan: Return in about 6 months (around 06/19/2019) for Wellness/follow up.

## 2018-12-20 NOTE — Assessment & Plan Note (Signed)
Rechecking levels today. Await results. Treat as needed.  

## 2018-12-21 ENCOUNTER — Other Ambulatory Visit: Payer: Medicare Other

## 2018-12-21 DIAGNOSIS — R7301 Impaired fasting glucose: Secondary | ICD-10-CM | POA: Diagnosis not present

## 2018-12-21 DIAGNOSIS — I10 Essential (primary) hypertension: Secondary | ICD-10-CM

## 2018-12-21 LAB — COMPREHENSIVE METABOLIC PANEL
ALT: 42 IU/L — ABNORMAL HIGH (ref 0–32)
AST: 54 IU/L — ABNORMAL HIGH (ref 0–40)
Albumin/Globulin Ratio: 1.9 (ref 1.2–2.2)
Albumin: 4.6 g/dL (ref 3.7–4.7)
Alkaline Phosphatase: 83 IU/L (ref 39–117)
BUN/Creatinine Ratio: 17 (ref 12–28)
BUN: 16 mg/dL (ref 8–27)
Bilirubin Total: 0.7 mg/dL (ref 0.0–1.2)
CO2: 24 mmol/L (ref 20–29)
Calcium: 9.9 mg/dL (ref 8.7–10.3)
Chloride: 101 mmol/L (ref 96–106)
Creatinine, Ser: 0.93 mg/dL (ref 0.57–1.00)
GFR calc Af Amer: 71 mL/min/{1.73_m2} (ref >59)
GFR calc non Af Amer: 62 mL/min/{1.73_m2} (ref >59)
Globulin, Total: 2.4 g/dL (ref 1.5–4.5)
Glucose: 114 mg/dL — ABNORMAL HIGH (ref 65–99)
Potassium: 4.2 mmol/L (ref 3.5–5.2)
Sodium: 141 mmol/L (ref 134–144)
Total Protein: 7 g/dL (ref 6.0–8.5)

## 2018-12-21 LAB — B12 AND FOLATE PANEL
Folate: 6.2 ng/mL (ref >3.0)
Vitamin B-12: 264 pg/mL (ref 232–1245)

## 2018-12-21 LAB — CBC WITH DIFFERENTIAL/PLATELET
Basophils Absolute: 0 10*3/uL (ref 0.0–0.2)
Basos: 1 %
EOS (ABSOLUTE): 0.1 10*3/uL (ref 0.0–0.4)
Eos: 2 %
Hematocrit: 40.4 % (ref 34.0–46.6)
Hemoglobin: 13.6 g/dL (ref 11.1–15.9)
Immature Grans (Abs): 0 10*3/uL (ref 0.0–0.1)
Immature Granulocytes: 0 %
Lymphocytes Absolute: 1.7 10*3/uL (ref 0.7–3.1)
Lymphs: 34 %
MCH: 33.4 pg — ABNORMAL HIGH (ref 26.6–33.0)
MCHC: 33.7 g/dL (ref 31.5–35.7)
MCV: 99 fL — ABNORMAL HIGH (ref 79–97)
Monocytes Absolute: 0.6 10*3/uL (ref 0.1–0.9)
Monocytes: 11 %
Neutrophils Absolute: 2.6 10*3/uL (ref 1.4–7.0)
Neutrophils: 52 %
Platelets: 180 10*3/uL (ref 150–450)
RBC: 4.07 x10E6/uL (ref 3.77–5.28)
RDW: 11.9 % (ref 11.7–15.4)
WBC: 5 10*3/uL (ref 3.4–10.8)

## 2018-12-21 LAB — UA/M W/RFLX CULTURE, ROUTINE
Bilirubin, UA: NEGATIVE
Glucose, UA: NEGATIVE
Ketones, UA: NEGATIVE
Leukocytes,UA: NEGATIVE
Nitrite, UA: NEGATIVE
Protein,UA: NEGATIVE
Specific Gravity, UA: 1.02 (ref 1.005–1.030)
Urobilinogen, Ur: 0.2 mg/dL (ref 0.2–1.0)
pH, UA: 6.5 (ref 5.0–7.5)

## 2018-12-21 LAB — MICROALBUMIN, URINE WAIVED
Creatinine, Urine Waived: 100 mg/dL (ref 10–300)
Microalb, Ur Waived: 10 mg/L (ref 0–19)
Microalb/Creat Ratio: <30 mg/g (ref <30)

## 2018-12-21 LAB — MICROSCOPIC EXAMINATION
Bacteria, UA: NONE SEEN
WBC, UA: NONE SEEN /hpf (ref 0–5)

## 2018-12-21 LAB — VITAMIN D 25 HYDROXY (VIT D DEFICIENCY, FRACTURES): Vit D, 25-Hydroxy: 30 ng/mL (ref 30.0–100.0)

## 2018-12-21 LAB — LIPID PANEL W/O CHOL/HDL RATIO
Cholesterol, Total: 192 mg/dL (ref 100–199)
HDL: 57 mg/dL (ref >39)
LDL Chol Calc (NIH): 112 mg/dL — ABNORMAL HIGH (ref 0–99)
Triglycerides: 128 mg/dL (ref 0–149)
VLDL Cholesterol Cal: 23 mg/dL (ref 5–40)

## 2018-12-21 LAB — SAR COV2 SEROLOGY (COVID19)AB(IGG),IA: SARS-CoV-2 Ab, IgG: NEGATIVE

## 2019-01-04 DIAGNOSIS — H2513 Age-related nuclear cataract, bilateral: Secondary | ICD-10-CM | POA: Diagnosis not present

## 2019-01-11 DIAGNOSIS — Z79899 Other long term (current) drug therapy: Secondary | ICD-10-CM | POA: Diagnosis not present

## 2019-01-11 DIAGNOSIS — M47816 Spondylosis without myelopathy or radiculopathy, lumbar region: Secondary | ICD-10-CM | POA: Diagnosis not present

## 2019-01-11 DIAGNOSIS — M549 Dorsalgia, unspecified: Secondary | ICD-10-CM | POA: Diagnosis not present

## 2019-01-11 DIAGNOSIS — G894 Chronic pain syndrome: Secondary | ICD-10-CM | POA: Diagnosis not present

## 2019-01-11 DIAGNOSIS — M169 Osteoarthritis of hip, unspecified: Secondary | ICD-10-CM | POA: Diagnosis not present

## 2019-01-11 DIAGNOSIS — Z79891 Long term (current) use of opiate analgesic: Secondary | ICD-10-CM | POA: Diagnosis not present

## 2019-02-01 ENCOUNTER — Other Ambulatory Visit: Payer: Self-pay

## 2019-02-01 ENCOUNTER — Ambulatory Visit
Admission: RE | Admit: 2019-02-01 | Discharge: 2019-02-01 | Disposition: A | Discharge status: Home / Self Care | Payer: Medicare Other | Source: Ambulatory Visit | Attending: Family Medicine | Admitting: Family Medicine

## 2019-02-01 DIAGNOSIS — N6489 Other specified disorders of breast: Secondary | ICD-10-CM

## 2019-02-01 DIAGNOSIS — R922 Inconclusive mammogram: Secondary | ICD-10-CM | POA: Diagnosis not present

## 2019-02-20 DIAGNOSIS — R1031 Right lower quadrant pain: Secondary | ICD-10-CM | POA: Diagnosis not present

## 2019-02-20 DIAGNOSIS — Z01411 Encounter for gynecological examination (general) (routine) with abnormal findings: Secondary | ICD-10-CM | POA: Diagnosis not present

## 2019-02-20 DIAGNOSIS — R350 Frequency of micturition: Secondary | ICD-10-CM | POA: Diagnosis not present

## 2019-02-20 DIAGNOSIS — Z6830 Body mass index (BMI) 30.0-30.9, adult: Secondary | ICD-10-CM | POA: Diagnosis not present

## 2019-02-21 ENCOUNTER — Other Ambulatory Visit: Payer: Self-pay | Admitting: Obstetrics & Gynecology

## 2019-02-21 DIAGNOSIS — R1030 Lower abdominal pain, unspecified: Secondary | ICD-10-CM

## 2019-03-08 ENCOUNTER — Other Ambulatory Visit: Payer: Self-pay

## 2019-03-08 ENCOUNTER — Ambulatory Visit
Admission: RE | Admit: 2019-03-08 | Discharge: 2019-03-08 | Disposition: A | Discharge status: Home / Self Care | Payer: Medicare Other | Source: Ambulatory Visit | Attending: Obstetrics & Gynecology | Admitting: Obstetrics & Gynecology

## 2019-03-08 DIAGNOSIS — R1030 Lower abdominal pain, unspecified: Secondary | ICD-10-CM

## 2019-03-08 DIAGNOSIS — K5730 Diverticulosis of large intestine without perforation or abscess without bleeding: Secondary | ICD-10-CM | POA: Diagnosis not present

## 2019-03-08 LAB — POCT I-STAT CREATININE: Creatinine, Ser: 0.9 mg/dL (ref 0.44–1.00)

## 2019-03-08 MED ORDER — IOHEXOL 300 MG/ML  SOLN
100.0000 mL | Freq: Once | INTRAMUSCULAR | Status: AC | PRN
  Administered 2019-03-08: 100 mL via INTRAVENOUS

## 2019-03-13 ENCOUNTER — Other Ambulatory Visit: Payer: Self-pay | Admitting: Family Medicine

## 2019-03-13 DIAGNOSIS — M47816 Spondylosis without myelopathy or radiculopathy, lumbar region: Secondary | ICD-10-CM | POA: Diagnosis not present

## 2019-03-13 DIAGNOSIS — M169 Osteoarthritis of hip, unspecified: Secondary | ICD-10-CM | POA: Diagnosis not present

## 2019-03-13 DIAGNOSIS — M545 Low back pain: Secondary | ICD-10-CM | POA: Diagnosis not present

## 2019-03-13 DIAGNOSIS — G894 Chronic pain syndrome: Secondary | ICD-10-CM | POA: Diagnosis not present

## 2019-04-26 ENCOUNTER — Encounter: Payer: Self-pay | Admitting: Nurse Practitioner

## 2019-04-26 ENCOUNTER — Other Ambulatory Visit: Payer: Self-pay

## 2019-04-26 ENCOUNTER — Ambulatory Visit (INDEPENDENT_AMBULATORY_CARE_PROVIDER_SITE_OTHER): Payer: Medicare Other | Admitting: Nurse Practitioner

## 2019-04-26 VITALS — BP 130/68 | HR 84 | Temp 98.5°F

## 2019-04-26 DIAGNOSIS — M109 Gout, unspecified: Secondary | ICD-10-CM | POA: Insufficient documentation

## 2019-04-26 DIAGNOSIS — M10072 Idiopathic gout, left ankle and foot: Secondary | ICD-10-CM

## 2019-04-26 DIAGNOSIS — I10 Essential (primary) hypertension: Secondary | ICD-10-CM | POA: Diagnosis not present

## 2019-04-26 DIAGNOSIS — R1031 Right lower quadrant pain: Secondary | ICD-10-CM | POA: Diagnosis not present

## 2019-04-26 MED ORDER — PREDNISONE 10 MG PO TABS: ORAL_TABLET | ORAL | 0 refills | Status: DC

## 2019-04-26 MED ORDER — LOSARTAN POTASSIUM 50 MG PO TABS: 50.0000 mg | ORAL_TABLET | Freq: Every day | ORAL | 3 refills | Status: DC

## 2019-04-26 NOTE — Assessment & Plan Note (Signed)
Chronic, ongoing with stable, below goal BP at home.  Initial high in office today, is in pain, but repeat at goal and similar to home readings.  Due to h/o gout will discontinue HCTZ and continue Losartan 50 MG daily, new script sent.  She denies any history of edema.  Recommend continuing to check BP at home daily.  Labs today.  Return to office in 2 weeks.

## 2019-04-26 NOTE — Assessment & Plan Note (Signed)
Ongoing to RLQ and diffuse to right side.  Recent CT imaging with know hiatal hernia and no diverticulitis, she reports GYN exam was normal.  Possibly related to constipation as reports straining with BM.  Will trial Miralax daily, have recommended she start this.  CMP and CBC today.  Return in 2 weeks for follow-up.  If ongoing check TSH and possible referral to GI.

## 2019-04-26 NOTE — Assessment & Plan Note (Signed)
Acute with similar history.  Will discontinue HCTZ and change BP medications to plain Losartan.  Check uric acid, CMP, and CBC today.  Script for Prednisone sent in, will hold off on Indocin due to her current GI discomfort.  If ongoing gout pain, may consider this addition for short period (5 days).  If ongoing gout flares or consistent elevation in uric acid may want to consider addition of Allopurinol in future.  Discussed strict diet adherence and avoidance of alcohol.  Return in 2 weeks for follow-up or sooner if worsening.

## 2019-04-26 NOTE — Progress Notes (Signed)
BP 130/68 (BP Location: Left Arm, Patient Position: Sitting)   Pulse 84   Temp 98.5 F (36.9 C) (Oral)   LMP  (LMP Unknown)   SpO2 98%    Subjective:    Patient ID: Tamara Fleming, female    DOB: 1945/08/24, 74 y.o.   MRN: [id number]  HPI: Tamara Fleming is a 74 y.o. female  Chief Complaint  Patient presents with  . Gout  . Abdominal Pain    Right side, concerned from CT scan in decemeber would like to discuss.    GOUT Last flare two years.  Reports her father had this all the time. In past has taken Indocin and Prednisone, which has resolved flare.  Is on Losartan-HCTZ for BP, reports checking BP at home daily and ranges 120-130/70's. Duration:days Right 1st metatarsophalangeal pain: no Left 1st metatarsophalangeal pain: yes Right knee pain: no Left knee pain: no Severity: 10/10  Quality: sharp, dull, aching and shooting Swelling: yes Redness: yes Trauma: no Recent dietary change or indiscretion: no Fevers: no Nausea/vomiting: no Aggravating factors: Alleviating factors:  Status:  worse Treatments attempted: none  CT SCAN RESULTS: CT scan performed in December for abdominal pain.  Noted no diverticulitis, but some distal colonic diverticulosis + a small to moderate size hiatal hernia and mild fatty liver.  Aortic atherosclerosis noted on CT.  Has history of 3 surgical procedures to abdomen. Continues to have abdominal pain to lower right side, like a twinge.  Notices it more at night, recently has been a little sharper and lasting longer.  Reports yesterday this hurt all day.  Reports she has a history of cyst on right ovary.  Went to OB/GYN when this pain started and had CT scan, they told her cyst was still there "round and clear, so looks fine" + did ovarian cancer blood test and this was negative. Bowel pattern varies, at this time every other day.  Does report straining at times, a little less than 50% of time needs to strain.  Does not take any medications to  help pass bowels.  Has history of appendectomy.   Duration:months Onset: gradual Severity: 5/10 Quality: sharp and aching Location:  RLQ  Episode duration:  Radiation: no Frequency: intermittent Alleviating factors:  Aggravating factors: Status: fluctuating Treatments attempted: none Fever: no Nausea: no Vomiting: no Weight loss: no Decreased appetite: no Diarrhea: no Constipation: yes Blood in stool: no Heartburn: yes, used to take Zantac, now takes Prilosec as needed Jaundice: no Rash: no Dysuria/urinary frequency: no Hematuria: no History of sexually transmitted disease: no Recurrent NSAID use: no  Relevant past medical, surgical, family and social history reviewed and updated as indicated. Interim medical history since our last visit reviewed. Allergies and medications reviewed and updated.  Review of Systems  Constitutional: Negative for activity change, appetite change, diaphoresis, fatigue and fever.  Respiratory: Negative for cough, chest tightness and shortness of breath.   Cardiovascular: Negative for chest pain, palpitations and leg swelling.  Gastrointestinal: Positive for abdominal pain and constipation. Negative for abdominal distention, blood in stool, diarrhea, nausea and vomiting.  Musculoskeletal: Positive for joint swelling.  Neurological: Negative.   Psychiatric/Behavioral: Negative.     Per HPI unless specifically indicated above     Objective:    BP 130/68 (BP Location: Left Arm, Patient Position: Sitting)   Pulse 84   Temp 98.5 F (36.9 C) (Oral)   LMP  (LMP Unknown)   SpO2 98%   Wt Readings from Last 3 Encounters:  12/20/18 181 lb (82.1 kg)  06/16/18 183 lb (83 kg)  06/01/18 181 lb 9 oz (82.4 kg)    Physical Exam Vitals and nursing note reviewed.  Constitutional:      General: She is awake. She is not in acute distress.    Appearance: She is well-developed and overweight. She is not ill-appearing.  HENT:     Head: Normocephalic.       Right Ear: Hearing normal.     Left Ear: Hearing normal.  Eyes:     General: Lids are normal.        Right eye: No discharge.        Left eye: No discharge.     Conjunctiva/sclera: Conjunctivae normal.     Pupils: Pupils are equal, round, and reactive to light.  Neck:     Vascular: No carotid bruit.  Cardiovascular:     Rate and Rhythm: Normal rate and regular rhythm.     Pulses:          Dorsalis pedis pulses are 2+ on the right side and 2+ on the left side.       Posterior tibial pulses are 2+ on the right side and 2+ on the left side.     Heart sounds: Normal heart sounds. No murmur. No gallop.   Pulmonary:     Effort: Pulmonary effort is normal. No accessory muscle usage or respiratory distress.     Breath sounds: Normal breath sounds.  Abdominal:     General: Bowel sounds are normal.     Palpations: Abdomen is soft. There is no hepatomegaly or splenomegaly.     Tenderness: There is no abdominal tenderness. There is no right CVA tenderness, left CVA tenderness or guarding.  Musculoskeletal:     Cervical back: Normal range of motion and neck supple.     Right lower leg: No edema.     Left lower leg: No edema.     Right foot: Normal range of motion.     Left foot: Decreased range of motion.       Feet:  Feet:     Right foot:     Skin integrity: Skin integrity normal.     Toenail Condition: Right toenails are normal.     Left foot:     Skin integrity: Erythema and warmth present.     Toenail Condition: Left toenails are normal.  Skin:    General: Skin is warm and dry.  Neurological:     Mental Status: She is alert and oriented to person, place, and time.  Psychiatric:        Attention and Perception: Attention normal.        Mood and Affect: Mood normal.        Speech: Speech normal.        Behavior: Behavior normal. Behavior is cooperative.        Thought Content: Thought content normal.        Judgment: Judgment normal.     Results for orders placed or  performed during the hospital encounter of 03/08/19  I-STAT creatinine  Result Value Ref Range   Creatinine, Ser 0.90 0.44 - 1.00 mg/dL      Assessment & Plan:   Problem List Items Addressed This Visit      Cardiovascular and Mediastinum   Essential hypertension    Chronic, ongoing with stable, below goal BP at home.  Initial high in office today, is in pain, but repeat at goal and similar  to home readings.  Due to h/o gout will discontinue HCTZ and continue Losartan 50 MG daily, new script sent.  She denies any history of edema.  Recommend continuing to check BP at home daily.  Labs today.  Return to office in 2 weeks.      Relevant Medications   aspirin EC 81 MG tablet   losartan (COZAAR) 50 MG tablet     Other   RLQ abdominal pain    Ongoing to RLQ and diffuse to right side.  Recent CT imaging with know hiatal hernia and no diverticulitis, she reports GYN exam was normal.  Possibly related to constipation as reports straining with BM.  Will trial Miralax daily, have recommended she start this.  CMP and CBC today.  Return in 2 weeks for follow-up.  If ongoing check TSH and possible referral to GI.        Gout - Primary    Acute with similar history.  Will discontinue HCTZ and change BP medications to plain Losartan.  Check uric acid, CMP, and CBC today.  Script for Prednisone sent in, will hold off on Indocin due to her current GI discomfort.  If ongoing gout pain, may consider this addition for short period (5 days).  If ongoing gout flares or consistent elevation in uric acid may want to consider addition of Allopurinol in future.  Discussed strict diet adherence and avoidance of alcohol.  Return in 2 weeks for follow-up or sooner if worsening.      Relevant Orders   Comprehensive metabolic panel   Uric acid   CBC with Differential/Platelet       Follow up plan: Return in about 2 weeks (around 05/10/2019) for Gout and HTN.

## 2019-04-26 NOTE — Patient Instructions (Signed)
 Gout  Gout is painful swelling of your joints. Gout is a type of arthritis. It is caused by having too much uric acid in your body. Uric acid is a chemical that is made when your body breaks down substances called purines. If your body has too much uric acid, sharp crystals can form and build up in your joints. This causes pain and swelling. Gout attacks can happen quickly and be very painful (acute gout). Over time, the attacks can affect more joints and happen more often (chronic gout). What are the causes?  Too much uric acid in your blood. This can happen because: ? Your kidneys do not remove enough uric acid from your blood. ? Your body makes too much uric acid. ? You eat too many foods that are high in purines. These foods include organ meats, some seafood, and beer.  Trauma or stress. What increases the risk?  Having a family history of gout.  Being female and middle-aged.  Being female and having gone through menopause.  Being very overweight (obese).  Drinking alcohol, especially beer.  Not having enough water in the body (being dehydrated).  Losing weight too quickly.  Having an organ transplant.  Having lead poisoning.  Taking certain medicines.  Having kidney disease.  Having a skin condition called psoriasis. What are the signs or symptoms? An attack of acute gout usually happens in just one joint. The most common place is the big toe. Attacks often start at night. Other joints that may be affected include joints of the feet, ankle, knee, fingers, wrist, or elbow. Symptoms of an attack may include:  Very bad pain.  Warmth.  Swelling.  Stiffness.  Shiny, red, or purple skin.  Tenderness. The affected joint may be very painful to touch.  Chills and fever. Chronic gout may cause symptoms more often. More joints may be involved. You may also have white or yellow lumps (tophi) on your hands or feet or in other areas near your joints. How is this  treated?  Treatment for this condition has two phases: treating an acute attack and preventing future attacks.  Acute gout treatment may include: ? NSAIDs. ? Steroids. These are taken by mouth or injected into a joint. ? Colchicine. This medicine relieves pain and swelling. It can be given by mouth or through an IV tube.  Preventive treatment may include: ? Taking small doses of NSAIDs or colchicine daily. ? Using a medicine that reduces uric acid levels in your blood. ? Making changes to your diet. You may need to see a food expert (dietitian) about what to eat and drink to prevent gout. Follow these instructions at home: During a gout attack   If told, put ice on the painful area: ? Put ice in a plastic bag. ? Place a towel between your skin and the bag. ? Leave the ice on for 20 minutes, 2-3 times a day.  Raise (elevate) the painful joint above the level of your heart as often as you can.  Rest the joint as much as possible. If the joint is in your leg, you may be given crutches.  Follow instructions from your doctor about what you cannot eat or drink. Avoiding future gout attacks  Eat a low-purine diet. Avoid foods and drinks such as: ? Liver. ? Kidney. ? Anchovies. ? Asparagus. ? Herring. ? Mushrooms. ? Mussels. ? Beer.  Stay at a healthy weight. If you want to lose weight, talk with your doctor. Do not lose   weight too fast.  Start or continue an exercise plan as told by your doctor. Eating and drinking  Drink enough fluids to keep your pee (urine) pale yellow.  If you drink alcohol: ? Limit how much you use to:  0-1 drink a day for women.  0-2 drinks a day for men. ? Be aware of how much alcohol is in your drink. In the [place] one drink equals one 12 oz bottle of beer (355 mL), one 5 oz glass of wine (148 mL), or one 1 oz glass of hard liquor (44 mL). General instructions  Take over-the-counter and prescription medicines only as told by your doctor.  Do  not drive or use heavy machinery while taking prescription pain medicine.  Return to your normal activities as told by your doctor. Ask your doctor what activities are safe for you.  Keep all follow-up visits as told by your doctor. This is important. Contact a doctor if:  You have another gout attack.  You still have symptoms of a gout attack after 10 days of treatment.  You have problems (side effects) because of your medicines.  You have chills or a fever.  You have burning pain when you pee (urinate).  You have pain in your lower back or belly. Get help right away if:  You have very bad pain.  Your pain cannot be controlled.  You cannot pee. Summary  Gout is painful swelling of the joints.  The most common site of pain is the big toe, but it can affect other joints.  Medicines and avoiding some foods can help to prevent and treat gout attacks. This information is not intended to replace advice given to you by your health care provider. Make sure you discuss any questions you have with your health care provider. Document Revised: 10/13/2017 Document Reviewed: 10/13/2017 Elsevier Patient Education  2020 [employer]  

## 2019-04-27 LAB — CBC WITH DIFFERENTIAL/PLATELET
Basophils Absolute: 0 10*3/uL (ref 0.0–0.2)
Basos: 0 %
EOS (ABSOLUTE): 0.2 10*3/uL (ref 0.0–0.4)
Eos: 2 %
Hematocrit: 38.4 % (ref 34.0–46.6)
Hemoglobin: 13.1 g/dL (ref 11.1–15.9)
Immature Grans (Abs): 0 10*3/uL (ref 0.0–0.1)
Immature Granulocytes: 0 %
Lymphocytes Absolute: 1.6 10*3/uL (ref 0.7–3.1)
Lymphs: 24 %
MCH: 34.2 pg — ABNORMAL HIGH (ref 26.6–33.0)
MCHC: 34.1 g/dL (ref 31.5–35.7)
MCV: 100 fL — ABNORMAL HIGH (ref 79–97)
Monocytes Absolute: 0.9 10*3/uL (ref 0.1–0.9)
Monocytes: 13 %
Neutrophils Absolute: 4.1 10*3/uL (ref 1.4–7.0)
Neutrophils: 61 %
Platelets: 172 10*3/uL (ref 150–450)
RBC: 3.83 x10E6/uL (ref 3.77–5.28)
RDW: 11.8 % (ref 11.7–15.4)
WBC: 6.8 10*3/uL (ref 3.4–10.8)

## 2019-04-27 LAB — COMPREHENSIVE METABOLIC PANEL WITH GFR
ALT: 29 [IU]/L (ref 0–32)
AST: 33 [IU]/L (ref 0–40)
Albumin/Globulin Ratio: 1.8 (ref 1.2–2.2)
Albumin: 4.2 g/dL (ref 3.7–4.7)
Alkaline Phosphatase: 84 [IU]/L (ref 39–117)
BUN/Creatinine Ratio: 16 (ref 12–28)
BUN: 14 mg/dL (ref 8–27)
Bilirubin Total: 0.5 mg/dL (ref 0.0–1.2)
CO2: 25 mmol/L (ref 20–29)
Calcium: 9.2 mg/dL (ref 8.7–10.3)
Chloride: 101 mmol/L (ref 96–106)
Creatinine, Ser: 0.9 mg/dL (ref 0.57–1.00)
GFR calc Af Amer: 74 mL/min/{1.73_m2}
GFR calc non Af Amer: 64 mL/min/{1.73_m2}
Globulin, Total: 2.3 g/dL (ref 1.5–4.5)
Glucose: 124 mg/dL — ABNORMAL HIGH (ref 65–99)
Potassium: 3.8 mmol/L (ref 3.5–5.2)
Sodium: 140 mmol/L (ref 134–144)
Total Protein: 6.5 g/dL (ref 6.0–8.5)

## 2019-04-27 LAB — URIC ACID: Uric Acid: 6.6 mg/dL (ref 3.1–7.9)

## 2019-05-15 DIAGNOSIS — M47816 Spondylosis without myelopathy or radiculopathy, lumbar region: Secondary | ICD-10-CM | POA: Diagnosis not present

## 2019-05-15 DIAGNOSIS — Z79891 Long term (current) use of opiate analgesic: Secondary | ICD-10-CM | POA: Diagnosis not present

## 2019-05-15 DIAGNOSIS — Z79899 Other long term (current) drug therapy: Secondary | ICD-10-CM | POA: Diagnosis not present

## 2019-05-15 DIAGNOSIS — M549 Dorsalgia, unspecified: Secondary | ICD-10-CM | POA: Diagnosis not present

## 2019-05-15 DIAGNOSIS — G894 Chronic pain syndrome: Secondary | ICD-10-CM | POA: Diagnosis not present

## 2019-05-15 DIAGNOSIS — M169 Osteoarthritis of hip, unspecified: Secondary | ICD-10-CM | POA: Diagnosis not present

## 2019-05-19 ENCOUNTER — Ambulatory Visit: Payer: Medicare Other | Attending: Internal Medicine

## 2019-05-19 DIAGNOSIS — Z23 Encounter for immunization: Secondary | ICD-10-CM | POA: Insufficient documentation

## 2019-05-19 NOTE — Progress Notes (Signed)
   Covid-19 Vaccination [facility]  Name:  Tamara Fleming    MRN: [id number] DOB: October 08, 1945  05/19/2019  Tamara Fleming was observed post Covid-19 immunization for 15 minutes without incidence. She was provided with Vaccine Information Sheet and instruction to access the V-Safe system.   Tamara Fleming was instructed to call 911 with any severe reactions post vaccine: Marland Kitchen Difficulty breathing  . Swelling of your face and throat  . A fast heartbeat  . A bad rash all over your body  . Dizziness and weakness    Immunizations Administered    Name Date Dose VIS Date Route   Pfizer COVID-19 Vaccine 05/19/2019 11:57 AM 0.3 mL 03/17/2019 Intramuscular   Manufacturer: [employer]   Lot: [id number]   [place]: [id number]

## 2019-06-01 ENCOUNTER — Ambulatory Visit (INDEPENDENT_AMBULATORY_CARE_PROVIDER_SITE_OTHER): Payer: Medicare Other

## 2019-06-01 VITALS — Ht 64.0 in | Wt 186.0 lb

## 2019-06-01 DIAGNOSIS — Z0000 Encounter for general adult medical examination without abnormal findings: Secondary | ICD-10-CM

## 2019-06-01 NOTE — Progress Notes (Signed)
Subjective:   Tamara Fleming is a 74 y.o. female who presents for Medicare Annual (Subsequent) preventive examination.  This visit is being conducted via phone call  - after an attmept to do on video chat - due to the COVID-19 pandemic. This patient has given me verbal consent via phone to conduct this visit, patient states they are participating from their home address. Some vital signs may be absent or patient reported.   Patient identification: identified by name, DOB, and current address.    Review of Systems:  Cardiac Risk Factors include: advanced age (>27men, >23 women);dyslipidemia;hypertension     Objective:     Vitals: Ht 5\' 4"  (1.626 m)   Wt 186 lb (84.4 kg)   LMP  (LMP Unknown)   BMI 31.93 kg/m   Body mass index is 31.93 kg/m.  Advanced Directives 06/01/2019 01/08/2018 11/17/2017 06/07/2017 04/15/2017 04/02/2017 09/30/2016  Does Patient Have a Medical Advance Directive? No No No No No No No  Would patient like information on creating a medical advance directive? - No - Patient declined Yes (MAU/Ambulatory/Procedural Areas - Information given) No - Patient declined No - Patient declined No - Patient declined No - Patient declined    Tobacco Social History   Tobacco Use  Smoking Status Never Smoker  Smokeless Tobacco Never Used     Counseling given: Not Answered   Clinical Intake:  Pre-visit preparation completed: Yes  Pain : 0-10 Pain Score: 4  Pain Type: Chronic pain Pain Location: Back Pain Orientation: Left, Right, Lower, Upper, Mid Pain Radiating Towards: right and left hip Pain Descriptors / Indicators: Aching Pain Onset: More than a month ago Pain Frequency: Constant     Nutritional Status: BMI > 30  Obese Nutritional Risks: None Diabetes: No  How often do you need to have someone help you when you read instructions, pamphlets, or other written materials from your doctor or pharmacy?: 1 - Never  Interpreter Needed?: No  Information entered  by ::  ,LPN  Past Medical History:  Diagnosis Date  . Arthritis of left hip 02/17/2017  . Carpal tunnel syndrome 05/09/2014   Right  . Chest pain   . Chest pain with high risk for cardiac etiology 10/20/2010  . Chronic low back pain   . Complication of anesthesia    small mouth  . Diverticulitis 02/17/2017  . Fatigue   . GERD (gastroesophageal reflux disease)    occ  . Gout   . Heart murmur   . Hiatal hernia   . Hip pain    Left  . Hyperlipidemia   . Hypertension   . Low back pain (secondary) bilateral (right greater than left) 09/30/2016  . OA (osteoarthritis)   . Peripheral vascular disease ([place])    plhebitis 20 yrs ago  . Piriformis syndrome of right side 02/17/2017  . Primary osteoarthritis of first carpometacarpal joint of left hand 09/10/2016  . Primary osteoarthritis of left hip 03/23/2017   Past Surgical History:  Procedure Laterality Date  . ABDOMINAL HYSTERECTOMY  1982   Partial  . APPENDECTOMY    . ARM SURGERY Left    OF THE ULNA NERVE  . BREAST EXCISIONAL BIOPSY Right   . CARDIOVASCULAR STRESS TEST  08/11/2006   NORMAL. EF 55-60%  . CARDIOVASCULAR STRESS TEST  10/31/1998   EF 60% AT REST TO 75-80% POST EXERCISE  . CARPAL TUNNEL RELEASE Right 2015  . ESOPHAGOGASTRODUODENOSCOPY  05/16/1990  . ESOPHAGOGASTRODUODENOSCOPY (EGD) WITH PROPOFOL N/A 02/04/2015   Procedure:  ESOPHAGOGASTRODUODENOSCOPY (EGD) WITH PROPOFOL;  Surgeon: Hulen Luster, MD;  [facility] END[facility]COPY;  Service: Gastroenterology;  Laterality: N/A;  . JOINT REPLACEMENT    . REDUCTION MAMMAPLASTY  06/11/15  . TOTAL HIP ARTHROPLASTY Left 04/13/2017   Procedure: TOTAL LEFT HIP ARTHROPLASTY ANTERIOR APPROACH;  Surgeon: Renette Butters, MD;  Location: [facility];  Service: Orthopedics;  Laterality: Left;   Family History  Problem Relation Age of Onset  . Coronary artery disease Mother   . Cancer Father        Lung   Social History   Socioeconomic History  . Marital status: Married    Spouse  name: Not on file  . Number of children: Not on file  . Years of education: 72, 1 year college   . Highest education level: Some college, no degree  Occupational History  . Occupation: self employed   Tobacco Use  . Smoking status: Never Smoker  . Smokeless tobacco: Never Used  Substance and Sexual Activity  . Alcohol use: Yes    Alcohol/week: 14.0 standard drinks    Types: 14 Glasses of wine per week    Comment: wine, 1-2 daily  . Drug use: No  . Sexual activity: Not Currently    Birth control/protection: None  Other Topics Concern  . Not on file  Social History Narrative   Self employed   Social Determinants of Health   Financial Resource Strain:   . Difficulty of Paying Living Expenses: Not on file  Food Insecurity:   . Worried About Charity fundraiser in the Last Year: Not on file  . Ran Out of Food in the Last Year: Not on file  Transportation Needs:   . Lack of Transportation (Medical): Not on file  . Lack of Transportation (Non-Medical): Not on file  Physical Activity:   . Days of Exercise per Week: Not on file  . Minutes of Exercise per Session: Not on file  Stress:   . Feeling of Stress : Not on file  Social Connections:   . Frequency of Communication with Friends and Family: Not on file  . Frequency of Social Gatherings with Friends and Family: Not on file  . Attends Religious Services: Not on file  . Active Member of Clubs or Organizations: Not on file  . Attends Archivist Meetings: Not on file  . Marital Status: Not on file    Outpatient Encounter Medications as of 06/01/2019  Medication Sig  . aspirin EC 81 MG tablet Take 81 mg by mouth daily.  Marland Kitchen atorvastatin (LIPITOR) 10 MG tablet TAKE 1 TABLET BY MOUTH AT BEDTIME  . HYDROcodone-acetaminophen (NORCO/VICODIN) 5-325 MG tablet Take 1 tablet by mouth 3 (three) times daily as needed for moderate pain.  Marland Kitchen losartan (COZAAR) 50 MG tablet Take 1 tablet (50 mg total) by mouth daily.  . [DISCONTINUED]  predniSONE (DELTASONE) 10 MG tablet Take 6 tablets by mouth daily for 2 days, then reduce by 1 tablet every 2 days until gone (Patient not taking: Reported on 06/01/2019)   No facility-administered encounter medications on file as of 06/01/2019.    Activities of Daily Living In your present state of health, do you have any difficulty performing the following activities: 06/01/2019  Hearing? N  Comment no hearing aids  Vision? N  Comment no glasses, [facility]  Difficulty concentrating or making decisions? N  Walking or climbing stairs? N  Dressing or bathing? N  Doing errands, shopping? N  Conservation officer, nature and  eating ? N  Using the Toilet? N  In the past six months, have you accidently leaked urine? N  Do you have problems with loss of bowel control? N  Managing your Medications? N  Managing your Finances? N  Housekeeping or managing your Housekeeping? N  Some recent data might be hidden    Patient Care Team: Guadalupe Maple, MD as PCP - General (Family Medicine) Almedia Balls, MD as Consulting Physician (Orthopedic Surgery) Isaias Cowman, MD as Consulting Physician (Cardiology)    Assessment:   This is a routine wellness examination for Tamara Fleming.  Exercise Activities and Dietary recommendations Current Exercise Habits: The patient does not participate in regular exercise at present, Exercise limited by: None identified  Goals Addressed   None     Fall Risk: Fall Risk  06/01/2019 12/20/2017 11/17/2017 11/03/2017 09/30/2016  Falls in the past year? 0 No Yes Yes Yes  Number falls in past yr: 0 - 2 or more 2 or more 2 or more  Comment - - - this was prior to having hip surgery which is what prompted this.  knee gave out and fell coming up steps  Injury with Fall? 0 - Yes No No  Risk Factor Category  - - High Fall Risk - -  Risk for fall due to : - - - - (No Data)  Risk for fall due to: Comment - - - - knees gives out  Follow up - - Falls prevention discussed;Falls  evaluation completed - Education provided    FALL RISK PREVENTION PERTAINING TO THE HOME:  Any stairs in or around the home? Yes  If so, are there any without handrails? No   Home free of loose throw rugs in walkways, pet beds, electrical cords, etc? Yes  Adequate lighting in your home to reduce risk of falls? Yes   ASSISTIVE DEVICES UTILIZED TO PREVENT FALLS:  Life alert? No  Use of a cane, walker or w/c? No  Grab bars in the bathroom? No  Shower chair or bench in shower? No  Elevated toilet seat or a handicapped toilet? No   DME ORDERS:  DME order needed?  No   TIMED UP AND GO:  Unable to perform    Depression Screen PHQ 2/9 Scores 06/01/2019 12/20/2017 11/17/2017 07/13/2017  PHQ - 2 Score 0 0 0 0  PHQ- 9 Score - 4 2 1      Cognitive Function     6CIT Screen 11/17/2017  What Year? 0 points  What month? 0 points  What time? 0 points  Count back from 20 0 points  Months in reverse 0 points  Repeat phrase 0 points  Total Score 0    Immunization History  Administered Date(s) Administered  . Fluad Quad(high Dose 65+) 12/20/2018  . Influenza, High Dose Seasonal PF 01/21/2016, 01/20/2017, 03/15/2018  . Influenza-Unspecified 02/27/2015  . PFIZER SARS-COV-2 Vaccination 05/19/2019  . Pneumococcal Conjugate-13 11/17/2013  . Pneumococcal Polysaccharide-23 05/16/2014    Qualifies for Shingles Vaccine? Yes  Zostavax completed n/a. Due for Shingrix. Education has been provided regarding the importance of this vaccine. Pt has been advised to call insurance company to determine out of pocket expense. Advised may also receive vaccine at local pharmacy or Health Dept. Verbalized acceptance and understanding.  Tdap: Discussed need for TD/TDAP vaccine, patient verbalized understanding that this is not covered as a preventative with there insurance and to call the office if she develops any new skin injuries, ie: cuts, scrapes, bug bites, or open  wounds.  Flu Vaccine: up to date    Pneumococcal Vaccine: up to date   Covid-19 Vaccine: Completed first dose 05/19/19 second dose scheduled march 9th.   Screening Tests Health Maintenance  Topic Date Due  . TETANUS/TDAP  05/29/1965  . MAMMOGRAM  08/02/2019  . COLONOSCOPY  04/15/2023  . INFLUENZA VACCINE  Completed  . PNA vac Low Risk Adult  Completed  . DEXA SCAN  Addressed  . Hepatitis C Screening  Addressed    Cancer Screenings:  Colorectal Screening: Completed 2015. Repeat every 10 years  Mammogram: Completed 02/01/2019. Repeat every year  Bone Density: Completed 2011.  Lung Cancer Screening: (Low Dose CT Chest recommended if Age 12-80 years, 30 pack-year currently smoking OR have quit w/in 15years.) does not qualify.    Additional Screening:  Hepatitis C Screening: does qualify; Completed 2016  Vision Screening: Recommended annual ophthalmology exams for early detection of glaucoma and other disorders of the eye. Is the patient up to date with their annual eye exam?  Yes  Who is the provider or what is the name of the office in which the pt attends annual eye exams? [facility]    Dental Screening: Recommended annual dental exams for proper oral hygiene  Community Resource Referral:  CRR required this visit?  No       Plan:  I have personally reviewed and addressed the Medicare Annual Wellness questionnaire and have noted the following in the patient's chart:  A. Medical and social history B. Use of alcohol, tobacco or illicit drugs  C. Current medications and supplements D. Functional ability and status E.  Nutritional status F.  Physical activity G. Advance directives H. List of other physicians I.  Hospitalizations, surgeries, and ER visits in previous 12 months J.  [place] such as hearing and vision if needed, cognitive and depression L. Referrals and appointments   In addition, I have reviewed and discussed with patient certain preventive protocols, quality  metrics, and best practice recommendations. A written personalized care plan for preventive services as well as general preventive health recommendations were provided to patient.  Signed,    Bevelyn Ngo, LPN  [phone] Nurse Health Advisor   Nurse Notes: none

## 2019-06-01 NOTE — Patient Instructions (Signed)
Tamara Fleming , Thank you for taking time to come for your Medicare Wellness Visit. I appreciate your ongoing commitment to your health goals. Please review the following plan we discussed and let me know if I can assist you in the future.   Screening recommendations/referrals: Colonoscopy: completed 2015 Mammogram: up to date  Bone Density: up to date Recommended yearly ophthalmology/optometry visit for glaucoma screening and checkup Recommended yearly dental visit for hygiene and checkup  Vaccinations: Influenza vaccine: up to date Pneumococcal vaccine: up to date  Tdap vaccine: due now  Shingles vaccine: shingrix eligible   Covid-19: first dose completed, second dose scheduled 06/13/2019  Advanced directives: Advance directive discussed with you today.Once this is complete please bring a copy in to our office so we can scan it into your chart.  Conditions/risks identified: none   Next appointment: Follow up in one year for your annual wellness visit    Preventive Care 65 Years and Older, Female Preventive care refers to lifestyle choices and visits with your health care provider that can promote health and wellness. What does preventive care include?  A yearly physical exam. This is also called an annual well check.  Dental exams once or twice a year.  Routine eye exams. Ask your health care provider how often you should have your eyes checked.  Personal lifestyle choices, including:  Daily care of your teeth and gums.  Regular physical activity.  Eating a healthy diet.  Avoiding tobacco and drug use.  Limiting alcohol use.  Practicing safe sex.  Taking low-dose aspirin every day.  Taking vitamin and mineral supplements as recommended by your health care provider. What happens during an annual well check? The services and screenings done by your health care provider during your annual well check will depend on your age, overall health, lifestyle risk factors, and  family history of disease. Counseling  Your health care provider may ask you questions about your:  Alcohol use.  Tobacco use.  Drug use.  Emotional well-being.  Home and relationship well-being.  Sexual activity.  Eating habits.  History of falls.  Memory and ability to understand (cognition).  Work and work Statistician.  Reproductive health. Screening  You may have the following tests or measurements:  Height, weight, and BMI.  Blood pressure.  Lipid and cholesterol levels. These may be checked every 5 years, or more frequently if you are over 21 years old.  Skin check.  Lung cancer screening. You may have this screening every year starting at age 48 if you have a 30-pack-year history of smoking and currently smoke or have quit within the past 15 years.  Fecal occult blood test (FOBT) of the stool. You may have this test every year starting at age 21.  Flexible sigmoidoscopy or colonoscopy. You may have a sigmoidoscopy every 5 years or a colonoscopy every 10 years starting at age 55.  Hepatitis C blood test.  Hepatitis B blood test.  Sexually transmitted disease (STD) testing.  Diabetes screening. This is done by checking your blood sugar (glucose) after you have not eaten for a while (fasting). You may have this done every 1-3 years.  Bone density scan. This is done to screen for osteoporosis. You may have this done starting at age 47.  Mammogram. This may be done every 1-2 years. Talk to your health care provider about how often you should have regular mammograms. Talk with your health care provider about your test results, treatment options, and if necessary, the need for  more tests. Vaccines  Your health care provider may recommend certain vaccines, such as:  Influenza vaccine. This is recommended every year.  Tetanus, diphtheria, and acellular pertussis (Tdap, Td) vaccine. You may need a Td booster every 10 years.  Zoster vaccine. You may need this  after age 14.  Pneumococcal 13-valent conjugate (PCV13) vaccine. One dose is recommended after age 53.  Pneumococcal polysaccharide (PPSV23) vaccine. One dose is recommended after age 74. Talk to your health care provider about which screenings and vaccines you need and how often you need them. This information is not intended to replace advice given to you by your health care provider. Make sure you discuss any questions you have with your health care provider. Document Released: 04/19/2015 Document Revised: 12/11/2015 Document Reviewed: 01/22/2015 Elsevier Interactive Patient Education  2017 [place] Prevention in the Home Falls can cause injuries. They can happen to people of all ages. There are many things you can do to make your home safe and to help prevent falls. What can I do on the outside of my home?  Regularly fix the edges of walkways and driveways and fix any cracks.  Remove anything that might make you trip as you walk through a door, such as a raised step or threshold.  Trim any bushes or trees on the path to your home.  Use bright outdoor lighting.  Clear any walking paths of anything that might make someone trip, such as rocks or tools.  Regularly check to see if handrails are loose or broken. Make sure that both sides of any steps have handrails.  Any raised decks and porches should have guardrails on the edges.  Have any leaves, snow, or ice cleared regularly.  Use sand or salt on walking paths during winter.  Clean up any spills in your garage right away. This includes oil or grease spills. What can I do in the bathroom?  Use night lights.  Install grab bars by the toilet and in the tub and shower. Do not use towel bars as grab bars.  Use non-skid mats or decals in the tub or shower.  If you need to sit down in the shower, use a plastic, non-slip stool.  Keep the floor dry. Clean up any water that spills on the floor as soon as it  happens.  Remove soap buildup in the tub or shower regularly.  Attach bath mats securely with double-sided non-slip rug tape.  Do not have throw rugs and other things on the floor that can make you trip. What can I do in the bedroom?  Use night lights.  Make sure that you have a light by your bed that is easy to reach.  Do not use any sheets or blankets that are too big for your bed. They should not hang down onto the floor.  Have a firm chair that has side arms. You can use this for support while you get dressed.  Do not have throw rugs and other things on the floor that can make you trip. What can I do in the kitchen?  Clean up any spills right away.  Avoid walking on wet floors.  Keep items that you use a lot in easy-to-reach places.  If you need to reach something above you, use a strong step stool that has a grab bar.  Keep electrical cords out of the way.  Do not use floor polish or wax that makes floors slippery. If you must use wax, use non-skid  floor wax.  Do not have throw rugs and other things on the floor that can make you trip. What can I do with my stairs?  Do not leave any items on the stairs.  Make sure that there are handrails on both sides of the stairs and use them. Fix handrails that are broken or loose. Make sure that handrails are as long as the stairways.  Check any carpeting to make sure that it is firmly attached to the stairs. Fix any carpet that is loose or worn.  Avoid having throw rugs at the top or bottom of the stairs. If you do have throw rugs, attach them to the floor with carpet tape.  Make sure that you have a light switch at the top of the stairs and the bottom of the stairs. If you do not have them, ask someone to add them for you. What else can I do to help prevent falls?  Wear shoes that:  Do not have high heels.  Have rubber bottoms.  Are comfortable and fit you well.  Are closed at the toe. Do not wear sandals.  If you  use a stepladder:  Make sure that it is fully opened. Do not climb a closed stepladder.  Make sure that both sides of the stepladder are locked into place.  Ask someone to hold it for you, if possible.  Clearly mark and make sure that you can see:  Any grab bars or handrails.  First and last steps.  Where the edge of each step is.  Use tools that help you move around (mobility aids) if they are needed. These include:  Canes.  Walkers.  Scooters.  Crutches.  Turn on the lights when you go into a dark area. Replace any light bulbs as soon as they burn out.  Set up your furniture so you have a clear path. Avoid moving your furniture around.  If any of your floors are uneven, fix them.  If there are any pets around you, be aware of where they are.  Review your medicines with your doctor. Some medicines can make you feel dizzy. This can increase your chance of falling. Ask your doctor what other things that you can do to help prevent falls. This information is not intended to replace advice given to you by your health care provider. Make sure you discuss any questions you have with your health care provider. Document Released: 01/17/2009 Document Revised: 08/29/2015 Document Reviewed: 04/27/2014 Elsevier Interactive Patient Education  2017 [employer].

## 2019-06-13 ENCOUNTER — Ambulatory Visit: Payer: Medicare Other | Attending: Internal Medicine

## 2019-06-13 DIAGNOSIS — Z23 Encounter for immunization: Secondary | ICD-10-CM | POA: Insufficient documentation

## 2019-06-13 NOTE — Progress Notes (Signed)
   Covid-19 Vaccination Clinic  Name:  Tamara Fleming    MRN: [id number] DOB: [birth year]  06/13/2019  [name] was observed post Covid-19 immunization for 15 minutes without incident. She was provided with Vaccine Information Sheet and instruction to access the V-Safe system.   [name] was instructed to call 911 with any severe reactions post vaccine: Marland Kitchen Difficulty breathing  . Swelling of face and throat  . A fast heartbeat  . A bad rash all over body  . Dizziness and weakness   Immunizations Administered    Name Date Dose VIS Date Route   Pfizer COVID-19 Vaccine 06/13/2019 12:52 PM 0.3 mL 03/17/2019 Intramuscular   Manufacturer: [place]   Lot: [id number]   [place]: [id number]

## 2019-06-20 ENCOUNTER — Ambulatory Visit: Payer: Medicare Other | Admitting: Family Medicine

## 2019-06-21 ENCOUNTER — Telehealth (INDEPENDENT_AMBULATORY_CARE_PROVIDER_SITE_OTHER): Payer: Medicare Other | Admitting: Family Medicine

## 2019-06-21 ENCOUNTER — Encounter: Payer: Self-pay | Admitting: Family Medicine

## 2019-06-21 DIAGNOSIS — E039 Hypothyroidism, unspecified: Secondary | ICD-10-CM

## 2019-06-21 DIAGNOSIS — M1A9XX0 Chronic gout, unspecified, without tophus (tophi): Secondary | ICD-10-CM

## 2019-06-21 DIAGNOSIS — R7301 Impaired fasting glucose: Secondary | ICD-10-CM

## 2019-06-21 DIAGNOSIS — E7800 Pure hypercholesterolemia, unspecified: Secondary | ICD-10-CM | POA: Diagnosis not present

## 2019-06-21 DIAGNOSIS — E559 Vitamin D deficiency, unspecified: Secondary | ICD-10-CM | POA: Diagnosis not present

## 2019-06-21 DIAGNOSIS — R7989 Other specified abnormal findings of blood chemistry: Secondary | ICD-10-CM

## 2019-06-21 DIAGNOSIS — I10 Essential (primary) hypertension: Secondary | ICD-10-CM

## 2019-06-21 DIAGNOSIS — E538 Deficiency of other specified B group vitamins: Secondary | ICD-10-CM | POA: Diagnosis not present

## 2019-06-21 MED ORDER — ATORVASTATIN CALCIUM 10 MG PO TABS: 10.0000 mg | ORAL_TABLET | Freq: Every day | ORAL | 1 refills | Status: DC

## 2019-06-21 MED ORDER — LOSARTAN POTASSIUM 50 MG PO TABS: 50.0000 mg | ORAL_TABLET | Freq: Every day | ORAL | 1 refills | Status: DC

## 2019-06-21 NOTE — Progress Notes (Signed)
LMP  (LMP Unknown)    Subjective:    Patient ID: Tamara Fleming, female    DOB: [birth year], 74 y.o.   MRN: [id number]  HPI: Tamara Fleming is a 74 y.o. female  Chief Complaint  Patient presents with  . Hypertension    Patient is out of town, she is not able to check her BP right now   HYPERTENSION / [place] Satisfied with current treatment? yes Duration of hypertension: chronic BP monitoring frequency: not checking BP medication side effects: no Past BP meds: losartan Duration of hyperlipidemia: chronic Cholesterol medication side effects: no Cholesterol supplements: none Past cholesterol medications: atorvastatin Medication compliance: excellent compliance Aspirin: yes Recent stressors: no Recurrent headaches: no Visual changes: no Palpitations: no Dyspnea: no Chest pain: no Lower extremity edema: no Dizzy/lightheaded: no  Impaired Fasting Glucose HbA1C:  Lab Results  Component Value Date   HGBA1C 5.6 12/20/2018   Duration of elevated blood sugar: chronic Polydipsia: no Polyuria: no Weight change: no Visual disturbance: no Glucose Monitoring: no Diabetic Education: Not Completed Family history of diabetes: yes  Gout- No flares. Feeling well. Not on medicine.   Relevant past medical, surgical, family and social history reviewed and updated as indicated. Interim medical history since our last visit reviewed. Allergies and medications reviewed and updated.  Review of Systems  Constitutional: Negative.   Respiratory: Negative.   Cardiovascular: Negative.   Gastrointestinal: Negative.   Musculoskeletal: Negative.   Neurological: Negative.   Psychiatric/Behavioral: Negative.     Per HPI unless specifically indicated above     Objective:    LMP  (LMP Unknown)   Wt Readings from Last 3 Encounters:  06/01/19 186 lb (84.4 kg)  12/20/18 181 lb (82.1 kg)  06/16/18 183 lb (83 kg)    Physical Exam Vitals and nursing note reviewed.    Constitutional:      General: She is not in acute distress.    Appearance: Normal appearance. She is not ill-appearing, toxic-appearing or diaphoretic.  HENT:     Head: Normocephalic and atraumatic.     Right Ear: External ear normal.     Left Ear: External ear normal.     Nose: Nose normal.     Mouth/Throat:     Mouth: Mucous membranes are moist.     Pharynx: Oropharynx is clear.  Eyes:     General: No scleral icterus.       Right eye: No discharge.        Left eye: No discharge.     Conjunctiva/sclera: Conjunctivae normal.     Pupils: Pupils are equal, round, and reactive to light.  Pulmonary:     Effort: Pulmonary effort is normal. No respiratory distress.     Comments: Speaking in full sentences Musculoskeletal:        General: Normal range of motion.     Cervical back: Normal range of motion.  Skin:    Coloration: Skin is not jaundiced or pale.     Findings: No bruising, erythema, lesion or rash.  Neurological:     Mental Status: She is alert and oriented to person, place, and time. Mental status is at baseline.  Psychiatric:        Mood and Affect: Mood normal.        Behavior: Behavior normal.        Thought Content: Thought content normal.        Judgment: Judgment normal.     Results for orders placed or performed in visit  on 04/26/19  Comprehensive metabolic panel  Result Value Ref Range   Glucose 124 (H) 65 - 99 mg/dL   BUN 14 8 - 27 mg/dL   Creatinine, Ser 0.90 0.57 - 1.00 mg/dL   GFR calc non Af Amer 64 >59 mL/min/1.73   GFR calc Af Amer 74 >59 mL/min/1.73   BUN/Creatinine Ratio 16 12 - 28   Sodium 140 134 - 144 mmol/L   Potassium 3.8 3.5 - 5.2 mmol/L   Chloride 101 96 - 106 mmol/L   CO2 25 20 - 29 mmol/L   Calcium 9.2 8.7 - 10.3 mg/dL   Total Protein 6.5 6.0 - 8.5 g/dL   Albumin 4.2 3.7 - 4.7 g/dL   Globulin, Total 2.3 1.5 - 4.5 g/dL   Albumin/Globulin Ratio 1.8 1.2 - 2.2   Bilirubin Total 0.5 0.0 - 1.2 mg/dL   Alkaline Phosphatase 84 39 - 117  IU/L   AST 33 0 - 40 IU/L   ALT 29 0 - 32 IU/L  Uric acid  Result Value Ref Range   Uric Acid 6.6 3.1 - 7.9 mg/dL  CBC with Differential/Platelet  Result Value Ref Range   WBC 6.8 3.4 - 10.8 x10E3/uL   RBC 3.83 3.77 - 5.28 x10E6/uL   Hemoglobin 13.1 11.1 - 15.9 g/dL   Hematocrit 38.4 34.0 - 46.6 %   MCV 100 (H) 79 - 97 fL   MCH 34.2 (H) 26.6 - 33.0 pg   MCHC 34.1 31.5 - 35.7 g/dL   RDW 11.8 11.7 - 15.4 %   Platelets 172 150 - 450 x10E3/uL   Neutrophils 61 Not Estab. %   Lymphs 24 Not Estab. %   Monocytes 13 Not Estab. %   Eos 2 Not Estab. %   Basos 0 Not Estab. %   Neutrophils Absolute 4.1 1.4 - 7.0 x10E3/uL   Lymphocytes Absolute 1.6 0.7 - 3.1 x10E3/uL   Monocytes Absolute 0.9 0.1 - 0.9 x10E3/uL   EOS (ABSOLUTE) 0.2 0.0 - 0.4 x10E3/uL   Basophils Absolute 0.0 0.0 - 0.2 x10E3/uL   Immature Granulocytes 0 Not Estab. %   Immature Grans (Abs) 0.0 0.0 - 0.1 x10E3/uL      Assessment & Plan:   Problem List Items Addressed This Visit      Cardiovascular and Mediastinum   Essential hypertension    Under good control on current regimen. Continue current regimen. Continue to monitor. Call with any concerns. Refills given. Labs to be drawn shortly.        Relevant Medications   atorvastatin (LIPITOR) 10 MG tablet   losartan (COZAAR) 50 MG tablet   Other Relevant Orders   CBC with Differential/Platelet   Comprehensive metabolic panel   Microalbumin, Urine Waived     Endocrine   IFG (impaired fasting glucose) - Primary    Will check labs. Await results. Treat as needed.       Relevant Orders   Bayer DCA Hb A1c Waived   CBC with Differential/Platelet   Comprehensive metabolic panel     Other   Pure hypercholesterolemia    Under good control on current regimen. Continue current regimen. Continue to monitor. Call with any concerns. Refills given. Labs to be drawn shortly.       Relevant Medications   atorvastatin (LIPITOR) 10 MG tablet   losartan (COZAAR) 50 MG  tablet   Other Relevant Orders   CBC with Differential/Platelet   Comprehensive metabolic panel   Lipid Panel w/o Chol/HDL Ratio   B12  deficiency    Will check labs. Await results. Treat as needed.       Relevant Orders   CBC with Differential/Platelet   Comprehensive metabolic panel   123456   Vitamin D deficiency    Will check labs. Await results. Treat as needed.       Relevant Orders   CBC with Differential/Platelet   Comprehensive metabolic panel   VITAMIN D 25 Hydroxy (Vit-D Deficiency, Fractures)   Elevated TSH    Will check labs. Await results. Treat as needed.       Relevant Orders   CBC with Differential/Platelet   Comprehensive metabolic panel   TSH   Gout    Will check labs. Await results. Treat as needed.       Relevant Orders   CBC with Differential/Platelet   Comprehensive metabolic panel   Uric acid    Other Visit Diagnoses    Hypothyroidism, unspecified type       Will check labs. Await results. Treat as needed.    Relevant Orders   TSH       Follow up plan: Return in about 6 months (around 12/22/2019) for Physical.   . This visit was completed via MyChart due to the restrictions of the COVID-19 pandemic. All issues as above were discussed and addressed. Physical exam was done as above through visual confirmation on MyChart. If it was felt that the patient should be evaluated in the office, they were directed there. The patient verbally consented to this visit. . Location of the patient: home . Location of the provider: home . Those involved with this call:  . Provider: Park Liter, DO . CMA: Yvonna Alanis, [place] . Front Desk/Registration: Don Perking  . Time spent on call: 25 minutes with patient face to face via video conference. More than 50% of this time was spent in counseling and coordination of care. 40 minutes total spent in review of patient's record and preparation of their chart.

## 2019-06-22 ENCOUNTER — Encounter: Payer: Self-pay | Admitting: Family Medicine

## 2019-06-22 NOTE — Assessment & Plan Note (Signed)
Will check labs. Await results. Treat as needed.

## 2019-06-22 NOTE — Assessment & Plan Note (Signed)
Under good control on current regimen. Continue current regimen. Continue to monitor. Call with any concerns. Refills given. Labs to be drawn shortly.  

## 2019-06-29 DIAGNOSIS — I10 Essential (primary) hypertension: Secondary | ICD-10-CM | POA: Diagnosis not present

## 2019-06-29 DIAGNOSIS — E785 Hyperlipidemia, unspecified: Secondary | ICD-10-CM | POA: Diagnosis not present

## 2019-06-29 DIAGNOSIS — R002 Palpitations: Secondary | ICD-10-CM | POA: Diagnosis not present

## 2019-06-29 DIAGNOSIS — R600 Localized edema: Secondary | ICD-10-CM | POA: Diagnosis not present

## 2019-06-29 DIAGNOSIS — E7800 Pure hypercholesterolemia, unspecified: Secondary | ICD-10-CM | POA: Diagnosis not present

## 2019-07-02 IMAGING — MG DIGITAL SCREENING BILATERAL MAMMOGRAM WITH TOMO AND CAD
8 series · 8 of 24 positions shown · non-contrast
Comparison: Previous exam(s).

CLINICAL DATA: Screening.

EXAM:
DIGITAL SCREENING BILATERAL MAMMOGRAM WITH TOMO AND CAD

[L MLO synth-2D]
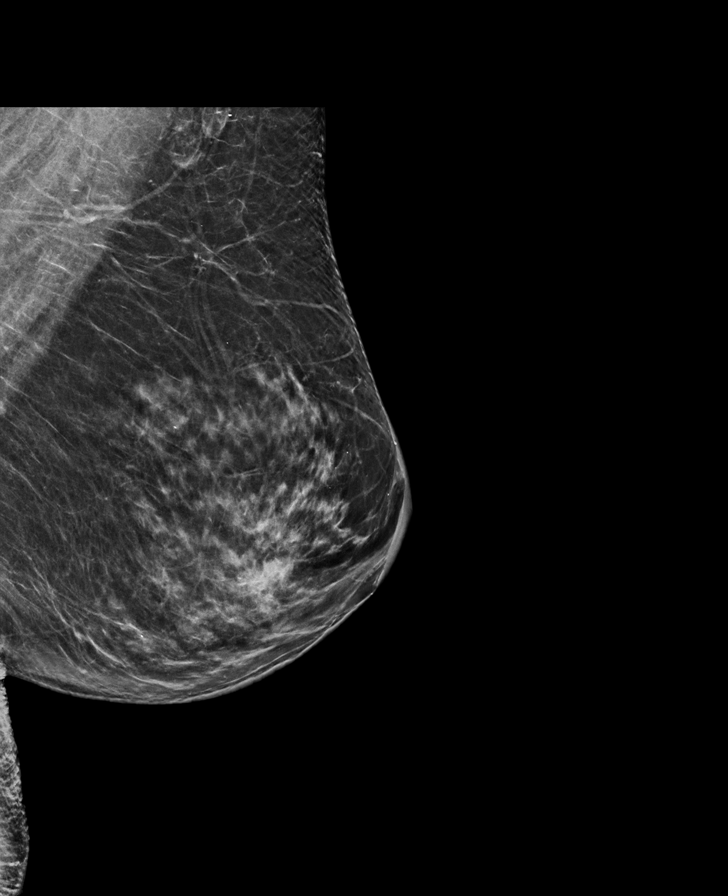

[L CC synth-2D]
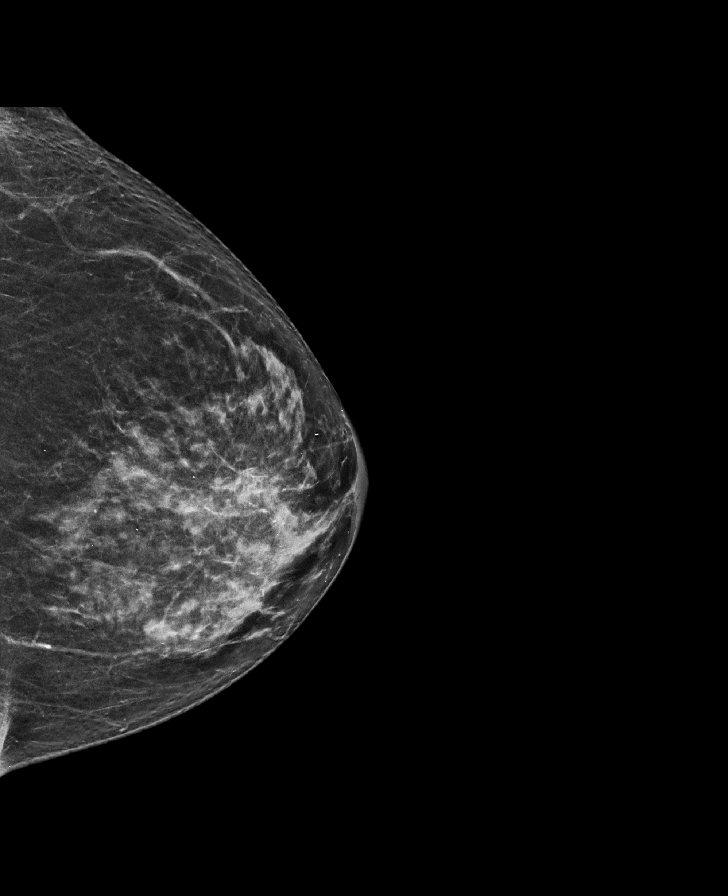

[R MLO synth-2D]
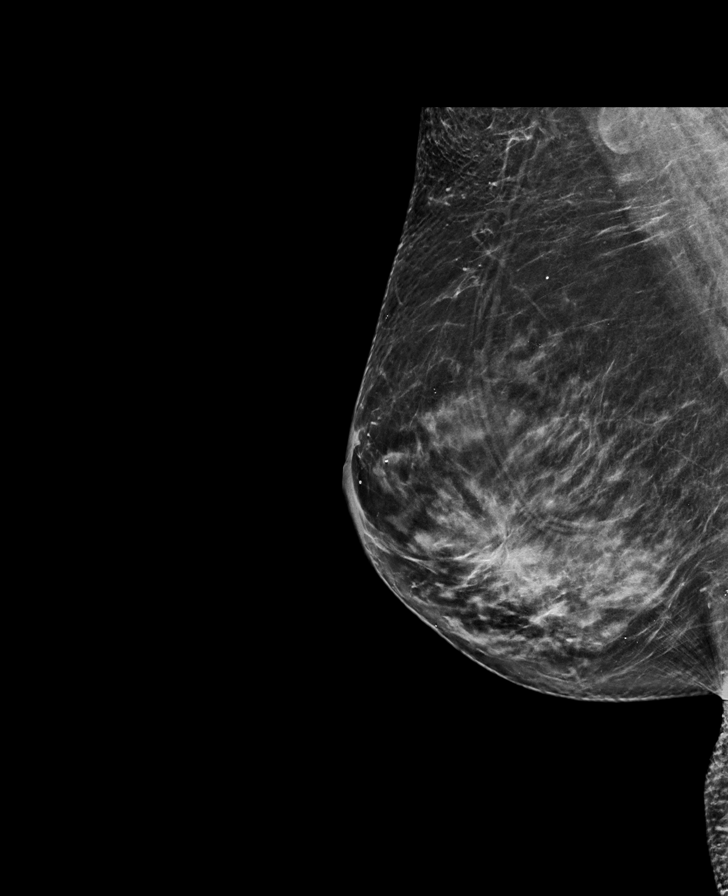

[R CC synth-2D]
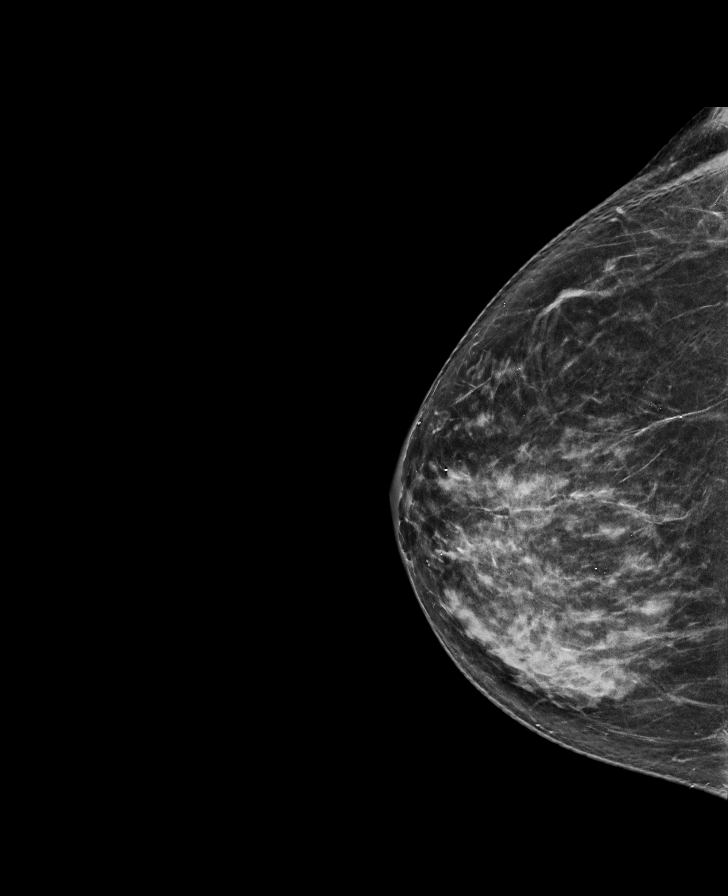

[R CC tomo · tomo slice 37/74.0]
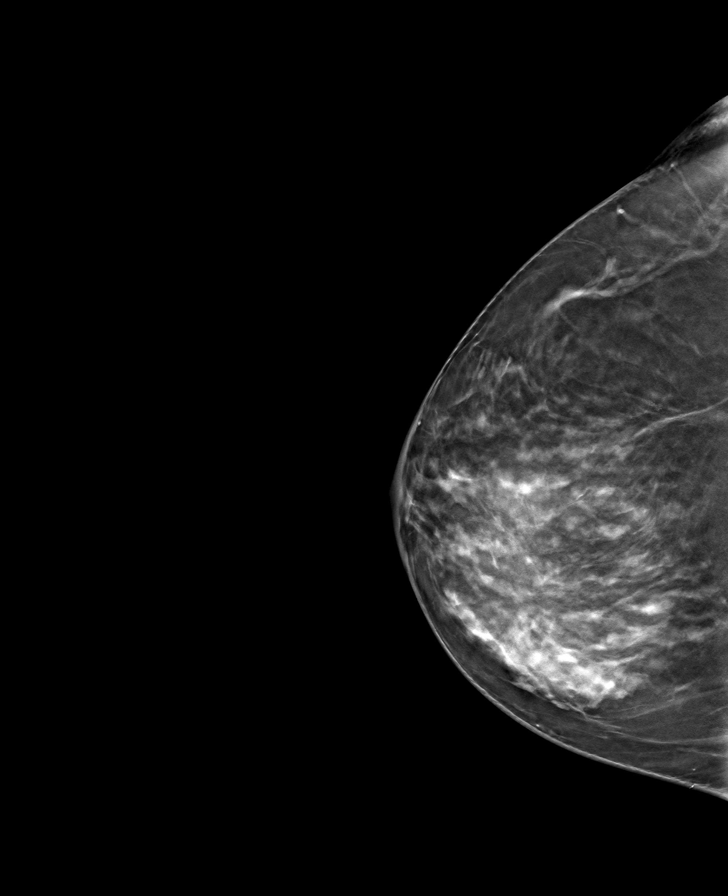

[R MLO tomo · tomo slice 39/78.0]
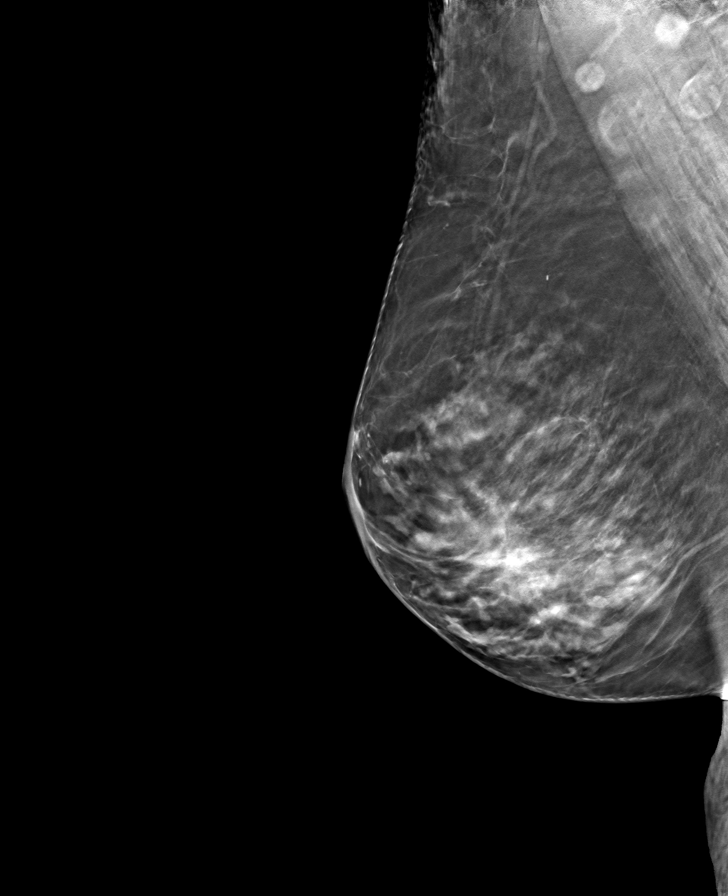

[L CC tomo · tomo slice 35/69.0]
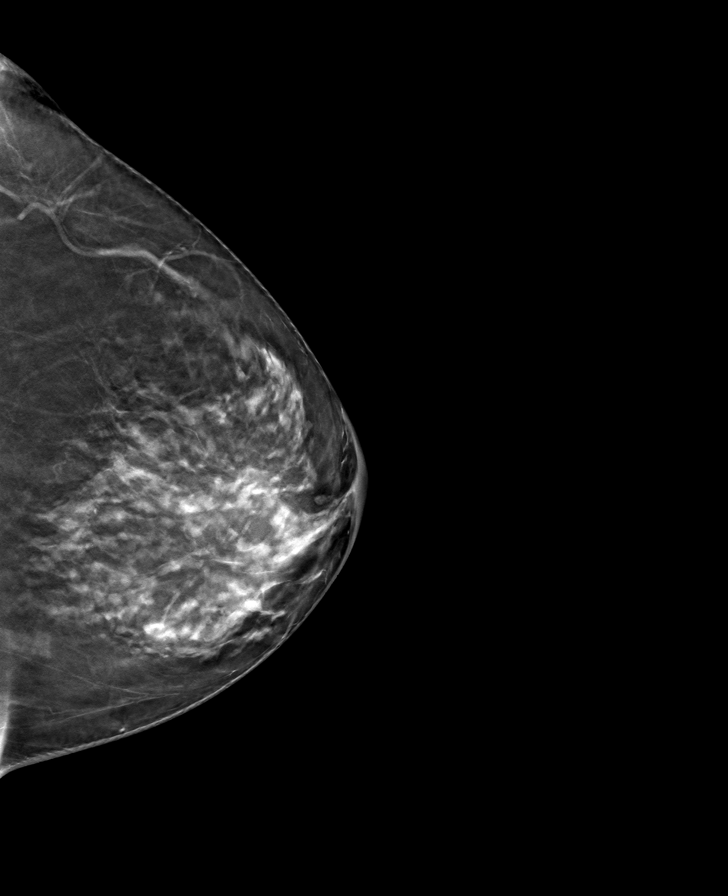

[L MLO tomo · tomo slice 37/74.0]
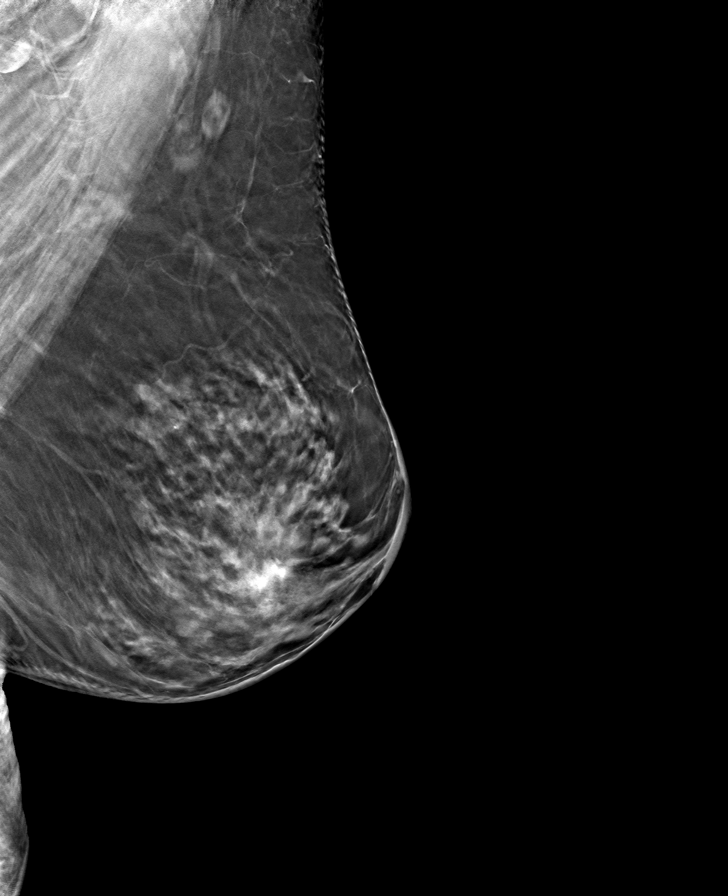

[8 of 24 positions shown; findings below may reference images not displayed]

ACR Breast Density Category c: The breast tissue is heterogeneously
dense, which may obscure small masses.
FINDINGS: In the left breast, a possible asymmetry warrants further
evaluation. In the right breast, no findings suspicious for
malignancy. Images were processed with CAD.
IMPRESSION: Further evaluation is suggested for possible asymmetry in the left
breast.

RECOMMENDATION:
Diagnostic mammogram and possibly ultrasound of the left breast.
(Code:[id number])

The patient will be contacted regarding the findings, and additional
imaging will be scheduled.

BI-RADS CATEGORY  0: Incomplete. Need additional imaging evaluation
and/or prior mammograms for comparison.

## 2019-07-03 ENCOUNTER — Ambulatory Visit: Payer: Medicare Other | Admitting: Family Medicine

## 2019-07-04 DIAGNOSIS — K120 Recurrent oral aphthae: Secondary | ICD-10-CM | POA: Diagnosis not present

## 2019-07-13 DIAGNOSIS — M47816 Spondylosis without myelopathy or radiculopathy, lumbar region: Secondary | ICD-10-CM | POA: Diagnosis not present

## 2019-07-13 DIAGNOSIS — M549 Dorsalgia, unspecified: Secondary | ICD-10-CM | POA: Diagnosis not present

## 2019-07-13 DIAGNOSIS — Z79899 Other long term (current) drug therapy: Secondary | ICD-10-CM | POA: Diagnosis not present

## 2019-07-13 DIAGNOSIS — G894 Chronic pain syndrome: Secondary | ICD-10-CM | POA: Diagnosis not present

## 2019-07-13 DIAGNOSIS — M169 Osteoarthritis of hip, unspecified: Secondary | ICD-10-CM | POA: Diagnosis not present

## 2019-07-13 DIAGNOSIS — Z79891 Long term (current) use of opiate analgesic: Secondary | ICD-10-CM | POA: Diagnosis not present

## 2019-07-19 DIAGNOSIS — K120 Recurrent oral aphthae: Secondary | ICD-10-CM | POA: Diagnosis not present

## 2019-07-19 DIAGNOSIS — K146 Glossodynia: Secondary | ICD-10-CM | POA: Diagnosis not present

## 2019-08-07 ENCOUNTER — Other Ambulatory Visit: Payer: Self-pay

## 2019-08-07 ENCOUNTER — Other Ambulatory Visit: Payer: Medicare Other

## 2019-08-07 DIAGNOSIS — R7989 Other specified abnormal findings of blood chemistry: Secondary | ICD-10-CM | POA: Diagnosis not present

## 2019-08-07 DIAGNOSIS — E538 Deficiency of other specified B group vitamins: Secondary | ICD-10-CM | POA: Diagnosis not present

## 2019-08-07 DIAGNOSIS — I10 Essential (primary) hypertension: Secondary | ICD-10-CM

## 2019-08-07 DIAGNOSIS — M1A9XX0 Chronic gout, unspecified, without tophus (tophi): Secondary | ICD-10-CM | POA: Diagnosis not present

## 2019-08-07 DIAGNOSIS — E559 Vitamin D deficiency, unspecified: Secondary | ICD-10-CM | POA: Diagnosis not present

## 2019-08-07 DIAGNOSIS — E039 Hypothyroidism, unspecified: Secondary | ICD-10-CM

## 2019-08-07 DIAGNOSIS — K146 Glossodynia: Secondary | ICD-10-CM | POA: Diagnosis not present

## 2019-08-07 DIAGNOSIS — R7301 Impaired fasting glucose: Secondary | ICD-10-CM

## 2019-08-07 DIAGNOSIS — E7800 Pure hypercholesterolemia, unspecified: Secondary | ICD-10-CM

## 2019-08-07 LAB — BAYER DCA HB A1C WAIVED: HB A1C (BAYER DCA - WAIVED): 5.5 % (ref <7.0)

## 2019-08-07 LAB — MICROALBUMIN, URINE WAIVED
Creatinine, Urine Waived: 100 mg/dL (ref 10–300)
Microalb, Ur Waived: 10 mg/L (ref 0–19)
Microalb/Creat Ratio: <30 mg/g (ref <30)

## 2019-08-08 ENCOUNTER — Other Ambulatory Visit: Payer: Self-pay | Admitting: Family Medicine

## 2019-08-08 DIAGNOSIS — E538 Deficiency of other specified B group vitamins: Secondary | ICD-10-CM

## 2019-08-08 MED ORDER — CYANOCOBALAMIN 1000 MCG/ML IJ SOLN
1000.0000 ug | INTRAMUSCULAR | Status: AC
  Administered 2019-08-09: 1000 ug via INTRAMUSCULAR

## 2019-08-08 MED ORDER — CYANOCOBALAMIN 1000 MCG/ML IJ SOLN: 1000.0000 ug | INTRAMUSCULAR | Status: DC

## 2019-08-09 ENCOUNTER — Other Ambulatory Visit: Payer: Self-pay

## 2019-08-09 ENCOUNTER — Ambulatory Visit (INDEPENDENT_AMBULATORY_CARE_PROVIDER_SITE_OTHER): Payer: Medicare Other

## 2019-08-09 DIAGNOSIS — E538 Deficiency of other specified B group vitamins: Secondary | ICD-10-CM | POA: Diagnosis not present

## 2019-08-10 DIAGNOSIS — B370 Candidal stomatitis: Secondary | ICD-10-CM | POA: Diagnosis not present

## 2019-08-10 DIAGNOSIS — R682 Dry mouth, unspecified: Secondary | ICD-10-CM | POA: Diagnosis not present

## 2019-08-10 DIAGNOSIS — K146 Glossodynia: Secondary | ICD-10-CM | POA: Diagnosis not present

## 2019-08-10 LAB — COMPREHENSIVE METABOLIC PANEL
ALT: 41 IU/L — ABNORMAL HIGH (ref 0–32)
AST: 54 IU/L — ABNORMAL HIGH (ref 0–40)
Albumin/Globulin Ratio: 1.7 (ref 1.2–2.2)
Albumin: 4.2 g/dL (ref 3.7–4.7)
Alkaline Phosphatase: 82 IU/L (ref 39–117)
BUN/Creatinine Ratio: 13 (ref 12–28)
BUN: 12 mg/dL (ref 8–27)
Bilirubin Total: 0.8 mg/dL (ref 0.0–1.2)
CO2: 21 mmol/L (ref 20–29)
Calcium: 9.7 mg/dL (ref 8.7–10.3)
Chloride: 105 mmol/L (ref 96–106)
Creatinine, Ser: 0.89 mg/dL (ref 0.57–1.00)
GFR calc Af Amer: 74 mL/min/{1.73_m2} (ref >59)
GFR calc non Af Amer: 65 mL/min/{1.73_m2} (ref >59)
Globulin, Total: 2.5 g/dL (ref 1.5–4.5)
Glucose: 114 mg/dL — ABNORMAL HIGH (ref 65–99)
Potassium: 4.3 mmol/L (ref 3.5–5.2)
Sodium: 142 mmol/L (ref 134–144)
Total Protein: 6.7 g/dL (ref 6.0–8.5)

## 2019-08-10 LAB — CBC WITH DIFFERENTIAL/PLATELET
Basophils Absolute: 0 10*3/uL (ref 0.0–0.2)
Basos: 1 %
EOS (ABSOLUTE): 0.1 10*3/uL (ref 0.0–0.4)
Eos: 3 %
Hematocrit: 41.2 % (ref 34.0–46.6)
Hemoglobin: 13.8 g/dL (ref 11.1–15.9)
Immature Grans (Abs): 0 10*3/uL (ref 0.0–0.1)
Immature Granulocytes: 0 %
Lymphocytes Absolute: 1.6 10*3/uL (ref 0.7–3.1)
Lymphs: 36 %
MCH: 33.7 pg — ABNORMAL HIGH (ref 26.6–33.0)
MCHC: 33.5 g/dL (ref 31.5–35.7)
MCV: 101 fL — ABNORMAL HIGH (ref 79–97)
Monocytes Absolute: 0.5 10*3/uL (ref 0.1–0.9)
Monocytes: 11 %
Neutrophils Absolute: 2.1 10*3/uL (ref 1.4–7.0)
Neutrophils: 49 %
Platelets: 170 10*3/uL (ref 150–450)
RBC: 4.1 x10E6/uL (ref 3.77–5.28)
RDW: 12 % (ref 11.7–15.4)
WBC: 4.4 10*3/uL (ref 3.4–10.8)

## 2019-08-10 LAB — URIC ACID: Uric Acid: 6.5 mg/dL (ref 3.1–7.9)

## 2019-08-10 LAB — LIPID PANEL W/O CHOL/HDL RATIO
Cholesterol, Total: 173 mg/dL (ref 100–199)
HDL: 47 mg/dL (ref >39)
LDL Chol Calc (NIH): 105 mg/dL — ABNORMAL HIGH (ref 0–99)
Triglycerides: 116 mg/dL (ref 0–149)
VLDL Cholesterol Cal: 21 mg/dL (ref 5–40)

## 2019-08-10 LAB — VITAMIN D 25 HYDROXY (VIT D DEFICIENCY, FRACTURES): Vit D, 25-Hydroxy: 24.7 ng/mL — ABNORMAL LOW (ref 30.0–100.0)

## 2019-08-10 LAB — VITAMIN B12: Vitamin B-12: 249 pg/mL (ref 232–1245)

## 2019-08-10 LAB — FOLATE: Folate: 6.2 ng/mL (ref >3.0)

## 2019-08-10 LAB — ZINC: Zinc: 72 ug/dL (ref 44–115)

## 2019-08-10 LAB — TSH: TSH: 3.07 u[IU]/mL (ref 0.450–4.500)

## 2019-09-12 DIAGNOSIS — M542 Cervicalgia: Secondary | ICD-10-CM | POA: Diagnosis not present

## 2019-09-12 DIAGNOSIS — G894 Chronic pain syndrome: Secondary | ICD-10-CM | POA: Diagnosis not present

## 2019-09-12 DIAGNOSIS — M549 Dorsalgia, unspecified: Secondary | ICD-10-CM | POA: Diagnosis not present

## 2019-09-12 DIAGNOSIS — M47816 Spondylosis without myelopathy or radiculopathy, lumbar region: Secondary | ICD-10-CM | POA: Diagnosis not present

## 2019-10-26 ENCOUNTER — Ambulatory Visit
Admission: RE | Admit: 2019-10-26 | Discharge: 2019-10-26 | Disposition: A | Discharge status: Home / Self Care | Payer: Medicare Other | Source: Home / Self Care | Attending: Physician Assistant | Admitting: Physician Assistant

## 2019-10-26 ENCOUNTER — Ambulatory Visit
Admission: RE | Admit: 2019-10-26 | Discharge: 2019-10-26 | Disposition: A | Discharge status: Home / Self Care | Payer: Medicare Other | Source: Ambulatory Visit | Attending: Physician Assistant | Admitting: Physician Assistant

## 2019-10-26 ENCOUNTER — Other Ambulatory Visit: Payer: Self-pay | Admitting: Physician Assistant

## 2019-10-26 ENCOUNTER — Ambulatory Visit
Admission: RE | Admit: 2019-10-26 | Discharge: 2019-10-26 | Disposition: A | Discharge status: Home / Self Care | Payer: Medicare Other | Attending: Physician Assistant | Admitting: Physician Assistant

## 2019-10-26 DIAGNOSIS — M545 Low back pain, unspecified: Secondary | ICD-10-CM

## 2019-10-26 DIAGNOSIS — M47812 Spondylosis without myelopathy or radiculopathy, cervical region: Secondary | ICD-10-CM | POA: Diagnosis not present

## 2019-10-26 DIAGNOSIS — M542 Cervicalgia: Secondary | ICD-10-CM

## 2019-10-26 DIAGNOSIS — M549 Dorsalgia, unspecified: Secondary | ICD-10-CM | POA: Diagnosis not present

## 2019-10-26 DIAGNOSIS — M546 Pain in thoracic spine: Secondary | ICD-10-CM | POA: Diagnosis not present

## 2019-10-30 ENCOUNTER — Encounter: Payer: Self-pay | Admitting: Dermatology

## 2019-10-30 ENCOUNTER — Ambulatory Visit (INDEPENDENT_AMBULATORY_CARE_PROVIDER_SITE_OTHER): Payer: Medicare Other | Admitting: Dermatology

## 2019-10-30 ENCOUNTER — Other Ambulatory Visit: Payer: Self-pay

## 2019-10-30 DIAGNOSIS — L578 Other skin changes due to chronic exposure to nonionizing radiation: Secondary | ICD-10-CM

## 2019-10-30 DIAGNOSIS — L814 Other melanin hyperpigmentation: Secondary | ICD-10-CM | POA: Diagnosis not present

## 2019-10-30 DIAGNOSIS — L820 Inflamed seborrheic keratosis: Secondary | ICD-10-CM | POA: Diagnosis not present

## 2019-10-30 DIAGNOSIS — L821 Other seborrheic keratosis: Secondary | ICD-10-CM

## 2019-10-30 DIAGNOSIS — Z1283 Encounter for screening for malignant neoplasm of skin: Secondary | ICD-10-CM

## 2019-10-30 DIAGNOSIS — D489 Neoplasm of uncertain behavior, unspecified: Secondary | ICD-10-CM

## 2019-10-30 DIAGNOSIS — C44319 Basal cell carcinoma of skin of other parts of face: Secondary | ICD-10-CM | POA: Diagnosis not present

## 2019-10-30 DIAGNOSIS — C4491 Basal cell carcinoma of skin, unspecified: Secondary | ICD-10-CM

## 2019-10-30 DIAGNOSIS — L409 Psoriasis, unspecified: Secondary | ICD-10-CM

## 2019-10-30 HISTORY — DX: Basal cell carcinoma of skin, unspecified: C44.91

## 2019-10-30 MED ORDER — CLOBETASOL PROPIONATE 0.05 % EX SOLN
1.0000 | Freq: Two times a day (BID) | CUTANEOUS | 3 refills | Status: DC
Start: 2019-10-30 — End: 2022-08-27

## 2019-10-30 NOTE — Progress Notes (Signed)
Follow-Up Visit   Subjective  Tamara Fleming is a 74 y.o. female who presents for the following: Psoriasis and Areas of concern (Back of right shoulder, right side of face and on back). The patient presents for Upper Body Skin Exam (UBSE) for skin cancer screening and mole check.  Patient presents today for Evaluation of her Psoriasis, which she states is very itchy, and has a few areas of concern that she would to have looked at on the back of her right shoulder, right side to face and on her back.  The following portions of the chart were reviewed this encounter and updated as appropriate:  Tobacco  Allergies  Meds  Problems  Med Hx  Surg Hx  Fam Hx      Review of Systems:  No other skin or systemic complaints except as noted in HPI or Assessment and Plan.  Objective  Well appearing patient in no apparent distress; mood and affect are within normal limits.  A focused examination was performed including Scalp, Right posterior shoulder, Right face and Back. Relevant physical exam findings are noted in the Assessment and Plan.  Objective  Posterior scalp: Scaly plaques posterior scalp  Objective  Left Upper Back, Right posterior axillary fold: Erythematous keratotic or waxy stuck-on papule or plaque.   Objective  Left Chin: 0.6 cm  pigmented pink patch      Assessment & Plan    Psoriasis Posterior scalp  Start Clobetasol sol.apply to affected areas on scalp daily for 2 weeks then decrease to daily for 5 days a week Recommend Tar shampoo Recommend shampooing scalp daily  Xtrax laser treatment  clobetasol (TEMOVATE) 0.05 % external solution - Posterior scalp  Inflamed seborrheic keratosis (2) Left Upper Back; Right posterior axillary fold  Cryotherapy today Prior to procedure, discussed risks of blister formation, small wound, skin dyspigmentation, or rare scar following cryotherapy.    Destruction of lesion - Left Upper Back, Right posterior axillary  fold Complexity: simple   Destruction method: cryotherapy   Informed consent: discussed and consent obtained   Timeout:  patient name, date of birth, surgical site, and procedure verified Lesion destroyed using liquid nitrogen: Yes   Region frozen until ice ball extended beyond lesion: Yes   Outcome: patient tolerated procedure well with no complications   Post-procedure details: wound care instructions given    Neoplasm of uncertain behavior Left Chin  Skin / nail biopsy Type of biopsy: tangential   Informed consent: discussed and consent obtained   Timeout: patient name, date of birth, surgical site, and procedure verified   Procedure prep:  Patient was prepped and draped in usual sterile fashion Prep type:  Isopropyl alcohol Anesthesia: the lesion was anesthetized in a standard fashion   Anesthetic:  1% lidocaine w/ epinephrine 1-100,000 buffered w/ 8.4% NaHCO3 Instrument used: flexible razor blade   Hemostasis achieved with: pressure, aluminum chloride and electrodesiccation   Outcome: patient tolerated procedure well   Post-procedure details: sterile dressing applied and wound care instructions given   Dressing type: bandage and petrolatum    Specimen 1 - Surgical pathology Differential Diagnosis: isk vs pigmented bcc Check Margins: No  Shave biopsy   Skin cancer screening  Actinic Damage - diffuse scaly erythematous macules with underlying dyspigmentation - Recommend daily broad spectrum sunscreen SPF 30+ to sun-exposed areas, reapply every 2 hours as needed.  - Call for new or changing lesions.  Seborrheic Keratoses - Stuck-on, waxy, tan-brown papules and plaques  - Discussed benign etiology and  prognosis. - Observe - Call for any changes  Lentigines - Scattered tan macules - Discussed due to sun exposure - Benign, observe - Call for any changes   Return in about 6 weeks (around 12/11/2019) for psoriasis.   Marene Lenz, CMA, am acting as scribe for  Sarina Ser, MD . Documentation: I have reviewed the above documentation for accuracy and completeness, and I agree with the above.  Sarina Ser, MD

## 2019-10-30 NOTE — Patient Instructions (Addendum)

## 2019-11-01 ENCOUNTER — Telehealth: Payer: Self-pay

## 2019-11-01 NOTE — Telephone Encounter (Signed)
Spoke with husband, he stated that she will be home around 430 and to call back around that time,

## 2019-11-02 NOTE — Telephone Encounter (Signed)
Patient called and informed of biopsy results, patient verbalized understanding.  

## 2019-11-08 DIAGNOSIS — Z79899 Other long term (current) drug therapy: Secondary | ICD-10-CM | POA: Diagnosis not present

## 2019-11-08 DIAGNOSIS — M549 Dorsalgia, unspecified: Secondary | ICD-10-CM | POA: Diagnosis not present

## 2019-11-08 DIAGNOSIS — M542 Cervicalgia: Secondary | ICD-10-CM | POA: Diagnosis not present

## 2019-11-08 DIAGNOSIS — M47816 Spondylosis without myelopathy or radiculopathy, lumbar region: Secondary | ICD-10-CM | POA: Diagnosis not present

## 2019-11-08 DIAGNOSIS — Z79891 Long term (current) use of opiate analgesic: Secondary | ICD-10-CM | POA: Diagnosis not present

## 2019-11-08 DIAGNOSIS — G894 Chronic pain syndrome: Secondary | ICD-10-CM | POA: Diagnosis not present

## 2019-11-09 DIAGNOSIS — E559 Vitamin D deficiency, unspecified: Secondary | ICD-10-CM | POA: Diagnosis not present

## 2019-11-09 DIAGNOSIS — I10 Essential (primary) hypertension: Secondary | ICD-10-CM | POA: Diagnosis not present

## 2019-11-09 DIAGNOSIS — E7800 Pure hypercholesterolemia, unspecified: Secondary | ICD-10-CM | POA: Diagnosis not present

## 2019-11-09 DIAGNOSIS — E7849 Other hyperlipidemia: Secondary | ICD-10-CM | POA: Diagnosis not present

## 2019-11-09 DIAGNOSIS — R002 Palpitations: Secondary | ICD-10-CM | POA: Diagnosis not present

## 2019-11-09 DIAGNOSIS — R600 Localized edema: Secondary | ICD-10-CM | POA: Diagnosis not present

## 2019-11-14 ENCOUNTER — Ambulatory Visit (INDEPENDENT_AMBULATORY_CARE_PROVIDER_SITE_OTHER): Payer: Medicare Other | Admitting: Dermatology

## 2019-11-14 ENCOUNTER — Other Ambulatory Visit: Payer: Self-pay

## 2019-11-14 DIAGNOSIS — C44319 Basal cell carcinoma of skin of other parts of face: Secondary | ICD-10-CM | POA: Diagnosis not present

## 2019-11-14 NOTE — Progress Notes (Signed)
    Follow-Up Visit   Subjective  Tamara Fleming is a 74 y.o. female who presents for the following: biopsy proven superficial Basal cell carcinoma (of the left chin - patient is here today for [facility]).  The following portions of the chart were reviewed this encounter and updated as appropriate:  Tobacco  Allergies  Meds  Problems  Med Hx  Surg Hx  Fam Hx     Review of Systems:  No other skin or systemic complaints except as noted in HPI or Assessment and Plan.  Objective  Well appearing patient in no apparent distress; mood and affect are within normal limits.  A focused examination was performed including the face. Relevant physical exam findings are noted in the Assessment and Plan.  Objective  L chin: Pink biopsy site - irregular skin extends out larger than original biopsy 2.3 cm      Assessment & Plan  Basal cell carcinoma (BCC) superficial L chin - biopsy proven L chin  Destruction of lesion Complexity: extensive   Destruction method: electrodesiccation and curettage   Informed consent: discussed and consent obtained   Timeout:  patient name, date of birth, surgical site, and procedure verified Procedure prep:  Patient was prepped and draped in usual sterile fashion Prep type:  Isopropyl alcohol Anesthesia: the lesion was anesthetized in a standard fashion   Anesthetic:  1% lidocaine w/ epinephrine 1-100,000 buffered w/ 8.4% NaHCO3 Curettage performed in three different directions: Yes   Electrodesiccation performed over the curetted area: Yes   Lesion length (cm):  0.7 Lesion width (cm):  0.7 Margin per side (cm):  0.2 Final wound size (cm):  1.1 Hemostasis achieved with:  pressure, aluminum chloride and electrodesiccation Outcome: patient tolerated procedure well with no complications   Post-procedure details: sterile dressing applied and wound care instructions given   Dressing type: bandage and petrolatum    Discussed ED&C to the entire area, or  treating biopsy site with ED&C and treating irregular skin surrounding area (AK vs more superficial BCC? Vs actinic changes?) with 5FU/Calcipotriene mix BID x 1 week. (this will avoid additional biopsy and more aggressive EDC, but still effectively treat area even if it is more superficial BCC)  Return in about 8 weeks (around 01/09/2020).  Will plan topical 5-FU adjunct treatment at that time.  Tamara Fleming, CMA, am acting as scribe for Sarina Ser, MD .  Documentation: I have reviewed the above documentation for accuracy and completeness, and I agree with the above.  Sarina Ser, MD

## 2019-11-14 NOTE — Patient Instructions (Addendum)
Instructions for [facility] Medications  One or more of your medications was sent to the [facility] mail order compounding pharmacy. You will receive an email from them and can purchase the medicine through that link. It will then be mailed to your home at the address you confirmed. If for any reason you do not receive an email from them, please check your spam folder. If you still do not find the email, please let us know.   Electrodesiccation and Curettage (Scrape and Burn) Wound Care Instructions  1. Leave the original bandage on for 24 hours if possible.  If the bandage becomes soaked or soiled before that time, it is OK to remove it and examine the wound.  A small amount of post-operative bleeding is normal.  If excessive bleeding occurs, remove the bandage, place gauze over the site and apply continuous pressure (no peeking) over the area for 30 minutes. If this does not work, please call our clinic as soon as possible or page your doctor if it is after hours.   2. Once a day, cleanse the wound with soap and water. It is fine to shower. If a thick crust develops you may use a Q-tip dipped into dilute hydrogen peroxide (mix 1:1 with water) to dissolve it.  Hydrogen peroxide can slow the healing process, so use it only as needed.    3. After washing, apply petroleum jelly (Vaseline) or an antibiotic ointment if your doctor prescribed one for you, followed by a bandage.    4. For best healing, the wound should be covered with a layer of ointment at all times. If you are not able to keep the area covered with a bandage to hold the ointment in place, this may mean re-applying the ointment several times a day.  Continue this wound care until the wound has healed and is no longer open. It may take several weeks for the wound to heal and close.  Itching and mild discomfort is normal during the healing process.  If you have any discomfort, you can take Tylenol (acetaminophen) or ibuprofen as  directed on the bottle. (Please do not take these if you have an allergy to them or cannot take them for another reason).  Some redness, tenderness and white or yellow material in the wound is normal healing.  If the area becomes very sore and red, or develops a thick yellow-green material (pus), it may be infected; please notify us.    Wound healing continues for up to one year following surgery. It is not unusual to experience pain in the scar from time to time during the interval.  If the pain becomes severe or the scar thickens, you should notify the office.    A slight amount of redness in a scar is expected for the first six months.  After six months, the redness will fade and the scar will soften and fade.  The color difference becomes less noticeable with time.  If there are any problems, return for a post-op surgery check at your earliest convenience.  To improve the appearance of the scar, you can use silicone scar gel, cream, or sheets (such as Mederma or Serica) every night for up to one year. These are available over the counter (without a prescription).  Please call our office at [phone] for any questions or concerns.

## 2019-11-16 ENCOUNTER — Encounter: Payer: Self-pay | Admitting: Dermatology

## 2019-12-13 ENCOUNTER — Other Ambulatory Visit: Payer: Self-pay

## 2019-12-13 ENCOUNTER — Encounter: Payer: Self-pay | Admitting: Dermatology

## 2019-12-13 ENCOUNTER — Ambulatory Visit (INDEPENDENT_AMBULATORY_CARE_PROVIDER_SITE_OTHER): Payer: Medicare Other | Admitting: Dermatology

## 2019-12-13 DIAGNOSIS — C4491 Basal cell carcinoma of skin, unspecified: Secondary | ICD-10-CM

## 2019-12-13 DIAGNOSIS — L570 Actinic keratosis: Secondary | ICD-10-CM | POA: Diagnosis not present

## 2019-12-13 DIAGNOSIS — L578 Other skin changes due to chronic exposure to nonionizing radiation: Secondary | ICD-10-CM | POA: Diagnosis not present

## 2019-12-13 DIAGNOSIS — C44319 Basal cell carcinoma of skin of other parts of face: Secondary | ICD-10-CM | POA: Diagnosis not present

## 2019-12-13 NOTE — Patient Instructions (Signed)
5-Fluorouracil/Calcipotriene Patient Education  ° °Actinic keratoses are the dry, red scaly spots on the skin caused by sun damage. A portion of these spots can turn into skin cancer with time, and treating them can help prevent development of skin cancer.  ° °Treatment of these spots requires removal of the defective skin cells. There are various ways to remove actinic keratoses, including freezing with liquid nitrogen, treatment with creams, or treatment with a blue light procedure in the office.  ° °5-fluorouracil cream is a topical cream used to treat actinic keratoses. It works by interfering with the growth of abnormal fast-growing skin cells, such as actinic keratoses. These cells peel off and are replaced by healthy ones.  ° °5-fluorouracil/calcipotriene is a combination of the 5-fluorouracil cream with a vitamin D analog cream called calcipotriene. The calcipotriene alone does not treat actinic keratoses. However, when it is combined with 5-fluorouracil, it helps the 5-fluorouracil treat the actinic keratoses much faster so that the same results can be achieved with a much shorter treatment time. ° °INSTRUCTIONS FOR 5-FLUOROURACIL/CALCIPOTRIENE CREAM:  ° °5-fluorouracil/calcipotriene cream typically only needs to be used for 4-7 days. A thin layer should be applied twice a day to the treatment areas recommended by your physician.  ° °If your physician prescribed you separate tubes of 5-fluourouracil and calcipotriene, apply a thin layer of 5-fluorouracil followed by a thin layer of calcipotriene.  ° °Avoid contact with your eyes, nostrils, and mouth. Do not use 5-fluorouracil/calcipotriene cream on infected or open wounds.  ° °You will develop redness, irritation and some crusting at areas where you have pre-cancer damage/actinic keratoses. IF YOU DEVELOP PAIN, BLEEDING, OR SIGNIFICANT CRUSTING, STOP THE TREATMENT EARLY - you have already gotten a good response and the actinic keratoses should clear up  well. ° °Wash your hands after applying 5-fluorouracil 5% cream on your skin.  ° °A moisturizer or sunscreen with a minimum SPF 30 should be applied each morning.  ° °Once you have finished the treatment, you can apply a thin layer of Vaseline twice a day to irritated areas to soothe and calm the areas more quickly. If you experience significant discomfort, contact your physician. ° °For some patients it is necessary to repeat the treatment for best results. ° °SIDE EFFECTS: When using 5-fluorouracil/calcipotriene cream, you may have mild irritation, such as redness, dryness, swelling, or a mild burning sensation. This usually resolves within 2 weeks. The more actinic keratoses you have, the more redness and inflammation you can expect during treatment. Eye irritation has been reported rarely. If this occurs, please let us know.  °If you have any trouble using this cream, please call the office. If you have any other questions about this information, please do not hesitate to ask me before you leave the office. °

## 2019-12-13 NOTE — Progress Notes (Signed)
° °  Follow-Up Visit   Subjective  Tamara Fleming is a 74 y.o. female who presents for the following: Skin Cancer (Biopsy proven Superficial BCC at L Chin 10-30-2019) and Actinic Keratosis (check scaly patch at the L nose ).  The following portions of the chart were reviewed this encounter and updated as appropriate:  Tobacco   Allergies   Meds   Problems   Med Hx   Surg Hx   Fam Hx      Review of Systems:  No other skin or systemic complaints except as noted in HPI or Assessment and Plan.  Objective  Well appearing patient in no apparent distress; mood and affect are within normal limits.  A focused examination was performed including face. Relevant physical exam findings are noted in the Assessment and Plan.  Objective  L chin: Pink pearly papule or plaque with arborizing vessels.   Objective  L nose: Erythematous thin papules/macules with gritty scale.    Assessment & Plan  Superficial basal cell carcinoma L chin Biopsy proven Superficial BCC   Start 5FU/Calcipotriene cream apply to chin area bid x 2 weeks  AK (actinic keratosis) L nose Start 5FU/Calcipotriene cream apply to chin area bid x 2 weeks  Actinic Damage - diffuse scaly erythematous macules with underlying dyspigmentation - Recommend daily broad spectrum sunscreen SPF 30+ to sun-exposed areas, reapply every 2 hours as needed.  - Call for new or changing lesions.  Return in about 6 weeks (around 01/24/2020).  IMarye Round, CMA, am acting as scribe for Sarina Ser, MD .  Documentation: I have reviewed the above documentation for accuracy and completeness, and I agree with the above.  Sarina Ser, MD

## 2019-12-28 ENCOUNTER — Other Ambulatory Visit: Payer: Self-pay

## 2019-12-28 ENCOUNTER — Encounter: Payer: Self-pay | Admitting: Family Medicine

## 2019-12-28 ENCOUNTER — Ambulatory Visit (INDEPENDENT_AMBULATORY_CARE_PROVIDER_SITE_OTHER): Payer: Medicare Other | Admitting: Family Medicine

## 2019-12-28 VITALS — BP 146/80 | HR 64 | Temp 98.5°F | Ht 64.0 in | Wt 188.0 lb

## 2019-12-28 DIAGNOSIS — M79671 Pain in right foot: Secondary | ICD-10-CM | POA: Diagnosis not present

## 2019-12-28 DIAGNOSIS — E538 Deficiency of other specified B group vitamins: Secondary | ICD-10-CM

## 2019-12-28 DIAGNOSIS — M79672 Pain in left foot: Secondary | ICD-10-CM

## 2019-12-28 DIAGNOSIS — R7989 Other specified abnormal findings of blood chemistry: Secondary | ICD-10-CM | POA: Diagnosis not present

## 2019-12-28 DIAGNOSIS — R7301 Impaired fasting glucose: Secondary | ICD-10-CM

## 2019-12-28 DIAGNOSIS — M1A9XX0 Chronic gout, unspecified, without tophus (tophi): Secondary | ICD-10-CM

## 2019-12-28 DIAGNOSIS — E7800 Pure hypercholesterolemia, unspecified: Secondary | ICD-10-CM

## 2019-12-28 DIAGNOSIS — F419 Anxiety disorder, unspecified: Secondary | ICD-10-CM | POA: Diagnosis not present

## 2019-12-28 DIAGNOSIS — E559 Vitamin D deficiency, unspecified: Secondary | ICD-10-CM

## 2019-12-28 DIAGNOSIS — I10 Essential (primary) hypertension: Secondary | ICD-10-CM

## 2019-12-28 LAB — URINALYSIS, ROUTINE W REFLEX MICROSCOPIC
Bilirubin, UA: NEGATIVE
Glucose, UA: NEGATIVE
Ketones, UA: NEGATIVE
Nitrite, UA: NEGATIVE
Protein,UA: NEGATIVE
Specific Gravity, UA: 1.015 (ref 1.005–1.030)
Urobilinogen, Ur: 0.2 mg/dL (ref 0.2–1.0)
pH, UA: 6 (ref 5.0–7.5)

## 2019-12-28 LAB — MICROSCOPIC EXAMINATION

## 2019-12-28 LAB — MICROALBUMIN, URINE WAIVED
Creatinine, Urine Waived: 100 mg/dL (ref 10–300)
Microalb, Ur Waived: 30 mg/L — ABNORMAL HIGH (ref 0–19)
Microalb/Creat Ratio: <30 mg/g (ref <30)

## 2019-12-28 LAB — BAYER DCA HB A1C WAIVED: HB A1C (BAYER DCA - WAIVED): 5.8 % (ref <7.0)

## 2019-12-28 MED ORDER — LOSARTAN POTASSIUM 50 MG PO TABS
50.0000 mg | ORAL_TABLET | Freq: Every day | ORAL | 1 refills | Status: DC
Start: 2019-12-28 — End: 2020-06-10

## 2019-12-28 MED ORDER — HYDROCHLOROTHIAZIDE 12.5 MG PO CAPS: 12.5000 mg | ORAL_CAPSULE | Freq: Every day | ORAL | 1 refills | Status: DC

## 2019-12-28 MED ORDER — ATORVASTATIN CALCIUM 10 MG PO TABS
10.0000 mg | ORAL_TABLET | Freq: Every day | ORAL | 1 refills | Status: DC
Start: 2019-12-28 — End: 2020-06-28

## 2019-12-28 NOTE — Progress Notes (Signed)
BP (!) 146/80 (BP Location: Left Arm, Cuff Size: Normal)   Pulse 64   Temp 98.5 F (36.9 C) (Oral)   Ht 5\' 4"  (1.626 m)   Wt 188 lb (85.3 kg)   LMP  (LMP Unknown)   SpO2 98%   BMI 32.27 kg/m    Subjective:    Patient ID: Tamara Fleming, female    DOB: 1945-04-16, 74 y.o.   MRN: [id number]  HPI: Tamara Fleming is a 73 y.o. female presenting on 12/28/2019 for comprehensive medical examination. Current medical complaints include:  Had a gout flare, has only had 1 in several years. Notes that her ankles swell off the the HCTZ. Would like to go back on it.   HYPERTENSION / HYPERLIPIDEMIA Satisfied with current treatment? no Duration of hypertension: chronic BP monitoring frequency: not checking BP medication side effects: no Past BP meds: losartan, HCTZ Duration of hyperlipidemia: chronic Cholesterol medication side effects: no Cholesterol supplements: none Past cholesterol medications: atorvastatin Medication compliance: excellent compliance Aspirin: yes Recent stressors: no Recurrent headaches: no Visual changes: no Palpitations: no Dyspnea: yes Chest pain: no Lower extremity edema: yes Dizzy/lightheaded: yes  HYPOTHYROIDISM Thyroid control status:controlled Satisfied with current treatment? yes Medication side effects: not on anything Fatigue: yes Cold intolerance: no Heat intolerance: no Weight gain: yes Weight loss: no Constipation: no Diarrhea/loose stools: no Palpitations: no Lower extremity edema: yes Anxiety/depressed mood: yes  Menopausal Symptoms: no  Depression Screen done today and results listed below:  Depression screen [facility] 2/9 12/28/2019 06/01/2019 12/20/2017 11/17/2017 07/13/2017  Decreased Interest 0 0 0 0 0  Down, Depressed, Hopeless 0 0 0 0 0  PHQ - 2 Score 0 0 0 0 0  Altered sleeping 1 - 2 1 1   Tired, decreased energy 1 - 2 1 0  Change in appetite 0 - 0 0 0  Feeling bad or failure about yourself  0 - 0 0 0  Trouble concentrating 0 -  0 0 0  Moving slowly or fidgety/restless 0 - 0 0 0  Suicidal thoughts 0 - 0 0 0  PHQ-9 Score 2 - 4 2 1   Difficult doing work/chores Not difficult at all - Not difficult at all Not difficult at all -    Past Medical History:  Past Medical History:  Diagnosis Date  . Arthritis of left hip 02/17/2017  . Basal cell carcinoma    L chin  . Carpal tunnel syndrome 05/09/2014   Right  . Chest pain   . Chest pain with high risk for cardiac etiology 10/20/2010  . Chronic low back pain   . Complication of anesthesia    small mouth  . Diverticulitis 02/17/2017  . Fatigue   . GERD (gastroesophageal reflux disease)    occ  . Gout   . Heart murmur   . Hiatal hernia   . Hip pain    Left  . Hyperlipidemia   . Hypertension   . Low back pain (secondary) bilateral (right greater than left) 09/30/2016  . OA (osteoarthritis)   . Peripheral vascular disease ([place])    plhebitis 20 yrs ago  . Piriformis syndrome of right side 02/17/2017  . Primary osteoarthritis of first carpometacarpal joint of left hand 09/10/2016  . Primary osteoarthritis of left hip 03/23/2017    Surgical History:  Past Surgical History:  Procedure Laterality Date  . ABDOMINAL HYSTERECTOMY  1982   Partial  . APPENDECTOMY    . ARM SURGERY Left    OF THE ULNA  NERVE  . BREAST EXCISIONAL BIOPSY Right   . CARDIOVASCULAR STRESS TEST  08/11/2006   NORMAL. EF 55-60%  . CARDIOVASCULAR STRESS TEST  10/31/1998   EF 60% AT REST TO 75-80% POST EXERCISE  . CARPAL TUNNEL RELEASE Right 2015  . ESOPHAGOGASTRODUODENOSCOPY  05/16/1990  . ESOPHAGOGASTRODUODENOSCOPY (EGD) WITH PROPOFOL N/A 02/04/2015   Procedure: ESOPHAGOGASTRODUODENOSCOPY (EGD) WITH PROPOFOL;  Surgeon: Hulen Luster, MD;  Location: [facility] ENDOSCOPY;  Service: Gastroenterology;  Laterality: N/A;  . JOINT REPLACEMENT    . REDUCTION MAMMAPLASTY  06/11/15  . TOTAL HIP ARTHROPLASTY Left 04/13/2017   Procedure: TOTAL LEFT HIP ARTHROPLASTY ANTERIOR APPROACH;  Surgeon: Renette Butters, MD;   Location: [facility];  Service: Orthopedics;  Laterality: Left;    Medications:  Current Outpatient Medications on File Prior to Visit  Medication Sig  . aspirin EC 81 MG tablet Take 81 mg by mouth daily.  . clobetasol (TEMOVATE) 0.05 % external solution Apply 1 application topically 2 (two) times daily. Apply to affected areas on scalp daily for 2 weeks then decrease daily 5 times a week  . HYDROcodone-acetaminophen (NORCO/VICODIN) 5-325 MG tablet Take 1 tablet by mouth 3 (three) times daily as needed for moderate pain.   Current Facility-Administered Medications on File Prior to Visit  Medication  . cyanocobalamin ((VITAMIN B-12)) injection 1,000 mcg    Allergies:  Allergies  Allergen Reactions  . Ciprofloxacin Anaphylaxis    Facial, tongue and throat   . Floxin [Ofloxacin] Anaphylaxis    Facial, tongue and throat   . Sulfa Antibiotics Diarrhea and Nausea And Vomiting    Social History:  Social History   Socioeconomic History  . Marital status: Married    Spouse name: Not on file  . Number of children: Not on file  . Years of education: 52, 1 year college   . Highest education level: Some college, no degree  Occupational History  . Occupation: self employed   Tobacco Use  . Smoking status: Never Smoker  . Smokeless tobacco: Never Used  Vaping Use  . Vaping Use: Never used  Substance and Sexual Activity  . Alcohol use: Yes    Alcohol/week: 14.0 standard drinks    Types: 14 Glasses of wine per week    Comment: wine, 1-2 daily  . Drug use: No  . Sexual activity: Not Currently    Birth control/protection: None  Other Topics Concern  . Not on file  Social History Narrative   Self employed   Social Determinants of Health   Financial Resource Strain:   . Difficulty of Paying Living Expenses: Not on file  Food Insecurity:   . Worried About Charity fundraiser in the Last Year: Not on file  . Ran Out of Food in the Last Year: Not on file  Transportation Needs:   .  Lack of Transportation (Medical): Not on file  . Lack of Transportation (Non-Medical): Not on file  Physical Activity:   . Days of Exercise per Week: Not on file  . Minutes of Exercise per Session: Not on file  Stress:   . Feeling of Stress : Not on file  Social Connections:   . Frequency of Communication with Friends and Family: Not on file  . Frequency of Social Gatherings with Friends and Family: Not on file  . Attends Religious Services: Not on file  . Active Member of Clubs or Organizations: Not on file  . Attends Archivist Meetings: Not on file  . Marital Status: Not  on file  Intimate Partner Violence:   . Fear of Current or Ex-Partner: Not on file  . Emotionally Abused: Not on file  . Physically Abused: Not on file  . Sexually Abused: Not on file   Social History   Tobacco Use  Smoking Status Never Smoker  Smokeless Tobacco Never Used   Social History   Substance and Sexual Activity  Alcohol Use Yes  . Alcohol/week: 14.0 standard drinks  . Types: 14 Glasses of wine per week   Comment: wine, 1-2 daily    Family History:  Family History  Problem Relation Age of Onset  . Coronary artery disease Mother   . Cancer Father        Lung    Past medical history, surgical history, medications, allergies, family history and social history reviewed with patient today and changes made to appropriate areas of the chart.   Review of Systems  Constitutional: Negative.   HENT: Negative.   Eyes: Positive for visual disturbance. Negative for photophobia, pain, discharge, redness and itching.  Respiratory: Positive for shortness of breath. Negative for apnea, cough, choking, chest tightness, wheezing and stridor.   Cardiovascular: Negative.   Gastrointestinal: Negative.   Endocrine: Negative.   Genitourinary: Negative.        Incomplete bladder emptying   Musculoskeletal: Positive for arthralgias and myalgias. Negative for back pain, gait problem, joint swelling  and neck stiffness.  Skin: Negative.   Neurological: Negative.   Hematological: Negative.   Psychiatric/Behavioral: Negative.  All other ROS negative except what is listed above and in the HPI.      Objective:    BP (!) 146/80 (BP Location: Left Arm, Cuff Size: Normal)   Pulse 64   Temp 98.5 F (36.9 C) (Oral)   Ht 5\' 4"  (1.626 m)   Wt 188 lb (85.3 kg)   LMP  (LMP Unknown)   SpO2 98%   BMI 32.27 kg/m   Wt Readings from Last 3 Encounters:  12/28/19 188 lb (85.3 kg)  06/01/19 186 lb (84.4 kg)  12/20/18 181 lb (82.1 kg)    Physical Exam Vitals and nursing note reviewed.  Constitutional:      General: She is not in acute distress.    Appearance: Normal appearance. She is not ill-appearing, toxic-appearing or diaphoretic.  HENT:     Head: Normocephalic and atraumatic.     Right Ear: Tympanic membrane, ear canal and external ear normal. There is no impacted cerumen.     Left Ear: Tympanic membrane, ear canal and external ear normal. There is no impacted cerumen.     Nose: Nose normal. No congestion or rhinorrhea.     Mouth/Throat:     Mouth: Mucous membranes are moist.     Pharynx: Oropharynx is clear. No oropharyngeal exudate or posterior oropharyngeal erythema.  Eyes:     General: No scleral icterus.       Right eye: No discharge.        Left eye: No discharge.     Extraocular Movements: Extraocular movements intact.     Conjunctiva/sclera: Conjunctivae normal.     Pupils: Pupils are equal, round, and reactive to light.  Neck:     Vascular: No carotid bruit.  Cardiovascular:     Rate and Rhythm: Normal rate and regular rhythm.     Pulses: Normal pulses.     Heart sounds: No murmur heard.  No friction rub. No gallop.   Pulmonary:     Effort: Pulmonary effort is normal.  No respiratory distress.     Breath sounds: Normal breath sounds. No stridor. No wheezing, rhonchi or rales.  Chest:     Chest wall: No tenderness.  Abdominal:     General: Abdomen is flat. Bowel  sounds are normal. There is no distension.     Palpations: Abdomen is soft. There is no mass.     Tenderness: There is no abdominal tenderness. There is no right CVA tenderness, left CVA tenderness, guarding or rebound.     Hernia: No hernia is present.  Genitourinary:    Comments: Breast and pelvic exams deferred with shared decision making Musculoskeletal:        General: No swelling, tenderness, deformity or signs of injury.     Cervical back: Normal range of motion and neck supple. No rigidity. No muscular tenderness.     Right lower leg: No edema.     Left lower leg: No edema.  Lymphadenopathy:     Cervical: No cervical adenopathy.  Skin:    General: Skin is warm and dry.     Capillary Refill: Capillary refill takes less than 2 seconds.     Coloration: Skin is not jaundiced or pale.     Findings: No bruising, erythema, lesion or rash.  Neurological:     General: No focal deficit present.     Mental Status: She is alert and oriented to person, place, and time. Mental status is at baseline.     Cranial Nerves: No cranial nerve deficit.     Sensory: No sensory deficit.     Motor: No weakness.     Coordination: Coordination normal.     Gait: Gait normal.     Deep Tendon Reflexes: Reflexes normal.  Psychiatric:        Mood and Affect: Mood normal.        Behavior: Behavior normal.        Thought Content: Thought content normal.        Judgment: Judgment normal.     Results for orders placed or performed in visit on 12/28/19  Microscopic Examination   Urine  Result Value Ref Range   WBC, UA 6-10 (A) 0 - 5 /hpf   RBC 3-10 (A) 0 - 2 /hpf   Epithelial Cells (non renal) 0-10 0 - 10 /hpf   Bacteria, UA Few (A) None seen/Few  Microalbumin, Urine Waived  Result Value Ref Range   Microalb, Ur Waived 30 (H) 0 - 19 mg/L   Creatinine, Urine Waived 100 10 - 300 mg/dL   Microalb/Creat Ratio <30 <30 mg/g  Urinalysis, Routine w reflex microscopic  Result Value Ref Range   Specific  Gravity, UA 1.015 1.005 - 1.030   pH, UA 6.0 5.0 - 7.5   Color, UA Yellow Yellow   Appearance Ur Clear Clear   Leukocytes,UA 1+ (A) Negative   Protein,UA Negative Negative/Trace   Glucose, UA Negative Negative   Ketones, UA Negative Negative   RBC, UA Trace (A) Negative   Bilirubin, UA Negative Negative   Urobilinogen, Ur 0.2 0.2 - 1.0 mg/dL   Nitrite, UA Negative Negative   Microscopic Examination See below:   Bayer DCA Hb A1c Waived  Result Value Ref Range   HB A1C (BAYER DCA - WAIVED) 5.8 <7.0 %      Assessment & Plan:   Problem List Items Addressed This Visit      Cardiovascular and Mediastinum   Essential hypertension    Will restart her HCTZ due to swelling.  Advised her to avoid it if she is eating gout triggers. Recheck 3 months. Call with any concerns.        Relevant Medications   losartan (COZAAR) 50 MG tablet   atorvastatin (LIPITOR) 10 MG tablet   hydrochlorothiazide (MICROZIDE) 12.5 MG capsule   Other Relevant Orders   CBC with Differential/Platelet   Comprehensive metabolic panel   Microalbumin, Urine Waived (Completed)   TSH   Urinalysis, Routine w reflex microscopic (Completed)     Endocrine   IFG (impaired fasting glucose) - Primary    Rechecking labs today. Await results. Treat as needed.       Relevant Orders   CBC with Differential/Platelet   Comprehensive metabolic panel   Urinalysis, Routine w reflex microscopic (Completed)   Bayer DCA Hb A1c Waived (Completed)     Other   Pure hypercholesterolemia    Under good control on current regimen. Continue current regimen. Continue to monitor. Call with any concerns. Refills given. Labs drawn today.       Relevant Medications   losartan (COZAAR) 50 MG tablet   atorvastatin (LIPITOR) 10 MG tablet   hydrochlorothiazide (MICROZIDE) 12.5 MG capsule   Other Relevant Orders   CBC with Differential/Platelet   Comprehensive metabolic panel   Lipid Panel w/o Chol/HDL Ratio   B12 deficiency     Rechecking labs today. Await results. Treat as needed.        Relevant Orders   CBC with Differential/Platelet   Comprehensive metabolic panel   V67   Anxiety    Stable. Call with any concerns. Continue to monitor.       Relevant Orders   CBC with Differential/Platelet   Comprehensive metabolic panel   Vitamin D deficiency    Rechecking labs today. Await results. Treat as needed.       Relevant Orders   CBC with Differential/Platelet   Comprehensive metabolic panel   VITAMIN D 25 Hydroxy (Vit-D Deficiency, Fractures)   Elevated TSH    Rechecking labs today. Await results. Treat as needed.        Relevant Orders   CBC with Differential/Platelet   Comprehensive metabolic panel   TSH   Gout    Rechecking labs today. Await results. Treat as needed.        Relevant Orders   CBC with Differential/Platelet   Comprehensive metabolic panel   Uric acid    Other Visit Diagnoses    Heel pain, bilateral       Will treat with voltaren. If not getting better, let us know and we'll get x-ray       Follow up plan: Return in about 3 months (around 03/28/2020).   LABORATORY TESTING:  - Pap smear: not applicable  IMMUNIZATIONS:   - Tdap: Tetanus vaccination status reviewed: Declined. - Influenza: Refused - Pneumovax: Up to date - Prevnar: Up to date - COVID: Up to date  SCREENING: -Mammogram: Ordered today  - Colonoscopy: Up to date  - Bone Density: Up to date   PATIENT COUNSELING:   Advised to take 1 mg of folate supplement per day if capable of pregnancy.   Sexuality: Discussed sexually transmitted diseases, partner selection, use of condoms, avoidance of unintended pregnancy  and contraceptive alternatives.   Advised to avoid cigarette smoking.  I discussed with the patient that most people either abstain from alcohol or drink within safe limits (<=14/week and <=4 drinks/occasion for males, <=7/weeks and <= 3 drinks/occasion for females) and that the risk for  alcohol  disorders and other health effects rises proportionally with the number of drinks per week and how often a drinker exceeds daily limits.  Discussed cessation/primary prevention of drug use and availability of treatment for abuse.   Diet: Encouraged to adjust caloric intake to maintain  or achieve ideal body weight, to reduce intake of dietary saturated fat and total fat, to limit sodium intake by avoiding high sodium foods and not adding table salt, and to maintain adequate dietary potassium and calcium preferably from fresh fruits, vegetables, and low-fat dairy products.    stressed the importance of regular exercise  Injury prevention: Discussed safety belts, safety helmets, smoke detector, smoking near bedding or upholstery.   Dental health: Discussed importance of regular tooth brushing, flossing, and dental visits.    NEXT PREVENTATIVE PHYSICAL DUE IN 1 YEAR. Return in about 3 months (around 03/28/2020).

## 2019-12-28 NOTE — Assessment & Plan Note (Signed)
Rechecking labs today. Await results. Treat as needed.  °

## 2019-12-28 NOTE — Assessment & Plan Note (Signed)
Stable. Call with any concerns. Continue to monitor.

## 2019-12-28 NOTE — Assessment & Plan Note (Signed)
Under good control on current regimen. Continue current regimen. Continue to monitor. Call with any concerns. Refills given. Labs drawn today.   

## 2019-12-28 NOTE — Assessment & Plan Note (Addendum)
Will restart her HCTZ due to swelling. Advised her to avoid it if she is eating gout triggers. Recheck 3 months. Call with any concerns.

## 2019-12-30 LAB — CBC WITH DIFFERENTIAL/PLATELET
Basophils Absolute: 0 10*3/uL (ref 0.0–0.2)
Basos: 0 %
EOS (ABSOLUTE): 0.1 10*3/uL (ref 0.0–0.4)
Eos: 2 %
Hematocrit: 41.7 % (ref 34.0–46.6)
Hemoglobin: 14 g/dL (ref 11.1–15.9)
Immature Grans (Abs): 0 10*3/uL (ref 0.0–0.1)
Immature Granulocytes: 0 %
Lymphocytes Absolute: 1.3 10*3/uL (ref 0.7–3.1)
Lymphs: 25 %
MCH: 33.8 pg — ABNORMAL HIGH (ref 26.6–33.0)
MCHC: 33.6 g/dL (ref 31.5–35.7)
MCV: 101 fL — ABNORMAL HIGH (ref 79–97)
Monocytes Absolute: 0.6 10*3/uL (ref 0.1–0.9)
Monocytes: 12 %
Neutrophils Absolute: 3.1 10*3/uL (ref 1.4–7.0)
Neutrophils: 61 %
Platelets: 148 10*3/uL — ABNORMAL LOW (ref 150–450)
RBC: 4.14 x10E6/uL (ref 3.77–5.28)
RDW: 11.8 % (ref 11.7–15.4)
WBC: 5.1 10*3/uL (ref 3.4–10.8)

## 2019-12-30 LAB — COMPREHENSIVE METABOLIC PANEL
ALT: 56 IU/L — ABNORMAL HIGH (ref 0–32)
AST: 89 IU/L — ABNORMAL HIGH (ref 0–40)
Albumin/Globulin Ratio: 1.7 (ref 1.2–2.2)
Albumin: 4.5 g/dL (ref 3.7–4.7)
Alkaline Phosphatase: 95 IU/L (ref 44–121)
BUN/Creatinine Ratio: 14 (ref 12–28)
BUN: 11 mg/dL (ref 8–27)
Bilirubin Total: 0.6 mg/dL (ref 0.0–1.2)
CO2: 26 mmol/L (ref 20–29)
Calcium: 9.7 mg/dL (ref 8.7–10.3)
Chloride: 101 mmol/L (ref 96–106)
Creatinine, Ser: 0.8 mg/dL (ref 0.57–1.00)
GFR calc Af Amer: 85 mL/min/{1.73_m2} (ref >59)
GFR calc non Af Amer: 73 mL/min/{1.73_m2} (ref >59)
Globulin, Total: 2.6 g/dL (ref 1.5–4.5)
Glucose: 109 mg/dL — ABNORMAL HIGH (ref 65–99)
Potassium: 4.4 mmol/L (ref 3.5–5.2)
Sodium: 141 mmol/L (ref 134–144)
Total Protein: 7.1 g/dL (ref 6.0–8.5)

## 2019-12-30 LAB — LIPID PANEL W/O CHOL/HDL RATIO
Cholesterol, Total: 205 mg/dL — ABNORMAL HIGH (ref 100–199)
HDL: 52 mg/dL (ref >39)
LDL Chol Calc (NIH): 131 mg/dL — ABNORMAL HIGH (ref 0–99)
Triglycerides: 123 mg/dL (ref 0–149)
VLDL Cholesterol Cal: 22 mg/dL (ref 5–40)

## 2019-12-30 LAB — URIC ACID: Uric Acid: 6.2 mg/dL (ref 3.1–7.9)

## 2019-12-30 LAB — VITAMIN D 25 HYDROXY (VIT D DEFICIENCY, FRACTURES): Vit D, 25-Hydroxy: 27.8 ng/mL — ABNORMAL LOW (ref 30.0–100.0)

## 2019-12-30 LAB — VITAMIN B12: Vitamin B-12: 385 pg/mL (ref 232–1245)

## 2019-12-30 LAB — TSH: TSH: 2.86 u[IU]/mL (ref 0.450–4.500)

## 2019-12-31 ENCOUNTER — Encounter: Payer: Self-pay | Admitting: Family Medicine

## 2020-01-02 ENCOUNTER — Telehealth: Payer: Self-pay

## 2020-01-02 NOTE — Telephone Encounter (Signed)
Patient called in regarding lab results. She states that she thought her urine was going to be sent off for a culture. Do not see a culture order in the chart. Patient states that her symptoms are getting worse and that she is having a lot of lower abdominal pain. States she is having to get up 3 to 4 times per night to use the bathroom. States she is going out of town Thursday morning and would like something sent in for her.   Patient also asked about her platelet count being a little low and why it could be like that.

## 2020-01-03 DIAGNOSIS — M47814 Spondylosis without myelopathy or radiculopathy, thoracic region: Secondary | ICD-10-CM | POA: Diagnosis not present

## 2020-01-03 DIAGNOSIS — M47812 Spondylosis without myelopathy or radiculopathy, cervical region: Secondary | ICD-10-CM | POA: Diagnosis not present

## 2020-01-03 DIAGNOSIS — M549 Dorsalgia, unspecified: Secondary | ICD-10-CM | POA: Diagnosis not present

## 2020-01-03 NOTE — Telephone Encounter (Signed)
Pt's husband (DPR) wanted to check up on the urine culture results.

## 2020-01-04 ENCOUNTER — Other Ambulatory Visit: Payer: Self-pay | Admitting: Family Medicine

## 2020-01-04 MED ORDER — AMOXICILLIN 500 MG PO CAPS: 500.0000 mg | ORAL_CAPSULE | Freq: Two times a day (BID) | ORAL | 0 refills | Status: DC

## 2020-01-04 NOTE — Telephone Encounter (Signed)
Patient notified

## 2020-01-04 NOTE — Telephone Encounter (Signed)
THe lab didn't run a culture on her urine, but because she's still having the abdominal pain, I'm going to treat her with some antibiotics, which are at the pharmacy. Her platelet can be low for a variety of reasons including illness, but with them only being down 2 points, I would not worry about it. We can recheck it in a month if that will make her feel better.

## 2020-01-09 ENCOUNTER — Ambulatory Visit: Payer: Medicare Other | Admitting: Dermatology

## 2020-01-24 ENCOUNTER — Ambulatory Visit (INDEPENDENT_AMBULATORY_CARE_PROVIDER_SITE_OTHER): Payer: Medicare Other | Admitting: Dermatology

## 2020-01-24 ENCOUNTER — Other Ambulatory Visit: Payer: Self-pay

## 2020-01-24 DIAGNOSIS — L570 Actinic keratosis: Secondary | ICD-10-CM | POA: Diagnosis not present

## 2020-01-24 DIAGNOSIS — Z85828 Personal history of other malignant neoplasm of skin: Secondary | ICD-10-CM | POA: Diagnosis not present

## 2020-01-24 DIAGNOSIS — L578 Other skin changes due to chronic exposure to nonionizing radiation: Secondary | ICD-10-CM

## 2020-01-24 NOTE — Progress Notes (Signed)
   Follow-Up Visit   Subjective  Tamara Fleming is a 74 y.o. female who presents for the following: biopsy proven superficial BCC (patient used 5FU/Calcipotriene mix BID to L chin x 1.5 weeks).  The following portions of the chart were reviewed this encounter and updated as appropriate:  Tobacco  Allergies  Meds  Problems  Med Hx  Surg Hx  Fam Hx     Review of Systems:  No other skin or systemic complaints except as noted in HPI or Assessment and Plan.  Objective  Well appearing patient in no apparent distress; mood and affect are within normal limits.  A focused examination was performed including the face and ears. Relevant physical exam findings are noted in the Assessment and Plan.  Objective  B/L ear, L nose (2): Erythematous thin papules/macules with gritty scale.   Objective  L chin: Clear today.   Assessment & Plan  AK (actinic keratosis) (2) B/L ear, L nose  Start 5FU/Calcipotriene mix BID x 1 week to the L nose.   Destruction of lesion - B/L ear, L nose Complexity: simple   Destruction method: cryotherapy   Informed consent: discussed and consent obtained   Timeout:  patient name, date of birth, surgical site, and procedure verified Lesion destroyed using liquid nitrogen: Yes   Region frozen until ice ball extended beyond lesion: Yes   Outcome: patient tolerated procedure well with no complications   Post-procedure details: wound care instructions given    History of basal cell carcinoma (BCC) of skin L chin  Treated with ED&C on 11/14/19, S/P Calcipotriene/5FU mix BID x 1.5 weeks 3 weeks ago. Significant reaction shown on photos from patient's phone.  Actinic Damage - diffuse scaly erythematous macules with underlying dyspigmentation - Recommend daily broad spectrum sunscreen SPF 30+ to sun-exposed areas, reapply every 2 hours as needed.  - Call for new or changing lesions.  Return for appointment as scheduled, [facility].  Luther Redo, CMA, am  acting as scribe for Sarina Ser, MD .  Documentation: I have reviewed the above documentation for accuracy and completeness, and I agree with the above.  Sarina Ser, MD

## 2020-01-29 ENCOUNTER — Encounter: Payer: Self-pay | Admitting: Dermatology

## 2020-01-29 ENCOUNTER — Other Ambulatory Visit: Payer: Self-pay

## 2020-01-29 ENCOUNTER — Ambulatory Visit (INDEPENDENT_AMBULATORY_CARE_PROVIDER_SITE_OTHER): Payer: Medicare Other

## 2020-01-29 DIAGNOSIS — Z23 Encounter for immunization: Secondary | ICD-10-CM | POA: Diagnosis not present

## 2020-02-27 DIAGNOSIS — M542 Cervicalgia: Secondary | ICD-10-CM | POA: Diagnosis not present

## 2020-02-27 DIAGNOSIS — M47816 Spondylosis without myelopathy or radiculopathy, lumbar region: Secondary | ICD-10-CM | POA: Diagnosis not present

## 2020-02-27 DIAGNOSIS — Z79891 Long term (current) use of opiate analgesic: Secondary | ICD-10-CM | POA: Diagnosis not present

## 2020-02-27 DIAGNOSIS — G894 Chronic pain syndrome: Secondary | ICD-10-CM | POA: Diagnosis not present

## 2020-02-27 DIAGNOSIS — M549 Dorsalgia, unspecified: Secondary | ICD-10-CM | POA: Diagnosis not present

## 2020-02-27 DIAGNOSIS — Z79899 Other long term (current) drug therapy: Secondary | ICD-10-CM | POA: Diagnosis not present

## 2020-03-12 DIAGNOSIS — R600 Localized edema: Secondary | ICD-10-CM | POA: Diagnosis not present

## 2020-03-12 DIAGNOSIS — R002 Palpitations: Secondary | ICD-10-CM | POA: Diagnosis not present

## 2020-03-12 DIAGNOSIS — I10 Essential (primary) hypertension: Secondary | ICD-10-CM | POA: Diagnosis not present

## 2020-03-12 DIAGNOSIS — E7800 Pure hypercholesterolemia, unspecified: Secondary | ICD-10-CM | POA: Diagnosis not present

## 2020-03-12 DIAGNOSIS — E785 Hyperlipidemia, unspecified: Secondary | ICD-10-CM | POA: Diagnosis not present

## 2020-03-14 ENCOUNTER — Encounter: Payer: Medicare Other | Admitting: Dermatology

## 2020-04-02 DIAGNOSIS — Z23 Encounter for immunization: Secondary | ICD-10-CM | POA: Diagnosis not present

## 2020-04-11 ENCOUNTER — Ambulatory Visit: Payer: Medicare Other | Admitting: Family Medicine

## 2020-04-22 ENCOUNTER — Ambulatory Visit: Payer: Medicare Other | Admitting: Family Medicine

## 2020-04-25 DIAGNOSIS — M47816 Spondylosis without myelopathy or radiculopathy, lumbar region: Secondary | ICD-10-CM | POA: Diagnosis not present

## 2020-04-25 DIAGNOSIS — G894 Chronic pain syndrome: Secondary | ICD-10-CM | POA: Diagnosis not present

## 2020-04-25 DIAGNOSIS — Z79899 Other long term (current) drug therapy: Secondary | ICD-10-CM | POA: Diagnosis not present

## 2020-04-25 DIAGNOSIS — Z79891 Long term (current) use of opiate analgesic: Secondary | ICD-10-CM | POA: Diagnosis not present

## 2020-04-25 DIAGNOSIS — M542 Cervicalgia: Secondary | ICD-10-CM | POA: Diagnosis not present

## 2020-04-25 DIAGNOSIS — M549 Dorsalgia, unspecified: Secondary | ICD-10-CM | POA: Diagnosis not present

## 2020-04-26 ENCOUNTER — Ambulatory Visit: Payer: Medicare Other | Admitting: Family Medicine

## 2020-05-24 DIAGNOSIS — M47817 Spondylosis without myelopathy or radiculopathy, lumbosacral region: Secondary | ICD-10-CM | POA: Diagnosis not present

## 2020-05-24 DIAGNOSIS — M47814 Spondylosis without myelopathy or radiculopathy, thoracic region: Secondary | ICD-10-CM | POA: Diagnosis not present

## 2020-05-24 DIAGNOSIS — M47812 Spondylosis without myelopathy or radiculopathy, cervical region: Secondary | ICD-10-CM | POA: Diagnosis not present

## 2020-05-24 DIAGNOSIS — M545 Low back pain, unspecified: Secondary | ICD-10-CM | POA: Diagnosis not present

## 2020-06-10 ENCOUNTER — Other Ambulatory Visit: Payer: Self-pay | Admitting: Internal Medicine

## 2020-06-10 MED ORDER — LOSARTAN POTASSIUM 50 MG PO TABS
50.0000 mg | ORAL_TABLET | Freq: Every day | ORAL | 0 refills | Status: DC
Start: 2020-06-10 — End: 2020-07-01

## 2020-06-10 NOTE — Telephone Encounter (Signed)
Copied from [place] [phone]. Topic: Quick Communication - Rx Refill/Question >> Jun 10, 2020  3:53 PM Leward Quan A wrote: Medication: losartan (COZAAR) 50 MG tablet  Completely out took the last one today   Has the patient contacted their pharmacy? Yes.   (Agent: If no, request that the patient contact the pharmacy for the refill.) (Agent: If yes, when and what did the pharmacy advise?)  Preferred Pharmacy (with phone number or street name): [facility] - [place], [place].  Phone:  [phone] Fax:  [phone]     Agent: Please be advised that RX refills may take up to 3 business days. We ask that you follow-up with your pharmacy.

## 2020-06-20 ENCOUNTER — Telehealth: Payer: Self-pay | Admitting: Internal Medicine

## 2020-06-20 NOTE — Telephone Encounter (Signed)
Copied from [place] [id number]. Topic: Medicare AWV >> Jun 20, 2020  1:17 PM Cher Nakai R wrote: Reason for CRM:  Left message for patient to call back and schedule the Medicare Annual Wellness Visit (AWV) virtually or by telephone.  Last AWV 06/01/2019  Please schedule at anytime with [facility]Nurse Health Advisor.  45 minute appointment  Any questions, please call me at [phone]

## 2020-06-21 DIAGNOSIS — Z79899 Other long term (current) drug therapy: Secondary | ICD-10-CM | POA: Diagnosis not present

## 2020-06-21 DIAGNOSIS — M47817 Spondylosis without myelopathy or radiculopathy, lumbosacral region: Secondary | ICD-10-CM | POA: Diagnosis not present

## 2020-06-21 DIAGNOSIS — M47812 Spondylosis without myelopathy or radiculopathy, cervical region: Secondary | ICD-10-CM | POA: Diagnosis not present

## 2020-06-21 DIAGNOSIS — M47814 Spondylosis without myelopathy or radiculopathy, thoracic region: Secondary | ICD-10-CM | POA: Diagnosis not present

## 2020-06-21 DIAGNOSIS — G894 Chronic pain syndrome: Secondary | ICD-10-CM | POA: Diagnosis not present

## 2020-06-21 DIAGNOSIS — Z79891 Long term (current) use of opiate analgesic: Secondary | ICD-10-CM | POA: Diagnosis not present

## 2020-06-25 DIAGNOSIS — I700 Atherosclerosis of aorta: Secondary | ICD-10-CM | POA: Insufficient documentation

## 2020-06-28 ENCOUNTER — Telehealth: Payer: Self-pay

## 2020-06-28 ENCOUNTER — Other Ambulatory Visit: Payer: Self-pay | Admitting: Internal Medicine

## 2020-06-28 NOTE — Telephone Encounter (Signed)
Patient has an appointment 07/01/20

## 2020-06-28 NOTE — Telephone Encounter (Signed)
Pt verbalized understanding.

## 2020-06-28 NOTE — Telephone Encounter (Signed)
Called Tamara Fleming to confirm apt with Dr.Vigg Tamara Fleming wanted to ask if Dr.Johson would keep her as her Tamara Fleming instead, Tamara Fleming aware of why she now has a new PCP but she would like to keep seeing Dr.Johnson. Please advise.

## 2020-06-28 NOTE — Telephone Encounter (Signed)
Yes I will

## 2020-06-28 NOTE — Telephone Encounter (Signed)
Notes to clinic: Patient due for appointment on 07/01/2020 Review for refill until that time   Requested Prescriptions  Pending Prescriptions Disp Refills   atorvastatin (LIPITOR) 10 MG tablet 90 tablet 1    Sig: Take 1 tablet (10 mg total) by mouth at bedtime.      Cardiovascular:  Antilipid - Statins Failed - 06/28/2020 11:50 AM      Failed - Total Cholesterol in normal range and within 360 days    Cholesterol, Total  Date Value Ref Range Status  12/28/2019 205 (H) 100 - 199 mg/dL Final   Cholesterol  Date Value Ref Range Status  07/15/2012 109 0 - 200 mg/dL Final   Cholesterol Piccolo, Waived  Date Value Ref Range Status  05/15/2015 189 <200 mg/dL Final    Comment:                            Desirable                <200                         Borderline High      200- 239                         High                     >239           Failed - LDL in normal range and within 360 days    Ldl Cholesterol, Calc  Date Value Ref Range Status  07/15/2012 51 0 - 100 mg/dL Final   LDL Chol Calc (NIH)  Date Value Ref Range Status  12/28/2019 131 (H) 0 - 99 mg/dL Final   Direct LDL  Date Value Ref Range Status  04/22/2011 125.0 mg/dL Final    Comment:    Optimal:  <100 mg/dLNear or Above Optimal:  100-129 mg/dLBorderline High:  130-159 mg/dLHigh:  160-189 mg/dLVery High:  >190 mg/dL          Passed - HDL in normal range and within 360 days    HDL Cholesterol  Date Value Ref Range Status  07/15/2012 35 (L) 40 - 60 mg/dL Final   HDL  Date Value Ref Range Status  12/28/2019 52 >39 mg/dL Final          Passed - Triglycerides in normal range and within 360 days    Triglycerides  Date Value Ref Range Status  12/28/2019 123 0 - 149 mg/dL Final  07/15/2012 116 0 - 200 mg/dL Final   Triglycerides Piccolo,Waived  Date Value Ref Range Status  05/15/2015 157 (H) <150 mg/dL Final    Comment:                            Normal                   <150                          Borderline High     150 - 199                         High  200 - 68                         Very High                >499           Passed - Patient is not pregnant      Passed - Valid encounter within last 12 months    Recent Outpatient Visits           6 months ago IFG (impaired fasting glucose)   [facility] [place], Megan P, DO   1 year ago IFG (impaired fasting glucose)   [facility] [place], Megan P, DO   1 year ago Acute idiopathic gout of left foot   [place], Barbaraann Faster, NP   1 year ago Essential hypertension   [facility] [place], [place], DO   2 years ago Chronic pain syndrome   [facility] [place], Megan P, DO       Future Appointments             In 3 days Vigg, Avanti, MD [facility]               hydrochlorothiazide (MICROZIDE) 12.5 MG capsule 90 capsule 1    Sig: Take 1 capsule (12.5 mg total) by mouth daily.      Cardiovascular: Diuretics - Thiazide Failed - 06/28/2020 11:50 AM      Failed - Last BP in normal range    BP Readings from Last 1 Encounters:  12/28/19 (!) 146/80          Failed - Valid encounter within last 6 months    Recent Outpatient Visits           6 months ago IFG (impaired fasting glucose)   [facility], Megan P, DO   1 year ago IFG (impaired fasting glucose)   [facility] [place], Megan P, DO   1 year ago Acute idiopathic gout of left foot   [place], [place] T, NP   1 year ago Essential hypertension   [facility] [place], [place], DO   2 years ago Chronic pain syndrome   [place], Megan P, DO       Future Appointments             In 3 days Vigg, Avanti, MD [facility]             Passed - Ca in normal range and within 360 days    Calcium  Date Value Ref Range Status  12/28/2019 9.7  8.7 - 10.3 mg/dL Final   Calcium, Total  Date Value Ref Range Status  09/05/2012 9.0 8.5 - 10.1 mg/dL Final          Passed - Cr in normal range and within 360 days    Creatinine  Date Value Ref Range Status  09/05/2012 0.74 0.60 - 1.30 mg/dL Final   Creat  Date Value Ref Range Status  02/26/2012 0.75 0.50 - 1.10 mg/dL Final   Creatinine, Ser  Date Value Ref Range Status  12/28/2019 0.80 0.57 - 1.00 mg/dL Final          Passed - K in normal range and within 360 days    Potassium  Date Value Ref Range Status  12/28/2019 4.4 3.5 - 5.2 mmol/L Final  09/05/2012 3.9 3.5 - 5.1 mmol/L Final          Passed - Na in normal range and within 360 days    Sodium  Date Value Ref Range Status  12/28/2019 141 134 - 144 mmol/L Final  09/05/2012 139 136 - 145 mmol/L Final

## 2020-06-28 NOTE — Telephone Encounter (Signed)
Copied from [place] [id number]. Topic: Quick Communication - Rx Refill/Question >> Jun 28, 2020 11:43 AM Mcneil, Ja-Kwan wrote: Medication: atorvastatin (LIPITOR) 10 MG tablet and hydrochlorothiazide (MICROZIDE) 12.5 MG capsule  Has the patient contacted their pharmacy? yes - Pt told to call provider office  Preferred Pharmacy (with phone number or street name): [facility] - [place], [place].  Phone: [phone]   Fax: [phone]  Agent: Please be advised that RX refills may take up to 3 business days. We ask that you follow-up with your pharmacy.

## 2020-07-01 ENCOUNTER — Other Ambulatory Visit: Payer: Self-pay

## 2020-07-01 ENCOUNTER — Ambulatory Visit (INDEPENDENT_AMBULATORY_CARE_PROVIDER_SITE_OTHER): Payer: Medicare Other | Admitting: Internal Medicine

## 2020-07-01 ENCOUNTER — Encounter: Payer: Self-pay | Admitting: Internal Medicine

## 2020-07-01 VITALS — BP 160/80 | HR 62 | Temp 98.8°F | Ht 64.41 in | Wt 187.4 lb

## 2020-07-01 DIAGNOSIS — M1A9XX0 Chronic gout, unspecified, without tophus (tophi): Secondary | ICD-10-CM | POA: Diagnosis not present

## 2020-07-01 DIAGNOSIS — I10 Essential (primary) hypertension: Secondary | ICD-10-CM

## 2020-07-01 MED ORDER — ATORVASTATIN CALCIUM 10 MG PO TABS
10.0000 mg | ORAL_TABLET | Freq: Every day | ORAL | 1 refills | Status: DC
Start: 2020-07-01 — End: 2020-09-11

## 2020-07-01 MED ORDER — SPIRONOLACTONE 25 MG PO TABS: 12.5000 mg | ORAL_TABLET | Freq: Every day | ORAL | 3 refills | Status: DC

## 2020-07-01 MED ORDER — HYDROCHLOROTHIAZIDE 12.5 MG PO CAPS: 12.5000 mg | ORAL_CAPSULE | Freq: Every day | ORAL | 1 refills | Status: DC

## 2020-07-01 MED ORDER — OMEPRAZOLE MAGNESIUM 20 MG PO TBEC: 20.0000 mg | DELAYED_RELEASE_TABLET | Freq: Every day | ORAL | 4 refills | Status: DC

## 2020-07-01 MED ORDER — LOSARTAN POTASSIUM 50 MG PO TABS: 50.0000 mg | ORAL_TABLET | Freq: Every day | ORAL | 0 refills | Status: DC

## 2020-07-01 NOTE — Progress Notes (Signed)
BP (!) 160/80   Pulse 62   Temp 98.8 F (37.1 C) (Oral)   Ht 5' 4.41" (1.636 m)   Wt 187 lb 6.4 oz (85 kg)   LMP  (LMP Unknown)   SpO2 97%   BMI 31.76 kg/m    Subjective:    Patient ID: Tamara Fleming, female    DOB: 01/02/1946, 75 y.o.   MRN: [id number]  HPI: Tamara APPELHANS is a 75 y.o. female  Pt is here to establish care. Has had low Pulse rate per pt around the 50's this weke.  Shrimp and crabnmeat worsens gout   Medication Refill Pertinent negatives include no abdominal pain, arthralgias, chest pain, chills, congestion, coughing, fatigue, fever, headaches, joint swelling, myalgias, nausea, neck pain, numbness, rash, vomiting or weakness.  Hypertension This is a chronic (hasnt taken hctz x 4 days is only on losartan for such ) problem. The current episode started more than 1 year ago. The problem is controlled. Pertinent negatives include no anxiety, blurred vision, chest pain, headaches, malaise/fatigue, neck pain, orthopnea, palpitations, peripheral edema, shortness of breath or sweats.  Arthritis Presents for follow-up visit. She reports no joint swelling. Pertinent negatives include no diarrhea, dysuria, fatigue, fever or rash.    Chief Complaint  Patient presents with  . Medication Refill    Relevant past medical, surgical, family and social history reviewed and updated as indicated. Interim medical history since our last visit reviewed. Allergies and medications reviewed and updated.  Review of Systems  Constitutional: Negative for activity change, appetite change, chills, fatigue, fever and malaise/fatigue.  HENT: Negative for congestion, ear discharge, ear pain and facial swelling.   Eyes: Negative for blurred vision, pain and itching.  Respiratory: Negative for cough, chest tightness, shortness of breath and wheezing.   Cardiovascular: Negative for chest pain, palpitations, orthopnea and leg swelling.  Gastrointestinal: Negative for abdominal distention,  abdominal pain, blood in stool, constipation, diarrhea, nausea and vomiting.  Endocrine: Negative for cold intolerance, heat intolerance, polydipsia, polyphagia and polyuria.  Genitourinary: Negative for difficulty urinating, dysuria, flank pain, frequency, hematuria and urgency.  Musculoskeletal: Positive for arthritis. Negative for arthralgias, gait problem, joint swelling, myalgias and neck pain.  Skin: Negative for color change, rash and wound.  Neurological: Negative for dizziness, tremors, speech difficulty, weakness, light-headedness, numbness and headaches.  Hematological: Does not bruise/bleed easily.  Psychiatric/Behavioral: Negative for agitation, confusion, decreased concentration, sleep disturbance and suicidal ideas.    Per HPI unless specifically indicated above     Objective:    BP (!) 160/80   Pulse 62   Temp 98.8 F (37.1 C) (Oral)   Ht 5' 4.41" (1.636 m)   Wt 187 lb 6.4 oz (85 kg)   LMP  (LMP Unknown)   SpO2 97%   BMI 31.76 kg/m   Wt Readings from Last 3 Encounters:  07/01/20 187 lb 6.4 oz (85 kg)  12/28/19 188 lb (85.3 kg)  06/01/19 186 lb (84.4 kg)    Physical Exam Vitals and nursing note reviewed.  Constitutional:      General: She is not in acute distress.    Appearance: Normal appearance. She is not ill-appearing or diaphoretic.  HENT:     Head: Normocephalic and atraumatic.     Right Ear: Tympanic membrane and external ear normal. There is no impacted cerumen.     Left Ear: External ear normal.     Nose: No congestion or rhinorrhea.     Mouth/Throat:     Pharynx: No  oropharyngeal exudate or posterior oropharyngeal erythema.  Eyes:     Conjunctiva/sclera: Conjunctivae normal.     Pupils: Pupils are equal, round, and reactive to light.  Cardiovascular:     Rate and Rhythm: Normal rate and regular rhythm.     Heart sounds: No murmur heard. No friction rub. No gallop.   Pulmonary:     Effort: No respiratory distress.     Breath sounds: No  stridor. No wheezing or rhonchi.  Chest:     Chest wall: No tenderness.  Abdominal:     General: Abdomen is flat. Bowel sounds are normal. There is no distension.     Palpations: Abdomen is soft. There is no mass.     Tenderness: There is no abdominal tenderness. There is no guarding.  Musculoskeletal:        General: No swelling or deformity.     Cervical back: Normal range of motion and neck supple. No rigidity or tenderness.     Right lower leg: No edema.     Left lower leg: No edema.  Skin:    General: Skin is warm and dry.     Coloration: Skin is not jaundiced.     Findings: No erythema.  Neurological:     Mental Status: She is alert and oriented to person, place, and time. Mental status is at baseline.  Psychiatric:        Mood and Affect: Mood normal.        Behavior: Behavior normal.        Thought Content: Thought content normal.        Judgment: Judgment normal.     Results for orders placed or performed in visit on 12/28/19  Microscopic Examination   Urine  Result Value Ref Range   WBC, UA 6-10 (A) 0 - 5 /hpf   RBC 3-10 (A) 0 - 2 /hpf   Epithelial Cells (non renal) 0-10 0 - 10 /hpf   Bacteria, UA Few (A) None seen/Few  CBC with Differential/Platelet  Result Value Ref Range   WBC 5.1 3.4 - 10.8 x10E3/uL   RBC 4.14 3.77 - 5.28 x10E6/uL   Hemoglobin 14.0 11.1 - 15.9 g/dL   Hematocrit 41.7 34.0 - 46.6 %   MCV 101 (H) 79 - 97 fL   MCH 33.8 (H) 26.6 - 33.0 pg   MCHC 33.6 31.5 - 35.7 g/dL   RDW 11.8 11.7 - 15.4 %   Platelets 148 (L) 150 - 450 x10E3/uL   Neutrophils 61 Not Estab. %   Lymphs 25 Not Estab. %   Monocytes 12 Not Estab. %   Eos 2 Not Estab. %   Basos 0 Not Estab. %   Neutrophils Absolute 3.1 1.4 - 7.0 x10E3/uL   Lymphocytes Absolute 1.3 0.7 - 3.1 x10E3/uL   Monocytes Absolute 0.6 0.1 - 0.9 x10E3/uL   EOS (ABSOLUTE) 0.1 0.0 - 0.4 x10E3/uL   Basophils Absolute 0.0 0.0 - 0.2 x10E3/uL   Immature Granulocytes 0 Not Estab. %   Immature Grans (Abs) 0.0  0.0 - 0.1 x10E3/uL  Comprehensive metabolic panel  Result Value Ref Range   Glucose 109 (H) 65 - 99 mg/dL   BUN 11 8 - 27 mg/dL   Creatinine, Ser 0.80 0.57 - 1.00 mg/dL   GFR calc non Af Amer 73 >59 mL/min/1.73   GFR calc Af Amer 85 >59 mL/min/1.73   BUN/Creatinine Ratio 14 12 - 28   Sodium 141 134 - 144 mmol/L   Potassium 4.4 3.5 -  5.2 mmol/L   Chloride 101 96 - 106 mmol/L   CO2 26 20 - 29 mmol/L   Calcium 9.7 8.7 - 10.3 mg/dL   Total Protein 7.1 6.0 - 8.5 g/dL   Albumin 4.5 3.7 - 4.7 g/dL   Globulin, Total 2.6 1.5 - 4.5 g/dL   Albumin/Globulin Ratio 1.7 1.2 - 2.2   Bilirubin Total 0.6 0.0 - 1.2 mg/dL   Alkaline Phosphatase 95 44 - 121 IU/L   AST 89 (H) 0 - 40 IU/L   ALT 56 (H) 0 - 32 IU/L  Lipid Panel w/o Chol/HDL Ratio  Result Value Ref Range   Cholesterol, Total 205 (H) 100 - 199 mg/dL   Triglycerides 123 0 - 149 mg/dL   HDL 52 >39 mg/dL   VLDL Cholesterol Cal 22 5 - 40 mg/dL   LDL Chol Calc (NIH) 131 (H) 0 - 99 mg/dL  Microalbumin, Urine Waived  Result Value Ref Range   Microalb, Ur Waived 30 (H) 0 - 19 mg/L   Creatinine, Urine Waived 100 10 - 300 mg/dL   Microalb/Creat Ratio <30 <30 mg/g  Urinalysis, Routine w reflex microscopic  Result Value Ref Range   Specific Gravity, UA 1.015 1.005 - 1.030   pH, UA 6.0 5.0 - 7.5   Color, UA Yellow Yellow   Appearance Ur Clear Clear   Leukocytes,UA 1+ (A) Negative   Protein,UA Negative Negative/Trace   Glucose, UA Negative Negative   Ketones, UA Negative Negative   RBC, UA Trace (A) Negative   Bilirubin, UA Negative Negative   Urobilinogen, Ur 0.2 0.2 - 1.0 mg/dL   Nitrite, UA Negative Negative   Microscopic Examination See below:   Bayer DCA Hb A1c Waived  Result Value Ref Range   HB A1C (BAYER DCA - WAIVED) 5.8 <7.0 %  Uric acid  Result Value Ref Range   Uric Acid 6.2 3.1 - 7.9 mg/dL  B12  Result Value Ref Range   Vitamin B-12 385 232 - 1,245 pg/mL  VITAMIN D 25 Hydroxy (Vit-D Deficiency, Fractures)  Result Value  Ref Range   Vit D, 25-Hydroxy 27.8 (L) 30.0 - 100.0 ng/mL  TSH  Result Value Ref Range   TSH 2.860 0.450 - 4.500 uIU/mL        Current Outpatient Medications:  .  aspirin EC 81 MG tablet, Take 81 mg by mouth daily., Disp: , Rfl:  .  atorvastatin (LIPITOR) 10 MG tablet, Take 1 tablet (10 mg total) by mouth at bedtime., Disp: 90 tablet, Rfl: 1 .  clobetasol (TEMOVATE) 0.05 % external solution, Apply 1 application topically 2 (two) times daily. Apply to affected areas on scalp daily for 2 weeks then decrease daily 5 times a week, Disp: 50 mL, Rfl: 3 .  hydrochlorothiazide (MICROZIDE) 12.5 MG capsule, Take 1 capsule (12.5 mg total) by mouth daily., Disp: 90 capsule, Rfl: 1 .  HYDROcodone-acetaminophen (NORCO/VICODIN) 5-325 MG tablet, Take 1 tablet by mouth 3 (three) times daily as needed for moderate pain., Disp: 70 tablet, Rfl: 0 .  losartan (COZAAR) 50 MG tablet, Take 1 tablet (50 mg total) by mouth daily., Disp: 90 tablet, Rfl: 0 .  omeprazole (PRILOSEC OTC) 20 MG tablet, Take by mouth., Disp: , Rfl:   Current Facility-Administered Medications:  .  cyanocobalamin ((VITAMIN B-12)) injection 1,000 mcg, 1,000 mcg, Intramuscular, Q30 days, [place], Megan P, DO    Assessment & Plan:  1. HTN : is on losartan and hctz 12.5 mg  - stop sec to gout  Continue current meds.  Medication compliance emphasised. pt advised to keep Bp logs. Pt verbalised understanding of the same. Pt to have a low salt diet . Exercise to reach a goal of at least 150 mins a week.  lifestyle modifications explained and pt understands importance of the above.  2. Gout not on any prophylactic twice a year. Check uric acid.   3. HLD is on lipitor for such recheck FLP, check LFT's work on diet, SE of meds explained to pt. low fat and high fiber diet explained to pt.  4. Bradycardia ? Needs a heart monitor to d/w Cardiology this wekk. asap  Problem List Items Addressed This Visit      Other   Gout - Primary   Relevant  Orders   Uric acid   Urinalysis, Routine w reflex microscopic    Other Visit Diagnoses    Hypertension, unspecified type       Relevant Medications   losartan (COZAAR) 50 MG tablet   spironolactone (ALDACTONE) 25 MG tablet   Other Relevant Orders   Uric acid   CBC with Differential/Platelet   Lipid Panel w/o Chol/HDL Ratio   Comprehensive metabolic panel   TSH   Urinalysis, Routine w reflex microscopic       Follow up plan: No follow-ups on file.

## 2020-07-02 LAB — CBC WITH DIFFERENTIAL/PLATELET
Basophils Absolute: 0 10*3/uL (ref 0.0–0.2)
Basos: 1 %
EOS (ABSOLUTE): 0.1 10*3/uL (ref 0.0–0.4)
Eos: 3 %
Hematocrit: 40.8 % (ref 34.0–46.6)
Hemoglobin: 14.1 g/dL (ref 11.1–15.9)
Immature Grans (Abs): 0 10*3/uL (ref 0.0–0.1)
Immature Granulocytes: 0 %
Lymphocytes Absolute: 1.4 10*3/uL (ref 0.7–3.1)
Lymphs: 33 %
MCH: 33.7 pg — ABNORMAL HIGH (ref 26.6–33.0)
MCHC: 34.6 g/dL (ref 31.5–35.7)
MCV: 97 fL (ref 79–97)
Monocytes Absolute: 0.5 10*3/uL (ref 0.1–0.9)
Monocytes: 13 %
Neutrophils Absolute: 2.1 10*3/uL (ref 1.4–7.0)
Neutrophils: 50 %
Platelets: 185 10*3/uL (ref 150–450)
RBC: 4.19 x10E6/uL (ref 3.77–5.28)
RDW: 11.7 % (ref 11.7–15.4)
WBC: 4.2 10*3/uL (ref 3.4–10.8)

## 2020-07-02 LAB — URINALYSIS, ROUTINE W REFLEX MICROSCOPIC
Bilirubin, UA: NEGATIVE
Glucose, UA: NEGATIVE
Ketones, UA: NEGATIVE
Nitrite, UA: NEGATIVE
Protein,UA: NEGATIVE
Specific Gravity, UA: 1.015 (ref 1.005–1.030)
Urobilinogen, Ur: 0.2 mg/dL (ref 0.2–1.0)
pH, UA: 6.5 (ref 5.0–7.5)

## 2020-07-02 LAB — COMPREHENSIVE METABOLIC PANEL
ALT: 46 IU/L — ABNORMAL HIGH (ref 0–32)
AST: 51 IU/L — ABNORMAL HIGH (ref 0–40)
Albumin/Globulin Ratio: 1.5 (ref 1.2–2.2)
Albumin: 4.4 g/dL (ref 3.7–4.7)
Alkaline Phosphatase: 114 IU/L (ref 44–121)
BUN/Creatinine Ratio: 11 — ABNORMAL LOW (ref 12–28)
BUN: 10 mg/dL (ref 8–27)
Bilirubin Total: 0.6 mg/dL (ref 0.0–1.2)
CO2: 24 mmol/L (ref 20–29)
Calcium: 9.8 mg/dL (ref 8.7–10.3)
Chloride: 102 mmol/L (ref 96–106)
Creatinine, Ser: 0.9 mg/dL (ref 0.57–1.00)
Globulin, Total: 2.9 g/dL (ref 1.5–4.5)
Glucose: 113 mg/dL — ABNORMAL HIGH (ref 65–99)
Potassium: 4.4 mmol/L (ref 3.5–5.2)
Sodium: 142 mmol/L (ref 134–144)
Total Protein: 7.3 g/dL (ref 6.0–8.5)
eGFR: 67 mL/min/{1.73_m2} (ref >59)

## 2020-07-02 LAB — LIPID PANEL W/O CHOL/HDL RATIO
Cholesterol, Total: 209 mg/dL — ABNORMAL HIGH (ref 100–199)
HDL: 62 mg/dL (ref >39)
LDL Chol Calc (NIH): 125 mg/dL — ABNORMAL HIGH (ref 0–99)
Triglycerides: 122 mg/dL (ref 0–149)
VLDL Cholesterol Cal: 22 mg/dL (ref 5–40)

## 2020-07-02 LAB — MICROSCOPIC EXAMINATION
Bacteria, UA: NONE SEEN
RBC, Urine: NONE SEEN /hpf (ref 0–2)

## 2020-07-02 LAB — URIC ACID: Uric Acid: 6.1 mg/dL (ref 3.1–7.9)

## 2020-07-02 LAB — TSH: TSH: 3.68 u[IU]/mL (ref 0.450–4.500)

## 2020-07-05 DIAGNOSIS — I10 Essential (primary) hypertension: Secondary | ICD-10-CM | POA: Diagnosis not present

## 2020-07-05 DIAGNOSIS — E7800 Pure hypercholesterolemia, unspecified: Secondary | ICD-10-CM | POA: Diagnosis not present

## 2020-07-05 DIAGNOSIS — R600 Localized edema: Secondary | ICD-10-CM | POA: Diagnosis not present

## 2020-07-05 DIAGNOSIS — R079 Chest pain, unspecified: Secondary | ICD-10-CM | POA: Diagnosis not present

## 2020-07-05 DIAGNOSIS — E785 Hyperlipidemia, unspecified: Secondary | ICD-10-CM | POA: Diagnosis not present

## 2020-07-05 DIAGNOSIS — R002 Palpitations: Secondary | ICD-10-CM | POA: Diagnosis not present

## 2020-07-23 DIAGNOSIS — R002 Palpitations: Secondary | ICD-10-CM | POA: Diagnosis not present

## 2020-07-24 DIAGNOSIS — E7800 Pure hypercholesterolemia, unspecified: Secondary | ICD-10-CM | POA: Diagnosis not present

## 2020-07-24 DIAGNOSIS — R079 Chest pain, unspecified: Secondary | ICD-10-CM | POA: Diagnosis not present

## 2020-07-24 DIAGNOSIS — R600 Localized edema: Secondary | ICD-10-CM | POA: Diagnosis not present

## 2020-07-24 DIAGNOSIS — E785 Hyperlipidemia, unspecified: Secondary | ICD-10-CM | POA: Diagnosis not present

## 2020-07-24 DIAGNOSIS — R7989 Other specified abnormal findings of blood chemistry: Secondary | ICD-10-CM | POA: Diagnosis not present

## 2020-07-24 DIAGNOSIS — R002 Palpitations: Secondary | ICD-10-CM | POA: Diagnosis not present

## 2020-07-24 DIAGNOSIS — I10 Essential (primary) hypertension: Secondary | ICD-10-CM | POA: Diagnosis not present

## 2020-07-25 ENCOUNTER — Ambulatory Visit: Payer: Medicare Other | Admitting: Family Medicine

## 2020-07-25 DIAGNOSIS — G894 Chronic pain syndrome: Secondary | ICD-10-CM | POA: Diagnosis not present

## 2020-07-25 DIAGNOSIS — M47814 Spondylosis without myelopathy or radiculopathy, thoracic region: Secondary | ICD-10-CM | POA: Diagnosis not present

## 2020-07-25 DIAGNOSIS — M47817 Spondylosis without myelopathy or radiculopathy, lumbosacral region: Secondary | ICD-10-CM | POA: Diagnosis not present

## 2020-07-25 DIAGNOSIS — M47812 Spondylosis without myelopathy or radiculopathy, cervical region: Secondary | ICD-10-CM | POA: Diagnosis not present

## 2020-07-26 ENCOUNTER — Other Ambulatory Visit: Payer: Self-pay

## 2020-07-26 ENCOUNTER — Encounter: Payer: Self-pay | Admitting: Family Medicine

## 2020-07-26 ENCOUNTER — Ambulatory Visit (INDEPENDENT_AMBULATORY_CARE_PROVIDER_SITE_OTHER): Payer: Medicare Other | Admitting: Family Medicine

## 2020-07-26 VITALS — BP 157/72 | HR 66 | Temp 98.7°F | Wt 189.8 lb

## 2020-07-26 DIAGNOSIS — J301 Allergic rhinitis due to pollen: Secondary | ICD-10-CM | POA: Diagnosis not present

## 2020-07-26 DIAGNOSIS — I10 Essential (primary) hypertension: Secondary | ICD-10-CM

## 2020-07-26 DIAGNOSIS — E538 Deficiency of other specified B group vitamins: Secondary | ICD-10-CM | POA: Diagnosis not present

## 2020-07-26 MED ORDER — SPIRONOLACTONE 25 MG PO TABS
25.0000 mg | ORAL_TABLET | Freq: Every day | ORAL | 1 refills | Status: DC
Start: 2020-07-26 — End: 2021-02-21

## 2020-07-26 MED ORDER — CYANOCOBALAMIN 1000 MCG/ML IJ SOLN
1000.0000 ug | INTRAMUSCULAR | Status: AC
  Administered 2020-07-26 – 2020-10-15 (×2): 1000 ug via INTRAMUSCULAR

## 2020-07-26 MED ORDER — OMEPRAZOLE MAGNESIUM 20 MG PO TBEC: 20.0000 mg | DELAYED_RELEASE_TABLET | Freq: Every day | ORAL | 1 refills | Status: DC

## 2020-07-26 MED ORDER — LOSARTAN POTASSIUM 50 MG PO TABS: 50.0000 mg | ORAL_TABLET | Freq: Every day | ORAL | 1 refills | Status: DC

## 2020-07-26 NOTE — Assessment & Plan Note (Signed)
Still running a little high. Will check her BMP to check potassium today. Increase spironalactone from 12.5 to 25 and recheck 1 month.

## 2020-07-26 NOTE — Progress Notes (Signed)
BP (!) 157/72   Pulse 66   Temp 98.7 F (37.1 C)   Wt 189 lb 12.8 oz (86.1 kg)   LMP  (LMP Unknown)   SpO2 97%   BMI 32.17 kg/m    Subjective:    Patient ID: Tamara Fleming, female    DOB: 1945/08/25, 75 y.o.   MRN: [id number]  HPI: Tamara Fleming is a 75 y.o. female  Chief Complaint  Patient presents with  . Hypertension    Follow up   . Labs Only    Patient states she had labs done last month, would like to know results of the labs.   . B12 Injection    Patient states she received one b12 injection months ago and was never given another one. Would like to know if she can continue to get them.   . Sore Throat    Patient states she is having a runny nose and draining is making her throat sore.    HYPERTENSION / HYPERLIPIDEMIA Satisfied with current treatment? yes Duration of hypertension: chronic BP monitoring frequency: not checking BP medication side effects: no Past BP meds: losartan, spironalactone Duration of hyperlipidemia: chronic Cholesterol medication side effects: no Cholesterol supplements: none Past cholesterol medications: atorvastatin Medication compliance: excellent compliance Aspirin: yes Recent stressors: no Recurrent headaches: no Visual changes: no Palpitations: no Dyspnea: no Chest pain: no Lower extremity edema: no Dizzy/lightheaded: no  UPPER RESPIRATORY TRACT INFECTION Duration: yesterday Worst symptom: raspy voice and sore throat Fever: no Cough: no Shortness of breath: no Wheezing: no Chest pain: no Chest tightness: no Chest congestion: no Nasal congestion: no Runny nose: yes Post nasal drip: yes Sneezing: no Sore throat: yes Swollen glands: no Sinus pressure: no Headache: no Face pain: no Toothache: no Ear pain: no  Ear pressure: no  Eyes red/itching:yes Eye drainage/crusting: no  Vomiting: no Rash: no Fatigue: no Sick contacts: no Strep contacts: no  Context: stable Recurrent sinusitis: no Relief with OTC  cold/cough medications: no  Treatments attempted: none    Relevant past medical, surgical, family and social history reviewed and updated as indicated. Interim medical history since our last visit reviewed. Allergies and medications reviewed and updated.  Review of Systems  Constitutional: Negative.   Respiratory: Negative.   Cardiovascular: Negative.   Gastrointestinal: Negative.   Musculoskeletal: Negative.   Neurological: Negative.   Psychiatric/Behavioral: Negative.     Per HPI unless specifically indicated above     Objective:    BP (!) 157/72   Pulse 66   Temp 98.7 F (37.1 C)   Wt 189 lb 12.8 oz (86.1 kg)   LMP  (LMP Unknown)   SpO2 97%   BMI 32.17 kg/m   Wt Readings from Last 3 Encounters:  07/26/20 189 lb 12.8 oz (86.1 kg)  07/01/20 187 lb 6.4 oz (85 kg)  12/28/19 188 lb (85.3 kg)    Physical Exam Vitals and nursing note reviewed.  Constitutional:      General: She is not in acute distress.    Appearance: Normal appearance. She is not ill-appearing, toxic-appearing or diaphoretic.  HENT:     Head: Normocephalic and atraumatic.     Right Ear: External ear normal.     Left Ear: External ear normal.     Nose: Nose normal.     Mouth/Throat:     Mouth: Mucous membranes are moist.     Pharynx: Oropharynx is clear.  Eyes:     General: No scleral icterus.  Right eye: No discharge.        Left eye: No discharge.     Extraocular Movements: Extraocular movements intact.     Conjunctiva/sclera: Conjunctivae normal.     Pupils: Pupils are equal, round, and reactive to light.  Cardiovascular:     Rate and Rhythm: Normal rate and regular rhythm.     Pulses: Normal pulses.     Heart sounds: Normal heart sounds. No murmur heard. No friction rub. No gallop.   Pulmonary:     Effort: Pulmonary effort is normal. No respiratory distress.     Breath sounds: Normal breath sounds. No stridor. No wheezing, rhonchi or rales.  Chest:     Chest wall: No tenderness.   Musculoskeletal:        General: Normal range of motion.     Cervical back: Normal range of motion and neck supple.  Skin:    General: Skin is warm and dry.     Capillary Refill: Capillary refill takes less than 2 seconds.     Coloration: Skin is not jaundiced or pale.     Findings: No bruising, erythema, lesion or rash.  Neurological:     General: No focal deficit present.     Mental Status: She is alert and oriented to person, place, and time. Mental status is at baseline.  Psychiatric:        Mood and Affect: Mood normal.        Behavior: Behavior normal.        Thought Content: Thought content normal.        Judgment: Judgment normal.     Results for orders placed or performed in visit on 07/01/20  Microscopic Examination   Urine  Result Value Ref Range   WBC, UA 0-5 0 - 5 /hpf   RBC None seen 0 - 2 /hpf   Epithelial Cells (non renal) 0-10 0 - 10 /hpf   Bacteria, UA None seen None seen/Few  Uric acid  Result Value Ref Range   Uric Acid 6.1 3.1 - 7.9 mg/dL  CBC with Differential/Platelet  Result Value Ref Range   WBC 4.2 3.4 - 10.8 x10E3/uL   RBC 4.19 3.77 - 5.28 x10E6/uL   Hemoglobin 14.1 11.1 - 15.9 g/dL   Hematocrit 40.8 34.0 - 46.6 %   MCV 97 79 - 97 fL   MCH 33.7 (H) 26.6 - 33.0 pg   MCHC 34.6 31.5 - 35.7 g/dL   RDW 11.7 11.7 - 15.4 %   Platelets 185 150 - 450 x10E3/uL   Neutrophils 50 Not Estab. %   Lymphs 33 Not Estab. %   Monocytes 13 Not Estab. %   Eos 3 Not Estab. %   Basos 1 Not Estab. %   Neutrophils Absolute 2.1 1.4 - 7.0 x10E3/uL   Lymphocytes Absolute 1.4 0.7 - 3.1 x10E3/uL   Monocytes Absolute 0.5 0.1 - 0.9 x10E3/uL   EOS (ABSOLUTE) 0.1 0.0 - 0.4 x10E3/uL   Basophils Absolute 0.0 0.0 - 0.2 x10E3/uL   Immature Granulocytes 0 Not Estab. %   Immature Grans (Abs) 0.0 0.0 - 0.1 x10E3/uL  Lipid Panel w/o Chol/HDL Ratio  Result Value Ref Range   Cholesterol, Total 209 (H) 100 - 199 mg/dL   Triglycerides 122 0 - 149 mg/dL   HDL 62 >39 mg/dL    VLDL Cholesterol Cal 22 5 - 40 mg/dL   LDL Chol Calc (NIH) 125 (H) 0 - 99 mg/dL  Comprehensive metabolic panel  Result Value Ref  Range   Glucose 113 (H) 65 - 99 mg/dL   BUN 10 8 - 27 mg/dL   Creatinine, Ser 0.90 0.57 - 1.00 mg/dL   eGFR 67 >59 mL/min/1.73   BUN/Creatinine Ratio 11 (L) 12 - 28   Sodium 142 134 - 144 mmol/L   Potassium 4.4 3.5 - 5.2 mmol/L   Chloride 102 96 - 106 mmol/L   CO2 24 20 - 29 mmol/L   Calcium 9.8 8.7 - 10.3 mg/dL   Total Protein 7.3 6.0 - 8.5 g/dL   Albumin 4.4 3.7 - 4.7 g/dL   Globulin, Total 2.9 1.5 - 4.5 g/dL   Albumin/Globulin Ratio 1.5 1.2 - 2.2   Bilirubin Total 0.6 0.0 - 1.2 mg/dL   Alkaline Phosphatase 114 44 - 121 IU/L   AST 51 (H) 0 - 40 IU/L   ALT 46 (H) 0 - 32 IU/L  TSH  Result Value Ref Range   TSH 3.680 0.450 - 4.500 uIU/mL  Urinalysis, Routine w reflex microscopic  Result Value Ref Range   Specific Gravity, UA 1.015 1.005 - 1.030   pH, UA 6.5 5.0 - 7.5   Color, UA Yellow Yellow   Appearance Ur Cloudy (A) Clear   Leukocytes,UA 2+ (A) Negative   Protein,UA Negative Negative/Trace   Glucose, UA Negative Negative   Ketones, UA Negative Negative   RBC, UA Trace (A) Negative   Bilirubin, UA Negative Negative   Urobilinogen, Ur 0.2 0.2 - 1.0 mg/dL   Nitrite, UA Negative Negative   Microscopic Examination See below:       Assessment & Plan:   Problem List Items Addressed This Visit      Cardiovascular and Mediastinum   Essential hypertension - Primary    Still running a little high. Will check her BMP to check potassium today. Increase spironalactone from 12.5 to 25 and recheck 1 month.       Relevant Medications   spironolactone (ALDACTONE) 25 MG tablet   losartan (COZAAR) 50 MG tablet   Other Relevant Orders   Basic metabolic panel     Other   B12 deficiency    Has been off her B12 shots for a while- will get them restarted. Call with any concerns.       Relevant Medications   cyanocobalamin ((VITAMIN B-12)) injection  1,000 mcg    Other Visit Diagnoses    Seasonal allergic rhinitis due to pollen       Will start zyrtec at night. If likes it will let us know and we'll send an Rx. Call with any concenrs. Continue to monitor.       Follow up plan: Return in about 4 weeks (around 08/23/2020) for BP follow up and B12 shot.

## 2020-07-26 NOTE — Assessment & Plan Note (Signed)
Has been off her B12 shots for a while- will get them restarted. Call with any concerns.

## 2020-07-27 LAB — BASIC METABOLIC PANEL
BUN/Creatinine Ratio: 15 (ref 12–28)
BUN: 12 mg/dL (ref 8–27)
CO2: 22 mmol/L (ref 20–29)
Calcium: 9.5 mg/dL (ref 8.7–10.3)
Chloride: 101 mmol/L (ref 96–106)
Creatinine, Ser: 0.8 mg/dL (ref 0.57–1.00)
Glucose: 113 mg/dL — ABNORMAL HIGH (ref 65–99)
Potassium: 4.4 mmol/L (ref 3.5–5.2)
Sodium: 141 mmol/L (ref 134–144)
eGFR: 77 mL/min/{1.73_m2} (ref >59)

## 2020-08-30 DIAGNOSIS — Z79891 Long term (current) use of opiate analgesic: Secondary | ICD-10-CM | POA: Diagnosis not present

## 2020-08-30 DIAGNOSIS — Z79899 Other long term (current) drug therapy: Secondary | ICD-10-CM | POA: Diagnosis not present

## 2020-08-30 DIAGNOSIS — M47812 Spondylosis without myelopathy or radiculopathy, cervical region: Secondary | ICD-10-CM | POA: Diagnosis not present

## 2020-08-30 DIAGNOSIS — M47814 Spondylosis without myelopathy or radiculopathy, thoracic region: Secondary | ICD-10-CM | POA: Diagnosis not present

## 2020-08-30 DIAGNOSIS — G894 Chronic pain syndrome: Secondary | ICD-10-CM | POA: Diagnosis not present

## 2020-08-30 DIAGNOSIS — M47817 Spondylosis without myelopathy or radiculopathy, lumbosacral region: Secondary | ICD-10-CM | POA: Diagnosis not present

## 2020-09-11 ENCOUNTER — Encounter: Payer: Self-pay | Admitting: Family Medicine

## 2020-09-11 ENCOUNTER — Other Ambulatory Visit: Payer: Self-pay

## 2020-09-11 ENCOUNTER — Ambulatory Visit (INDEPENDENT_AMBULATORY_CARE_PROVIDER_SITE_OTHER): Payer: Medicare Other | Admitting: Family Medicine

## 2020-09-11 VITALS — BP 158/78 | HR 65 | Temp 98.5°F | Wt 187.0 lb

## 2020-09-11 DIAGNOSIS — H538 Other visual disturbances: Secondary | ICD-10-CM | POA: Diagnosis not present

## 2020-09-11 DIAGNOSIS — I700 Atherosclerosis of aorta: Secondary | ICD-10-CM

## 2020-09-11 DIAGNOSIS — I10 Essential (primary) hypertension: Secondary | ICD-10-CM | POA: Diagnosis not present

## 2020-09-11 DIAGNOSIS — R42 Dizziness and giddiness: Secondary | ICD-10-CM | POA: Diagnosis not present

## 2020-09-11 DIAGNOSIS — E538 Deficiency of other specified B group vitamins: Secondary | ICD-10-CM

## 2020-09-11 MED ORDER — CYANOCOBALAMIN 1000 MCG/ML IJ SOLN
1000.0000 ug | Freq: Once | INTRAMUSCULAR | Status: AC
  Administered 2020-09-11: 1000 ug via INTRAMUSCULAR

## 2020-09-11 MED ORDER — ATORVASTATIN CALCIUM 10 MG PO TABS: 10.0000 mg | ORAL_TABLET | Freq: Every day | ORAL | 1 refills | Status: DC

## 2020-09-11 NOTE — Progress Notes (Signed)
BP (!) 158/78   Pulse 65   Temp 98.5 F (36.9 C)   Wt 187 lb (84.8 kg)   LMP  (LMP Unknown)   SpO2 97%   BMI 31.69 kg/m    Subjective:    Patient ID: Tamara Fleming, female    DOB: 29-Oct-1945, 75 y.o.   MRN: [id number]  HPI: Tamara Fleming is a 75 y.o. female  Chief Complaint  Patient presents with   Hypertension    Refills needed today    B12 Injection   HYPERTENSION- has been out of her losartan for 2 days. Has been feeling well on the higher dose of spironalactone.  Hypertension status: uncontrolled  Satisfied with current treatment? yes Duration of hypertension: chronic BP monitoring frequency:  not checking BP medication side effects:  no Medication compliance: good compliance Previous BP meds: spironalactone, losartan Aspirin: no Recurrent headaches: no Visual changes: no Palpitations: no Dyspnea: no Chest pain: no Lower extremity edema: no Dizzy/lightheaded: no  Notes that she continues with blurred vision and dizziness when she goes from looking down to looking up. She notes that this is chronic and has been getting worse. She saw ophthalmology previously about this, but states that she was not told what was going on with it. She notes that she was unhappy with the treatment her last ophthalmologist gave her last time. She would like to back to see the person that her husband sees. If nothing is wrong on her eye exam, consider neurology referral.    Relevant past medical, surgical, family and social history reviewed and updated as indicated. Interim medical history since our last visit reviewed. Allergies and medications reviewed and updated.  Review of Systems  Constitutional: Negative.   Eyes:  Positive for visual disturbance. Negative for photophobia, pain, discharge, redness and itching.  Respiratory: Negative.    Cardiovascular: Negative.   Gastrointestinal: Negative.   Neurological:  Positive for dizziness and light-headedness. Negative for  tremors, seizures, syncope, facial asymmetry, speech difficulty, weakness, numbness and headaches.  Psychiatric/Behavioral: Negative.     Per HPI unless specifically indicated above     Objective:    BP (!) 158/78   Pulse 65   Temp 98.5 F (36.9 C)   Wt 187 lb (84.8 kg)   LMP  (LMP Unknown)   SpO2 97%   BMI 31.69 kg/m   Wt Readings from Last 3 Encounters:  09/11/20 187 lb (84.8 kg)  07/26/20 189 lb 12.8 oz (86.1 kg)  07/01/20 187 lb 6.4 oz (85 kg)    Physical Exam Vitals and nursing note reviewed.  Constitutional:      General: She is not in acute distress.    Appearance: Normal appearance. She is not ill-appearing, toxic-appearing or diaphoretic.  HENT:     Head: Normocephalic and atraumatic.     Right Ear: External ear normal.     Left Ear: External ear normal.     Nose: Nose normal.     Mouth/Throat:     Mouth: Mucous membranes are moist.     Pharynx: Oropharynx is clear.  Eyes:     General: No scleral icterus.       Right eye: No discharge.        Left eye: No discharge.     Extraocular Movements: Extraocular movements intact.     Conjunctiva/sclera: Conjunctivae normal.     Pupils: Pupils are equal, round, and reactive to light.  Cardiovascular:     Rate and Rhythm: Normal rate and  regular rhythm.     Pulses: Normal pulses.     Heart sounds: Normal heart sounds. No murmur heard.   No friction rub. No gallop.  Pulmonary:     Effort: Pulmonary effort is normal. No respiratory distress.     Breath sounds: Normal breath sounds. No stridor. No wheezing, rhonchi or rales.  Chest:     Chest wall: No tenderness.  Musculoskeletal:        General: Normal range of motion.     Cervical back: Normal range of motion and neck supple.  Skin:    General: Skin is warm and dry.     Capillary Refill: Capillary refill takes less than 2 seconds.     Coloration: Skin is not jaundiced or pale.     Findings: No bruising, erythema, lesion or rash.  Neurological:     General:  No focal deficit present.     Mental Status: She is alert and oriented to person, place, and time. Mental status is at baseline.  Psychiatric:        Mood and Affect: Mood normal.        Behavior: Behavior normal.        Thought Content: Thought content normal.        Judgment: Judgment normal.    Results for orders placed or performed in visit on 88/67/73  Basic metabolic panel  Result Value Ref Range   Glucose 139 (H) 65 - 99 mg/dL   BUN 12 8 - 27 mg/dL   Creatinine, Ser 0.87 0.57 - 1.00 mg/dL   eGFR 70 >59 mL/min/1.73   BUN/Creatinine Ratio 14 12 - 28   Sodium 140 134 - 144 mmol/L   Potassium 4.6 3.5 - 5.2 mmol/L   Chloride 101 96 - 106 mmol/L   CO2 23 20 - 29 mmol/L   Calcium 9.7 8.7 - 10.3 mg/dL      Assessment & Plan:   Problem List Items Addressed This Visit       Cardiovascular and Mediastinum   Essential hypertension - Primary    Not under good control, but has not had her losartan in 2 days- will recheck BP when she comes in for her B12 shot in 1 month and see if we need to adjust dose. Continue to monitor. BMP checked today.       Relevant Medications   atorvastatin (LIPITOR) 10 MG tablet   Other Relevant Orders   Basic metabolic panel (Completed)   Aortic atherosclerosis ([place])    Will keep her BP and cholesterol under good control. Continue to monitor.        Relevant Medications   atorvastatin (LIPITOR) 10 MG tablet     Other   B12 deficiency    B12 shot given today. Feeling well. Call with any concerns.        Other Visit Diagnoses     Blurred vision       Will get her back to see opthalmology. If normal exam, consider neurolgy referral.    Relevant Orders   Ambulatory referral to Ophthalmology   Dizziness       Will get her back to see opthalmology. If normal exam, consider neurolgy referral.    Relevant Orders   Ambulatory referral to Ophthalmology        Follow up plan: Return for 1 month nurse visit B12 shot and BP check, 6 months  with me .

## 2020-09-12 ENCOUNTER — Encounter: Payer: Self-pay | Admitting: Family Medicine

## 2020-09-12 LAB — BASIC METABOLIC PANEL
BUN/Creatinine Ratio: 14 (ref 12–28)
BUN: 12 mg/dL (ref 8–27)
CO2: 23 mmol/L (ref 20–29)
Calcium: 9.7 mg/dL (ref 8.7–10.3)
Chloride: 101 mmol/L (ref 96–106)
Creatinine, Ser: 0.87 mg/dL (ref 0.57–1.00)
Glucose: 139 mg/dL — ABNORMAL HIGH (ref 65–99)
Potassium: 4.6 mmol/L (ref 3.5–5.2)
Sodium: 140 mmol/L (ref 134–144)
eGFR: 70 mL/min/{1.73_m2} (ref >59)

## 2020-09-12 NOTE — Assessment & Plan Note (Signed)
Will keep her BP and cholesterol under good control. Continue to monitor.  

## 2020-09-12 NOTE — Assessment & Plan Note (Signed)
B12 shot given today. Feeling well. Call with any concerns.

## 2020-09-12 NOTE — Assessment & Plan Note (Signed)
Not under good control, but has not had her losartan in 2 days- will recheck BP when she comes in for her B12 shot in 1 month and see if we need to adjust dose. Continue to monitor. BMP checked today.

## 2020-09-25 DIAGNOSIS — M25559 Pain in unspecified hip: Secondary | ICD-10-CM | POA: Diagnosis not present

## 2020-09-25 DIAGNOSIS — M47817 Spondylosis without myelopathy or radiculopathy, lumbosacral region: Secondary | ICD-10-CM | POA: Diagnosis not present

## 2020-09-25 DIAGNOSIS — M47814 Spondylosis without myelopathy or radiculopathy, thoracic region: Secondary | ICD-10-CM | POA: Diagnosis not present

## 2020-09-25 DIAGNOSIS — G894 Chronic pain syndrome: Secondary | ICD-10-CM | POA: Diagnosis not present

## 2020-09-30 ENCOUNTER — Encounter: Payer: Self-pay | Admitting: Family Medicine

## 2020-09-30 ENCOUNTER — Telehealth: Payer: Self-pay

## 2020-09-30 ENCOUNTER — Other Ambulatory Visit: Payer: Self-pay

## 2020-09-30 ENCOUNTER — Ambulatory Visit (INDEPENDENT_AMBULATORY_CARE_PROVIDER_SITE_OTHER): Payer: Medicare Other | Admitting: Family Medicine

## 2020-09-30 VITALS — BP 108/72 | HR 73 | Temp 98.5°F | Resp 16 | Wt 186.0 lb

## 2020-09-30 DIAGNOSIS — J069 Acute upper respiratory infection, unspecified: Secondary | ICD-10-CM | POA: Diagnosis not present

## 2020-09-30 MED ORDER — HYDROCOD POLST-CPM POLST ER 10-8 MG/5ML PO SUER: 5.0000 mL | Freq: Two times a day (BID) | ORAL | 0 refills | Status: DC | PRN

## 2020-09-30 MED ORDER — PREDNISONE 10 MG PO TABS: ORAL_TABLET | ORAL | 0 refills | Status: DC

## 2020-09-30 MED ORDER — BENZONATATE 200 MG PO CAPS
200.0000 mg | ORAL_CAPSULE | Freq: Two times a day (BID) | ORAL | 0 refills | Status: DC | PRN
Start: 2020-09-30 — End: 2020-10-23

## 2020-09-30 NOTE — Progress Notes (Signed)
BP 108/72   Pulse 73   Temp 98.5 F (36.9 C) (Oral)   Resp 16   Wt 186 lb (84.4 kg)   LMP  (LMP Unknown)   SpO2 97%   BMI 31.52 kg/m    Subjective:    Patient ID: Tamara Fleming, female    DOB: 22-Nov-1945, 75 y.o.   MRN: [id number]  HPI: Tamara Fleming is a 75 y.o. female  Chief Complaint  Patient presents with   URI    Upper Respiratory Infection: Patient complains of symptoms of a URI. Onset of symptoms was 5 days ago, gradually worsening since that time. She also c/o achiness, congestion, lightheadedness, nasal congestion, no  fever, post nasal drip, productive cough with  yellow and green colored sputum, sinus pressure, and vertigo for the past 5 days .  She is drinking plenty of fluids. Evaluation to date: none. Treatment to date: antihistamines.      UPPER RESPIRATORY TRACT INFECTION Duration: 6 days Worst symptom: congestion Fever: no Cough: yes Shortness of breath: yes Wheezing: yes Chest pain: no Chest tightness: yes Chest congestion: yes Nasal congestion: yes Runny nose: yes Post nasal drip: yes Sneezing: no Sore throat: yes Swollen glands: no Sinus pressure: yes Headache: yes Face pain: yes Toothache: no Ear pain: no  Ear pressure: no  Eyes red/itching:no Eye drainage/crusting: no  Vomiting: no Rash: no Fatigue: yes Sick contacts: yes Strep contacts: no  Context: worse Recurrent sinusitis: no Relief with OTC cold/cough medications: no  Treatments attempted: cold/sinus and mucinex   Relevant past medical, surgical, family and social history reviewed and updated as indicated. Interim medical history since our last visit reviewed. Allergies and medications reviewed and updated.  Review of Systems  Constitutional:  Positive for chills, diaphoresis and fatigue. Negative for activity change, appetite change, fever and unexpected weight change.  HENT:  Positive for congestion, postnasal drip, rhinorrhea, sinus pressure, sinus pain, sneezing  and sore throat. Negative for dental problem, drooling, ear discharge, ear pain, facial swelling, hearing loss, mouth sores, nosebleeds, tinnitus, trouble swallowing and voice change.   Respiratory:  Positive for cough and chest tightness. Negative for apnea, choking, shortness of breath, wheezing and stridor.   Cardiovascular: Negative.   Musculoskeletal:  Positive for myalgias. Negative for arthralgias, back pain, gait problem, joint swelling, neck pain and neck stiffness.  Skin: Negative.   Psychiatric/Behavioral: Negative.     Per HPI unless specifically indicated above     Objective:    BP 108/72   Pulse 73   Temp 98.5 F (36.9 C) (Oral)   Resp 16   Wt 186 lb (84.4 kg)   LMP  (LMP Unknown)   SpO2 97%   BMI 31.52 kg/m   Wt Readings from Last 3 Encounters:  09/30/20 186 lb (84.4 kg)  09/11/20 187 lb (84.8 kg)  07/26/20 189 lb 12.8 oz (86.1 kg)    Physical Exam Vitals and nursing note reviewed.  Constitutional:      General: She is not in acute distress.    Appearance: Normal appearance. She is not ill-appearing, toxic-appearing or diaphoretic.  HENT:     Head: Normocephalic and atraumatic.     Right Ear: Tympanic membrane, ear canal and external ear normal. There is no impacted cerumen.     Left Ear: Tympanic membrane, ear canal and external ear normal.     Nose: Nose normal. No congestion or rhinorrhea.     Mouth/Throat:     Mouth: Mucous membranes are moist.  Pharynx: Oropharynx is clear. No oropharyngeal exudate or posterior oropharyngeal erythema.  Eyes:     General: No scleral icterus.       Right eye: No discharge.        Left eye: No discharge.     Extraocular Movements: Extraocular movements intact.     Conjunctiva/sclera: Conjunctivae normal.     Pupils: Pupils are equal, round, and reactive to light.  Cardiovascular:     Rate and Rhythm: Normal rate and regular rhythm.     Pulses: Normal pulses.     Heart sounds: Normal heart sounds. No murmur  heard.   No friction rub. No gallop.  Pulmonary:     Effort: Pulmonary effort is normal. No respiratory distress.     Breath sounds: Normal breath sounds. No stridor. No wheezing, rhonchi or rales.  Chest:     Chest wall: No tenderness.  Musculoskeletal:        General: Normal range of motion.     Cervical back: Normal range of motion and neck supple.  Skin:    General: Skin is warm and dry.     Capillary Refill: Capillary refill takes less than 2 seconds.     Coloration: Skin is not jaundiced or pale.     Findings: No bruising, erythema, lesion or rash.  Neurological:     General: No focal deficit present.     Mental Status: She is alert and oriented to person, place, and time. Mental status is at baseline.  Psychiatric:        Mood and Affect: Mood normal.        Behavior: Behavior normal.        Thought Content: Thought content normal.        Judgment: Judgment normal.    Results for orders placed or performed in visit on 84/53/64  Basic metabolic panel  Result Value Ref Range   Glucose 139 (H) 65 - 99 mg/dL   BUN 12 8 - 27 mg/dL   Creatinine, Ser 0.87 0.57 - 1.00 mg/dL   eGFR 70 >59 mL/min/1.73   BUN/Creatinine Ratio 14 12 - 28   Sodium 140 134 - 144 mmol/L   Potassium 4.6 3.5 - 5.2 mmol/L   Chloride 101 96 - 106 mmol/L   CO2 23 20 - 29 mmol/L   Calcium 9.7 8.7 - 10.3 mg/dL      Assessment & Plan:   Problem List Items Addressed This Visit   None Visit Diagnoses     Upper respiratory tract infection, unspecified type    -  Primary   Significant concern for COVID. Will swab for flu and covid. Treat with prednisone and tussionex and tessalon. Recheck lungs 2 weeks. Call with any concerns.    Relevant Orders   Veritor Flu A/B Waived   Novel Coronavirus, NAA ([facility])        Follow up plan: Return in about 2 weeks (around 10/14/2020) for lung recheck.

## 2020-09-30 NOTE — Telephone Encounter (Signed)
In person is fine.

## 2020-09-30 NOTE — Telephone Encounter (Signed)
Copied from [place] [id number]. Topic: Appointment Scheduling - Scheduling Inquiry for Clinic >> Sep 30, 2020  8:33 AM Rayann Heman wrote: Reason for CRM: Patient called this morning. She states that she is having sinus pain/congestion/body aches. I offered a virtual and the patient was upset and would like to come in for an in person visit. Please advise.  Pt SX started wednesday of last week not yet 10 days pt has not been tested for covid and pt wanted to come in was advised that this would be virtual pt wanted to let pcp know she still wanted to come in and was not happy she had to have virtual would like pcp's opinion, I let her know she could take a test and then come in if Negative she declind.

## 2020-10-01 LAB — NOVEL CORONAVIRUS, NAA: SARS-CoV-2, NAA: DETECTED — AB

## 2020-10-01 LAB — VERITOR FLU A/B WAIVED
Influenza A: NEGATIVE
Influenza B: NEGATIVE

## 2020-10-01 LAB — SARS-COV-2, NAA 2 DAY TAT

## 2020-10-02 ENCOUNTER — Encounter: Payer: Self-pay | Admitting: Family Medicine

## 2020-10-04 MED ORDER — ALBUTEROL SULFATE HFA 108 (90 BASE) MCG/ACT IN AERS: 2.0000 | INHALATION_SPRAY | Freq: Four times a day (QID) | RESPIRATORY_TRACT | 0 refills | Status: DC | PRN

## 2020-10-15 ENCOUNTER — Ambulatory Visit (INDEPENDENT_AMBULATORY_CARE_PROVIDER_SITE_OTHER): Payer: Medicare Other | Admitting: Internal Medicine

## 2020-10-15 ENCOUNTER — Other Ambulatory Visit: Payer: Self-pay

## 2020-10-15 ENCOUNTER — Ambulatory Visit (INDEPENDENT_AMBULATORY_CARE_PROVIDER_SITE_OTHER): Payer: Medicare Other

## 2020-10-15 ENCOUNTER — Telehealth: Payer: Self-pay | Admitting: Internal Medicine

## 2020-10-15 DIAGNOSIS — E538 Deficiency of other specified B group vitamins: Secondary | ICD-10-CM

## 2020-10-15 DIAGNOSIS — K5909 Other constipation: Secondary | ICD-10-CM

## 2020-10-15 MED ORDER — LINACLOTIDE 145 MCG PO CAPS: 145.0000 ug | ORAL_CAPSULE | Freq: Every day | ORAL | 0 refills | Status: DC

## 2020-10-15 MED ORDER — LUBIPROSTONE 8 MCG PO CAPS: 8.0000 ug | ORAL_CAPSULE | Freq: Two times a day (BID) | ORAL | 0 refills | Status: DC

## 2020-10-15 MED ORDER — DOCUSATE SODIUM 50 MG PO CAPS: 50.0000 mg | ORAL_CAPSULE | Freq: Two times a day (BID) | ORAL | 0 refills | Status: DC

## 2020-10-15 NOTE — Telephone Encounter (Signed)
Pt seen today

## 2020-10-15 NOTE — Telephone Encounter (Signed)
Insurance is not covering lubiprostone (AMITIZA) 8 MCG capsule  They will cover Linzess 30 count

## 2020-10-15 NOTE — Telephone Encounter (Signed)
Pt upset that med that was ordered is 500 dollars and pharmacy has an alternative. Pt upset that she is so uncomfortable and that the cost is so high.  Called Dr Neomia Dear who is on call for [facility]. Dr Neomia Dear called the medication in to [facility].  Pt stated that [facility] does not have to dose ordered and are "looking around for it." Pt very appreciative to Dr Neomia Dear.

## 2020-10-15 NOTE — Progress Notes (Signed)
LMP  (LMP Unknown)    Subjective:    Patient ID: Tamara Fleming, female    DOB: 1945-07-27, 75 y.o.   MRN: [id number]  No chief complaint on file.   HPI: Tamara Fleming is a 75 y.o. female  June 22 nd symptoms of COVID  Has been really sick .metamucil and dulcolax.   Constipation This is a new (has had Constipation has pooped 2 times in 2 weeks. 4-5 days was last cosntipation says she has been on vicodin for a while and never has had these symptoms.) problem. The current episode started 1 to 4 weeks ago. The problem has been gradually worsening since onset. Associated symptoms include back pain. Pertinent negatives include no abdominal pain, anorexia, bloating, diarrhea, fecal incontinence, fever, flatus, hematochezia, hemorrhoids, melena, nausea, rectal pain or vomiting. Associated symptoms comments: Passing gas for sure per pts verbal record. .   No chief complaint on file.   Relevant past medical, surgical, family and social history reviewed and updated as indicated. Interim medical history since our last visit reviewed. Allergies and medications reviewed and updated.  Review of Systems  Constitutional:  Negative for fever.  Gastrointestinal:  Positive for constipation. Negative for abdominal pain, anorexia, bloating, diarrhea, flatus, hematochezia, hemorrhoids, melena, nausea, rectal pain and vomiting.  Musculoskeletal:  Positive for back pain.   Per HPI unless specifically indicated above     Objective:    LMP  (LMP Unknown)   Wt Readings from Last 3 Encounters:  09/30/20 186 lb (84.4 kg)  09/11/20 187 lb (84.8 kg)  07/26/20 189 lb 12.8 oz (86.1 kg)    Physical Exam Vitals and nursing note reviewed.  Constitutional:      General: She is not in acute distress.    Appearance: Normal appearance. She is not ill-appearing or diaphoretic.  Eyes:     Conjunctiva/sclera: Conjunctivae normal.  Pulmonary:     Breath sounds: No rhonchi.  Abdominal:     General:  Abdomen is flat. Bowel sounds are normal. There is no distension.     Palpations: Abdomen is soft. There is no mass.     Tenderness: There is no abdominal tenderness. There is no guarding.  Skin:    General: Skin is warm and dry.     Coloration: Skin is not jaundiced.     Findings: No erythema.  Neurological:     Mental Status: She is alert.    Results for orders placed or performed in visit on 09/30/20  Novel Coronavirus, NAA ([facility])   Specimen: Saline  Result Value Ref Range   SARS-CoV-2, NAA Detected (A) Not Detected  SARS-COV-2, NAA 2 DAY TAT  Result Value Ref Range   SARS-CoV-2, NAA 2 DAY TAT Performed   Veritor Flu A/B Waived  Result Value Ref Range   Influenza A Negative Negative   Influenza B Negative Negative        Current Outpatient Medications:  .  albuterol (VENTOLIN HFA) 108 (90 Base) MCG/ACT inhaler, Inhale 2 puffs into the lungs every 6 (six) hours as needed for wheezing or shortness of breath., Disp: 8 g, Rfl: 0 .  aspirin EC 81 MG tablet, Take 81 mg by mouth daily., Disp: , Rfl:  .  atorvastatin (LIPITOR) 10 MG tablet, Take 1 tablet (10 mg total) by mouth at bedtime., Disp: 90 tablet, Rfl: 1 .  benzonatate (TESSALON) 200 MG capsule, Take 1 capsule (200 mg total) by mouth 2 (two) times daily as needed for cough., Disp: 20  capsule, Rfl: 0 .  chlorpheniramine-HYDROcodone (TUSSIONEX PENNKINETIC ER) 10-8 MG/5ML SUER, Take 5 mLs by mouth every 12 (twelve) hours as needed., Disp: 50 mL, Rfl: 0 .  clobetasol (TEMOVATE) 0.05 % external solution, Apply 1 application topically 2 (two) times daily. Apply to affected areas on scalp daily for 2 weeks then decrease daily 5 times a week, Disp: 50 mL, Rfl: 3 .  HYDROcodone-acetaminophen (NORCO/VICODIN) 5-325 MG tablet, Take 1 tablet by mouth 3 (three) times daily as needed for moderate pain., Disp: 70 tablet, Rfl: 0 .  losartan (COZAAR) 50 MG tablet, Take 1 tablet (50 mg total) by mouth daily., Disp: 90 tablet, Rfl: 1 .   omeprazole (PRILOSEC OTC) 20 MG tablet, Take 1 tablet (20 mg total) by mouth daily., Disp: 90 tablet, Rfl: 1 .  predniSONE (DELTASONE) 10 MG tablet, 6 tabs in the AM today and tomorrow, 5 tabs the next 2 days, decrease by 1 every other day until gone, Disp: 42 tablet, Rfl: 0 .  spironolactone (ALDACTONE) 25 MG tablet, Take 1 tablet (25 mg total) by mouth daily., Disp: 90 tablet, Rfl: 1  Current Facility-Administered Medications:  .  cyanocobalamin ((VITAMIN B-12)) injection 1,000 mcg, 1,000 mcg, Intramuscular, Q30 days, Johnson, Megan P, DO, 1,000 mcg at 10/15/20 1030    Assessment & Plan:  Constipation :  Will start pt on amitiza bid  Increase water intake, increase fiber in diet.  Checkxray abdomen STAT   Gerd / heartburn continue prilosec for such  Advised to call the office or go to the ER if she develops chest pain , any new onset of bleeding / black stools or  fresh red blood from any orifice,  shortness of breath dizziness or tingling or numbness.  Pt verbalized understanding of such.  Problem List Items Addressed This Visit   None    No orders of the defined types were placed in this encounter.    Meds ordered this encounter  Medications  . DISCONTD: docusate sodium (COLACE) 50 MG capsule    Sig: Take 1 capsule (50 mg total) by mouth 2 (two) times daily.    Dispense:  10 capsule    Refill:  0  . lubiprostone (AMITIZA) 8 MCG capsule    Sig: Take 1 capsule (8 mcg total) by mouth 2 (two) times daily with a meal for 15 days.    Dispense:  30 capsule    Refill:  0     Follow up plan: No follow-ups on file.

## 2020-10-16 ENCOUNTER — Ambulatory Visit
Admission: RE | Admit: 2020-10-16 | Discharge: 2020-10-16 | Disposition: A | Discharge status: Home / Self Care | Payer: Medicare Other | Attending: Internal Medicine | Admitting: Internal Medicine

## 2020-10-16 ENCOUNTER — Telehealth: Payer: Self-pay | Admitting: Internal Medicine

## 2020-10-16 ENCOUNTER — Ambulatory Visit
Admission: RE | Admit: 2020-10-16 | Discharge: 2020-10-16 | Disposition: A | Discharge status: Home / Self Care | Payer: Medicare Other | Source: Ambulatory Visit | Attending: Internal Medicine | Admitting: Internal Medicine

## 2020-10-16 ENCOUNTER — Encounter: Payer: Self-pay | Admitting: Internal Medicine

## 2020-10-16 DIAGNOSIS — K5900 Constipation, unspecified: Secondary | ICD-10-CM | POA: Diagnosis not present

## 2020-10-16 DIAGNOSIS — K5909 Other constipation: Secondary | ICD-10-CM | POA: Diagnosis not present

## 2020-10-16 MED ORDER — LINACLOTIDE 145 MCG PO CAPS
145.0000 ug | ORAL_CAPSULE | Freq: Every day | ORAL | 1 refills | Status: DC
Start: 2020-10-16 — End: 2021-02-21

## 2020-10-16 NOTE — Progress Notes (Signed)
Please let pt know this was normal.

## 2020-10-16 NOTE — Telephone Encounter (Signed)
Called pt this morning she did not get her x-ray yesterday as the imaging center was closed per her verbal record.   She was at the pharmacy who did not have the medications prescribed.  We had to switch out Amitiza for Linzess secondary to insurance coverage. Patient was prescribed Linzess instead at about 5 PM last evening.  There was a problem with the pharmacy stocking this medication and patient was unable to obtain any of these medications yesterday.   Patient says there is an availability for this medication at [facility] will resend prescription to this pharmacy.  Patient verbalized understanding that she will go get her x-ray today. Will start Linzess. If she does not feel better by this evening she will have to go to the ER for further imaging and work-up of constipation .  Shakil Dirk

## 2020-10-16 NOTE — Telephone Encounter (Signed)
FYI

## 2020-10-17 ENCOUNTER — Telehealth: Payer: Self-pay | Admitting: Internal Medicine

## 2020-10-17 NOTE — Telephone Encounter (Signed)
Yes! Dont worry

## 2020-10-17 NOTE — Telephone Encounter (Signed)
Called pt regarding normal Xrays she has had a bowel movt since starting linzess. She has some discomfort but denies abomindal pain, no bleeding or dark stools  Advised to call the office or go to the ER if she develops worsneing abdominal pain, any new onset of bleeding / black stools or  fresh red blood from any orifice  Pt verbalized understanding of such.  Consider CT abdomen/ pelvis if doenst feel better. Will need to fu with pcp in 1-2 weeks or sooner if she isnt better.  Daniyla Pfahler

## 2020-10-17 NOTE — Telephone Encounter (Signed)
Pt already has apt for 10/23/2020

## 2020-10-17 NOTE — Telephone Encounter (Signed)
Looks like you are dealing with this. Unclear why it came to me

## 2020-10-18 DIAGNOSIS — Z20822 Contact with and (suspected) exposure to covid-19: Secondary | ICD-10-CM | POA: Diagnosis not present

## 2020-10-21 DIAGNOSIS — H2513 Age-related nuclear cataract, bilateral: Secondary | ICD-10-CM | POA: Diagnosis not present

## 2020-10-23 ENCOUNTER — Other Ambulatory Visit: Payer: Self-pay

## 2020-10-23 ENCOUNTER — Encounter: Payer: Self-pay | Admitting: Family Medicine

## 2020-10-23 ENCOUNTER — Ambulatory Visit
Admission: RE | Admit: 2020-10-23 | Discharge: 2020-10-23 | Disposition: A | Discharge status: Home / Self Care | Payer: Medicare Other | Source: Home / Self Care | Attending: Family Medicine | Admitting: Family Medicine

## 2020-10-23 ENCOUNTER — Ambulatory Visit (INDEPENDENT_AMBULATORY_CARE_PROVIDER_SITE_OTHER): Payer: Medicare Other | Admitting: Family Medicine

## 2020-10-23 ENCOUNTER — Inpatient Hospital Stay: Admit: 2020-10-23 | Payer: Medicare Other

## 2020-10-23 ENCOUNTER — Ambulatory Visit
Admission: RE | Admit: 2020-10-23 | Discharge: 2020-10-23 | Disposition: A | Discharge status: Home / Self Care | Payer: Medicare Other | Source: Ambulatory Visit | Attending: Family Medicine | Admitting: Family Medicine

## 2020-10-23 VITALS — BP 128/74 | HR 70 | Temp 98.3°F | Wt 185.0 lb

## 2020-10-23 DIAGNOSIS — G9332 Myalgic encephalomyelitis/chronic fatigue syndrome: Secondary | ICD-10-CM

## 2020-10-23 DIAGNOSIS — R5382 Chronic fatigue, unspecified: Secondary | ICD-10-CM | POA: Insufficient documentation

## 2020-10-23 DIAGNOSIS — R5383 Other fatigue: Secondary | ICD-10-CM | POA: Diagnosis not present

## 2020-10-23 DIAGNOSIS — G894 Chronic pain syndrome: Secondary | ICD-10-CM | POA: Diagnosis not present

## 2020-10-23 DIAGNOSIS — R059 Cough, unspecified: Secondary | ICD-10-CM | POA: Diagnosis not present

## 2020-10-23 DIAGNOSIS — U099 Post covid-19 condition, unspecified: Secondary | ICD-10-CM

## 2020-10-23 DIAGNOSIS — M47817 Spondylosis without myelopathy or radiculopathy, lumbosacral region: Secondary | ICD-10-CM | POA: Diagnosis not present

## 2020-10-23 DIAGNOSIS — M47814 Spondylosis without myelopathy or radiculopathy, thoracic region: Secondary | ICD-10-CM | POA: Diagnosis not present

## 2020-10-23 DIAGNOSIS — R0602 Shortness of breath: Secondary | ICD-10-CM | POA: Diagnosis not present

## 2020-10-23 DIAGNOSIS — M47812 Spondylosis without myelopathy or radiculopathy, cervical region: Secondary | ICD-10-CM | POA: Diagnosis not present

## 2020-10-23 NOTE — Patient Instructions (Signed)
Vitamin D 5000IU daily Vitamin C- 1000mg  daily Zinc- as recommended

## 2020-10-23 NOTE — Progress Notes (Signed)
BP 128/74   Pulse 70   Temp 98.3 F (36.8 C)   Wt 185 lb (83.9 kg)   LMP  (LMP Unknown)   SpO2 96%   BMI 31.35 kg/m    Subjective:    Patient ID: Tamara Fleming, female    DOB: 1946-03-15, 75 y.o.   MRN: [id number]  HPI: Tamara Fleming is a 75 y.o. female  Chief Complaint  Patient presents with   lung recheck    Follow up    Ayanah presents today for follow up on her COVID. She notes that she is feeling a bit better. No fevers. Having a bit of a cough at random. Not keeping her awake. She also notes that she is still really tired.   Relevant past medical, surgical, family and social history reviewed and updated as indicated. Interim medical history since our last visit reviewed. Allergies and medications reviewed and updated.  Review of Systems  Constitutional:  Positive for fatigue. Negative for activity change, appetite change, chills, diaphoresis, fever and unexpected weight change.  HENT:  Positive for postnasal drip and sore throat. Negative for congestion, dental problem, drooling, ear discharge, ear pain, facial swelling, hearing loss, mouth sores, nosebleeds, rhinorrhea, sinus pressure, sinus pain, sneezing, tinnitus, trouble swallowing and voice change.   Respiratory:  Positive for cough and shortness of breath. Negative for apnea, choking, chest tightness, wheezing and stridor.   Cardiovascular: Negative.   Gastrointestinal:  Positive for constipation. Negative for abdominal distention, abdominal pain, anal bleeding, blood in stool, diarrhea, nausea, rectal pain and vomiting.  Skin: Negative.   Psychiatric/Behavioral: Negative.     Per HPI unless specifically indicated above     Objective:    BP 128/74   Pulse 70   Temp 98.3 F (36.8 C)   Wt 185 lb (83.9 kg)   LMP  (LMP Unknown)   SpO2 96%   BMI 31.35 kg/m   Wt Readings from Last 3 Encounters:  10/23/20 185 lb (83.9 kg)  09/30/20 186 lb (84.4 kg)  09/11/20 187 lb (84.8 kg)    Physical  Exam Vitals and nursing note reviewed.  Constitutional:      General: She is not in acute distress.    Appearance: Normal appearance. She is obese. She is not ill-appearing, toxic-appearing or diaphoretic.  HENT:     Head: Normocephalic and atraumatic.     Right Ear: External ear normal.     Left Ear: External ear normal.     Nose: Nose normal.     Mouth/Throat:     Mouth: Mucous membranes are moist.     Pharynx: Oropharynx is clear.  Eyes:     General: No scleral icterus.       Right eye: No discharge.        Left eye: No discharge.     Extraocular Movements: Extraocular movements intact.     Conjunctiva/sclera: Conjunctivae normal.     Pupils: Pupils are equal, round, and reactive to light.  Cardiovascular:     Rate and Rhythm: Normal rate and regular rhythm.     Pulses: Normal pulses.     Heart sounds: Normal heart sounds. No murmur heard.   No friction rub. No gallop.  Pulmonary:     Effort: Pulmonary effort is normal. No respiratory distress.     Breath sounds: Normal breath sounds. No stridor. No wheezing, rhonchi or rales.  Chest:     Chest wall: No tenderness.  Musculoskeletal:  General: Normal range of motion.     Cervical back: Normal range of motion and neck supple.  Skin:    General: Skin is warm and dry.     Capillary Refill: Capillary refill takes less than 2 seconds.     Coloration: Skin is not jaundiced or pale.     Findings: No bruising, erythema, lesion or rash.  Neurological:     General: No focal deficit present.     Mental Status: She is alert and oriented to person, place, and time. Mental status is at baseline.  Psychiatric:        Mood and Affect: Mood normal.        Behavior: Behavior normal.        Thought Content: Thought content normal.        Judgment: Judgment normal.    Results for orders placed or performed in visit on 10/23/20  CBC with Differential/Platelet  Result Value Ref Range   WBC 4.4 3.4 - 10.8 x10E3/uL   RBC 3.61 (L)  3.77 - 5.28 x10E6/uL   Hemoglobin 12.1 11.1 - 15.9 g/dL   Hematocrit 36.3 34.0 - 46.6 %   MCV 101 (H) 79 - 97 fL   MCH 33.5 (H) 26.6 - 33.0 pg   MCHC 33.3 31.5 - 35.7 g/dL   RDW 11.7 11.7 - 15.4 %   Platelets 140 (L) 150 - 450 x10E3/uL   Neutrophils 54 Not Estab. %   Lymphs 28 Not Estab. %   Monocytes 14 Not Estab. %   Eos 4 Not Estab. %   Basos 0 Not Estab. %   Neutrophils Absolute 2.4 1.4 - 7.0 x10E3/uL   Lymphocytes Absolute 1.2 0.7 - 3.1 x10E3/uL   Monocytes Absolute 0.6 0.1 - 0.9 x10E3/uL   EOS (ABSOLUTE) 0.2 0.0 - 0.4 x10E3/uL   Basophils Absolute 0.0 0.0 - 0.2 x10E3/uL   Immature Granulocytes 0 Not Estab. %   Immature Grans (Abs) 0.0 0.0 - 0.1 x10E3/uL  Comprehensive metabolic panel  Result Value Ref Range   Glucose 114 (H) 65 - 99 mg/dL   BUN 11 8 - 27 mg/dL   Creatinine, Ser 0.80 0.57 - 1.00 mg/dL   eGFR 77 >59 mL/min/1.73   BUN/Creatinine Ratio 14 12 - 28   Sodium 138 134 - 144 mmol/L   Potassium 4.7 3.5 - 5.2 mmol/L   Chloride 102 96 - 106 mmol/L   CO2 23 20 - 29 mmol/L   Calcium 9.6 8.7 - 10.3 mg/dL   Total Protein 6.7 6.0 - 8.5 g/dL   Albumin 4.1 3.7 - 4.7 g/dL   Globulin, Total 2.6 1.5 - 4.5 g/dL   Albumin/Globulin Ratio 1.6 1.2 - 2.2   Bilirubin Total 0.6 0.0 - 1.2 mg/dL   Alkaline Phosphatase 101 44 - 121 IU/L   AST 30 0 - 40 IU/L   ALT 32 0 - 32 IU/L  TSH  Result Value Ref Range   TSH 2.970 0.450 - 4.500 uIU/mL      Assessment & Plan:   Problem List Items Addressed This Visit   None Visit Diagnoses     Post-COVID chronic fatigue    -  Primary   Will check labs and CXR. Continue to monitor closely- may take some time to recover. Conitnue to monitor. Call with any concerns.    Relevant Orders   CBC with Differential/Platelet (Completed)   Comprehensive metabolic panel (Completed)   TSH (Completed)   DG Chest 2 View (Completed)  Follow up plan: Return 3-4 weeks.

## 2020-10-24 ENCOUNTER — Other Ambulatory Visit: Payer: Self-pay | Admitting: Family Medicine

## 2020-10-24 LAB — COMPREHENSIVE METABOLIC PANEL
ALT: 32 IU/L (ref 0–32)
AST: 30 IU/L (ref 0–40)
Albumin/Globulin Ratio: 1.6 (ref 1.2–2.2)
Albumin: 4.1 g/dL (ref 3.7–4.7)
Alkaline Phosphatase: 101 IU/L (ref 44–121)
BUN/Creatinine Ratio: 14 (ref 12–28)
BUN: 11 mg/dL (ref 8–27)
Bilirubin Total: 0.6 mg/dL (ref 0.0–1.2)
CO2: 23 mmol/L (ref 20–29)
Calcium: 9.6 mg/dL (ref 8.7–10.3)
Chloride: 102 mmol/L (ref 96–106)
Creatinine, Ser: 0.8 mg/dL (ref 0.57–1.00)
Globulin, Total: 2.6 g/dL (ref 1.5–4.5)
Glucose: 114 mg/dL — ABNORMAL HIGH (ref 65–99)
Potassium: 4.7 mmol/L (ref 3.5–5.2)
Sodium: 138 mmol/L (ref 134–144)
Total Protein: 6.7 g/dL (ref 6.0–8.5)
eGFR: 77 mL/min/{1.73_m2} (ref >59)

## 2020-10-24 LAB — CBC WITH DIFFERENTIAL/PLATELET
Basophils Absolute: 0 10*3/uL (ref 0.0–0.2)
Basos: 0 %
EOS (ABSOLUTE): 0.2 10*3/uL (ref 0.0–0.4)
Eos: 4 %
Hematocrit: 36.3 % (ref 34.0–46.6)
Hemoglobin: 12.1 g/dL (ref 11.1–15.9)
Immature Grans (Abs): 0 10*3/uL (ref 0.0–0.1)
Immature Granulocytes: 0 %
Lymphocytes Absolute: 1.2 10*3/uL (ref 0.7–3.1)
Lymphs: 28 %
MCH: 33.5 pg — ABNORMAL HIGH (ref 26.6–33.0)
MCHC: 33.3 g/dL (ref 31.5–35.7)
MCV: 101 fL — ABNORMAL HIGH (ref 79–97)
Monocytes Absolute: 0.6 10*3/uL (ref 0.1–0.9)
Monocytes: 14 %
Neutrophils Absolute: 2.4 10*3/uL (ref 1.4–7.0)
Neutrophils: 54 %
Platelets: 140 10*3/uL — ABNORMAL LOW (ref 150–450)
RBC: 3.61 x10E6/uL — ABNORMAL LOW (ref 3.77–5.28)
RDW: 11.7 % (ref 11.7–15.4)
WBC: 4.4 10*3/uL (ref 3.4–10.8)

## 2020-10-24 LAB — TSH: TSH: 2.97 u[IU]/mL (ref 0.450–4.500)

## 2020-10-24 MED ORDER — BUDESONIDE-FORMOTEROL FUMARATE 160-4.5 MCG/ACT IN AERO: 2.0000 | INHALATION_SPRAY | Freq: Two times a day (BID) | RESPIRATORY_TRACT | 3 refills | Status: DC

## 2020-11-20 ENCOUNTER — Ambulatory Visit: Payer: Medicare Other | Admitting: Family Medicine

## 2020-11-20 DIAGNOSIS — M25532 Pain in left wrist: Secondary | ICD-10-CM | POA: Diagnosis not present

## 2020-11-20 DIAGNOSIS — M47814 Spondylosis without myelopathy or radiculopathy, thoracic region: Secondary | ICD-10-CM | POA: Diagnosis not present

## 2020-11-20 DIAGNOSIS — M47817 Spondylosis without myelopathy or radiculopathy, lumbosacral region: Secondary | ICD-10-CM | POA: Diagnosis not present

## 2020-11-20 DIAGNOSIS — D1722 Benign lipomatous neoplasm of skin and subcutaneous tissue of left arm: Secondary | ICD-10-CM | POA: Diagnosis not present

## 2020-11-20 DIAGNOSIS — M79643 Pain in unspecified hand: Secondary | ICD-10-CM | POA: Diagnosis not present

## 2020-11-20 DIAGNOSIS — M1812 Unilateral primary osteoarthritis of first carpometacarpal joint, left hand: Secondary | ICD-10-CM | POA: Diagnosis not present

## 2020-11-20 DIAGNOSIS — G894 Chronic pain syndrome: Secondary | ICD-10-CM | POA: Diagnosis not present

## 2020-11-20 DIAGNOSIS — M654 Radial styloid tenosynovitis [de Quervain]: Secondary | ICD-10-CM | POA: Diagnosis not present

## 2020-11-21 DIAGNOSIS — I10 Essential (primary) hypertension: Secondary | ICD-10-CM | POA: Diagnosis not present

## 2020-11-21 DIAGNOSIS — R002 Palpitations: Secondary | ICD-10-CM | POA: Diagnosis not present

## 2020-11-21 DIAGNOSIS — I700 Atherosclerosis of aorta: Secondary | ICD-10-CM | POA: Diagnosis not present

## 2020-12-11 ENCOUNTER — Ambulatory Visit: Payer: Medicare Other | Admitting: Family Medicine

## 2020-12-18 DIAGNOSIS — M47812 Spondylosis without myelopathy or radiculopathy, cervical region: Secondary | ICD-10-CM | POA: Diagnosis not present

## 2020-12-18 DIAGNOSIS — M79643 Pain in unspecified hand: Secondary | ICD-10-CM | POA: Diagnosis not present

## 2020-12-18 DIAGNOSIS — G894 Chronic pain syndrome: Secondary | ICD-10-CM | POA: Diagnosis not present

## 2020-12-18 DIAGNOSIS — M47817 Spondylosis without myelopathy or radiculopathy, lumbosacral region: Secondary | ICD-10-CM | POA: Diagnosis not present

## 2020-12-23 ENCOUNTER — Ambulatory Visit (INDEPENDENT_AMBULATORY_CARE_PROVIDER_SITE_OTHER): Payer: Medicare Other

## 2020-12-23 DIAGNOSIS — Z0000 Encounter for general adult medical examination without abnormal findings: Secondary | ICD-10-CM | POA: Diagnosis not present

## 2020-12-23 DIAGNOSIS — Z13820 Encounter for screening for osteoporosis: Secondary | ICD-10-CM | POA: Diagnosis not present

## 2020-12-23 DIAGNOSIS — M160 Bilateral primary osteoarthritis of hip: Secondary | ICD-10-CM

## 2020-12-23 DIAGNOSIS — M8589 Other specified disorders of bone density and structure, multiple sites: Secondary | ICD-10-CM

## 2020-12-23 DIAGNOSIS — Z1231 Encounter for screening mammogram for malignant neoplasm of breast: Secondary | ICD-10-CM | POA: Diagnosis not present

## 2020-12-23 NOTE — Progress Notes (Signed)
Subjective:   Tamara Fleming is a 75 y.o. female who presents for Medicare Annual (Subsequent) preventive examination.  I connected with  Tamara Fleming on 12/23/20 by an audio only telemedicine application and verified that I am speaking with the correct person using two identifiers.   I discussed the limitations, risks, security and privacy concerns of performing an evaluation and management service by telephone and the availability of in person appointments. I also discussed with the patient that there may be a patient responsible charge related to this service. The patient expressed understanding and verbally consented to this telephonic visit.  Location of Patient: home Location of Provider: office  List any persons and their role that are participating in the visit with the patient.    Review of Systems    Defer to PCP.  Cardiac Risk Factors include: hypertension;family history of premature cardiovascular disease     Objective:    Today's Vitals   12/23/20 1702  PainSc: 4    There is no height or weight on file to calculate BMI.  Advanced Directives 12/23/2020 06/01/2019 01/08/2018 11/17/2017 06/07/2017 04/15/2017 04/02/2017  Does Patient Have a Medical Advance Directive? No No No No No No No  Would patient like information on creating a medical advance directive? No - Patient declined - No - Patient declined Yes (MAU/Ambulatory/Procedural Areas - Information given) No - Patient declined No - Patient declined No - Patient declined    Current Medications (verified) Outpatient Encounter Medications as of 12/23/2020  Medication Sig   albuterol (VENTOLIN HFA) 108 (90 Base) MCG/ACT inhaler Inhale 2 puffs into the lungs every 6 (six) hours as needed for wheezing or shortness of breath.   aspirin EC 81 MG tablet Take 81 mg by mouth daily.   atorvastatin (LIPITOR) 10 MG tablet Take 1 tablet (10 mg total) by mouth at bedtime.   budesonide-formoterol (SYMBICORT) 160-4.5 MCG/ACT  inhaler Inhale 2 puffs into the lungs 2 (two) times daily.   clobetasol (TEMOVATE) 0.05 % external solution Apply 1 application topically 2 (two) times daily. Apply to affected areas on scalp daily for 2 weeks then decrease daily 5 times a week   HYDROcodone-acetaminophen (NORCO/VICODIN) 5-325 MG tablet Take 1 tablet by mouth 3 (three) times daily as needed for moderate pain.   losartan (COZAAR) 50 MG tablet Take 1 tablet (50 mg total) by mouth daily.   omeprazole (PRILOSEC OTC) 20 MG tablet Take 1 tablet (20 mg total) by mouth daily.   spironolactone (ALDACTONE) 25 MG tablet Take 1 tablet (25 mg total) by mouth daily.   linaclotide (LINZESS) 145 MCG CAPS capsule Take 1 capsule (145 mcg total) by mouth daily before breakfast.   Facility-Administered Encounter Medications as of 12/23/2020  Medication   cyanocobalamin ((VITAMIN B-12)) injection 1,000 mcg    Allergies (verified) Ciprofloxacin, Floxin [ofloxacin], and Sulfa antibiotics   History: Past Medical History:  Diagnosis Date   Arthritis of left hip 02/17/2017   Basal cell carcinoma 10/30/2019   L chin. Superficial.   Carpal tunnel syndrome 05/09/2014   Right   Chest pain    Chest pain with high risk for cardiac etiology 10/20/2010   Chronic low back pain    Complication of anesthesia    small mouth   Diverticulitis 02/17/2017   Fatigue    GERD (gastroesophageal reflux disease)    occ   Gout    Heart murmur    Hiatal hernia    Hip pain    Left  Hyperlipidemia    Hypertension    Low back pain (secondary) bilateral (right greater than left) 09/30/2016   OA (osteoarthritis)    Peripheral vascular disease ([place])    plhebitis 20 yrs ago   Piriformis syndrome of right side 02/17/2017   Primary osteoarthritis of first carpometacarpal joint of left hand 09/10/2016   Primary osteoarthritis of left hip 03/23/2017   Past Surgical History:  Procedure Laterality Date   ABDOMINAL HYSTERECTOMY  1982   Partial   APPENDECTOMY     ARM  SURGERY Left    OF THE ULNA NERVE   BREAST EXCISIONAL BIOPSY Right    CARDIOVASCULAR STRESS TEST  08/11/2006   NORMAL. EF 55-60%   CARDIOVASCULAR STRESS TEST  10/31/1998   EF 60% AT REST TO 75-80% POST EXERCISE   CARPAL TUNNEL RELEASE Right 2015   ESOPHAGOGASTRODUODENOSCOPY  05/16/1990   ESOPHAGOGASTRODUODENOSCOPY (EGD) WITH PROPOFOL N/A 02/04/2015   Procedure: ESOPHAGOGASTRODUODENOSCOPY (EGD) WITH PROPOFOL;  Surgeon: Hulen Luster, MD;  Location[facility] ENDOSCOPY;  Service: Gastroenterology;  Laterality: N/A;   JOINT REPLACEMENT     REDUCTION MAMMAPLASTY  06/11/15   TOTAL HIP ARTHROPLASTY Left 04/13/2017   Procedure: TOTAL LEFT HIP ARTHROPLASTY ANTERIOR APPROACH;  Surgeon: Renette Butters, MD;  Location: [facility];  Service: Orthopedics;  Laterality: Left;   Family History  Problem Relation Age of Onset   Coronary artery disease Mother    Cancer Father        Lung   Social History   Socioeconomic History   Marital status: Married    Spouse name: Not on file   Number of children: Not on file   Years of education: 20, 1 year college    Highest education level: Some college, no degree  Occupational History   Occupation: self employed   Tobacco Use   Smoking status: Never   Smokeless tobacco: Never  Vaping Use   Vaping Use: Never used  Substance and Sexual Activity   Alcohol use: Yes    Alcohol/week: 14.0 standard drinks    Types: 14 Glasses of wine per week    Comment: wine, 1-2 daily   Drug use: No   Sexual activity: Not Currently    Birth control/protection: None  Other Topics Concern   Not on file  Social History Narrative   Self employed   Social Determinants of Health   Financial Resource Strain: Low Risk    Difficulty of Paying Living Expenses: Not hard at all  Food Insecurity: No Food Insecurity   Worried About Charity fundraiser in the Last Year: Never true   Arboriculturist in the Last Year: Never true  Transportation Needs: No Transportation Needs   Lack of  Transportation (Medical): No   Lack of Transportation (Non-Medical): No  Physical Activity: Insufficiently Active   Days of Exercise per Week: 2 days   Minutes of Exercise per Session: 20 min  Stress: No Stress Concern Present   Feeling of Stress : Not at all  Social Connections: Moderately Integrated   Frequency of Communication with Friends and Family: More than three times a week   Frequency of Social Gatherings with Friends and Family: More than three times a week   Attends Religious Services: More than 4 times per year   Active Member of [employer] or Organizations: No   Attends Archivist Meetings: Never   Marital Status: Married    Tobacco Counseling Counseling given: Not Answered   Clinical Intake:  Airline pilot  completed: Yes  Pain : 0-10 Pain Score: 4  Pain Type: Chronic pain Pain Location: Wrist Pain Onset: More than a month ago     Nutritional Risks: None Diabetes: No  How often do you need to have someone help you when you read instructions, pamphlets, or other written materials from your doctor or pharmacy?: 1 - Never What is the last grade level you completed in school?: 1 year of college  Diabetic?No  Interpreter Needed?: No      Activities of Daily Living In your present state of health, do you have any difficulty performing the following activities: 12/23/2020  Hearing? Y  Vision? N  Difficulty concentrating or making decisions? N  Walking or climbing stairs? N  Dressing or bathing? N  Doing errands, shopping? N  Preparing Food and eating ? N  Using the Toilet? N  In the past six months, have you accidently leaked urine? N  Do you have problems with loss of bowel control? N  Managing your Medications? N  Managing your Finances? N  Housekeeping or managing your Housekeeping? N  Some recent data might be hidden    Patient Care Team: Valerie Roys, DO as PCP - General (Family Medicine) Almedia Balls, MD as Consulting  Physician (Orthopedic Surgery) Isaias Cowman, MD as Consulting Physician (Cardiology)  Indicate any recent [place] you may have received from other than [facility] providers in the past year (date may be approximate).     Assessment:   This is a routine wellness examination for [place].  Hearing/Vision screen No results found.  Dietary issues and exercise activities discussed: Current Exercise Habits: Home exercise routine, Type of exercise: walking, Time (Minutes): 20, Frequency (Times/Week): 2, Weekly Exercise (Minutes/Week): 40, Intensity: Mild, Exercise limited by: orthopedic condition(s)   Goals Addressed   None   Depression Screen PHQ 2/9 Scores 12/23/2020 07/01/2020 12/28/2019 06/01/2019 12/20/2017 11/17/2017 07/13/2017  PHQ - 2 Score 0 0 0 0 0 0 0  PHQ- 9 Score - - 2 - '4 2 1    '$ Fall Risk Fall Risk  12/23/2020 07/01/2020 06/01/2019 12/20/2017 11/17/2017  Falls in the past year? 0 1 0 No Yes  Number falls in past yr: 0 0 0 - 2 or more  Comment - - - - -  Injury with Fall? 0 0 0 - Yes  Risk Factor Category  - - - - High Fall Risk  Risk for fall due to : No Fall Risks History of fall(s) - - -  Risk for fall due to: Comment - - - - -  Follow up Falls evaluation completed - - - Falls prevention discussed;Falls evaluation completed    FALL RISK PREVENTION PERTAINING TO THE HOME:  Any stairs in or around the home? Yes  If so, are there any without handrails? No  Home free of loose throw rugs in walkways, pet beds, electrical cords, etc? Yes  Adequate lighting in your home to reduce risk of falls? Yes   ASSISTIVE DEVICES UTILIZED TO PREVENT FALLS:  Life alert? No  Use of a cane, walker or w/c? No  Grab bars in the bathroom? No  Shower chair or bench in shower? Yes  Elevated toilet seat or a handicapped toilet? Yes   TIMED UP AND GO:  Was the test performed?  N/A .  Length of time to ambulate 10 feet: N/A sec.     Cognitive Function:     6CIT Screen 12/23/2020  11/17/2017  What Year? 0 points 0  points  What month? 0 points 0 points  What time? 0 points 0 points  Count back from 20 0 points 0 points  Months in reverse 0 points 0 points  Repeat phrase 0 points 0 points  Total Score 0 0    Immunizations Immunization History  Administered Date(s) Administered   Fluad Quad(high Dose 65+) 12/20/2018, 01/29/2020   Influenza, High Dose Seasonal PF 01/21/2016, 01/20/2017, 03/15/2018   Influenza-Unspecified 02/27/2015   PFIZER(Purple Top)SARS-COV-2 Vaccination 05/19/2019, 06/13/2019   Pneumococcal Conjugate-13 11/17/2013   Pneumococcal Polysaccharide-23 05/16/2014    TDAP status: Due, Education has been provided regarding the importance of this vaccine. Advised may receive this vaccine at local pharmacy or Health Dept. Aware to provide a copy of the vaccination record if obtained from local pharmacy or Health Dept. Verbalized acceptance and understanding.  Flu Vaccine status: Due, Education has been provided regarding the importance of this vaccine. Advised may receive this vaccine at local pharmacy or Health Dept. Aware to provide a copy of the vaccination record if obtained from local pharmacy or Health Dept. Verbalized acceptance and understanding.  Pneumococcal vaccine status: Up to date  Covid-19 vaccine status: Information provided on how to obtain vaccines.   Qualifies for Shingles Vaccine? Yes   Zostavax completed No   Shingrix Completed?: No.    Education has been provided regarding the importance of this vaccine. Patient has been advised to call insurance company to determine out of pocket expense if they have not yet received this vaccine. Advised may also receive vaccine at local pharmacy or Health Dept. Verbalized acceptance and understanding.  Screening Tests Health Maintenance  Topic Date Due   TETANUS/TDAP  Never done   Zoster Vaccines- Shingrix (1 of 2) Never done   COVID-19 Vaccine (3 - Pfizer risk series) 07/11/2019    MAMMOGRAM  08/02/2019   INFLUENZA VACCINE  11/04/2020   COLONOSCOPY (Pts 45-59yr Insurance coverage will need to be confirmed)  04/15/2023   DEXA SCAN  Addressed   Hepatitis C Screening  Addressed   HPV VACCINES  Aged Out    Health Maintenance  Health Maintenance Due  Topic Date Due   TETANUS/TDAP  Never done   Zoster Vaccines- Shingrix (1 of 2) Never done   COVID-19 Vaccine (3 - Pfizer risk series) 07/11/2019   MAMMOGRAM  08/02/2019   INFLUENZA VACCINE  11/04/2020    Colorectal cancer screening: Type of screening: Colonoscopy. Completed 04/14/13. Repeat every 10 years  Mammogram status: Ordered 12/23/20. Pt provided with contact info and advised to call to schedule appt.   Bone Density status: Ordered 12/23/20. Pt provided with contact info and advised to call to schedule appt.  Lung Cancer Screening: (Low Dose CT Chest recommended if Age 74-80years, 30 pack-year currently smoking OR have quit w/in 15years.) does not qualify.   Lung Cancer Screening Referral: N/A  Additional Screening:  Hepatitis C Screening: does qualify; Completed 01/29/15  Vision Screening: Recommended annual ophthalmology exams for early detection of glaucoma and other disorders of the eye. Is the patient up to date with their annual eye exam?  Yes  Who is the provider or what is the name of the [facility] in which the patient attends annual [facility] exams[facility] not [facility] with a provider, would they like to be referred to a provider to establish care?  N/A .   Dental Screening: Recommended annual dental exams for proper oral hygiene  Community Resource Referral / Chronic Care Management: CRR required this  visit?  No   CCM required this visit?  No      Plan:     I have personally reviewed and noted the following in the patient's chart:   Medical and social history Use of alcohol, tobacco or illicit drugs  Current medications and supplements including opioid prescriptions.   Functional ability and status Nutritional status Physical activity Advanced directives List of other physicians Hospitalizations, surgeries, and ER visits in previous 12 months Vitals Screenings to include cognitive, depression, and falls Referrals and appointments  In addition, I have reviewed and discussed with patient certain preventive protocols, quality metrics, and best practice recommendations. A written personalized care plan for preventive services as well as general preventive health recommendations were provided to patient.   Ms. Pompeo , Thank you for taking time to come for your Medicare Wellness Visit. I appreciate your ongoing commitment to your health goals. Please review the following plan we discussed and let me know if I can assist you in the future.   These are the goals we discussed:  Goals      DIET - INCREASE WATER INTAKE     Recommend drinking at least 6-8 glasses of water a day         This is a list of the screening recommended for you and due dates:  Health Maintenance  Topic Date Due   Tetanus Vaccine  Never done   Zoster (Shingles) Vaccine (1 of 2) Never done   COVID-19 Vaccine (3 - Pfizer risk series) 07/11/2019   Mammogram  08/02/2019   Flu Shot  11/04/2020   Colon Cancer Screening  04/15/2023   DEXA scan (bone density measurement)  Addressed   Hepatitis C Screening: USPSTF Recommendation to screen - Ages 70-79 yo.  Addressed   HPV Vaccine  Aged Out        [place], [place]   12/23/2020   Nurse Notes: Non Face to Face 60 Minutes

## 2020-12-23 NOTE — Patient Instructions (Signed)
Health Maintenance, Female Adopting a healthy lifestyle and getting preventive care are important in promoting health and wellness. Ask your health care provider about: The right schedule for you to have regular tests and exams. Things you can do on your own to prevent diseases and keep yourself healthy. What should I know about diet, weight, and exercise? Eat a healthy diet  Eat a diet that includes plenty of vegetables, fruits, low-fat dairy products, and lean protein. Do not eat a lot of foods that are high in solid fats, added sugars, or sodium. Maintain a healthy weight Body mass index (BMI) is used to identify weight problems. It estimates body fat based on height and weight. Your health care provider can help determine your BMI and help you achieve or maintain a healthy weight. Get regular exercise Get regular exercise. This is one of the most important things you can do for your health. Most adults should: Exercise for at least 150 minutes each week. The exercise should increase your heart rate and make you sweat (moderate-intensity exercise). Do strengthening exercises at least twice a week. This is in addition to the moderate-intensity exercise. Spend less time sitting. Even light physical activity can be beneficial. Watch cholesterol and blood lipids Have your blood tested for lipids and cholesterol at 74 years of age, then have this test every 5 years. Have your cholesterol levels checked more often if: Your lipid or cholesterol levels are high. You are older than 74 years of age. You are at high risk for heart disease. What should I know about cancer screening? Depending on your health history and family history, you may need to have cancer screening at various ages. This may include screening for: Breast cancer. Cervical cancer. Colorectal cancer. Skin cancer. Lung cancer. What should I know about heart disease, diabetes, and high blood pressure? Blood pressure and heart  disease High blood pressure causes heart disease and increases the risk of stroke. This is more likely to develop in people who have high blood pressure readings, are of African descent, or are overweight. Have your blood pressure checked: Every 3-5 years if you are 18-39 years of age. Every year if you are 40 years old or older. Diabetes Have regular diabetes screenings. This checks your fasting blood sugar level. Have the screening done: Once every three years after age 40 if you are at a normal weight and have a low risk for diabetes. More often and at a younger age if you are overweight or have a high risk for diabetes. What should I know about preventing infection? Hepatitis B If you have a higher risk for hepatitis B, you should be screened for this virus. Talk with your health care provider to find out if you are at risk for hepatitis B infection. Hepatitis C Testing is recommended for: Everyone born from 1945 through 1965. Anyone with known risk factors for hepatitis C. Sexually transmitted infections (STIs) Get screened for STIs, including gonorrhea and chlamydia, if: You are sexually active and are younger than 74 years of age. You are older than 74 years of age and your health care provider tells you that you are at risk for this type of infection. Your sexual activity has changed since you were last screened, and you are at increased risk for chlamydia or gonorrhea. Ask your health care provider if you are at risk. Ask your health care provider about whether you are at high risk for HIV. Your health care provider may recommend a prescription medicine   to help prevent HIV infection. If you choose to take medicine to prevent HIV, you should first get tested for HIV. You should then be tested every 3 months for as long as you are taking the medicine. Pregnancy If you are about to stop having your period (premenopausal) and you may become pregnant, seek counseling before you get  pregnant. Take 400 to 800 micrograms (mcg) of folic acid every day if you become pregnant. Ask for birth control (contraception) if you want to prevent pregnancy. Osteoporosis and menopause Osteoporosis is a disease in which the bones lose minerals and strength with aging. This can result in bone fractures. If you are 65 years old or older, or if you are at risk for osteoporosis and fractures, ask your health care provider if you should: Be screened for bone loss. Take a calcium or vitamin D supplement to lower your risk of fractures. Be given hormone replacement therapy (HRT) to treat symptoms of menopause. Follow these instructions at home: Lifestyle Do not use any products that contain nicotine or tobacco, such as cigarettes, e-cigarettes, and chewing tobacco. If you need help quitting, ask your health care provider. Do not use street drugs. Do not share needles. Ask your health care provider for help if you need support or information about quitting drugs. Alcohol use Do not drink alcohol if: Your health care provider tells you not to drink. You are pregnant, may be pregnant, or are planning to become pregnant. If you drink alcohol: Limit how much you use to 0-1 drink a day. Limit intake if you are breastfeeding. Be aware of how much alcohol is in your drink. In the [place] one drink equals one 12 oz bottle of beer (355 mL), one 5 oz glass of wine (148 mL), or one 1 oz glass of hard liquor (44 mL). General instructions Schedule regular health, dental, and eye exams. Stay current with your vaccines. Tell your health care provider if: You often feel depressed. You have ever been abused or do not feel safe at home. Summary Adopting a healthy lifestyle and getting preventive care are important in promoting health and wellness. Follow your health care provider's instructions about healthy diet, exercising, and getting tested or screened for diseases. Follow your health care provider's  instructions on monitoring your cholesterol and blood pressure. This information is not intended to replace advice given to you by your health care provider. Make sure you discuss any questions you have with your health care provider. Document Revised: 05/31/2020 Document Reviewed: 03/16/2018 Elsevier Patient Education  2022 [employer].  

## 2021-01-20 ENCOUNTER — Other Ambulatory Visit: Payer: Self-pay

## 2021-01-20 ENCOUNTER — Ambulatory Visit (INDEPENDENT_AMBULATORY_CARE_PROVIDER_SITE_OTHER): Payer: Medicare Other

## 2021-01-20 DIAGNOSIS — Z23 Encounter for immunization: Secondary | ICD-10-CM

## 2021-01-20 DIAGNOSIS — G894 Chronic pain syndrome: Secondary | ICD-10-CM | POA: Diagnosis not present

## 2021-01-20 DIAGNOSIS — M47817 Spondylosis without myelopathy or radiculopathy, lumbosacral region: Secondary | ICD-10-CM | POA: Diagnosis not present

## 2021-01-20 DIAGNOSIS — M79643 Pain in unspecified hand: Secondary | ICD-10-CM | POA: Diagnosis not present

## 2021-01-20 DIAGNOSIS — M47814 Spondylosis without myelopathy or radiculopathy, thoracic region: Secondary | ICD-10-CM | POA: Diagnosis not present

## 2021-02-18 DIAGNOSIS — Z79891 Long term (current) use of opiate analgesic: Secondary | ICD-10-CM | POA: Diagnosis not present

## 2021-02-18 DIAGNOSIS — G894 Chronic pain syndrome: Secondary | ICD-10-CM | POA: Diagnosis not present

## 2021-02-18 DIAGNOSIS — M47812 Spondylosis without myelopathy or radiculopathy, cervical region: Secondary | ICD-10-CM | POA: Diagnosis not present

## 2021-02-18 DIAGNOSIS — M47817 Spondylosis without myelopathy or radiculopathy, lumbosacral region: Secondary | ICD-10-CM | POA: Diagnosis not present

## 2021-02-18 DIAGNOSIS — M47814 Spondylosis without myelopathy or radiculopathy, thoracic region: Secondary | ICD-10-CM | POA: Diagnosis not present

## 2021-02-18 DIAGNOSIS — Z79899 Other long term (current) drug therapy: Secondary | ICD-10-CM | POA: Diagnosis not present

## 2021-02-20 ENCOUNTER — Telehealth: Payer: Self-pay | Admitting: Family Medicine

## 2021-02-20 NOTE — Telephone Encounter (Signed)
An appointment has been made.

## 2021-02-20 NOTE — Telephone Encounter (Signed)
Pt walked in for medication refills for Losartan Potassium and Spironolactone sent to [facility].

## 2021-02-20 NOTE — Telephone Encounter (Signed)
Needs appt

## 2021-02-21 ENCOUNTER — Ambulatory Visit (INDEPENDENT_AMBULATORY_CARE_PROVIDER_SITE_OTHER): Payer: Medicare Other | Admitting: Family Medicine

## 2021-02-21 ENCOUNTER — Encounter: Payer: Self-pay | Admitting: Family Medicine

## 2021-02-21 ENCOUNTER — Other Ambulatory Visit: Payer: Self-pay

## 2021-02-21 VITALS — BP 134/80 | HR 73 | Temp 98.5°F | Ht 65.0 in | Wt 189.9 lb

## 2021-02-21 DIAGNOSIS — R7989 Other specified abnormal findings of blood chemistry: Secondary | ICD-10-CM

## 2021-02-21 DIAGNOSIS — E538 Deficiency of other specified B group vitamins: Secondary | ICD-10-CM

## 2021-02-21 DIAGNOSIS — Z1329 Encounter for screening for other suspected endocrine disorder: Secondary | ICD-10-CM

## 2021-02-21 DIAGNOSIS — M1A9XX0 Chronic gout, unspecified, without tophus (tophi): Secondary | ICD-10-CM | POA: Diagnosis not present

## 2021-02-21 DIAGNOSIS — I700 Atherosclerosis of aorta: Secondary | ICD-10-CM | POA: Diagnosis not present

## 2021-02-21 DIAGNOSIS — I10 Essential (primary) hypertension: Secondary | ICD-10-CM | POA: Diagnosis not present

## 2021-02-21 DIAGNOSIS — K5909 Other constipation: Secondary | ICD-10-CM | POA: Diagnosis not present

## 2021-02-21 DIAGNOSIS — E559 Vitamin D deficiency, unspecified: Secondary | ICD-10-CM | POA: Diagnosis not present

## 2021-02-21 DIAGNOSIS — R7301 Impaired fasting glucose: Secondary | ICD-10-CM

## 2021-02-21 DIAGNOSIS — E7800 Pure hypercholesterolemia, unspecified: Secondary | ICD-10-CM | POA: Diagnosis not present

## 2021-02-21 LAB — BAYER DCA HB A1C WAIVED: HB A1C (BAYER DCA - WAIVED): 5.9 % — ABNORMAL HIGH (ref 4.8–5.6)

## 2021-02-21 MED ORDER — LINACLOTIDE 145 MCG PO CAPS: 145.0000 ug | ORAL_CAPSULE | Freq: Every day | ORAL | 1 refills | Status: DC

## 2021-02-21 MED ORDER — OMEPRAZOLE MAGNESIUM 20 MG PO TBEC: 20.0000 mg | DELAYED_RELEASE_TABLET | Freq: Every day | ORAL | 1 refills | Status: DC

## 2021-02-21 MED ORDER — ALBUTEROL SULFATE HFA 108 (90 BASE) MCG/ACT IN AERS: 2.0000 | INHALATION_SPRAY | Freq: Four times a day (QID) | RESPIRATORY_TRACT | 12 refills | Status: DC | PRN

## 2021-02-21 MED ORDER — BUDESONIDE-FORMOTEROL FUMARATE 160-4.5 MCG/ACT IN AERO: 2.0000 | INHALATION_SPRAY | Freq: Two times a day (BID) | RESPIRATORY_TRACT | 3 refills | Status: DC

## 2021-02-21 MED ORDER — LOSARTAN POTASSIUM 50 MG PO TABS: 50.0000 mg | ORAL_TABLET | Freq: Every day | ORAL | 1 refills | Status: DC

## 2021-02-21 MED ORDER — SPIRONOLACTONE 25 MG PO TABS: 25.0000 mg | ORAL_TABLET | Freq: Every day | ORAL | 1 refills | Status: DC

## 2021-02-21 MED ORDER — ATORVASTATIN CALCIUM 10 MG PO TABS: 10.0000 mg | ORAL_TABLET | Freq: Every day | ORAL | 1 refills | Status: DC

## 2021-02-21 NOTE — Assessment & Plan Note (Signed)
Rechecking labs today. Await results. Treat as needed.  °

## 2021-02-21 NOTE — Progress Notes (Signed)
BP 134/80   Pulse 73   Temp 98.5 F (36.9 C)   Ht '5\' 5"'  (1.651 m)   Wt 189 lb 14.4 oz (86.1 kg)   LMP  (LMP Unknown)   SpO2 98%   BMI 31.60 kg/m    Subjective:    Patient ID: Tamara Fleming, female    DOB: [birth year], 75 y.o.   MRN: [id number]  HPI: Tamara Fleming is a 75 y.o. female  Chief Complaint  Patient presents with   Hypertension   Hyperlipidemia   IFG   HYPERTENSION / HYPERLIPIDEMIA Satisfied with current treatment? yes Duration of hypertension: chronic BP monitoring frequency: not checking BP medication side effects: no Past BP meds: spironalactone, losartan Duration of hyperlipidemia: chronic Cholesterol medication side effects: no Cholesterol supplements: none Past cholesterol medications: atorvastatin Medication compliance: excellent compliance Aspirin: yes Recent stressors: no Recurrent headaches: no Visual changes: no Palpitations: no Dyspnea: no Chest pain: no Lower extremity edema: no Dizzy/lightheaded: no  Impaired Fasting Glucose HbA1C:  Lab Results  Component Value Date   HGBA1C 5.8 12/28/2019   Duration of elevated blood sugar:  Polydipsia: no Polyuria: no Weight change: no Visual disturbance: no Glucose Monitoring: no Diabetic Education: Completed Family history of diabetes: no  No gout flares. Feeling well. Tolerating medicine well.  Relevant past medical, surgical, family and social history reviewed and updated as indicated. Interim medical history since our last visit reviewed. Allergies and medications reviewed and updated.  Review of Systems  Constitutional: Negative.   Respiratory: Negative.    Cardiovascular: Negative.   Gastrointestinal: Negative.   Musculoskeletal: Negative.   Neurological: Negative.   Psychiatric/Behavioral: Negative.     Per HPI unless specifically indicated above     Objective:    BP 134/80   Pulse 73   Temp 98.5 F (36.9 C)   Ht '5\' 5"'  (1.651 m)   Wt 189 lb 14.4 oz (86.1 kg)    LMP  (LMP Unknown)   SpO2 98%   BMI 31.60 kg/m   Wt Readings from Last 3 Encounters:  02/21/21 189 lb 14.4 oz (86.1 kg)  10/23/20 185 lb (83.9 kg)  09/30/20 186 lb (84.4 kg)    Physical Exam Vitals and nursing note reviewed.  Constitutional:      General: She is not in acute distress.    Appearance: Normal appearance. She is not ill-appearing, toxic-appearing or diaphoretic.  HENT:     Head: Normocephalic and atraumatic.     Right Ear: External ear normal.     Left Ear: External ear normal.     Nose: Nose normal.     Mouth/Throat:     Mouth: Mucous membranes are moist.     Pharynx: Oropharynx is clear.  Eyes:     General: No scleral icterus.       Right eye: No discharge.        Left eye: No discharge.     Extraocular Movements: Extraocular movements intact.     Conjunctiva/sclera: Conjunctivae normal.     Pupils: Pupils are equal, round, and reactive to light.  Cardiovascular:     Rate and Rhythm: Normal rate and regular rhythm.     Pulses: Normal pulses.     Heart sounds: Normal heart sounds. No murmur heard.   No friction rub. No gallop.  Pulmonary:     Effort: Pulmonary effort is normal. No respiratory distress.     Breath sounds: Normal breath sounds. No stridor. No wheezing, rhonchi or rales.  Chest:  Chest wall: No tenderness.  Musculoskeletal:        General: Normal range of motion.     Cervical back: Normal range of motion and neck supple.  Skin:    General: Skin is warm and dry.     Capillary Refill: Capillary refill takes less than 2 seconds.     Coloration: Skin is not jaundiced or pale.     Findings: No bruising, erythema, lesion or rash.  Neurological:     General: No focal deficit present.     Mental Status: She is alert and oriented to person, place, and time. Mental status is at baseline.  Psychiatric:        Mood and Affect: Mood normal.        Behavior: Behavior normal.        Thought Content: Thought content normal.        Judgment:  Judgment normal.    Results for orders placed or performed in visit on 10/23/20  CBC with Differential/Platelet  Result Value Ref Range   WBC 4.4 3.4 - 10.8 x10E3/uL   RBC 3.61 (L) 3.77 - 5.28 x10E6/uL   Hemoglobin 12.1 11.1 - 15.9 g/dL   Hematocrit 36.3 34.0 - 46.6 %   MCV 101 (H) 79 - 97 fL   MCH 33.5 (H) 26.6 - 33.0 pg   MCHC 33.3 31.5 - 35.7 g/dL   RDW 11.7 11.7 - 15.4 %   Platelets 140 (L) 150 - 450 x10E3/uL   Neutrophils 54 Not Estab. %   Lymphs 28 Not Estab. %   Monocytes 14 Not Estab. %   Eos 4 Not Estab. %   Basos 0 Not Estab. %   Neutrophils Absolute 2.4 1.4 - 7.0 x10E3/uL   Lymphocytes Absolute 1.2 0.7 - 3.1 x10E3/uL   Monocytes Absolute 0.6 0.1 - 0.9 x10E3/uL   EOS (ABSOLUTE) 0.2 0.0 - 0.4 x10E3/uL   Basophils Absolute 0.0 0.0 - 0.2 x10E3/uL   Immature Granulocytes 0 Not Estab. %   Immature Grans (Abs) 0.0 0.0 - 0.1 x10E3/uL  Comprehensive metabolic panel  Result Value Ref Range   Glucose 114 (H) 65 - 99 mg/dL   BUN 11 8 - 27 mg/dL   Creatinine, Ser 0.80 0.57 - 1.00 mg/dL   eGFR 77 >59 mL/min/1.73   BUN/Creatinine Ratio 14 12 - 28   Sodium 138 134 - 144 mmol/L   Potassium 4.7 3.5 - 5.2 mmol/L   Chloride 102 96 - 106 mmol/L   CO2 23 20 - 29 mmol/L   Calcium 9.6 8.7 - 10.3 mg/dL   Total Protein 6.7 6.0 - 8.5 g/dL   Albumin 4.1 3.7 - 4.7 g/dL   Globulin, Total 2.6 1.5 - 4.5 g/dL   Albumin/Globulin Ratio 1.6 1.2 - 2.2   Bilirubin Total 0.6 0.0 - 1.2 mg/dL   Alkaline Phosphatase 101 44 - 121 IU/L   AST 30 0 - 40 IU/L   ALT 32 0 - 32 IU/L  TSH  Result Value Ref Range   TSH 2.970 0.450 - 4.500 uIU/mL      Assessment & Plan:   Problem List Items Addressed This Visit       Cardiovascular and Mediastinum   Essential hypertension - Primary    Under good control on current regimen. Continue current regimen. Continue to monitor. Call with any concerns. Refills given. Labs drawn today.       Relevant Medications   atorvastatin (LIPITOR) 10 MG tablet    losartan (COZAAR) 50  MG tablet   spironolactone (ALDACTONE) 25 MG tablet   Other Relevant Orders   CBC with Differential/Platelet   Comprehensive metabolic panel   Aortic atherosclerosis (HCC)    Will keep BP and cholesterol under good control. Continue to monitor.       Relevant Medications   atorvastatin (LIPITOR) 10 MG tablet   losartan (COZAAR) 50 MG tablet   spironolactone (ALDACTONE) 25 MG tablet     Endocrine   IFG (impaired fasting glucose)    Rechecking labs today. Await results. Treat as needed.       Relevant Orders   CBC with Differential/Platelet   Comprehensive metabolic panel   Bayer DCA Hb A1c Waived     Other   Pure hypercholesterolemia    Under good control on current regimen. Continue current regimen. Continue to monitor. Call with any concerns. Refills given. Labs drawn today.       Relevant Medications   atorvastatin (LIPITOR) 10 MG tablet   losartan (COZAAR) 50 MG tablet   spironolactone (ALDACTONE) 25 MG tablet   Other Relevant Orders   CBC with Differential/Platelet   Comprehensive metabolic panel   Lipid Panel w/o Chol/HDL Ratio   B12 deficiency    Rechecking labs today. Await results. Treat as needed.       Relevant Orders   CBC with Differential/Platelet   Comprehensive metabolic panel   Vitamin D deficiency    Rechecking labs today. Await results. Treat as needed.       Relevant Orders   CBC with Differential/Platelet   Comprehensive metabolic panel   VITAMIN D 25 Hydroxy (Vit-D Deficiency, Fractures)   Elevated TSH    Rechecking labs today. Await results. Treat as needed.       Relevant Orders   CBC with Differential/Platelet   Comprehensive metabolic panel   Gout    Rechecking labs today. Await results. Treat as needed.       Relevant Orders   CBC with Differential/Platelet   Comprehensive metabolic panel   Uric acid   Other Visit Diagnoses     Screening for thyroid disorder       Relevant Orders   TSH   Other  constipation       Relevant Medications   linaclotide (LINZESS) 145 MCG CAPS capsule        Follow up plan: Return in about 6 months (around 08/21/2021).

## 2021-02-21 NOTE — Assessment & Plan Note (Signed)
Will keep BP and cholesterol under good control. Continue to monitor.  

## 2021-02-21 NOTE — Assessment & Plan Note (Signed)
Under good control on current regimen. Continue current regimen. Continue to monitor. Call with any concerns. Refills given. Labs drawn today.   

## 2021-02-21 NOTE — Patient Instructions (Addendum)
Please call to schedule your mammogram and/or bone density

## 2021-02-22 LAB — CBC WITH DIFFERENTIAL/PLATELET
Basophils Absolute: 0 10*3/uL (ref 0.0–0.2)
Basos: 1 %
EOS (ABSOLUTE): 0.1 10*3/uL (ref 0.0–0.4)
Eos: 3 %
Hematocrit: 38.8 % (ref 34.0–46.6)
Hemoglobin: 13.3 g/dL (ref 11.1–15.9)
Immature Grans (Abs): 0 10*3/uL (ref 0.0–0.1)
Immature Granulocytes: 0 %
Lymphocytes Absolute: 1.3 10*3/uL (ref 0.7–3.1)
Lymphs: 31 %
MCH: 34.5 pg — ABNORMAL HIGH (ref 26.6–33.0)
MCHC: 34.3 g/dL (ref 31.5–35.7)
MCV: 101 fL — ABNORMAL HIGH (ref 79–97)
Monocytes Absolute: 0.5 10*3/uL (ref 0.1–0.9)
Monocytes: 12 %
Neutrophils Absolute: 2.3 10*3/uL (ref 1.4–7.0)
Neutrophils: 53 %
Platelets: 141 10*3/uL — ABNORMAL LOW (ref 150–450)
RBC: 3.85 x10E6/uL (ref 3.77–5.28)
RDW: 11.8 % (ref 11.7–15.4)
WBC: 4.3 10*3/uL (ref 3.4–10.8)

## 2021-02-22 LAB — COMPREHENSIVE METABOLIC PANEL
ALT: 36 IU/L — ABNORMAL HIGH (ref 0–32)
AST: 37 IU/L (ref 0–40)
Albumin/Globulin Ratio: 2 (ref 1.2–2.2)
Albumin: 4.5 g/dL (ref 3.7–4.7)
Alkaline Phosphatase: 88 IU/L (ref 44–121)
BUN/Creatinine Ratio: 15 (ref 12–28)
BUN: 13 mg/dL (ref 8–27)
Bilirubin Total: 0.6 mg/dL (ref 0.0–1.2)
CO2: 22 mmol/L (ref 20–29)
Calcium: 9.5 mg/dL (ref 8.7–10.3)
Chloride: 100 mmol/L (ref 96–106)
Creatinine, Ser: 0.86 mg/dL (ref 0.57–1.00)
Globulin, Total: 2.2 g/dL (ref 1.5–4.5)
Glucose: 146 mg/dL — ABNORMAL HIGH (ref 70–99)
Potassium: 4.1 mmol/L (ref 3.5–5.2)
Sodium: 139 mmol/L (ref 134–144)
Total Protein: 6.7 g/dL (ref 6.0–8.5)
eGFR: 71 mL/min/{1.73_m2} (ref >59)

## 2021-02-22 LAB — URIC ACID: Uric Acid: 7.2 mg/dL (ref 3.1–7.9)

## 2021-02-22 LAB — LIPID PANEL W/O CHOL/HDL RATIO
Cholesterol, Total: 185 mg/dL (ref 100–199)
HDL: 57 mg/dL (ref >39)
LDL Chol Calc (NIH): 104 mg/dL — ABNORMAL HIGH (ref 0–99)
Triglycerides: 139 mg/dL (ref 0–149)
VLDL Cholesterol Cal: 24 mg/dL (ref 5–40)

## 2021-02-22 LAB — VITAMIN D 25 HYDROXY (VIT D DEFICIENCY, FRACTURES): Vit D, 25-Hydroxy: 29.6 ng/mL — ABNORMAL LOW (ref 30.0–100.0)

## 2021-02-22 LAB — TSH: TSH: 3.88 u[IU]/mL (ref 0.450–4.500)

## 2021-03-13 DIAGNOSIS — Z20822 Contact with and (suspected) exposure to covid-19: Secondary | ICD-10-CM | POA: Diagnosis not present

## 2021-03-17 DIAGNOSIS — G894 Chronic pain syndrome: Secondary | ICD-10-CM | POA: Diagnosis not present

## 2021-03-17 DIAGNOSIS — M47817 Spondylosis without myelopathy or radiculopathy, lumbosacral region: Secondary | ICD-10-CM | POA: Diagnosis not present

## 2021-03-17 DIAGNOSIS — M47812 Spondylosis without myelopathy or radiculopathy, cervical region: Secondary | ICD-10-CM | POA: Diagnosis not present

## 2021-03-17 DIAGNOSIS — M47814 Spondylosis without myelopathy or radiculopathy, thoracic region: Secondary | ICD-10-CM | POA: Diagnosis not present

## 2021-04-14 ENCOUNTER — Telehealth: Payer: Self-pay | Admitting: Family Medicine

## 2021-04-14 NOTE — Telephone Encounter (Signed)
Medication Refill - Medication: Lasix and Xanax (she is about to fly and has anxiety) also wants indocin for gout. She will be abroad for over 2 weeks.   Has the patient contacted their pharmacy? Yes.   (Agent: If no, request that the patient contact the pharmacy for the refill. If patient does not wish to contact the pharmacy document the reason why and proceed with request.) (Agent: If yes, when and what did the pharmacy advise?)  Preferred Pharmacy (with phone number or street name):  [facility] [place]  [place] [address]  Phone: [phone] Fax: [phone]   Has the patient been seen for an appointment in the last year OR does the patient have an upcoming appointment? Yes.    Agent: Please be advised that RX refills may take up to 3 business days. We ask that you follow-up with your pharmacy.

## 2021-04-14 NOTE — Telephone Encounter (Signed)
Call to patient- patient is leaving on cruise( 1/12) and is requesting medication to have on hand if needed. Patient will need new Rx for these medications-they are not current on list. Patient states the last time she had Indocin 50 mg filled was 2019. Lasix and Xanax requested also. Request sent to office for review

## 2021-04-16 DIAGNOSIS — G894 Chronic pain syndrome: Secondary | ICD-10-CM | POA: Diagnosis not present

## 2021-04-16 DIAGNOSIS — M47812 Spondylosis without myelopathy or radiculopathy, cervical region: Secondary | ICD-10-CM | POA: Diagnosis not present

## 2021-04-16 DIAGNOSIS — M47814 Spondylosis without myelopathy or radiculopathy, thoracic region: Secondary | ICD-10-CM | POA: Diagnosis not present

## 2021-04-16 DIAGNOSIS — M47817 Spondylosis without myelopathy or radiculopathy, lumbosacral region: Secondary | ICD-10-CM | POA: Diagnosis not present

## 2021-04-16 MED ORDER — INDOMETHACIN 50 MG PO CAPS: 50.0000 mg | ORAL_CAPSULE | Freq: Three times a day (TID) | ORAL | 0 refills | Status: DC

## 2021-04-16 MED ORDER — ALPRAZOLAM 0.5 MG PO TABS: ORAL_TABLET | ORAL | 0 refills | Status: DC

## 2021-04-16 MED ORDER — FUROSEMIDE 20 MG PO TABS: 20.0000 mg | ORAL_TABLET | Freq: Every day | ORAL | 0 refills | Status: DC

## 2021-04-16 NOTE — Telephone Encounter (Signed)
Patient notified that medication were sent in.

## 2021-06-05 DIAGNOSIS — M79673 Pain in unspecified foot: Secondary | ICD-10-CM | POA: Diagnosis not present

## 2021-06-05 DIAGNOSIS — M47816 Spondylosis without myelopathy or radiculopathy, lumbar region: Secondary | ICD-10-CM | POA: Diagnosis not present

## 2021-06-05 DIAGNOSIS — Z79891 Long term (current) use of opiate analgesic: Secondary | ICD-10-CM | POA: Diagnosis not present

## 2021-06-05 DIAGNOSIS — G894 Chronic pain syndrome: Secondary | ICD-10-CM | POA: Diagnosis not present

## 2021-06-05 DIAGNOSIS — M542 Cervicalgia: Secondary | ICD-10-CM | POA: Diagnosis not present

## 2021-06-05 DIAGNOSIS — Z79899 Other long term (current) drug therapy: Secondary | ICD-10-CM | POA: Diagnosis not present

## 2021-06-05 DIAGNOSIS — M169 Osteoarthritis of hip, unspecified: Secondary | ICD-10-CM | POA: Diagnosis not present

## 2021-06-12 ENCOUNTER — Encounter: Payer: Self-pay | Admitting: Nurse Practitioner

## 2021-06-12 ENCOUNTER — Ambulatory Visit (INDEPENDENT_AMBULATORY_CARE_PROVIDER_SITE_OTHER): Payer: Medicare Other | Admitting: Nurse Practitioner

## 2021-06-12 ENCOUNTER — Other Ambulatory Visit: Payer: Self-pay

## 2021-06-12 ENCOUNTER — Ambulatory Visit
Admission: RE | Admit: 2021-06-12 | Discharge: 2021-06-12 | Disposition: A | Discharge status: Home / Self Care | Payer: Medicare Other | Source: Ambulatory Visit | Attending: Nurse Practitioner | Admitting: Nurse Practitioner

## 2021-06-12 VITALS — BP 128/70 | HR 77 | Temp 98.8°F | Ht 65.0 in | Wt 188.8 lb

## 2021-06-12 DIAGNOSIS — R1084 Generalized abdominal pain: Secondary | ICD-10-CM | POA: Insufficient documentation

## 2021-06-12 DIAGNOSIS — K449 Diaphragmatic hernia without obstruction or gangrene: Secondary | ICD-10-CM | POA: Insufficient documentation

## 2021-06-12 DIAGNOSIS — R109 Unspecified abdominal pain: Secondary | ICD-10-CM | POA: Diagnosis not present

## 2021-06-12 DIAGNOSIS — N200 Calculus of kidney: Secondary | ICD-10-CM | POA: Insufficient documentation

## 2021-06-12 DIAGNOSIS — I700 Atherosclerosis of aorta: Secondary | ICD-10-CM | POA: Diagnosis not present

## 2021-06-12 LAB — URINALYSIS, ROUTINE W REFLEX MICROSCOPIC
Bilirubin, UA: NEGATIVE
Glucose, UA: NEGATIVE
Ketones, UA: NEGATIVE
Nitrite, UA: NEGATIVE
Protein,UA: NEGATIVE
Specific Gravity, UA: 1.02 (ref 1.005–1.030)
Urobilinogen, Ur: 0.2 mg/dL (ref 0.2–1.0)
pH, UA: 6 (ref 5.0–7.5)

## 2021-06-12 LAB — MICROSCOPIC EXAMINATION: RBC, Urine: NONE SEEN /hpf (ref 0–2)

## 2021-06-12 LAB — WET PREP FOR TRICH, YEAST, CLUE
Clue Cell Exam: NEGATIVE
Trichomonas Exam: NEGATIVE
Yeast Exam: NEGATIVE

## 2021-06-12 MED ORDER — AMOXICILLIN-POT CLAVULANATE 875-125 MG PO TABS: 1.0000 | ORAL_TABLET | Freq: Two times a day (BID) | ORAL | 0 refills | Status: DC

## 2021-06-12 NOTE — Patient Instructions (Signed)
Abdominal Pain, Adult Many things can cause belly (abdominal) pain. Most times, belly pain is not dangerous. Many cases of belly pain can be watched and treated at home. Sometimes, though, belly pain is serious. Yourdoctor will try to find the cause of your belly pain. Follow these instructions at home:  Medicines Take over-the-counter and prescription medicines only as told by your doctor. Do not take medicines that help you poop (laxatives) unless told by your doctor. General instructions Watch your belly pain for any changes. Drink enough fluid to keep your pee (urine) pale yellow. Keep all follow-up visits as told by your doctor. This is important. Contact a doctor if: Your belly pain changes or gets worse. You are not hungry, or you lose weight without trying. You are having trouble pooping (constipated) or have watery poop (diarrhea) for more than 2-3 days. You have pain when you pee or poop. Your belly pain wakes you up at night. Your pain gets worse with meals, after eating, or with certain foods. You are vomiting and cannot keep anything down. You have a fever. You have blood in your pee. Get help right away if: Your pain does not go away as soon as your doctor says it should. You cannot stop vomiting. Your pain is only in areas of your belly, such as the right side or the left lower part of the belly. You have bloody or black poop, or poop that looks like tar. You have very bad pain, cramping, or bloating in your belly. You have signs of not having enough fluid or water in your body (dehydration), such as: Dark pee, very little pee, or no pee. Cracked lips. Dry mouth. Sunken eyes. Sleepiness. Weakness. You have trouble breathing or chest pain. Summary Many cases of belly pain can be watched and treated at home. Watch your belly pain for any changes. Take over-the-counter and prescription medicines only as told by your doctor. Contact a doctor if your belly pain  changes or gets worse. Get help right away if you have very bad pain, cramping, or bloating in your belly. This information is not intended to replace advice given to you by your health care provider. Make sure you discuss any questions you have with your healthcare provider. Document Revised: 08/01/2018 Document Reviewed: 08/01/2018 Elsevier Patient Education  2022 Elsevier Inc.  

## 2021-06-12 NOTE — Assessment & Plan Note (Addendum)
Acute to lower abdomen -- ?UTI related vs diverticulitis.  UA trace blood and leuks, neg nitrite. Wet prep negative.  Will obtain CT abd/pelvis to further assess today due to her history and exquisite tenderness on exam.  At this time start Augmentin for UTI symptoms and send for culture.  Will adjust as needed based on culture and imaging results.  Discussed with her new guidelines for treatment diverticulitis, she reports understanding.  CBC and CMP.  Plan on return to office in one week, sooner if worsening symptoms. ?

## 2021-06-12 NOTE — Progress Notes (Signed)
Contacted via [place] ? ? ?Good evening Tamara Fleming, your imaging has returned.   ?- You do have some diverticulosis, outpouching of the diverticula which has been seen before, but no diverticulitis (inflammation).  Great news!!!   ?- There is a moderate size hiatal hernia, if this starts to bother you with increased heart burn symptoms or epigastric pain definitely let us know and we can further assess. ?- There is a 3 mm kidney stone in left kidney, but this is not obstructing anything.  Bladder walls do look a little inflamed, so I suspect more urine infection. Continue the Augmentin I sent in and keep appointment in one week for follow-up.  If ongoing pain then we may send you to urology to look at the stone further, however it should not be causing issues at this time.  Please let me know you received this messaged and if you have any questions? ?Keep being stellar!!  Thank you for allowing me to participate in your care.  I appreciate you. ?Kindest regards, ?Martasia Talamante ?

## 2021-06-12 NOTE — Progress Notes (Signed)
? ?BP 128/70   Pulse 77   Temp 98.8 ?F (37.1 ?C) (Oral)   Ht _0  (1.651 m)   Wt 188 lb 12.8 oz (85.6 kg)   LMP  (LMP Unknown)   SpO2 97%   BMI 31.42 kg/m?   ? ?Subjective:  ? ? Patient ID: Tamara Fleming, female    DOB: April 13, 1945, 76 y.o.   MRN: [id number] ? ?HPI: ?SPRUHA WEIGHT is a 76 y.o. female ? ?Chief Complaint  ?Patient presents with  ? Diverticulitis  ?  Patient is here for Diverticulitis. Patient states she thinks she is having a flare up. Patient states she is having lower abdominal pain/discomfort and she also thinks she may have a UTI with the symptoms. Patient denies having any pain with urination (burning). Patient first noticed about 4 days ago.   ? ?ABDOMINAL PAIN  ?Discomfort started 1 1/2 weeks ago, was not too bad and waxed and waned.  Then started to get worse over past 2 days, all the way across bottom of her stomach.  Has had diverticulitis in past and feels like that may be present.  Does have history of constipation, but none at present. ?Duration:days ?Onset: gradual ?Severity: 8/10 ?Quality: sharp, dull, aching, and throbbing ?Location:  LLQ, RLQ, and suprapubic". "lower abdominal quadrants  ?Episode duration:  ?Radiation: no ?Frequency: intermittent ?Alleviating factors:  ?Aggravating factors: ?Status: fluctuating ?Treatments attempted: Indocin ?Fever: ran fever Sunday night ?Nausea: no ?Vomiting: no ?Weight loss: no ?Decreased appetite: yes ?Diarrhea: yes ?Constipation: yes ?Blood in stool: no ?Heartburn: yes ?Jaundice: no ?Rash: no ?Dysuria/urinary frequency: no ?Hematuria: no ?History of sexually transmitted disease: no ?Recurrent NSAID use: no  ? ?Relevant past medical, surgical, family and social history reviewed and updated as indicated. Interim medical history since our last visit reviewed. ?Allergies and medications reviewed and updated. ? ?Review of Systems  ?Constitutional:  Positive for appetite change and fever. Negative for activity change, diaphoresis and  fatigue.  ?Respiratory:  Negative for cough, chest tightness and shortness of breath.   ?Cardiovascular:  Negative for chest pain, palpitations and leg swelling.  ?Gastrointestinal:  Positive for abdominal pain, constipation and diarrhea. Negative for abdominal distention, nausea and vomiting.  ?Neurological: Negative.   ?Psychiatric/Behavioral: Negative.    ? ?Per HPI unless specifically indicated above ? ?   ?Objective:  ?  ?BP 128/70   Pulse 77   Temp 98.8 ?F (37.1 ?C) (Oral)   Ht _1  (1.651 m)   Wt 188 lb 12.8 oz (85.6 kg)   LMP  (LMP Unknown)   SpO2 97%   BMI 31.42 kg/m?   ?Wt Readings from Last 3 Encounters:  ?06/12/21 188 lb 12.8 oz (85.6 kg)  ?02/21/21 189 lb 14.4 oz (86.1 kg)  ?10/23/20 185 lb (83.9 kg)  ?  ?Physical Exam ?Vitals and nursing note reviewed.  ?Constitutional:   ?   General: She is awake. She is not in acute distress. ?   Appearance: She is well-developed and well-groomed. She is obese. She is not ill-appearing or toxic-appearing.  ?HENT:  ?   Head: Normocephalic.  ?   Right Ear: Hearing normal.  ?   Left Ear: Hearing normal.  ?Eyes:  ?   General: Lids are normal.     ?   Right eye: No discharge.     ?   Left eye: No discharge.  ?   Conjunctiva/sclera: Conjunctivae normal.  ?   Pupils: Pupils are equal, round, and reactive to light.  ?Neck:  ?  Thyroid: No thyromegaly.  ?   Vascular: No carotid bruit.  ?Cardiovascular:  ?   Rate and Rhythm: Normal rate and regular rhythm.  ?   Heart sounds: Normal heart sounds. No murmur heard. ?  No gallop.  ?Pulmonary:  ?   Effort: Pulmonary effort is normal. No accessory muscle usage or respiratory distress.  ?   Breath sounds: Normal breath sounds.  ?Abdominal:  ?   General: Bowel sounds are normal. There is no distension.  ?   Palpations: Abdomen is soft. There is no hepatomegaly.  ?   Tenderness: There is abdominal tenderness in the right lower quadrant, suprapubic area and left lower quadrant. There is no right CVA tenderness, left CVA  tenderness or guarding. Negative signs include Murphy's sign.  ?   Hernia: No hernia is present.  ?   Comments: Exquisite tenderness to lower quadrant left and right.    ?Musculoskeletal:  ?   Cervical back: Normal range of motion and neck supple.  ?   Right lower leg: No edema.  ?   Left lower leg: No edema.  ?Lymphadenopathy:  ?   Cervical: No cervical adenopathy.  ?Skin: ?   General: Skin is warm and dry.  ?Neurological:  ?   Mental Status: She is alert and oriented to person, place, and time.  ?Psychiatric:     ?   Attention and Perception: Attention normal.     ?   Mood and Affect: Mood normal.     ?   Speech: Speech normal.     ?   Behavior: Behavior normal. Behavior is cooperative.     ?   Thought Content: Thought content normal.  ? ?Results for orders placed or performed in visit on 02/21/21  ?CBC with Differential/Platelet  ?Result Value Ref Range  ? WBC 4.3 3.4 - 10.8 x10E3/uL  ? RBC 3.85 3.77 - 5.28 x10E6/uL  ? Hemoglobin 13.3 11.1 - 15.9 g/dL  ? Hematocrit 38.8 34.0 - 46.6 %  ? MCV 101 (H) 79 - 97 fL  ? MCH 34.5 (H) 26.6 - 33.0 pg  ? MCHC 34.3 31.5 - 35.7 g/dL  ? RDW 11.8 11.7 - 15.4 %  ? Platelets 141 (L) 150 - 450 x10E3/uL  ? Neutrophils 53 Not Estab. %  ? Lymphs 31 Not Estab. %  ? Monocytes 12 Not Estab. %  ? Eos 3 Not Estab. %  ? Basos 1 Not Estab. %  ? Neutrophils Absolute 2.3 1.4 - 7.0 x10E3/uL  ? Lymphocytes Absolute 1.3 0.7 - 3.1 x10E3/uL  ? Monocytes Absolute 0.5 0.1 - 0.9 x10E3/uL  ? EOS (ABSOLUTE) 0.1 0.0 - 0.4 x10E3/uL  ? Basophils Absolute 0.0 0.0 - 0.2 x10E3/uL  ? Immature Granulocytes 0 Not Estab. %  ? Immature Grans (Abs) 0.0 0.0 - 0.1 x10E3/uL  ?Comprehensive metabolic panel  ?Result Value Ref Range  ? Glucose 146 (H) 70 - 99 mg/dL  ? BUN 13 8 - 27 mg/dL  ? Creatinine, Ser 0.86 0.57 - 1.00 mg/dL  ? eGFR 71 >59 mL/min/1.73  ? BUN/Creatinine Ratio 15 12 - 28  ? Sodium 139 134 - 144 mmol/L  ? Potassium 4.1 3.5 - 5.2 mmol/L  ? Chloride 100 96 - 106 mmol/L  ? CO2 22 20 - 29 mmol/L  ? Calcium  9.5 8.7 - 10.3 mg/dL  ? Total Protein 6.7 6.0 - 8.5 g/dL  ? Albumin 4.5 3.7 - 4.7 g/dL  ? Globulin, Total 2.2 1.5 - 4.5 g/dL  ?  Albumin/Globulin Ratio 2.0 1.2 - 2.2  ? Bilirubin Total 0.6 0.0 - 1.2 mg/dL  ? Alkaline Phosphatase 88 44 - 121 IU/L  ? AST 37 0 - 40 IU/L  ? ALT 36 (H) 0 - 32 IU/L  ?Lipid Panel w/o Chol/HDL Ratio  ?Result Value Ref Range  ? Cholesterol, Total 185 100 - 199 mg/dL  ? Triglycerides 139 0 - 149 mg/dL  ? HDL 57 >39 mg/dL  ? VLDL Cholesterol Cal 24 5 - 40 mg/dL  ? LDL Chol Calc (NIH) 104 (H) 0 - 99 mg/dL  ?Bayer DCA Hb A1c Waived  ?Result Value Ref Range  ? HB A1C (BAYER DCA - WAIVED) 5.9 (H) 4.8 - 5.6 %  ?TSH  ?Result Value Ref Range  ? TSH 3.880 0.450 - 4.500 uIU/mL  ?VITAMIN D 25 Hydroxy (Vit-D Deficiency, Fractures)  ?Result Value Ref Range  ? Vit D, 25-Hydroxy 29.6 (L) 30.0 - 100.0 ng/mL  ?Uric acid  ?Result Value Ref Range  ? Uric Acid 7.2 3.1 - 7.9 mg/dL  ? ?   ?Assessment & Plan:  ? ?Problem List Items Addressed This Visit   ? ?  ? Other  ? Generalized abdominal pain - Primary  ?  Acute to lower abdomen -- ?UTI related vs diverticulitis.  UA trace blood and leuks, neg nitrite. Wet prep negative.  Will obtain CT abd/pelvis to further assess today due to her history and exquisite tenderness on exam.  At this time start Augmentin for UTI symptoms and send for culture.  Will adjust as needed based on culture and imaging results.  Discussed with her new guidelines for treatment diverticulitis, she reports understanding.  CBC and CMP.  Plan on return to office in one week, sooner if worsening symptoms. ?  ?  ? Relevant Orders  ? WET PREP FOR [place], YEAST, CLUE  ? Urinalysis, Routine w reflex microscopic  ? Urine Culture  ? CT Abdomen Pelvis Wo Contrast  ? CBC with Differential/Platelet  ? Comprehensive metabolic panel  ?  ? ?Follow up plan: ?Return in about 1 week (around 06/19/2021) for Abdominal pain with PCP or myself. ? ? ? ? ? ?

## 2021-06-13 ENCOUNTER — Ambulatory Visit: Payer: Self-pay

## 2021-06-13 ENCOUNTER — Encounter: Payer: Self-pay | Admitting: Nurse Practitioner

## 2021-06-13 LAB — CBC WITH DIFFERENTIAL/PLATELET
Basophils Absolute: 0 10*3/uL (ref 0.0–0.2)
Basos: 0 %
EOS (ABSOLUTE): 0.2 10*3/uL (ref 0.0–0.4)
Eos: 4 %
Hematocrit: 37 % (ref 34.0–46.6)
Hemoglobin: 13.2 g/dL (ref 11.1–15.9)
Immature Grans (Abs): 0 10*3/uL (ref 0.0–0.1)
Immature Granulocytes: 0 %
Lymphocytes Absolute: 1.6 10*3/uL (ref 0.7–3.1)
Lymphs: 30 %
MCH: 35.2 pg — ABNORMAL HIGH (ref 26.6–33.0)
MCHC: 35.7 g/dL (ref 31.5–35.7)
MCV: 99 fL — ABNORMAL HIGH (ref 79–97)
Monocytes Absolute: 0.7 10*3/uL (ref 0.1–0.9)
Monocytes: 13 %
Neutrophils Absolute: 2.7 10*3/uL (ref 1.4–7.0)
Neutrophils: 53 %
Platelets: 190 10*3/uL (ref 150–450)
RBC: 3.75 x10E6/uL — ABNORMAL LOW (ref 3.77–5.28)
RDW: 11.6 % — ABNORMAL LOW (ref 11.7–15.4)
WBC: 5.1 10*3/uL (ref 3.4–10.8)

## 2021-06-13 LAB — COMPREHENSIVE METABOLIC PANEL
ALT: 35 IU/L — ABNORMAL HIGH (ref 0–32)
AST: 40 IU/L (ref 0–40)
Albumin/Globulin Ratio: 1.7 (ref 1.2–2.2)
Albumin: 4.3 g/dL (ref 3.7–4.7)
Alkaline Phosphatase: 117 IU/L (ref 44–121)
BUN/Creatinine Ratio: 14 (ref 12–28)
BUN: 13 mg/dL (ref 8–27)
Bilirubin Total: 0.4 mg/dL (ref 0.0–1.2)
CO2: 22 mmol/L (ref 20–29)
Calcium: 9.3 mg/dL (ref 8.7–10.3)
Chloride: 103 mmol/L (ref 96–106)
Creatinine, Ser: 0.93 mg/dL (ref 0.57–1.00)
Globulin, Total: 2.5 g/dL (ref 1.5–4.5)
Glucose: 124 mg/dL — ABNORMAL HIGH (ref 70–99)
Potassium: 4.9 mmol/L (ref 3.5–5.2)
Sodium: 139 mmol/L (ref 134–144)
Total Protein: 6.8 g/dL (ref 6.0–8.5)
eGFR: 64 mL/min/{1.73_m2} (ref >59)

## 2021-06-13 MED ORDER — CEFPODOXIME PROXETIL 100 MG PO TABS: 100.0000 mg | ORAL_TABLET | Freq: Two times a day (BID) | ORAL | 0 refills | Status: DC

## 2021-06-13 MED ORDER — CEFPODOXIME PROXETIL 100 MG/5ML PO SUSR: 100.0000 mg | Freq: Two times a day (BID) | ORAL | 0 refills | Status: AC

## 2021-06-13 NOTE — Telephone Encounter (Signed)
Liquid cefpodixime sent to her pharmacy ?

## 2021-06-13 NOTE — Telephone Encounter (Signed)
Pt is asking if the liquid of Augmentin can be sent into [facility]. She is unable to swallow the tablet without getting choked. Pt is also asking about CT results from yesterday and would like f/up on if medication is being sent in and if someone can call her about results before next week. I advised pt I will send to office and someone can f/up.  ? ?Summary: medication changed reqquested pat choked on pill  ? Patient called in to inform Jolene Cannady that she choked on the antibiotic amoxicillin-clavulanate (AUGMENTIN) 875-125 MG tablet. Would like something else called in that have smaller pills or even liquid medication. Any questions please call patient asking if she can get this today please. Can be reached at Ph# [phone]  ?  ? ? ?Reason for Disposition ? [1] Caller has URGENT medicine question about med that PCP or specialist prescribed AND [2] triager unable to answer question ? ?Answer Assessment - Initial Assessment Questions ?1. NAME of MEDICATION: "What medicine are you calling about?" ?    Augmentin  ?2. QUESTION: "What is your question?" (e.g., double dose of medicine, side effect) ?    Pill is too big to swallow without choking  ?3. PRESCRIBING HCP: "Who prescribed it?" Reason: if prescribed by specialist, call should be referred to that group. Henrine Screws, NP ? ?Protocols used: Medication Question Call-A-AH ? ?

## 2021-06-13 NOTE — Addendum Note (Signed)
Addended by: Valerie Roys on: 06/13/2021 01:47 PM ? ? Modules accepted: Orders ? ?

## 2021-06-13 NOTE — Addendum Note (Signed)
Addended by: Marnee Guarneri T on: 06/13/2021 12:10 PM ? ? Modules accepted: Orders ? ?

## 2021-06-13 NOTE — Telephone Encounter (Signed)
Patient is aware. No further questions.  ?

## 2021-06-13 NOTE — Telephone Encounter (Signed)
Spoke with patient and notified her of recent CT results. Patient verbalized understanding and her only question was to make sure that the new prescription isn't part of the family of the allergies listed in her allergy list. Patient was advised that the providers reviews the allergies in patient's chart before prescribing a medication. Patient verbalized understanding and has no further questions.  ?

## 2021-06-13 NOTE — Telephone Encounter (Signed)
It looks like Tamara Fleming has already resulted on her CT- please give patient the information that is on her mychart.  ?

## 2021-06-13 NOTE — Progress Notes (Signed)
Contacted via [place] ? ? ?Good morning Tamara Fleming, your labs have returned and kidney function, creatinine and eGFR, remains normal.  Liver function, AST and ALT, shows very mild elevation in ALT but this has come down from previous check.  Something we will monitor.  Sugar, glucose, remains elevated -- focus heavily on diet changes at home.  CBC shows no elevation in white blood cell count, great news!!  Elevation would be concern for infection.  Any questions? ?Keep being stellar!!  Thank you for allowing me to participate in your care.  I appreciate you. ?Kindest regards, ?Norine Reddington ?

## 2021-06-14 LAB — URINE CULTURE

## 2021-06-14 NOTE — Progress Notes (Signed)
Contacted via [place] ? ? ?No urine infection noted on urine sample, however with your discomfort I recommend continue Vantin suspension at this time.  We can further assess next week if ongoing pain.  Have a good weekend.

## 2021-06-15 NOTE — Patient Instructions (Incomplete)
Abdominal Pain, Adult Many things can cause belly (abdominal) pain. Most times, belly pain is not dangerous. Many cases of belly pain can be watched and treated at home. Sometimes, though, belly pain is serious. Your doctor will try to find the cause of your belly pain. Follow these instructions at home: Medicines Take over-the-counter and prescription medicines only as told by your doctor. Do not take medicines that help you poop (laxatives) unless told by your doctor. General instructions Watch your belly pain for any changes. Drink enough fluid to keep your pee (urine) pale yellow. Keep all follow-up visits as told by your doctor. This is important. Contact a doctor if: Your belly pain changes or gets worse. You are not hungry, or you lose weight without trying. You are having trouble pooping (constipated) or have watery poop (diarrhea) for more than 2-3 days. You have pain when you pee or poop. Your belly pain wakes you up at night. Your pain gets worse with meals, after eating, or with certain foods. You are vomiting and cannot keep anything down. You have a fever. You have blood in your pee. Get help right away if: Your pain does not go away as soon as your doctor says it should. You cannot stop vomiting. Your pain is only in areas of your belly, such as the right side or the left lower part of the belly. You have bloody or black poop, or poop that looks like tar. You have very bad pain, cramping, or bloating in your belly. You have signs of not having enough fluid or water in your body (dehydration), such as: Dark pee, very little pee, or no pee. Cracked lips. Dry mouth. Sunken eyes. Sleepiness. Weakness. You have trouble breathing or chest pain. Summary Many cases of belly pain can be watched and treated at home. Watch your belly pain for any changes. Take over-the-counter and prescription medicines only as told by your doctor. Contact a doctor if your belly pain changes  or gets worse. Get help right away if you have very bad pain, cramping, or bloating in your belly. This information is not intended to replace advice given to you by your health care provider. Make sure you discuss any questions you have with your health care provider. Document Revised: 08/01/2018 Document Reviewed: 08/01/2018 Elsevier Patient Education  [place].

## 2021-06-19 ENCOUNTER — Other Ambulatory Visit: Payer: Self-pay

## 2021-06-19 ENCOUNTER — Ambulatory Visit (INDEPENDENT_AMBULATORY_CARE_PROVIDER_SITE_OTHER): Payer: Medicare Other | Admitting: Nurse Practitioner

## 2021-06-19 ENCOUNTER — Encounter: Payer: Self-pay | Admitting: Nurse Practitioner

## 2021-06-19 DIAGNOSIS — R1084 Generalized abdominal pain: Secondary | ICD-10-CM

## 2021-06-19 NOTE — Assessment & Plan Note (Signed)
Acute and improved at this time.  Discussed CT at length with patient and explained everything in detail + labs. ?

## 2021-06-19 NOTE — Progress Notes (Signed)
? ?BP 136/65   Pulse 63   Wt 188 lb (85.3 kg)   LMP  (LMP Unknown)   SpO2 98%   BMI 31.28 kg/m?   ? ?Subjective:  ? ? Patient ID: Tamara Fleming, female    DOB: 03-23-1946, 76 y.o.   MRN: [id number] ? ?HPI: ?Tamara Fleming is a 76 y.o. female ? ?Chief Complaint  ?Patient presents with  ? Abdominal Pain  ?  Patient states she feels better today. Would like to discuss CT results.   ? ?ABDOMINAL PAIN  ?Follow-up for abdominal pain.  CT recently done noting no diverticulitis, moderate hiatal hernia, and a 3 mm non obstructing calculus in lower pole of left kidney.  Started on Vantin for bladder infection and reports pain is improved at this time. ?Status: improved ?Treatments attempted: Indocin, Vantin ?Fever: none ?Nausea: no ?Vomiting: no ?Weight loss: no ?Decreased appetite: none ?Diarrhea: none ?Constipation: none ?Blood in stool: no ?Heartburn: yes ?Jaundice: no ?Rash: no ?Dysuria/urinary frequency: no ?Hematuria: no ?History of sexually transmitted disease: no ?Recurrent NSAID use: no  ? ?Relevant past medical, surgical, family and social history reviewed and updated as indicated. Interim medical history since our last visit reviewed. ?Allergies and medications reviewed and updated. ? ?Review of Systems  ?Constitutional:  Negative for activity change, appetite change, diaphoresis, fatigue and fever.  ?Respiratory:  Negative for cough, chest tightness and shortness of breath.   ?Cardiovascular:  Negative for chest pain, palpitations and leg swelling.  ?Gastrointestinal:  Negative for abdominal distention, abdominal pain, constipation, diarrhea, nausea and vomiting.  ?Neurological: Negative.   ?Psychiatric/Behavioral: Negative.    ? ?Per HPI unless specifically indicated above ? ?   ?Objective:  ?  ?BP 136/65   Pulse 63   Wt 188 lb (85.3 kg)   LMP  (LMP Unknown)   SpO2 98%   BMI 31.28 kg/m?   ?Wt Readings from Last 3 Encounters:  ?06/19/21 188 lb (85.3 kg)  ?06/12/21 188 lb 12.8 oz (85.6 kg)  ?02/21/21  189 lb 14.4 oz (86.1 kg)  ?  ?Physical Exam ?Vitals and nursing note reviewed.  ?Constitutional:   ?   General: She is awake. She is not in acute distress. ?   Appearance: She is well-developed and well-groomed. She is obese. She is not ill-appearing or toxic-appearing.  ?HENT:  ?   Head: Normocephalic.  ?   Right Ear: Hearing normal.  ?   Left Ear: Hearing normal.  ?Eyes:  ?   General: Lids are normal.     ?   Right eye: No discharge.     ?   Left eye: No discharge.  ?   Conjunctiva/sclera: Conjunctivae normal.  ?   Pupils: Pupils are equal, round, and reactive to light.  ?Neck:  ?   Thyroid: No thyromegaly.  ?   Vascular: No carotid bruit.  ?Cardiovascular:  ?   Rate and Rhythm: Normal rate and regular rhythm.  ?   Heart sounds: Normal heart sounds. No murmur heard. ?  No gallop.  ?Pulmonary:  ?   Effort: Pulmonary effort is normal. No accessory muscle usage or respiratory distress.  ?   Breath sounds: Normal breath sounds.  ?Abdominal:  ?   General: Bowel sounds are normal. There is no distension.  ?   Palpations: Abdomen is soft. There is no hepatomegaly.  ?   Tenderness: There is no abdominal tenderness. There is no right CVA tenderness, left CVA tenderness or guarding. Negative signs include Murphy's sign.  ?  Hernia: No hernia is present.  ?Musculoskeletal:  ?   Cervical back: Normal range of motion and neck supple.  ?   Right lower leg: No edema.  ?   Left lower leg: No edema.  ?Lymphadenopathy:  ?   Cervical: No cervical adenopathy.  ?Skin: ?   General: Skin is warm and dry.  ?Neurological:  ?   Mental Status: She is alert and oriented to person, place, and time.  ?Psychiatric:     ?   Attention and Perception: Attention normal.     ?   Mood and Affect: Mood normal.     ?   Speech: Speech normal.     ?   Behavior: Behavior normal. Behavior is cooperative.     ?   Thought Content: Thought content normal.  ? ?Results for orders placed or performed in visit on 06/12/21  ?Urine Culture  ? Specimen: Urine  ? UR   ?Result Value Ref Range  ? Urine Culture, Routine Final report   ? Organism ID, Bacteria Comment   ?Microscopic Examination  ?Result Value Ref Range  ? WBC, UA 0-5 0 - 5 /hpf  ? RBC None seen 0 - 2 /hpf  ? Epithelial Cells (non renal) 0-10 0 - 10 /hpf  ? Bacteria, UA Few (A) None seen/Few  ?WET PREP FOR [place], YEAST, CLUE  ? Urine  ?Result Value Ref Range  ? Trichomonas Exam Negative Negative  ? Yeast Exam Negative Negative  ? Clue Cell Exam Negative Negative  ?Urinalysis, Routine w reflex microscopic  ?Result Value Ref Range  ? Specific Gravity, UA 1.020 1.005 - 1.030  ? pH, UA 6.0 5.0 - 7.5  ? Color, UA Yellow Yellow  ? Appearance Ur Cloudy (A) Clear  ? Leukocytes,UA 2+ (A) Negative  ? Protein,UA Negative Negative/Trace  ? Glucose, UA Negative Negative  ? Ketones, UA Negative Negative  ? RBC, UA Trace (A) Negative  ? Bilirubin, UA Negative Negative  ? Urobilinogen, Ur 0.2 0.2 - 1.0 mg/dL  ? Nitrite, UA Negative Negative  ? Microscopic Examination See below:   ?CBC with Differential/Platelet  ?Result Value Ref Range  ? WBC 5.1 3.4 - 10.8 x10E3/uL  ? RBC 3.75 (L) 3.77 - 5.28 x10E6/uL  ? Hemoglobin 13.2 11.1 - 15.9 g/dL  ? Hematocrit 37.0 34.0 - 46.6 %  ? MCV 99 (H) 79 - 97 fL  ? MCH 35.2 (H) 26.6 - 33.0 pg  ? MCHC 35.7 31.5 - 35.7 g/dL  ? RDW 11.6 (L) 11.7 - 15.4 %  ? Platelets 190 150 - 450 x10E3/uL  ? Neutrophils 53 Not Estab. %  ? Lymphs 30 Not Estab. %  ? Monocytes 13 Not Estab. %  ? Eos 4 Not Estab. %  ? Basos 0 Not Estab. %  ? Neutrophils Absolute 2.7 1.4 - 7.0 x10E3/uL  ? Lymphocytes Absolute 1.6 0.7 - 3.1 x10E3/uL  ? Monocytes Absolute 0.7 0.1 - 0.9 x10E3/uL  ? EOS (ABSOLUTE) 0.2 0.0 - 0.4 x10E3/uL  ? Basophils Absolute 0.0 0.0 - 0.2 x10E3/uL  ? Immature Granulocytes 0 Not Estab. %  ? Immature Grans (Abs) 0.0 0.0 - 0.1 x10E3/uL  ?Comprehensive metabolic panel  ?Result Value Ref Range  ? Glucose 124 (H) 70 - 99 mg/dL  ? BUN 13 8 - 27 mg/dL  ? Creatinine, Ser 0.93 0.57 - 1.00 mg/dL  ? eGFR 64 >59 mL/min/1.73   ? BUN/Creatinine Ratio 14 12 - 28  ? Sodium 139 134 - 144 mmol/L  ?  Potassium 4.9 3.5 - 5.2 mmol/L  ? Chloride 103 96 - 106 mmol/L  ? CO2 22 20 - 29 mmol/L  ? Calcium 9.3 8.7 - 10.3 mg/dL  ? Total Protein 6.8 6.0 - 8.5 g/dL  ? Albumin 4.3 3.7 - 4.7 g/dL  ? Globulin, Total 2.5 1.5 - 4.5 g/dL  ? Albumin/Globulin Ratio 1.7 1.2 - 2.2  ? Bilirubin Total 0.4 0.0 - 1.2 mg/dL  ? Alkaline Phosphatase 117 44 - 121 IU/L  ? AST 40 0 - 40 IU/L  ? ALT 35 (H) 0 - 32 IU/L  ? ?   ?Assessment & Plan:  ? ?Problem List Items Addressed This Visit   ? ?  ? Other  ? Generalized abdominal pain  ?  Acute and improved at this time.  Discussed CT at length with patient and explained everything in detail + labs. ?  ?  ?  ? ?Follow up plan: ?Return for as schedule in May with Dr. Lenna Sciara. ? ? ? ? ? ?

## 2021-06-21 DIAGNOSIS — Z20822 Contact with and (suspected) exposure to covid-19: Secondary | ICD-10-CM | POA: Diagnosis not present

## 2021-07-09 ENCOUNTER — Telehealth: Payer: Self-pay | Admitting: Family Medicine

## 2021-07-09 NOTE — Telephone Encounter (Signed)
Pt says that she is out of town. Pt says that she just realized that she left her  ?atorvastatin (LIPITOR) 10 MG tablet  and 2 blood pressure medications at home. Pt would like to know if provider would send in a 2 week supply to the pharmacy there to hold her over until she's back home?  ? ? ? ?Pharmacy: [facility], [place], [place] ?

## 2021-07-10 MED ORDER — SPIRONOLACTONE 25 MG PO TABS: 25.0000 mg | ORAL_TABLET | Freq: Every day | ORAL | 0 refills | Status: DC

## 2021-07-10 MED ORDER — LOSARTAN POTASSIUM 50 MG PO TABS: 50.0000 mg | ORAL_TABLET | Freq: Every day | ORAL | 0 refills | Status: DC

## 2021-07-10 MED ORDER — ATORVASTATIN CALCIUM 10 MG PO TABS
10.0000 mg | ORAL_TABLET | Freq: Every day | ORAL | 0 refills | Status: DC
Start: 2021-07-10 — End: 2021-08-21

## 2021-07-10 NOTE — Telephone Encounter (Signed)
Patient called in to inform Dr Wynetta Emery that she is waiting on her medication because she is already behind on taking it asking for a call when Rx is sent to pharmacy in [facility]. Can be reached at Ph# [phone] ?

## 2021-07-10 NOTE — Telephone Encounter (Signed)
Prescription tee'd up if you are sending ?

## 2021-07-10 NOTE — Telephone Encounter (Signed)
In re: to call for forgotten med.Marland KitchenMarland KitchenPt picked up her Lipitor states that was the only one they received  but does not have her 2 BP meds, has been out 11/2 days  ? ?losartan (COZAAR) 50 MG tablet 90 tablet 1 02/21/2021    ?Sig - Route: Take 1 tablet (50 mg total) by mouth daily. - Ora   ? ?spironolactone (ALDACTONE) 25 MG tablet 90 tablet 1 02/21/2021    ?Sig - Route: Take 1 tablet (25 mg total) by mouth daily. - Oral   ?Sent to pharmacy as: spironolactone (ALDACTONE) 25 MG tablet   ? ?[facility] [place], [address]  ?[place] [address]  ?Phone: [phone] Fax: [phone]  ? ?

## 2021-07-10 NOTE — Addendum Note (Signed)
Addended by: Valerie Roys on: 07/10/2021 03:02 PM ? ? Modules accepted: Orders ? ?

## 2021-07-21 DIAGNOSIS — Z20822 Contact with and (suspected) exposure to covid-19: Secondary | ICD-10-CM | POA: Diagnosis not present

## 2021-08-01 DIAGNOSIS — M545 Low back pain, unspecified: Secondary | ICD-10-CM | POA: Diagnosis not present

## 2021-08-01 DIAGNOSIS — M47817 Spondylosis without myelopathy or radiculopathy, lumbosacral region: Secondary | ICD-10-CM | POA: Diagnosis not present

## 2021-08-09 DIAGNOSIS — Z20822 Contact with and (suspected) exposure to covid-19: Secondary | ICD-10-CM | POA: Diagnosis not present

## 2021-08-21 ENCOUNTER — Encounter: Payer: Self-pay | Admitting: Family Medicine

## 2021-08-21 ENCOUNTER — Ambulatory Visit (INDEPENDENT_AMBULATORY_CARE_PROVIDER_SITE_OTHER): Payer: Medicare Other | Admitting: Family Medicine

## 2021-08-21 VITALS — BP 131/71 | HR 72 | Temp 98.6°F | Wt 186.0 lb

## 2021-08-21 DIAGNOSIS — I700 Atherosclerosis of aorta: Secondary | ICD-10-CM | POA: Diagnosis not present

## 2021-08-21 DIAGNOSIS — E669 Obesity, unspecified: Secondary | ICD-10-CM

## 2021-08-21 DIAGNOSIS — E663 Overweight: Secondary | ICD-10-CM | POA: Insufficient documentation

## 2021-08-21 DIAGNOSIS — E7800 Pure hypercholesterolemia, unspecified: Secondary | ICD-10-CM | POA: Diagnosis not present

## 2021-08-21 DIAGNOSIS — I10 Essential (primary) hypertension: Secondary | ICD-10-CM

## 2021-08-21 DIAGNOSIS — J069 Acute upper respiratory infection, unspecified: Secondary | ICD-10-CM

## 2021-08-21 MED ORDER — OMEPRAZOLE MAGNESIUM 20 MG PO TBEC: 20.0000 mg | DELAYED_RELEASE_TABLET | Freq: Every day | ORAL | 1 refills | Status: DC

## 2021-08-21 MED ORDER — PREDNISONE 50 MG PO TABS
50.0000 mg | ORAL_TABLET | Freq: Every day | ORAL | 0 refills | Status: DC
Start: 2021-08-21 — End: 2021-08-25

## 2021-08-21 MED ORDER — SPIRONOLACTONE 25 MG PO TABS: 25.0000 mg | ORAL_TABLET | Freq: Every day | ORAL | 1 refills | Status: DC

## 2021-08-21 MED ORDER — ATORVASTATIN CALCIUM 10 MG PO TABS: 10.0000 mg | ORAL_TABLET | Freq: Every day | ORAL | 1 refills | Status: DC

## 2021-08-21 MED ORDER — LOSARTAN POTASSIUM 50 MG PO TABS: 50.0000 mg | ORAL_TABLET | Freq: Every day | ORAL | 1 refills | Status: DC

## 2021-08-21 MED ORDER — INDOMETHACIN 50 MG PO CAPS: 50.0000 mg | ORAL_CAPSULE | Freq: Three times a day (TID) | ORAL | 2 refills | Status: DC

## 2021-08-21 NOTE — Assessment & Plan Note (Signed)
Under good control on current regimen. Continue current regimen. Continue to monitor. Call with any concerns. Refills given. Labs drawn today.   

## 2021-08-21 NOTE — Progress Notes (Signed)
BP 131/71   Pulse 72   Temp 98.6 F (37 C)   Wt 186 lb (84.4 kg)   LMP  (LMP Unknown)   SpO2 98%   BMI 30.95 kg/m    Subjective:    Patient ID: Tamara Fleming, female    DOB: Aug 18, 1945, 76 y.o.   MRN: [id number]  HPI: Tamara Fleming is a 76 y.o. female  Chief Complaint  Patient presents with   Hypertension   Aortic atherosclerosis   URI    Patient states she is coughing, congestion, and has yellow mucus. Patient has a ST and itchy eyes. Symptoms began 3 days ago.    HYPERTENSION / HYPERLIPIDEMIA Satisfied with current treatment? yes Duration of hypertension: chronic BP monitoring frequency: not checking BP medication side effects: no Duration of hyperlipidemia: chronic Cholesterol medication side effects: no Cholesterol supplements: none Past cholesterol medications: atorvastatin Medication compliance: excellent compliance Aspirin: yes Recent stressors: no Recurrent headaches: no Visual changes: no Palpitations: no Dyspnea: no Chest pain: no Lower extremity edema: no Dizzy/lightheaded: no  UPPER RESPIRATORY TRACT INFECTION Duration: 2-3 days Worst symptom: sore throat Fever: no Cough: yes Shortness of breath: no Wheezing: no Chest pain: yes Chest tightness: yes Chest congestion: yes Nasal congestion: yes Runny nose: yes Post nasal drip: no Sneezing: no Sore throat: yes Swollen glands: no Sinus pressure: no Headache: yes Face pain: no Toothache: no Ear pain: no  Ear pressure: yes  Eyes red/itching:no Eye drainage/crusting: no  Vomiting: no Rash: no Fatigue: yes Sick contacts: no Strep contacts: no  Context: stable Recurrent sinusitis: no Relief with OTC cold/cough medications: no  Treatments attempted: mucinex   OBESITY Duration: chronic Previous attempts at weight loss: yes Complications of obesity:  Peak weight: 188lbs Weight loss goal: 160lbs Weight loss to date: 2lbs Requesting obesity pharmacotherapy: yes Current weight  loss supplements/medications: no Previous weight loss supplements/meds: no  Relevant past medical, surgical, family and social history reviewed and updated as indicated. Interim medical history since our last visit reviewed. Allergies and medications reviewed and updated.  Review of Systems  Constitutional: Negative.   Respiratory: Negative.    Cardiovascular: Negative.   Gastrointestinal: Negative.   Musculoskeletal: Negative.   Neurological: Negative.   Psychiatric/Behavioral: Negative.     Per HPI unless specifically indicated above     Objective:    BP 131/71   Pulse 72   Temp 98.6 F (37 C)   Wt 186 lb (84.4 kg)   LMP  (LMP Unknown)   SpO2 98%   BMI 30.95 kg/m   Wt Readings from Last 3 Encounters:  08/21/21 186 lb (84.4 kg)  06/19/21 188 lb (85.3 kg)  06/12/21 188 lb 12.8 oz (85.6 kg)    Physical Exam Vitals and nursing note reviewed.  Constitutional:      General: She is not in acute distress.    Appearance: Normal appearance. She is obese. She is not ill-appearing, toxic-appearing or diaphoretic.  HENT:     Head: Normocephalic and atraumatic.     Right Ear: Tympanic membrane, ear canal and external ear normal.     Left Ear: Tympanic membrane, ear canal and external ear normal.     Nose: Congestion and rhinorrhea present.     Mouth/Throat:     Mouth: Mucous membranes are moist.     Pharynx: Oropharynx is clear. No oropharyngeal exudate or posterior oropharyngeal erythema.  Eyes:     General: No scleral icterus.       Right eye: No  discharge.        Left eye: No discharge.     Extraocular Movements: Extraocular movements intact.     Conjunctiva/sclera: Conjunctivae normal.     Pupils: Pupils are equal, round, and reactive to light.  Neck:     Vascular: No carotid bruit.  Cardiovascular:     Rate and Rhythm: Normal rate and regular rhythm.     Pulses: Normal pulses.     Heart sounds: Normal heart sounds. No murmur heard.   No friction rub. No gallop.   Pulmonary:     Effort: Pulmonary effort is normal. No respiratory distress.     Breath sounds: Normal breath sounds. No stridor. No wheezing, rhonchi or rales.  Chest:     Chest wall: No tenderness.  Musculoskeletal:        General: Normal range of motion.     Cervical back: Normal range of motion and neck supple. No rigidity or tenderness.  Lymphadenopathy:     Cervical: No cervical adenopathy.  Skin:    General: Skin is warm and dry.     Capillary Refill: Capillary refill takes less than 2 seconds.     Coloration: Skin is not jaundiced or pale.     Findings: No bruising, erythema, lesion or rash.  Neurological:     General: No focal deficit present.     Mental Status: She is alert and oriented to person, place, and time. Mental status is at baseline.  Psychiatric:        Mood and Affect: Mood normal.        Behavior: Behavior normal.        Thought Content: Thought content normal.        Judgment: Judgment normal.    Results for orders placed or performed in visit on 08/21/21  Rapid Strep screen([facility])   Specimen: Other   Other  Result Value Ref Range   Strep Gp A Ag, IA W/Reflex Negative Negative  Culture, Group A Strep   Other  Result Value Ref Range   Strep A Culture WILL FOLLOW       Assessment & Plan:   Problem List Items Addressed This Visit       Cardiovascular and Mediastinum   Essential hypertension    Under good control on current regimen. Continue current regimen. Continue to monitor. Call with any concerns. Refills given. Labs drawn today.         Relevant Medications   atorvastatin (LIPITOR) 10 MG tablet   spironolactone (ALDACTONE) 25 MG tablet   losartan (COZAAR) 50 MG tablet   Other Relevant Orders   Comprehensive metabolic panel   CBC with Differential/Platelet   Aortic atherosclerosis (HCC)    Under good control on current regimen. Continue current regimen. Continue to monitor. Call with any concerns. Refills given. Labs drawn  today.        Relevant Medications   atorvastatin (LIPITOR) 10 MG tablet   spironolactone (ALDACTONE) 25 MG tablet   losartan (COZAAR) 50 MG tablet     Other   Pure hypercholesterolemia    Under good control on current regimen. Continue current regimen. Continue to monitor. Call with any concerns. Refills given. Labs drawn today.        Relevant Medications   atorvastatin (LIPITOR) 10 MG tablet   spironolactone (ALDACTONE) 25 MG tablet   losartan (COZAAR) 50 MG tablet   Other Relevant Orders   Comprehensive metabolic panel   Lipid Panel w/o Chol/HDL Ratio   CBC with  Differential/Platelet   Obesity (BMI 30-39.9)    Encouraged diet and exercise with goal of losing 1-2lbs per week. Discussed wegovy. Will check on coverage, if covered, will send through to her pharmacy.       Other Visit Diagnoses     Upper respiratory tract infection, unspecified type    -  Primary   Strep negative. Will await COVID. Treat with prednisone burst. Call if not getting better or getting worse.    Relevant Orders   Novel Coronavirus, NAA ([facility])   Rapid Strep screen([facility]) (Completed)        Follow up plan: Return in about 6 months (around 02/21/2022).

## 2021-08-21 NOTE — Assessment & Plan Note (Signed)
Encouraged diet and exercise with goal of losing 1-2lbs per week. Discussed wegovy. Will check on coverage, if covered, will send through to her pharmacy.

## 2021-08-21 NOTE — Patient Instructions (Signed)
Check with insurance to see if [facility] is covered (NOT [place], just [place])

## 2021-08-22 LAB — CBC WITH DIFFERENTIAL/PLATELET
Basophils Absolute: 0 10*3/uL (ref 0.0–0.2)
Basos: 0 %
EOS (ABSOLUTE): 0.2 10*3/uL (ref 0.0–0.4)
Eos: 2 %
Hematocrit: 38.3 % (ref 34.0–46.6)
Hemoglobin: 13 g/dL (ref 11.1–15.9)
Immature Grans (Abs): 0 10*3/uL (ref 0.0–0.1)
Immature Granulocytes: 0 %
Lymphocytes Absolute: 1.6 10*3/uL (ref 0.7–3.1)
Lymphs: 21 %
MCH: 33.7 pg — ABNORMAL HIGH (ref 26.6–33.0)
MCHC: 33.9 g/dL (ref 31.5–35.7)
MCV: 99 fL — ABNORMAL HIGH (ref 79–97)
Monocytes Absolute: 1 10*3/uL — ABNORMAL HIGH (ref 0.1–0.9)
Monocytes: 13 %
Neutrophils Absolute: 4.6 10*3/uL (ref 1.4–7.0)
Neutrophils: 64 %
Platelets: 147 10*3/uL — ABNORMAL LOW (ref 150–450)
RBC: 3.86 x10E6/uL (ref 3.77–5.28)
RDW: 11.8 % (ref 11.7–15.4)
WBC: 7.3 10*3/uL (ref 3.4–10.8)

## 2021-08-22 LAB — COMPREHENSIVE METABOLIC PANEL
ALT: 33 IU/L — ABNORMAL HIGH (ref 0–32)
AST: 36 IU/L (ref 0–40)
Albumin/Globulin Ratio: 1.6 (ref 1.2–2.2)
Albumin: 4.6 g/dL (ref 3.7–4.7)
Alkaline Phosphatase: 115 IU/L (ref 44–121)
BUN/Creatinine Ratio: 12 (ref 12–28)
BUN: 11 mg/dL (ref 8–27)
Bilirubin Total: 1.2 mg/dL (ref 0.0–1.2)
CO2: 24 mmol/L (ref 20–29)
Calcium: 9.8 mg/dL (ref 8.7–10.3)
Chloride: 98 mmol/L (ref 96–106)
Creatinine, Ser: 0.92 mg/dL (ref 0.57–1.00)
Globulin, Total: 2.8 g/dL (ref 1.5–4.5)
Glucose: 120 mg/dL — ABNORMAL HIGH (ref 70–99)
Potassium: 4.3 mmol/L (ref 3.5–5.2)
Sodium: 138 mmol/L (ref 134–144)
Total Protein: 7.4 g/dL (ref 6.0–8.5)
eGFR: 65 mL/min/{1.73_m2} (ref >59)

## 2021-08-22 LAB — LIPID PANEL W/O CHOL/HDL RATIO
Cholesterol, Total: 174 mg/dL (ref 100–199)
HDL: 60 mg/dL (ref >39)
LDL Chol Calc (NIH): 95 mg/dL (ref 0–99)
Triglycerides: 103 mg/dL (ref 0–149)
VLDL Cholesterol Cal: 19 mg/dL (ref 5–40)

## 2021-08-22 LAB — NOVEL CORONAVIRUS, NAA: SARS-CoV-2, NAA: NOT DETECTED

## 2021-08-24 LAB — CULTURE, GROUP A STREP: Strep A Culture: NEGATIVE

## 2021-08-24 LAB — RAPID STREP SCREEN (MED CTR MEBANE ONLY): Strep Gp A Ag, IA W/Reflex: NEGATIVE

## 2021-08-25 ENCOUNTER — Ambulatory Visit
Admission: RE | Admit: 2021-08-25 | Discharge: 2021-08-25 | Disposition: A | Discharge status: Home / Self Care | Payer: Medicare Other | Source: Ambulatory Visit | Attending: Family Medicine | Admitting: Family Medicine

## 2021-08-25 ENCOUNTER — Ambulatory Visit (INDEPENDENT_AMBULATORY_CARE_PROVIDER_SITE_OTHER): Payer: Medicare Other | Admitting: Family Medicine

## 2021-08-25 ENCOUNTER — Encounter: Payer: Self-pay | Admitting: Family Medicine

## 2021-08-25 ENCOUNTER — Ambulatory Visit
Admission: RE | Admit: 2021-08-25 | Discharge: 2021-08-25 | Disposition: A | Discharge status: Home / Self Care | Payer: Medicare Other | Source: Home / Self Care | Attending: Family Medicine | Admitting: Family Medicine

## 2021-08-25 ENCOUNTER — Ambulatory Visit: Payer: Self-pay

## 2021-08-25 VITALS — BP 138/74 | HR 70 | Temp 98.2°F | Wt 188.0 lb

## 2021-08-25 DIAGNOSIS — R0602 Shortness of breath: Secondary | ICD-10-CM | POA: Diagnosis not present

## 2021-08-25 DIAGNOSIS — R059 Cough, unspecified: Secondary | ICD-10-CM | POA: Diagnosis not present

## 2021-08-25 DIAGNOSIS — J040 Acute laryngitis: Secondary | ICD-10-CM

## 2021-08-25 MED ORDER — PREDNISONE 10 MG PO TABS: ORAL_TABLET | ORAL | 0 refills | Status: DC

## 2021-08-25 MED ORDER — AMOXICILLIN-POT CLAVULANATE 250-62.5 MG/5ML PO SUSR: 500.0000 mg | Freq: Two times a day (BID) | ORAL | 0 refills | Status: AC

## 2021-08-25 MED ORDER — BENZONATATE 200 MG PO CAPS: 200.0000 mg | ORAL_CAPSULE | Freq: Two times a day (BID) | ORAL | 1 refills | Status: DC | PRN

## 2021-08-25 NOTE — Progress Notes (Signed)
BP 138/74   Pulse 70   Temp 98.2 F (36.8 C)   Wt 188 lb (85.3 kg)   LMP  (LMP Unknown)   SpO2 98%   BMI 31.28 kg/m    Subjective:    Patient ID: Tamara Fleming, female    DOB: [birth year], 76 y.o.   MRN: [id number]  HPI: Tamara Fleming is a 76 y.o. female  Chief Complaint  Patient presents with   Shortness of Breath    Patient states she is short of breath from coughing so much. Patient is coughing up a lot of mucus.    UPPER RESPIRATORY TRACT INFECTION Duration: about 5-6days Worst symptom: cough and SOB Fever: no Cough: yes Shortness of breath: yes Wheezing: no Chest pain: no Chest tightness: yes Chest congestion: yes Nasal congestion: yes Runny nose: yes Post nasal drip: no Sneezing: yes Sore throat: yes Swollen glands: no Sinus pressure: no Headache: no Face pain: yes Toothache: no Ear pain: no  Ear pressure: no  Eyes red/itching:no Eye drainage/crusting: no  Vomiting: no Rash: no Fatigue: yes Sick contacts: no Strep contacts: no  Context: worse Recurrent sinusitis: no Relief with OTC cold/cough medications: no  Treatments attempted: prednisone   Relevant past medical, surgical, family and social history reviewed and updated as indicated. Interim medical history since our last visit reviewed. Allergies and medications reviewed and updated.  Review of Systems  Constitutional: Negative.   HENT:  Positive for congestion. Negative for dental problem, drooling, ear discharge, ear pain, facial swelling and hearing loss.   Respiratory: Negative.    Cardiovascular: Negative.   Gastrointestinal: Negative.   Musculoskeletal: Negative.   Psychiatric/Behavioral: Negative.     Per HPI unless specifically indicated above     Objective:    BP 138/74   Pulse 70   Temp 98.2 F (36.8 C)   Wt 188 lb (85.3 kg)   LMP  (LMP Unknown)   SpO2 98%   BMI 31.28 kg/m   Wt Readings from Last 3 Encounters:  08/25/21 188 lb (85.3 kg)  08/21/21 186 lb  (84.4 kg)  06/19/21 188 lb (85.3 kg)    Physical Exam Vitals and nursing note reviewed.  Constitutional:      General: She is not in acute distress.    Appearance: Normal appearance. She is not ill-appearing, toxic-appearing or diaphoretic.  HENT:     Head: Normocephalic and atraumatic.     Right Ear: External ear normal.     Left Ear: External ear normal.     Nose: Nose normal.     Mouth/Throat:     Mouth: Mucous membranes are moist.     Pharynx: Oropharynx is clear. No pharyngeal swelling or oropharyngeal exudate.  Eyes:     General: No scleral icterus.       Right eye: No discharge.        Left eye: No discharge.     Extraocular Movements: Extraocular movements intact.     Conjunctiva/sclera: Conjunctivae normal.     Pupils: Pupils are equal, round, and reactive to light.  Neck:     Thyroid: No thyromegaly.     Vascular: No hepatojugular reflux or JVD.     Trachea: No tracheal deviation.  Cardiovascular:     Rate and Rhythm: Normal rate and regular rhythm.     Pulses: Normal pulses.     Heart sounds: Normal heart sounds. No murmur heard.   No friction rub. No gallop.  Pulmonary:     Effort:  Pulmonary effort is normal. No respiratory distress.     Breath sounds: Normal breath sounds. No stridor. No wheezing, rhonchi or rales.  Chest:     Chest wall: No tenderness.  Musculoskeletal:        General: Normal range of motion.     Cervical back: Normal range of motion and neck supple.  Lymphadenopathy:     Cervical: No cervical adenopathy.  Skin:    General: Skin is warm and dry.     Capillary Refill: Capillary refill takes less than 2 seconds.     Coloration: Skin is not jaundiced or pale.     Findings: No bruising, erythema, lesion or rash.  Neurological:     General: No focal deficit present.     Mental Status: She is alert and oriented to person, place, and time. Mental status is at baseline.  Psychiatric:        Mood and Affect: Mood normal.        Behavior:  Behavior normal.        Thought Content: Thought content normal.        Judgment: Judgment normal.    Results for orders placed or performed in visit on 08/21/21  Novel Coronavirus, NAA ([facility])   Specimen: Saline  Result Value Ref Range   SARS-CoV-2, NAA Not Detected Not Detected  Rapid Strep screen([facility])   Specimen: Other   Other  Result Value Ref Range   Strep Gp A Ag, IA W/Reflex Negative Negative  Culture, Group A Strep   Other  Result Value Ref Range   Strep A Culture Negative   Comprehensive metabolic panel  Result Value Ref Range   Glucose 120 (H) 70 - 99 mg/dL   BUN 11 8 - 27 mg/dL   Creatinine, Ser 0.92 0.57 - 1.00 mg/dL   eGFR 65 >59 mL/min/1.73   BUN/Creatinine Ratio 12 12 - 28   Sodium 138 134 - 144 mmol/L   Potassium 4.3 3.5 - 5.2 mmol/L   Chloride 98 96 - 106 mmol/L   CO2 24 20 - 29 mmol/L   Calcium 9.8 8.7 - 10.3 mg/dL   Total Protein 7.4 6.0 - 8.5 g/dL   Albumin 4.6 3.7 - 4.7 g/dL   Globulin, Total 2.8 1.5 - 4.5 g/dL   Albumin/Globulin Ratio 1.6 1.2 - 2.2   Bilirubin Total 1.2 0.0 - 1.2 mg/dL   Alkaline Phosphatase 115 44 - 121 IU/L   AST 36 0 - 40 IU/L   ALT 33 (H) 0 - 32 IU/L  Lipid Panel w/o Chol/HDL Ratio  Result Value Ref Range   Cholesterol, Total 174 100 - 199 mg/dL   Triglycerides 103 0 - 149 mg/dL   HDL 60 >39 mg/dL   VLDL Cholesterol Cal 19 5 - 40 mg/dL   LDL Chol Calc (NIH) 95 0 - 99 mg/dL  CBC with Differential/Platelet  Result Value Ref Range   WBC 7.3 3.4 - 10.8 x10E3/uL   RBC 3.86 3.77 - 5.28 x10E6/uL   Hemoglobin 13.0 11.1 - 15.9 g/dL   Hematocrit 38.3 34.0 - 46.6 %   MCV 99 (H) 79 - 97 fL   MCH 33.7 (H) 26.6 - 33.0 pg   MCHC 33.9 31.5 - 35.7 g/dL   RDW 11.8 11.7 - 15.4 %   Platelets 147 (L) 150 - 450 x10E3/uL   Neutrophils 64 Not Estab. %   Lymphs 21 Not Estab. %   Monocytes 13 Not Estab. %   Eos 2 Not Estab. %  Basos 0 Not Estab. %   Neutrophils Absolute 4.6 1.4 - 7.0 x10E3/uL   Lymphocytes Absolute 1.6  0.7 - 3.1 x10E3/uL   Monocytes Absolute 1.0 (H) 0.1 - 0.9 x10E3/uL   EOS (ABSOLUTE) 0.2 0.0 - 0.4 x10E3/uL   Basophils Absolute 0.0 0.0 - 0.2 x10E3/uL   Immature Granulocytes 0 Not Estab. %   Immature Grans (Abs) 0.0 0.0 - 0.1 x10E3/uL      Assessment & Plan:   Problem List Items Addressed This Visit   None Visit Diagnoses     Laryngitis    -  Primary   Will treat with steroid taper and obtain CXR. If not getting better in 2-3 days, start augmentin. Call with any concerns.    SOB (shortness of breath)       EKG normal.    Relevant Orders   EKG 12-Lead (Completed)   DG Chest 2 View        Follow up plan: Return if symptoms worsen or fail to improve.

## 2021-08-25 NOTE — Telephone Encounter (Signed)
  Chief Complaint: Shortness of breath. Chest tightness Symptoms: on going has gotten worse since seeing Dr. Wynetta Emery Frequency: last week Pertinent Negatives: Patient denies chest pain Disposition: '[x]'$ ED /'[]'$ Urgent Care (no appt availability in office) / '[]'$ Appointment(In office/virtual)/ '[]'$  [facility]/ '[]'$ Home Care/ '[]'$ Refused Recommended Disposition /'[]'$ [place] [facility]/ '[]'$  Follow-up with PCP Additional Notes: Pt refuses disposition. PT wants to see Dr. Wynetta Emery. Pt is speaking in phrases and sounds a bit weak. Reason for Disposition  [1] MODERATE difficulty breathing (e.g., speaks in phrases, SOB even at rest, pulse 100-120) AND [2] NEW-onset or WORSE than normal  Answer Assessment - Initial Assessment Questions 1. RESPIRATORY STATUS: "Describe your breathing?" (e.g., wheezing, shortness of breath, unable to speak, severe coughing)      SOB 2. ONSET: "When did this breathing problem begin?"      Weds last week 3. PATTERN "Does the difficult breathing come and go, or has it been constant since it started?"      Constant and worsening 4. SEVERITY: "How bad is your breathing?" (e.g., mild, moderate, severe)    - MILD: No SOB at rest, mild SOB with walking, speaks normally in sentences, can lie down, no retractions, pulse < 100.    - MODERATE: SOB at rest, SOB with minimal exertion and prefers to sit, cannot lie down flat, speaks in phrases, mild retractions, audible wheezing, pulse 100-120.    - SEVERE: Very SOB at rest, speaks in single words, struggling to breathe, sitting hunched forward, retractions, pulse > 120      moderate 5. RECURRENT SYMPTOM: "Have you had difficulty breathing before?" If Yes, ask: "When was the last time?" and "What happened that time?"      na 6. CARDIAC HISTORY: "Do you have any history of heart disease?" (e.g., heart attack, angina, bypass surgery, angioplasty)     7. LUNG HISTORY: "Do you have any history of lung disease?"  (e.g., pulmonary  embolus, asthma, emphysema)      8. CAUSE: "What do you think is causing the breathing problem?"      Infection 9. OTHER SYMPTOMS: "Do you have any other symptoms? (e.g., dizziness, runny nose, cough, chest pain, fever)      10. O2 SATURATION MONITOR:  "Do you use an oxygen saturation monitor (pulse oximeter) at home?" If Yes, "What is your reading (oxygen level) today?" "What is your usual oxygen saturation reading?" (e.g., 95%)        11. PREGNANCY: "Is there any chance you are pregnant?" "When was your last menstrual period?"        12. TRAVEL: "Have you traveled out of the country in the last month?" (e.g., travel history, exposures)  Protocols used: Breathing Difficulty-A-AH

## 2021-08-25 NOTE — Progress Notes (Signed)
Interpreted by me today. NSR at 62bpm, no ST segment changes

## 2021-08-28 DIAGNOSIS — M47817 Spondylosis without myelopathy or radiculopathy, lumbosacral region: Secondary | ICD-10-CM | POA: Diagnosis not present

## 2021-08-28 DIAGNOSIS — Z79891 Long term (current) use of opiate analgesic: Secondary | ICD-10-CM | POA: Diagnosis not present

## 2021-08-28 DIAGNOSIS — M545 Low back pain, unspecified: Secondary | ICD-10-CM | POA: Diagnosis not present

## 2021-09-25 DIAGNOSIS — M545 Low back pain, unspecified: Secondary | ICD-10-CM | POA: Diagnosis not present

## 2021-09-25 DIAGNOSIS — Z79891 Long term (current) use of opiate analgesic: Secondary | ICD-10-CM | POA: Diagnosis not present

## 2021-10-03 ENCOUNTER — Encounter: Payer: Self-pay | Admitting: Intensive Care

## 2021-10-03 ENCOUNTER — Emergency Department: Payer: Medicare Other

## 2021-10-03 ENCOUNTER — Emergency Department
Admission: EM | Admit: 2021-10-03 | Discharge: 2021-10-03 | Disposition: A | Discharge status: Home / Self Care | Payer: Medicare Other | Attending: Emergency Medicine | Admitting: Emergency Medicine

## 2021-10-03 ENCOUNTER — Other Ambulatory Visit: Payer: Self-pay

## 2021-10-03 DIAGNOSIS — R0789 Other chest pain: Secondary | ICD-10-CM | POA: Diagnosis not present

## 2021-10-03 DIAGNOSIS — M546 Pain in thoracic spine: Secondary | ICD-10-CM | POA: Insufficient documentation

## 2021-10-03 DIAGNOSIS — R079 Chest pain, unspecified: Secondary | ICD-10-CM

## 2021-10-03 DIAGNOSIS — R000 Tachycardia, unspecified: Secondary | ICD-10-CM | POA: Diagnosis not present

## 2021-10-03 DIAGNOSIS — I10 Essential (primary) hypertension: Secondary | ICD-10-CM | POA: Insufficient documentation

## 2021-10-03 DIAGNOSIS — I700 Atherosclerosis of aorta: Secondary | ICD-10-CM | POA: Diagnosis not present

## 2021-10-03 LAB — CBC
HCT: 41.9 % (ref 36.0–46.0)
Hemoglobin: 14 g/dL (ref 12.0–15.0)
MCH: 33.9 pg (ref 26.0–34.0)
MCHC: 33.4 g/dL (ref 30.0–36.0)
MCV: 101.5 fL — ABNORMAL HIGH (ref 80.0–100.0)
Platelets: 153 10*3/uL (ref 150–400)
RBC: 4.13 MIL/uL (ref 3.87–5.11)
RDW: 12.1 % (ref 11.5–15.5)
WBC: 4.8 10*3/uL (ref 4.0–10.5)
nRBC: 0 % (ref 0.0–0.2)

## 2021-10-03 LAB — BASIC METABOLIC PANEL
Anion gap: 11 (ref 5–15)
BUN: 17 mg/dL (ref 8–23)
CO2: 20 mmol/L — ABNORMAL LOW (ref 22–32)
Calcium: 9.6 mg/dL (ref 8.9–10.3)
Chloride: 107 mmol/L (ref 98–111)
Creatinine, Ser: 1.06 mg/dL — ABNORMAL HIGH (ref 0.44–1.00)
GFR, Estimated: 55 mL/min — ABNORMAL LOW (ref >60)
Glucose, Bld: 142 mg/dL — ABNORMAL HIGH (ref 70–99)
Potassium: 4.1 mmol/L (ref 3.5–5.1)
Sodium: 138 mmol/L (ref 135–145)

## 2021-10-03 LAB — TROPONIN I (HIGH SENSITIVITY)
Troponin I (High Sensitivity): 23 ng/L — ABNORMAL HIGH (ref <18)
Troponin I (High Sensitivity): 35 ng/L — ABNORMAL HIGH (ref <18)

## 2021-10-03 NOTE — Discharge Instructions (Signed)
Your EKG, chest x-ray, and lab tests were all reassuring today.  We discussed your symptoms with your cardiologist who recommends following up in the office for further evaluation.

## 2021-10-03 NOTE — ED Notes (Signed)
Pt reports around 5, her heart began to race causing her to wake up. Pt attempted to check her HR but it was too fast. States her HR slowed down while en route. Pt had pain between her shoulder blades but has no improved.

## 2021-10-03 NOTE — ED Provider Notes (Signed)
[facility] Provider Note    Event Date/Time   First MD Initiated Contact with Patient 10/03/21 [phone]     (approximate)   History   Chief Complaint: Tachycardia   HPI  Tamara Fleming is a 76 y.o. female with a history of chronic pain, anxiety, GERD, hypertension who comes the ED due to heart racing and upper back pain that started at 5:00 AM while she was asleep.  Woke her up from sleep.  Felt sharp.  Lasted for about 30 minutes and then resolved spontaneously.  On arrival to the ED she reports that she is feeling back to normal.  No shortness of breath currently, no chest pain.  No dizziness or syncope.  No aggravating or alleviating factors.  Not exertional, not pleuritic.  Sees cardiology Dr. Saralyn Pilar.     Physical Exam   Triage Vital Signs: ED Triage Vitals  Enc Vitals Group     BP 10/03/21 0818 135/65     Pulse Rate 10/03/21 0818 70     Resp 10/03/21 0818 12     Temp 10/03/21 0818 97.8 F (36.6 C)     Temp src --      SpO2 10/03/21 0818 97 %     Weight 10/03/21 0727 180 lb (81.6 kg)     Height 10/03/21 0727 '5\' 5"'$  (1.651 m)     Head Circumference --      Peak Flow --      Pain Score 10/03/21 0727 3     Pain Loc --      Pain Edu? --      Excl. in [place]? --     Most recent vital signs: Vitals:   10/03/21 0930 10/03/21 1000  BP: 115/60 137/67  Pulse: 66 66  Resp: 12 18  Temp:    SpO2: 96% 99%    General: Awake, no distress.  CV:  Good peripheral perfusion.  Regular rate and rhythm.  Normal peripheral pulses Resp:  Normal effort.  Clear to auscultation bilaterally Abd:  No distention.  Soft nontender Other:  Moist oral mucosa .   ED Results / Procedures / Treatments   Labs (all labs ordered are listed, but only abnormal results are displayed) Labs Reviewed  BASIC METABOLIC PANEL - Abnormal; Notable for the following components:      Result Value   CO2 20 (*)    Glucose, Bld 142 (*)    Creatinine, Ser 1.06 (*)    GFR,  Estimated 55 (*)    All other components within normal limits  CBC - Abnormal; Notable for the following components:   MCV 101.5 (*)    All other components within normal limits  TROPONIN I (HIGH SENSITIVITY) - Abnormal; Notable for the following components:   Troponin I (High Sensitivity) 23 (*)    All other components within normal limits  TROPONIN I (HIGH SENSITIVITY) - Abnormal; Notable for the following components:   Troponin I (High Sensitivity) 35 (*)    All other components within normal limits     EKG Interpreted by me Normal sinus rhythm rate of 89.  Normal axis and intervals.  Normal QRS ST segments and T waves.  No ischemic changes.   RADIOLOGY Chest x-ray interpreted by me, appears normal.  Radiology report reviewed.   PROCEDURES:  Procedures   MEDICATIONS ORDERED IN ED: Medications - No data to display   IMPRESSION / MDM / [place] / ED COURSE  I reviewed the triage  vital signs and the nursing notes.                              Differential diagnosis includes, but is not limited to, pneumothorax, pneumonia, pleural effusion, pulmonary edema, non-STEMI, GERD, muscle spasm  Patient's presentation is most consistent with acute presentation with potential threat to life or bodily function.  Patient presents with posterior chest pain and palpitations starting during sleep at rest.  No exertional or pleuritic symptoms.  EKG chest x-ray and labs are all unremarkable.  Serial troponins are 23 and 35.  Discussed with her cardiologist who agrees this is appropriate for follow-up in clinic.   Considering the patient's symptoms, medical history, and physical examination today, I have low suspicion for ACS, PE, TAD, pneumothorax, carditis, mediastinitis, pneumonia, CHF, or sepsis.        FINAL CLINICAL IMPRESSION(S) / ED DIAGNOSES   Final diagnoses:  Nonspecific chest pain     Rx / DC Orders   ED Discharge Orders     None        Note:   This document was prepared using Dragon voice recognition software and may include unintentional dictation errors.   Carrie Mew, MD 10/03/21 1134

## 2021-10-03 NOTE — ED Triage Notes (Signed)
Patient reports her "heart was racing this morning and felt like someone was stabbing her in the back between shoulder blades"

## 2021-10-06 ENCOUNTER — Telehealth: Payer: Self-pay | Admitting: *Deleted

## 2021-10-06 NOTE — Telephone Encounter (Signed)
Transition Care Management Unsuccessful Follow-up Telephone Call  Date of discharge and from where:  [facility] 10-03-2021   Attempts:  1st Attempt  Reason for unsuccessful TCM follow-up call:  Left voice message

## 2021-10-08 NOTE — Telephone Encounter (Signed)
Transition Care Management Unsuccessful Follow-up Telephone Call  Date of discharge and from where:  [facility] 10-03-2021  Attempts:  2nd Attempt  Reason for unsuccessful TCM follow-up call:  Left voice message

## 2021-10-09 NOTE — Telephone Encounter (Signed)
Transition Care Management Unsuccessful Follow-up Telephone Call  Date of discharge and from where:  [facility] 10-03-2021   Attempts:  3rd Attempt  Reason for unsuccessful TCM follow-up call:  Left voice message

## 2021-10-23 DIAGNOSIS — M47817 Spondylosis without myelopathy or radiculopathy, lumbosacral region: Secondary | ICD-10-CM | POA: Diagnosis not present

## 2021-10-23 DIAGNOSIS — M545 Low back pain, unspecified: Secondary | ICD-10-CM | POA: Diagnosis not present

## 2021-10-23 DIAGNOSIS — Z79891 Long term (current) use of opiate analgesic: Secondary | ICD-10-CM | POA: Diagnosis not present

## 2021-11-14 ENCOUNTER — Ambulatory Visit: Payer: Self-pay

## 2021-11-14 DIAGNOSIS — R0989 Other specified symptoms and signs involving the circulatory and respiratory systems: Secondary | ICD-10-CM | POA: Diagnosis not present

## 2021-11-14 DIAGNOSIS — R0602 Shortness of breath: Secondary | ICD-10-CM | POA: Diagnosis not present

## 2021-11-14 NOTE — Telephone Encounter (Signed)
  Chief Complaint: SOB with exertion Symptoms: head congestion, Runny nose, dry cough. SOB Frequency: today Pertinent Negatives: Patient denies Fever Disposition: '[]'$ ED /'[x]'$ Urgent Care (no appt availability in office) / '[]'$ Appointment(In office/virtual)/ '[]'$  [place] [facility]/ '[]'$ Home Care/ '[]'$ Refused Recommended Disposition /'[]'$ [facility] [place] [facility]/ '[]'$  Follow-up with PCP Additional Notes: PT states that yesterday she had started a head cold, but it seems to be going into her chest. Pt reports Sob with talking or walking. Pulse ox readings were 94-97 pulse of 78.  Reason for Disposition  [1] MILD difficulty breathing (e.g., minimal/no SOB at rest, SOB with walking, pulse <100) AND [2] NEW-onset or WORSE than normal  Answer Assessment - Initial Assessment Questions 1. RESPIRATORY STATUS: "Describe your breathing?" (e.g., wheezing, shortness of breath, unable to speak, severe coughing)      When taking becomes SOB 2. ONSET: "When did this breathing problem begin?"      Congestion started yesterday morning.  3. PATTERN "Does the difficult breathing come and go, or has it been constant since it started?"   SOB when talking  4. SEVERITY: "How bad is your breathing?" (e.g., mild, moderate, severe)    - MILD: No SOB at rest, mild SOB with walking, speaks normally in sentences, can lie down, no retractions, pulse < 100.    - MODERATE: SOB at rest, SOB with minimal exertion and prefers to sit, cannot lie down flat, speaks in phrases, mild retractions, audible wheezing, pulse 100-120.    - SEVERE: Very SOB at rest, speaks in single words, struggling to breathe, sitting hunched forward, retractions, pulse > 120      Mild 5. RECURRENT SYMPTOM: "Have you had difficulty breathing before?" If Yes, ask: "When was the last time?" and "What happened that time?"      yes 6. CARDIAC HISTORY: "Do you have any history of heart disease?" (e.g., heart attack, angina, bypass surgery, angioplasty)      Tach 7.  LUNG HISTORY: "Do you have any history of lung disease?"  (e.g., pulmonary embolus, asthma, emphysema)     no 8. CAUSE: "What do you think is causing the breathing problem?"      Head cold that is going to chest 9. OTHER SYMPTOMS: "Do you have any other symptoms? (e.g., dizziness, runny nose, cough, chest pain, fever)     Runny nose, stuffy head 10. O2 SATURATION MONITOR:  "Do you use an oxygen saturation monitor (pulse oximeter) at home?" If Yes, ask: "What is your reading (oxygen level) today?" "What is your usual oxygen saturation reading?" (e.g., 95%)       94-97 11. PREGNANCY: "Is there any chance you are pregnant?" "When was your last menstrual period?"       na 12. TRAVEL: "Have you traveled out of the country in the last month?" (e.g., travel history, exposures)       na  Protocols used: Breathing Difficulty-A-AH

## 2021-11-14 NOTE — Telephone Encounter (Signed)
Agree with [facility]

## 2021-11-14 NOTE — Telephone Encounter (Signed)
Called and notified patient's of Dr. Durenda Age response. Patient states she is planning to go to UC.

## 2021-11-19 DIAGNOSIS — H2513 Age-related nuclear cataract, bilateral: Secondary | ICD-10-CM | POA: Diagnosis not present

## 2021-11-25 DIAGNOSIS — M545 Low back pain, unspecified: Secondary | ICD-10-CM | POA: Diagnosis not present

## 2021-11-25 DIAGNOSIS — Z79891 Long term (current) use of opiate analgesic: Secondary | ICD-10-CM | POA: Diagnosis not present

## 2021-11-25 DIAGNOSIS — M47817 Spondylosis without myelopathy or radiculopathy, lumbosacral region: Secondary | ICD-10-CM | POA: Diagnosis not present

## 2021-11-27 ENCOUNTER — Ambulatory Visit (INDEPENDENT_AMBULATORY_CARE_PROVIDER_SITE_OTHER): Payer: Medicare Other | Admitting: Dermatology

## 2021-11-27 DIAGNOSIS — L578 Other skin changes due to chronic exposure to nonionizing radiation: Secondary | ICD-10-CM | POA: Diagnosis not present

## 2021-11-27 DIAGNOSIS — R202 Paresthesia of skin: Secondary | ICD-10-CM | POA: Diagnosis not present

## 2021-11-27 DIAGNOSIS — Z85828 Personal history of other malignant neoplasm of skin: Secondary | ICD-10-CM

## 2021-11-27 DIAGNOSIS — L570 Actinic keratosis: Secondary | ICD-10-CM | POA: Diagnosis not present

## 2021-11-27 DIAGNOSIS — L920 Granuloma annulare: Secondary | ICD-10-CM | POA: Diagnosis not present

## 2021-11-27 DIAGNOSIS — L820 Inflamed seborrheic keratosis: Secondary | ICD-10-CM | POA: Diagnosis not present

## 2021-11-27 MED ORDER — DAPSONE 7.5 % EX GEL: CUTANEOUS | 2 refills | Status: DC

## 2021-11-27 NOTE — Progress Notes (Signed)
Follow-Up Visit   Subjective  Tamara Fleming is a 76 y.o. female who presents for the following: Skin Problem (The patient has spots, moles and lesions to be evaluated, some may be new or changing and the patient has concerns that these could be cancer. ). Recheck rash on her legs and arms pt had a biopsy years ago which showed Granuloma Annulare.  Patient c/o itchy skin with no rash on her back for several months.   The following portions of the chart were reviewed this encounter and updated as appropriate:   Tobacco  Allergies  Meds  Problems  Med Hx  Surg Hx  Fam Hx     Review of Systems:  No other skin or systemic complaints except as noted in HPI or Assessment and Plan.  Objective  Well appearing patient in no apparent distress; mood and affect are within normal limits.  A focused examination was performed including face,legs,arms. Relevant physical exam findings are noted in the Assessment and Plan.  left side burn, left medial cheek (4) Stuck-on, waxy, tan-brown papules-- Discussed benign etiology and prognosis.  left nasal tip Erythematous thin papules/macules with gritty scale.   lower legs, arms Annular plaques on the lower legs   Left Upper Back Clear skin    Assessment & Plan  Inflamed seborrheic keratosis (4) left side burn, left medial cheek Symptomatic, irritating, patient would like treated.  Destruction of lesion - left side burn, left medial cheek Complexity: simple   Destruction method: cryotherapy   Informed consent: discussed and consent obtained   Timeout:  patient name, date of birth, surgical site, and procedure verified Lesion destroyed using liquid nitrogen: Yes   Region frozen until ice ball extended beyond lesion: Yes   Outcome: patient tolerated procedure well with no complications   Post-procedure details: wound care instructions given    AK (actinic keratosis) left nasal tip Actinic keratoses are precancerous spots that appear  secondary to cumulative UV radiation exposure/sun exposure over time. They are chronic with expected duration over 1 year. A portion of actinic keratoses will progress to squamous cell carcinoma of the skin. It is not possible to reliably predict which spots will progress to skin cancer and so treatment is recommended to prevent development of skin cancer.  Recommend daily broad spectrum sunscreen SPF 30+ to sun-exposed areas, reapply every 2 hours as needed.  Recommend staying in the shade or wearing long sleeves, sun glasses (UVA+UVB protection) and wide brim hats (4-inch brim around the entire circumference of the hat). Call for new or changing lesions.   Destruction of lesion - left nasal tip Complexity: simple   Destruction method: cryotherapy   Informed consent: discussed and consent obtained   Timeout:  patient name, date of birth, surgical site, and procedure verified Lesion destroyed using liquid nitrogen: Yes   Region frozen until ice ball extended beyond lesion: Yes   Outcome: patient tolerated procedure well with no complications   Post-procedure details: wound care instructions given    Granuloma annulare Present for > 15 years, now spreading lower legs, arms Granuloma annulare is a chronic benign skin condition characterized by pink smooth bumps or ring-like plaques most commonly appearing over the joints and the backs of the hands/feet. Its cause is not known, and most episodes of granuloma annulare clear up after a few years, with or without treatment.  Chronic and persistent condition with duration or expected duration over one year. Condition is symptomatic / bothersome to patient.  Flared.  Discussed Isotretinoin, Dapsone, Dapsone cream, topical steroid cream  or ILK may help but may recur.  Recommend Dapsone cream apply to skin once a day  Related Medications Dapsone 7.5 % GEL Apply to legs once a day  Notalgia paresthetica Left Upper Back Notalgia paresthetica is  a chronic condition affecting the skin of the back in which a pinched nerve along the spine causes itching or changes in sensation in an area of skin. This is usually accompanied by chronic rubbing or scratching often leaving the area of skin discolored and thickened. There is no cure, but there are some treatments which may help control the itch.   Over the counter (non-prescription) treatments for notalgia paresthetica include numbing creams like pramoxine or lidocaine which temporarily reduce itch or Capsaicin-containing creams which cause a burning sensation but which sometimes over time will reset the nerves to stop producing itch.  If you choose to use Capsaicin cream, it is recommended to use it 5 times daily for 1 week followed by 3 times daily for 3-6 weeks. You may have to continue using it long-term. For severe cases, there are some prescription cream or pill options which may help. Other treatment options include: - Transcutaneous Electrical Nerve Stimulation (TENS) - Gabapentin 300-900 mg daily po - Amitriptyline orally - Paravertebral local anesthetic block - intralesional Botulinum toxin A   Start [facility] itch cream  Amitriptyline: 5% Gabapentin: 10% Lidocaine: 5% Vehicle: Cream  History of Basal Cell Carcinoma of the Skin Left chin  - No evidence of recurrence today - Recommend regular full body skin exams - Recommend daily broad spectrum sunscreen SPF 30+ to sun-exposed areas, reapply every 2 hours as needed.  - Call if any new or changing lesions are noted between office visits   Actinic Damage - chronic, secondary to cumulative UV radiation exposure/sun exposure over time - diffuse scaly erythematous macules with underlying dyspigmentation - Recommend daily broad spectrum sunscreen SPF 30+ to sun-exposed areas, reapply every 2 hours as needed.  - Recommend staying in the shade or wearing long sleeves, sun glasses (UVA+UVB protection) and wide brim hats (4-inch  brim around the entire circumference of the hat). - Call for new or changing lesions.  Return in about 3 months (around 02/27/2022) for ISK, AKS .  IMarye Round, CMA, am acting as scribe for Sarina Ser, MD .  Documentation: I have reviewed the above documentation for accuracy and completeness, and I agree with the above.  Sarina Ser, MD

## 2021-11-27 NOTE — Patient Instructions (Addendum)
Cryotherapy Aftercare  Wash gently with soap and water everyday.   Apply Vaseline and Band-Aid daily until healed.    Instructions for [facility] Medications  One or more of your medications was sent to the [facility] mail order compounding pharmacy. You will receive an email from them and can purchase the medicine through that link. It will then be mailed to your home at the address you confirmed. If for any reason you do not receive an email from them, please check your spam folder. If you still do not find the email, please let us know. [facility] phone number is [phone].      Due to recent changes in healthcare laws, you may see results of your pathology and/or laboratory studies on MyChart before the doctors have had a chance to review them. We understand that in some cases there may be results that are confusing or concerning to you. Please understand that not all results are received at the same time and often the doctors may need to interpret multiple results in order to provide you with the best plan of care or course of treatment. Therefore, we ask that you please give [place] 2 business days to thoroughly review all your results before contacting the office for clarification. Should we see a critical lab result, you will be contacted sooner.   If You Need Anything After Your Visit  If you have any questions or concerns for your doctor, please call our main line at [phone] and press option 4 to reach your doctor's medical assistant. If no one answers, please leave a voicemail as directed and we will return your call as soon as possible. Messages left after 4 pm will be answered the following business day.   You may also send [place] a message via [place]. We typically respond to MyChart messages within 1-2 business days.  For prescription refills, please ask your pharmacy to contact our office. Our fax number is [phone].  If you have an urgent issue when the  clinic is closed that cannot wait until the next business day, you can page your doctor at the number below.    Please note that while we do our best to be available for urgent issues outside of office hours, we are not available 24/7.   If you have an urgent issue and are unable to reach [place], you may choose to seek medical care at your doctor's office, retail clinic, urgent care center, or emergency room.  If you have a medical emergency, please immediately call 911 or go to the emergency department.  Pager Numbers  - Dr. Nehemiah Massed: [phone]  - Dr. Laurence Ferrari: [phone]  - Dr. Nicole Kindred: [phone]  In the event of inclement weather, please call our main line at [phone] for an update on the status of any delays or closures.  Dermatology Medication Tips: Please keep the boxes that topical medications come in in order to help keep track of the instructions about where and how to use these. Pharmacies typically print the medication instructions only on the boxes and not directly on the medication tubes.   If your medication is too expensive, please contact our office at [phone] option 4 or send [place] a message through [place].   We are unable to tell what your co-pay for medications will be in advance as this is different depending on your insurance coverage. However, we may be able to find a substitute medication at lower cost or fill out paperwork to get insurance  needed medication.   If a prior authorization is required to get your medication covered by your insurance company, please allow us 1-2 business days to complete this process.  Drug prices often vary depending on where the prescription is filled and some pharmacies may offer cheaper prices.  The website www.goodrx.com contains coupons for medications through different pharmacies. The prices here do not account for what the cost may be with help from insurance (it may be cheaper with your insurance), but the website can  give you the price if you did not use any insurance.  - You can print the associated coupon and take it with your prescription to the pharmacy.  - You may also stop by our office during regular business hours and pick up a GoodRx coupon card.  - If you need your prescription sent electronically to a different pharmacy, notify our office through [facility] MyChart or by phone at [phone] option 4.     Si Usted Necesita Algo Despus de Su Visita  Tambin puede enviarnos un mensaje a travs de MyChart. Por lo general respondemos a los mensajes de MyChart en el transcurso de 1 a 2 das hbiles.  Para renovar recetas, por favor pida a su farmacia que se ponga en contacto con nuestra oficina. Nuestro nmero de fax es el [phone].  Si tiene un asunto urgente cuando la clnica est cerrada y que no puede esperar hasta el siguiente da hbil, puede llamar/localizar a su doctor(a) al nmero que aparece a continuacin.   Por favor, tenga en cuenta que aunque hacemos todo lo posible para estar disponibles para asuntos urgentes fuera del horario de oficina, no estamos disponibles las 24 horas del da, los 7 das de la semana.   Si tiene un problema urgente y no puede comunicarse con nosotros, puede optar por buscar atencin mdica  en el consultorio de su doctor(a), en una clnica privada, en un centro de atencin urgente o en una sala de emergencias.  Si tiene una emergencia mdica, por favor llame inmediatamente al 911 o vaya a la sala de emergencias.  Nmeros de bper  - Dr. Kowalski: [phone]  - Dra. Moye: [phone]  - Dra. Stewart: [phone]  En caso de inclemencias del tiempo, por favor llame a nuestra lnea principal al [phone] para una actualizacin sobre el estado de cualquier retraso o cierre.  Consejos para la medicacin en dermatologa: Por favor, guarde las cajas en las que vienen los medicamentos de uso tpico para ayudarle a seguir las instrucciones sobre  dnde y cmo usarlos. Las farmacias generalmente imprimen las instrucciones del medicamento slo en las cajas y no directamente en los tubos del medicamento.   Si su medicamento es muy caro, por favor, pngase en contacto con nuestra oficina llamando al [phone] y presione la opcin 4 o envenos un mensaje a travs de MyChart.   No podemos decirle cul ser su copago por los medicamentos por adelantado ya que esto es diferente dependiendo de la cobertura de su seguro. Sin embargo, es posible que podamos encontrar un medicamento sustituto a menor costo o llenar un formulario para que el seguro cubra el medicamento que se considera necesario.   Si se requiere una autorizacin previa para que su compaa de seguros cubra su medicamento, por favor permtanos de 1 a 2 das hbiles para completar este proceso.  Los precios de los medicamentos varan con frecuencia dependiendo del lugar de dnde se surte la receta y alguna farmacias pueden ofrecer precios ms baratos.    ms baratos.  El sitio web www.goodrx.com tiene cupones para medicamentos de Airline pilot. Los precios aqu no tienen en cuenta lo que podra costar con la ayuda del seguro (puede ser ms barato con su seguro), pero el sitio web puede darle el precio si no utiliz Research scientist (physical sciences).  - Puede imprimir el cupn correspondiente y llevarlo con su receta a la farmacia.  - Tambin puede pasar por nuestra oficina durante el horario de atencin regular y Charity fundraiser una tarjeta de cupones de GoodRx.  - Si necesita que su receta se enve electrnicamente a una farmacia diferente, informe a nuestra oficina a travs de MyChart de [place] o por telfono llamando al [phone] y presione la opcin 4.

## 2021-11-28 ENCOUNTER — Other Ambulatory Visit: Payer: Self-pay | Admitting: Family Medicine

## 2021-11-28 NOTE — Telephone Encounter (Signed)
rx was sent to pharmacy on 08/21/21 #90/1 Requested Prescriptions  Pending Prescriptions Disp Refills  . spironolactone (ALDACTONE) 25 MG tablet [Pharmacy Med Name: SPIRONOLACTONE 25 MG TAB] 90 tablet 1    Sig: TAKE 1 TABLET BY MOUTH ONCE DAILY     Cardiovascular: Diuretics - Aldosterone Antagonist Failed - 11/28/2021  1:01 PM      Failed - Cr in normal range and within 180 days    Creatinine  Date Value Ref Range Status  09/05/2012 0.74 0.60 - 1.30 mg/dL Final   Creat  Date Value Ref Range Status  02/26/2012 0.75 0.50 - 1.10 mg/dL Final   Creatinine, Ser  Date Value Ref Range Status  10/03/2021 1.06 (H) 0.44 - 1.00 mg/dL Final         Passed - K in normal range and within 180 days    Potassium  Date Value Ref Range Status  10/03/2021 4.1 3.5 - 5.1 mmol/L Final  09/05/2012 3.9 3.5 - 5.1 mmol/L Final         Passed - Na in normal range and within 180 days    Sodium  Date Value Ref Range Status  10/03/2021 138 135 - 145 mmol/L Final  08/21/2021 138 134 - 144 mmol/L Final  09/05/2012 139 136 - 145 mmol/L Final         Passed - eGFR is 30 or above and within 180 days    EGFR (African American)  Date Value Ref Range Status  09/05/2012 >60  Final   GFR calc Af Amer  Date Value Ref Range Status  12/28/2019 85 >59 mL/min/1.73 Final    Comment:    **[facility] currently reports eGFR in compliance with the current**   recommendations of the [employer]. [facility] will   update reporting as new guidelines are published from the NKF-ASN   Task force.    EGFR (Non-African Amer.)  Date Value Ref Range Status  09/05/2012 >60  Final    Comment:    eGFR values <70m/min/1.73 m2 may be an indication of chronic kidney disease (CKD). Calculated eGFR is useful in patients with stable renal function. The eGFR calculation will not be reliable in acutely ill patients when serum creatinine is changing rapidly. It is not useful in  patients on dialysis. The eGFR calculation  may not be applicable to patients at the low and high extremes of body sizes, pregnant women, and vegetarians.    GFR, Estimated  Date Value Ref Range Status  10/03/2021 55 (L) >60 mL/min Final    Comment:    (NOTE) Calculated using the CKD-EPI Creatinine Equation (2021)    GFR  Date Value Ref Range Status  06/29/2012 69.22 >60.00 mL/min Final   eGFR  Date Value Ref Range Status  08/21/2021 65 >59 mL/min/1.73 Final         Passed - Last BP in normal range    BP Readings from Last 1 Encounters:  10/03/21 (!) 128/58         Passed - Valid encounter within last 6 months    Recent Outpatient Visits          3 months ago [facility] Megan P, DO   3 months ago Upper respiratory tract infection, unspecified type   [facility]   5 months ago Generalized abdominal pain   [facility]   5 months ago Generalized abdominal pain   [facility] Venita Lick, NP   9 months ago Essential hypertension   [facility] Valerie Roys, DO      Future Appointments            In 2 months Wynetta Emery, Barb Merino, DO [employer], [place]   In 3 months Ralene Bathe, MD [place]

## 2021-11-29 ENCOUNTER — Encounter: Payer: Self-pay | Admitting: Dermatology

## 2021-12-17 ENCOUNTER — Other Ambulatory Visit: Payer: Self-pay | Admitting: Family Medicine

## 2021-12-18 NOTE — Telephone Encounter (Signed)
Requested Prescriptions  Pending Prescriptions Disp Refills  . losartan (COZAAR) 50 MG tablet [Pharmacy Med Name: LOSARTAN POTASSIUM 50 MG TAB] 90 tablet 0    Sig: TAKE 1 TABLET BY MOUTH ONCE DAILY     Cardiovascular:  Angiotensin Receptor Blockers Failed - 12/17/2021 10:56 AM      Failed - Cr in normal range and within 180 days    Creatinine  Date Value Ref Range Status  09/05/2012 0.74 0.60 - 1.30 mg/dL Final   Creat  Date Value Ref Range Status  02/26/2012 0.75 0.50 - 1.10 mg/dL Final   Creatinine, Ser  Date Value Ref Range Status  10/03/2021 1.06 (H) 0.44 - 1.00 mg/dL Final         Passed - K in normal range and within 180 days    Potassium  Date Value Ref Range Status  10/03/2021 4.1 3.5 - 5.1 mmol/L Final  09/05/2012 3.9 3.5 - 5.1 mmol/L Final         Passed - Patient is not pregnant      Passed - Last BP in normal range    BP Readings from Last 1 Encounters:  10/03/21 (!) 128/58         Passed - Valid encounter within last 6 months    Recent Outpatient Visits          3 months ago [place], Megan P, DO   3 months ago Upper respiratory tract infection, unspecified type   [employer], [place] P, DO   6 months ago Generalized abdominal pain   [place], Register T, NP   6 months ago Generalized abdominal pain   [place], Henrine Screws T, NP   10 months ago Essential hypertension   [place], Barb Merino, DO      Future Appointments            In 2 months Wynetta Emery, Barb Merino, DO [employer], [place]   In 2 months Nehemiah Massed, Monia Sabal, MD [place]

## 2021-12-23 DIAGNOSIS — M47817 Spondylosis without myelopathy or radiculopathy, lumbosacral region: Secondary | ICD-10-CM | POA: Diagnosis not present

## 2021-12-23 DIAGNOSIS — M545 Low back pain, unspecified: Secondary | ICD-10-CM | POA: Diagnosis not present

## 2021-12-23 DIAGNOSIS — Z79891 Long term (current) use of opiate analgesic: Secondary | ICD-10-CM | POA: Diagnosis not present

## 2021-12-23 DIAGNOSIS — M47814 Spondylosis without myelopathy or radiculopathy, thoracic region: Secondary | ICD-10-CM | POA: Diagnosis not present

## 2021-12-23 DIAGNOSIS — M791 Myalgia, unspecified site: Secondary | ICD-10-CM | POA: Diagnosis not present

## 2021-12-31 ENCOUNTER — Ambulatory Visit (INDEPENDENT_AMBULATORY_CARE_PROVIDER_SITE_OTHER): Payer: Medicare Other | Admitting: *Deleted

## 2021-12-31 DIAGNOSIS — Z0000 Encounter for general adult medical examination without abnormal findings: Secondary | ICD-10-CM | POA: Diagnosis not present

## 2021-12-31 NOTE — Patient Instructions (Signed)
Ms. Tamara Fleming , Thank you for taking time to come for your Medicare Wellness Visit. I appreciate your ongoing commitment to your health goals. Please review the following plan we discussed and let me know if I can assist you in the future.   Screening recommendations/referrals: Colonoscopy: up to date Mammogram: Education provided Bone Density: up to date Recommended yearly ophthalmology/optometry visit for glaucoma screening and checkup Recommended yearly dental visit for hygiene and checkup  Vaccinations: Influenza vaccine: up to date Pneumococcal vaccine: up to date Tdap vaccine: due Shingles vaccine:    Advanced directives: Education provided  Conditions/risks identified:   Next appointment: 02-23-2022 @ 11:20  [facility] 75 Years and Older, Female Preventive care refers to lifestyle choices and visits with your health care provider that can promote health and wellness. What does preventive care include? A yearly physical exam. This is also called an annual well check. Dental exams once or twice a year. Routine eye exams. Ask your health care provider how often you should have your eyes checked. Personal lifestyle choices, including: Daily care of your teeth and gums. Regular physical activity. Eating a healthy diet. Avoiding tobacco and drug use. Limiting alcohol use. Practicing safe sex. Taking low-dose aspirin every day. Taking vitamin and mineral supplements as recommended by your health care provider. What happens during an annual well check? The services and screenings done by your health care provider during your annual well check will depend on your age, overall health, lifestyle risk factors, and family history of disease. Counseling  Your health care provider may ask you questions about your: Alcohol use. Tobacco use. Drug use. Emotional well-being. Home and relationship well-being. Sexual activity. Eating habits. History of falls. Memory  and ability to understand (cognition). Work and work Statistician. Reproductive health. Screening  You may have the following tests or measurements: Height, weight, and BMI. Blood pressure. Lipid and cholesterol levels. These may be checked every 5 years, or more frequently if you are over 39 years old. Skin check. Lung cancer screening. You may have this screening every year starting at age 57 if you have a 30-pack-year history of smoking and currently smoke or have quit within the past 15 years. Fecal occult blood test (FOBT) of the stool. You may have this test every year starting at age 68. Flexible sigmoidoscopy or colonoscopy. You may have a sigmoidoscopy every 5 years or a colonoscopy every 10 years starting at age 54. Hepatitis C blood test. Hepatitis B blood test. Sexually transmitted disease (STD) testing. Diabetes screening. This is done by checking your blood sugar (glucose) after you have not eaten for a while (fasting). You may have this done every 1-3 years. Bone density scan. This is done to screen for osteoporosis. You may have this done starting at age 81. Mammogram. This may be done every 1-2 years. Talk to your health care provider about how often you should have regular mammograms. Talk with your health care provider about your test results, treatment options, and if necessary, the need for more tests. Vaccines  Your health care provider may recommend certain vaccines, such as: Influenza vaccine. This is recommended every year. Tetanus, diphtheria, and acellular pertussis (Tdap, Td) vaccine. You may need a Td booster every 10 years. Zoster vaccine. You may need this after age 32. Pneumococcal 13-valent conjugate (PCV13) vaccine. One dose is recommended after age 13. Pneumococcal polysaccharide (PPSV23) vaccine. One dose is recommended after age 7. Talk to your health care provider about which screenings and  vaccines you need and how often you need them. This  information is not intended to replace advice given to you by your health care provider. Make sure you discuss any questions you have with your health care provider. Document Released: 04/19/2015 Document Revised: 12/11/2015 Document Reviewed: 01/22/2015 Elsevier Interactive Patient Education  2017 [employer] Prevention in the Home Falls can cause injuries. They can happen to people of all ages. There are many things you can do to make your home safe and to help prevent falls. What can I do on the outside of my home? Regularly fix the edges of walkways and driveways and fix any cracks. Remove anything that might make you trip as you walk through a door, such as a raised step or threshold. Trim any bushes or trees on the path to your home. Use bright outdoor lighting. Clear any walking paths of anything that might make someone trip, such as rocks or tools. Regularly check to see if handrails are loose or broken. Make sure that both sides of any steps have handrails. Any raised decks and porches should have guardrails on the edges. Have any leaves, snow, or ice cleared regularly. Use sand or salt on walking paths during winter. Clean up any spills in your garage right away. This includes oil or grease spills. What can I do in the bathroom? Use night lights. Install grab bars by the toilet and in the tub and shower. Do not use towel bars as grab bars. Use non-skid mats or decals in the tub or shower. If you need to sit down in the shower, use a plastic, non-slip stool. Keep the floor dry. Clean up any water that spills on the floor as soon as it happens. Remove soap buildup in the tub or shower regularly. Attach bath mats securely with double-sided non-slip rug tape. Do not have throw rugs and other things on the floor that can make you trip. What can I do in the bedroom? Use night lights. Make sure that you have a light by your bed that is easy to reach. Do not use any sheets or  blankets that are too big for your bed. They should not hang down onto the floor. Have a firm chair that has side arms. You can use this for support while you get dressed. Do not have throw rugs and other things on the floor that can make you trip. What can I do in the kitchen? Clean up any spills right away. Avoid walking on wet floors. Keep items that you use a lot in easy-to-reach places. If you need to reach something above you, use a strong step stool that has a grab bar. Keep electrical cords out of the way. Do not use floor polish or wax that makes floors slippery. If you must use wax, use non-skid floor wax. Do not have throw rugs and other things on the floor that can make you trip. What can I do with my stairs? Do not leave any items on the stairs. Make sure that there are handrails on both sides of the stairs and use them. Fix handrails that are broken or loose. Make sure that handrails are as long as the stairways. Check any carpeting to make sure that it is firmly attached to the stairs. Fix any carpet that is loose or worn. Avoid having throw rugs at the top or bottom of the stairs. If you do have throw rugs, attach them to the floor with carpet tape. Make  sure that you have a light switch at the top of the stairs and the bottom of the stairs. If you do not have them, ask someone to add them for you. What else can I do to help prevent falls? Wear shoes that: Do not have high heels. Have rubber bottoms. Are comfortable and fit you well. Are closed at the toe. Do not wear sandals. If you use a stepladder: Make sure that it is fully opened. Do not climb a closed stepladder. Make sure that both sides of the stepladder are locked into place. Ask someone to hold it for you, if possible. Clearly mark and make sure that you can see: Any grab bars or handrails. First and last steps. Where the edge of each step is. Use tools that help you move around (mobility aids) if they are  needed. These include: Canes. Walkers. Scooters. Crutches. Turn on the lights when you go into a dark area. Replace any light bulbs as soon as they burn out. Set up your furniture so you have a clear path. Avoid moving your furniture around. If any of your floors are uneven, fix them. If there are any pets around you, be aware of where they are. Review your medicines with your doctor. Some medicines can make you feel dizzy. This can increase your chance of falling. Ask your doctor what other things that you can do to help prevent falls. This information is not intended to replace advice given to you by your health care provider. Make sure you discuss any questions you have with your health care provider. Document Released: 01/17/2009 Document Revised: 08/29/2015 Document Reviewed: 04/27/2014 Elsevier Interactive Patient Education  2017 [employer].

## 2021-12-31 NOTE — Progress Notes (Signed)
Subjective:   Tamara Fleming is a 76 y.o. female who presents for Medicare Annual (Subsequent) preventive examination.  I connected with  Tamara Fleming on 12/31/21 by ate telephone enabled telemedicine application and verified that I am speaking with the correct person using two identifiers.   I discussed the limitations of evaluation and management by telemedicine. The patient expressed understanding and agreed to proceed.  Patient location: home  Provider location:  Tele-health-home    Review of Systems           Objective:    There were no vitals filed for this visit. There is no height or weight on file to calculate BMI.     10/03/2021    7:29 AM 12/23/2020    5:09 PM 06/01/2019    2:42 PM 01/08/2018   11:20 AM 01/08/2018   11:03 AM 11/17/2017    3:24 PM 06/07/2017    4:30 PM  Advanced Directives  Does Patient Have a Medical Advance Directive? No No No  No No No  Would patient like information on creating a medical advance directive? No - Patient declined No - Patient declined  No - Patient declined  Yes (MAU/Ambulatory/Procedural Areas - Information given) No - Patient declined    Current Medications (verified) Outpatient Encounter Medications as of 12/31/2021  Medication Sig   ALPRAZolam (XANAX) 0.5 MG tablet Take 30 minutes prior to flight   aspirin EC 81 MG tablet Take 81 mg by mouth daily.   atorvastatin (LIPITOR) 10 MG tablet Take 1 tablet (10 mg total) by mouth at bedtime.   benzonatate (TESSALON) 200 MG capsule Take 1 capsule (200 mg total) by mouth 2 (two) times daily as needed for cough.   budesonide-formoterol (SYMBICORT) 160-4.5 MCG/ACT inhaler Inhale 2 puffs into the lungs 2 (two) times daily. (Patient not taking: Reported on 08/21/2021)   clobetasol (TEMOVATE) 0.05 % external solution Apply 1 application topically 2 (two) times daily. Apply to affected areas on scalp daily for 2 weeks then decrease daily 5 times a week   Dapsone 7.5 % GEL Apply to legs  once a day   furosemide (LASIX) 20 MG tablet Take 1 tablet (20 mg total) by mouth daily. PRN for severe swelling   HYDROcodone-acetaminophen (NORCO/VICODIN) 5-325 MG tablet Take 1 tablet by mouth 3 (three) times daily as needed for moderate pain.   indomethacin (INDOCIN) 50 MG capsule Take 1 capsule (50 mg total) by mouth 3 (three) times daily with meals. For gout flare   losartan (COZAAR) 50 MG tablet TAKE 1 TABLET BY MOUTH ONCE DAILY   omeprazole (PRILOSEC OTC) 20 MG tablet Take 1 tablet (20 mg total) by mouth daily.   predniSONE (DELTASONE) 10 MG tablet 6 tabs day 1 and 2, 5 tabs days 3 and 4, decrease by 1 every other day until gone.   spironolactone (ALDACTONE) 25 MG tablet Take 1 tablet (25 mg total) by mouth daily.   No facility-administered encounter medications on file as of 12/31/2021.    Allergies (verified) Ciprofloxacin, Floxin [ofloxacin], and Sulfa antibiotics   History: Past Medical History:  Diagnosis Date   Arthritis of left hip 02/17/2017   Basal cell carcinoma 10/30/2019   L chin. Superficial.   Carpal tunnel syndrome 05/09/2014   Right   Chest pain    Chest pain with high risk for cardiac etiology 10/20/2010   Chronic low back pain    Complication of anesthesia    small mouth   Diverticulitis 02/17/2017  Fatigue    GERD (gastroesophageal reflux disease)    occ   Gout    Heart murmur    Hiatal hernia    Hip pain    Left   Hyperlipidemia    Hypertension    Low back pain (secondary) bilateral (right greater than left) 09/30/2016   OA (osteoarthritis)    Peripheral vascular disease ([place])    plhebitis 20 yrs ago   Piriformis syndrome of right side 02/17/2017   Primary osteoarthritis of first carpometacarpal joint of left hand 09/10/2016   Primary osteoarthritis of left hip 03/23/2017   Past Surgical History:  Procedure Laterality Date   ABDOMINAL HYSTERECTOMY  1982   Partial   APPENDECTOMY     ARM SURGERY Left    OF THE ULNA NERVE   BREAST EXCISIONAL  BIOPSY Right    CARDIOVASCULAR STRESS TEST  08/11/2006   NORMAL. EF 55-60%   CARDIOVASCULAR STRESS TEST  10/31/1998   EF 60% AT REST TO 75-80% POST EXERCISE   CARPAL TUNNEL RELEASE Right 2015   ESOPHAGOGASTRODUODENOSCOPY  05/16/1990   ESOPHAGOGASTRODUODENOSCOPY (EGD) WITH PROPOFOL N/A 02/04/2015   Procedure: ESOPHAGOGASTRODUODENOSCOPY (EGD) WITH PROPOFOL;  Surgeon: Hulen Luster, MD;  Location: [facility] ENDOSCOPY;  Service: Gastroenterology;  Laterality: N/A;   JOINT REPLACEMENT     REDUCTION MAMMAPLASTY  06/11/15   TOTAL HIP ARTHROPLASTY Left 04/13/2017   Procedure: TOTAL LEFT HIP ARTHROPLASTY ANTERIOR APPROACH;  Surgeon: Renette Butters, MD;  Location: [facility];  Service: Orthopedics;  Laterality: Left;   Family History  Problem Relation Age of Onset   Coronary artery disease Mother    Cancer Father        Lung   Social History   Socioeconomic History   Marital status: Married    Spouse name: Not on file   Number of children: Not on file   Years of education: 35, 1 year college    Highest education level: Some college, no degree  Occupational History   Occupation: self employed   Tobacco Use   Smoking status: Never   Smokeless tobacco: Never  Vaping Use   Vaping Use: Never used  Substance and Sexual Activity   Alcohol use: Yes    Alcohol/week: 21.0 standard drinks of alcohol    Types: 21 Glasses of wine per week   Drug use: Yes    Comment: prescribed hydrocodone   Sexual activity: Not Currently    Birth control/protection: None  Other Topics Concern   Not on file  Social History Narrative   Self employed   Social Determinants of Health   Financial Resource Strain: Low Risk  (12/23/2020)   Overall Financial Resource Strain (CARDIA)    Difficulty of Paying Living Expenses: Not hard at all  Food Insecurity: No Food Insecurity (12/23/2020)   Hunger Vital Sign    Worried About Running Out of Food in the Last Year: Never true    Ran Out of Food in the Last Year: Never true   Transportation Needs: No Transportation Needs (12/23/2020)   PRAPARE - Hydrologist (Medical): No    Lack of Transportation (Non-Medical): No  Physical Activity: Insufficiently Active (12/23/2020)   Exercise Vital Sign    Days of Exercise per Week: 2 days    Minutes of Exercise per Session: 20 min  Stress: No Stress Concern Present (12/23/2020)   [place]    Feeling of Stress : Not at all  Social  Connections: Moderately Integrated (12/23/2020)   Social Connection and Isolation Panel [NHANES]    Frequency of Communication with Friends and Family: More than three times a week    Frequency of Social Gatherings with Friends and Family: More than three times a week    Attends Religious Services: More than 4 times per year    Active Member of Clubs or Organizations: No    Attends Archivist Meetings: Never    Marital Status: Married    Tobacco Counseling Counseling given: Not Answered   Clinical Intake:                 Diabetic?  no         Activities of Daily Living     No data to display          Patient Care Team: Valerie Roys, DO as PCP - General (Family Medicine) Almedia Balls, MD as Consulting Physician (Orthopedic Surgery) Isaias Cowman, MD as Consulting Physician (Cardiology)  Indicate any recent [place] you may have received from other than [facility] providers in the past year (date may be approximate).     Assessment:   This is a routine wellness examination for [place].  Hearing/Vision screen No results found.  Dietary issues and exercise activities discussed:     Goals Addressed   None    Depression Screen    02/21/2021   11:11 AM 12/23/2020    5:06 PM 07/01/2020   10:19 AM 12/28/2019    1:57 PM 06/01/2019    2:36 PM 12/20/2017   12:49 PM 11/17/2017    3:18 PM  PHQ 2/9 Scores  PHQ - 2 Score 0 0 0 0 0 0 0  PHQ- 9  Score '1   2  4 2    '$ Fall Risk    12/23/2020    5:10 PM 07/01/2020   10:19 AM 06/01/2019    2:34 PM 12/20/2017   12:49 PM 11/17/2017    3:18 PM  Fall Risk   Falls in the past year? 0 1 0 No Yes  Number falls in past yr: 0 0 0  2 or more  Injury with Fall? 0 0 0  Yes  Risk Factor Category      High Fall Risk  Risk for fall due to : No Fall Risks History of fall(s)     Follow up Falls evaluation completed    Falls prevention discussed;Falls evaluation completed    FALL RISK PREVENTION PERTAINING TO THE HOME:  Any stairs in or around the home? Yes  If so, are there any without handrails? No  Home free of loose throw rugs in walkways, pet beds, electrical cords, etc? Yes  Adequate lighting in your home to reduce risk of falls? Yes   ASSISTIVE DEVICES UTILIZED TO PREVENT FALLS:  Life alert? No  Use of a cane, walker or w/c? No  Grab bars in the bathroom? No  Shower chair or bench in shower? Yes  Elevated toilet seat or a handicapped toilet? Yes   TIMED UP AND GO:  Was the test performed? No .    Cognitive Function:        12/23/2020    5:12 PM 11/17/2017    3:24 PM  6CIT Screen  What Year? 0 points 0 points  What month? 0 points 0 points  What time? 0 points 0 points  Count back from 20 0 points 0 points  Months in reverse 0 points 0 points  Repeat phrase 0 points 0 points  Total Score 0 points 0 points    Immunizations Immunization History  Administered Date(s) Administered   Fluad Quad(high Dose 65+) 12/20/2018, 01/29/2020, 01/20/2021   Influenza, High Dose Seasonal PF 01/21/2016, 01/20/2017, 03/15/2018   Influenza-Unspecified 02/27/2015   PFIZER(Purple Top)SARS-COV-2 Vaccination 05/19/2019, 06/13/2019   Pneumococcal Conjugate-13 11/17/2013   Pneumococcal Polysaccharide-23 05/16/2014    TDAP status: Due, Education has been provided regarding the importance of this vaccine. Advised may receive this vaccine at local pharmacy or Health Dept. Aware to provide a  copy of the vaccination record if obtained from local pharmacy or Health Dept. Verbalized acceptance and understanding.  Flu Vaccine status: Up to date  Pneumococcal vaccine status: Up to date  Covid-19 vaccine status: Information provided on how to obtain vaccines.   Qualifies for Shingles Vaccine? Yes   Zostavax completed No   Shingrix Completed?: No.    Education has been provided regarding the importance of this vaccine. Patient has been advised to call insurance company to determine out of pocket expense if they have not yet received this vaccine. Advised may also receive vaccine at local pharmacy or Health Dept. Verbalized acceptance and understanding.  Screening Tests Health Maintenance  Topic Date Due   Zoster Vaccines- Shingrix (1 of 2) Never done   COVID-19 Vaccine (3 - Pfizer risk series) 07/11/2019   MAMMOGRAM  08/02/2019   INFLUENZA VACCINE  11/04/2021   TETANUS/TDAP  02/21/2022 (Originally 05/29/1964)   COLONOSCOPY (Pts 45-33yr Insurance coverage will need to be confirmed)  04/15/2023   Pneumonia Vaccine 75 Years old  Completed   DEXA SCAN  Addressed   Hepatitis C Screening  Addressed   HPV VACCINES  Aged Out    Health Maintenance  Health Maintenance Due  Topic Date Due   Zoster Vaccines- Shingrix (1 of 2) Never done   COVID-19 Vaccine (3 - Pfizer risk series) 07/11/2019   MAMMOGRAM  08/02/2019   INFLUENZA VACCINE  11/04/2021    Colorectal cancer screening: Type of screening: Colonoscopy. Completed 2015. Repeat every 10 years  Mammogram status: Ordered  . Pt provided with contact info and advised to call to schedule appt.   Bone Density status: Completed 2011. Results reflect: Bone density results: NORMAL. Repeat every 0 years.  Lung Cancer Screening: (Low Dose CT Chest recommended if Age 75-80years, 30 pack-year currently smoking OR have quit w/in 15years.) does not qualify.   Lung Cancer Screening Referral:   Additional Screening:  Hepatitis C  Screening: does qualify;  Vision Screening: Recommended annual ophthalmology exams for early detection of glaucoma and other disorders of the eye. Is the patient up to date with their annual eye exam?  Yes  Who is the provider or what is the name of the office in which the patient attends annual eye exams? [place] is not established with a provider, would they like to be referred to a provider to establish care? No .   Dental Screening: Recommended annual dental exams for proper oral hygiene  Community Resource Referral / Chronic Care Management: CRR required this visit?  No   CCM required this visit?  No      Plan:     I have personally reviewed and noted the following in the patient's chart:   Medical and social history Use of alcohol, tobacco or illicit drugs  Current medications and supplements including opioid prescriptions. Patient is currently taking opioid prescriptions. Information provided to patient regarding non-opioid alternatives. Patient advised to  discuss non-opioid treatment plan with their provider. Functional ability and status Nutritional status Physical activity Advanced directives List of other physicians Hospitalizations, surgeries, and ER visits in previous 12 months Vitals Screenings to include cognitive, depression, and falls Referrals and appointments  In addition, I have reviewed and discussed with patient certain preventive protocols, quality metrics, and best practice recommendations. A written personalized care plan for preventive services as well as general preventive health recommendations were provided to patient.     Leroy Kennedy, LPN   [id number]   Nurse Notes:

## 2022-01-09 ENCOUNTER — Other Ambulatory Visit: Payer: Self-pay | Admitting: Family Medicine

## 2022-01-09 NOTE — Telephone Encounter (Signed)
Requested Prescriptions  Pending Prescriptions Disp Refills  . atorvastatin (LIPITOR) 10 MG tablet [Pharmacy Med Name: ATORVASTATIN CALCIUM 10 MG TAB] 90 tablet 2    Sig: TAKE 1 TABLET BY MOUTH AT BEDTIME     Cardiovascular:  Antilipid - Statins Failed - 01/09/2022 11:59 AM      Failed - Lipid Panel in normal range within the last 12 months    Cholesterol, Total  Date Value Ref Range Status  08/21/2021 174 100 - 199 mg/dL Final   Cholesterol  Date Value Ref Range Status  07/15/2012 109 0 - 200 mg/dL Final   Cholesterol Piccolo, Waived  Date Value Ref Range Status  05/15/2015 189 <200 mg/dL Final    Comment:                            Desirable                <200                         Borderline High      200- 239                         High                     >239    Ldl Cholesterol, Calc  Date Value Ref Range Status  07/15/2012 51 0 - 100 mg/dL Final   LDL Chol Calc (NIH)  Date Value Ref Range Status  08/21/2021 95 0 - 99 mg/dL Final   Direct LDL  Date Value Ref Range Status  04/22/2011 125.0 mg/dL Final    Comment:    Optimal:  <100 mg/dLNear or Above Optimal:  100-129 mg/dLBorderline High:  130-159 mg/dLHigh:  160-189 mg/dLVery High:  >190 mg/dL   HDL Cholesterol  Date Value Ref Range Status  07/15/2012 35 (L) 40 - 60 mg/dL Final   HDL  Date Value Ref Range Status  08/21/2021 60 >39 mg/dL Final   Triglycerides  Date Value Ref Range Status  08/21/2021 103 0 - 149 mg/dL Final  07/15/2012 116 0 - 200 mg/dL Final   Triglycerides Piccolo,Waived  Date Value Ref Range Status  05/15/2015 157 (H) <150 mg/dL Final    Comment:                            Normal                   <150                         Borderline High     150 - 199                         High                200 - 499                         Very High                >499          Passed - Patient is not pregnant      Passed - Valid encounter within  last 12 months    Recent Outpatient  Visits          4 months ago [place], Megan P, DO   4 months ago Upper respiratory tract infection, unspecified type   [place], [place], DO   6 months ago Generalized abdominal pain   [place], [place] T, NP   7 months ago Generalized abdominal pain   [place], Henrine Screws T, NP   10 months ago Essential hypertension   [place], Barb Merino, DO      Future Appointments            In 1 month Wynetta Emery, Barb Merino, DO [employer], [place]   In 1 month Ralene Bathe, MD [place]

## 2022-01-20 ENCOUNTER — Ambulatory Visit (INDEPENDENT_AMBULATORY_CARE_PROVIDER_SITE_OTHER): Payer: Medicare Other

## 2022-01-20 DIAGNOSIS — M791 Myalgia, unspecified site: Secondary | ICD-10-CM | POA: Diagnosis not present

## 2022-01-20 DIAGNOSIS — Z79891 Long term (current) use of opiate analgesic: Secondary | ICD-10-CM | POA: Diagnosis not present

## 2022-01-20 DIAGNOSIS — M47814 Spondylosis without myelopathy or radiculopathy, thoracic region: Secondary | ICD-10-CM | POA: Diagnosis not present

## 2022-01-20 DIAGNOSIS — Z23 Encounter for immunization: Secondary | ICD-10-CM

## 2022-01-20 DIAGNOSIS — M546 Pain in thoracic spine: Secondary | ICD-10-CM | POA: Diagnosis not present

## 2022-01-20 NOTE — Progress Notes (Signed)
Patient presents today for Flu vaccination, patient received in left  deltoid, patient tolerated well.   

## 2022-02-10 ENCOUNTER — Ambulatory Visit
Admission: RE | Admit: 2022-02-10 | Discharge: 2022-02-10 | Disposition: A | Discharge status: Home / Self Care | Payer: Medicare Other | Attending: Unknown Physician Specialty | Admitting: Unknown Physician Specialty

## 2022-02-10 ENCOUNTER — Ambulatory Visit (INDEPENDENT_AMBULATORY_CARE_PROVIDER_SITE_OTHER): Payer: Medicare Other | Admitting: Unknown Physician Specialty

## 2022-02-10 ENCOUNTER — Encounter: Payer: Self-pay | Admitting: Unknown Physician Specialty

## 2022-02-10 ENCOUNTER — Ambulatory Visit
Admission: RE | Admit: 2022-02-10 | Discharge: 2022-02-10 | Disposition: A | Discharge status: Home / Self Care | Payer: Medicare Other | Source: Ambulatory Visit | Attending: Unknown Physician Specialty | Admitting: Unknown Physician Specialty

## 2022-02-10 VITALS — BP 149/74 | HR 75 | Temp 98.5°F | Wt 184.0 lb

## 2022-02-10 DIAGNOSIS — R051 Acute cough: Secondary | ICD-10-CM | POA: Diagnosis not present

## 2022-02-10 DIAGNOSIS — J069 Acute upper respiratory infection, unspecified: Secondary | ICD-10-CM | POA: Diagnosis not present

## 2022-02-10 DIAGNOSIS — R059 Cough, unspecified: Secondary | ICD-10-CM | POA: Diagnosis not present

## 2022-02-10 LAB — VERITOR FLU A/B WAIVED
Influenza A: NEGATIVE
Influenza B: NEGATIVE

## 2022-02-10 NOTE — Patient Instructions (Addendum)
Vitamin C 3 times a day Zinc 2 times day Elderberry Quercetin Xlear nasal spray

## 2022-02-10 NOTE — Progress Notes (Signed)
BP (!) 149/74   Pulse 75   Temp 98.5 F (36.9 C) (Oral)   Wt 184 lb (83.5 kg)   LMP  (LMP Unknown)   SpO2 97%   BMI 30.62 kg/m    Subjective:    Patient ID: Tamara Fleming, female    DOB: [birth year], 76 y.o.   MRN: [id number]  HPI: Tamara Fleming is a 76 y.o. female  Chief Complaint  Patient presents with   URI    Pt states she started having congestion, runny nose, cough, and scratchy throat yesterday morning.    URI  This is a new problem. The current episode started yesterday. The problem has been rapidly worsening. The maximum temperature recorded prior to her arrival was 100.4 - 100.9 F (Almost 100). Associated symptoms include coughing, rhinorrhea and a sore throat. Pertinent negatives include no diarrhea, ear pain, headaches, joint pain, joint swelling, nausea, neck pain, sinus pain, sneezing, swollen glands, vomiting or wheezing.     Relevant past medical, surgical, family and social history reviewed and updated as indicated. Interim medical history since our last visit reviewed. Allergies and medications reviewed and updated.  Review of Systems  HENT:  Positive for rhinorrhea and sore throat. Negative for ear pain, sinus pain and sneezing.   Respiratory:  Positive for cough. Negative for wheezing.   Gastrointestinal:  Negative for diarrhea, nausea and vomiting.  Musculoskeletal:  Negative for joint pain and neck pain.  Neurological:  Negative for headaches.    Per HPI unless specifically indicated above     Objective:    BP (!) 149/74   Pulse 75   Temp 98.5 F (36.9 C) (Oral)   Wt 184 lb (83.5 kg)   LMP  (LMP Unknown)   SpO2 97%   BMI 30.62 kg/m   Wt Readings from Last 3 Encounters:  02/10/22 184 lb (83.5 kg)  10/03/21 180 lb (81.6 kg)  08/25/21 188 lb (85.3 kg)    Physical Exam Constitutional:      General: She is not in acute distress.    Appearance: Normal appearance. She is well-developed.  HENT:     Head: Normocephalic and atraumatic.      Right Ear: Tympanic membrane and ear canal normal.     Left Ear: Tympanic membrane and ear canal normal.     Nose: Rhinorrhea present.     Right Sinus: No maxillary sinus tenderness or frontal sinus tenderness.     Left Sinus: No maxillary sinus tenderness or frontal sinus tenderness.     Mouth/Throat:     Pharynx: Posterior oropharyngeal erythema present.  Eyes:     General: Lids are normal. No scleral icterus.       Right eye: No discharge.        Left eye: No discharge.     Conjunctiva/sclera: Conjunctivae normal.  Cardiovascular:     Rate and Rhythm: Normal rate and regular rhythm.  Pulmonary:     Effort: Pulmonary effort is normal. No respiratory distress.     Breath sounds: Normal breath sounds.  Abdominal:     Palpations: There is no hepatomegaly or splenomegaly.  Musculoskeletal:        General: Normal range of motion.     Cervical back: Normal range of motion and neck supple.  Skin:    General: Skin is warm and dry.     Coloration: Skin is not pale.     Findings: No rash.  Neurological:     General: No focal  deficit present.     Mental Status: She is alert and oriented to person, place, and time.  Psychiatric:        Behavior: Behavior normal.        Thought Content: Thought content normal.        Judgment: Judgment normal.     Results for orders placed or performed in visit on 02/10/22  Veritor Flu A/B Waived  Result Value Ref Range   Influenza A Negative Negative   Influenza B Negative Negative      Assessment & Plan:   Problem List Items Addressed This Visit   None Visit Diagnoses     Viral upper respiratory tract infection    -  Primary   Discussed this is consistent with a viral illness. Recommended saline nasal spray, Vit C, Zinc, Quercetin, elderberry. Discussed when ab may be necessary.   Acute cough       will get x-ray for cough and wheeze   Relevant Orders   Veritor Flu A/B Waived (Completed)   Novel Coronavirus, NAA ([facility])   DG  Chest 2 View        Follow up plan: Return if symptoms worsen or fail to improve.

## 2022-02-12 ENCOUNTER — Ambulatory Visit: Payer: Self-pay | Admitting: *Deleted

## 2022-02-12 LAB — NOVEL CORONAVIRUS, NAA: SARS-CoV-2, NAA: NOT DETECTED

## 2022-02-12 NOTE — Telephone Encounter (Signed)
Results of x-ray not back yet- can we please call over to radiology and see what's going on?

## 2022-02-12 NOTE — Telephone Encounter (Signed)
Summary: Per agent: "neg covid and flu  still feels really bad.   Pt called saying he was in the office earlier this week with symptoms of the covid or flu.  Both were neg.  She is still feeling really bad.  She had a chest xray but has not heard back.  She thinks she needs an antibiotic."        Chief Complaint: Cough Symptoms: Productive cough, red flecks at times, greenish phlegm. Frontal headache, earaches,, ears ringing. Body aches, LGT a few days ago. SOB at times at rest, worse nights, sleeping sitting up "Head clogged" Frequency: Seen in OV 02/10/22  "Feel worse" Pertinent Negatives: Patient denies  Disposition: '[]'$ ED /'[]'$ Urgent Care (no appt availability in office) / '[]'$ Appointment(In office/virtual)/ '[]'$  [facility]/ '[]'$ Home Care/ '[]'$ Refused Recommended Disposition /'[]'$ [facility]/ '[x]'$  Follow-up with PCP Additional Notes: Pt saw Kathrine Haddock 02/10/22, neg covid, flu. Had CXR after OV,could  Not find results. States feels worse. States "Feel too bad to come back in, I think I need antibiotics." Assured pt NT would route to practice for PCPs review and final disposition. Care advise provided, advised ED for worsening symptoms, worsening SOB. Verbalizes understanding. Please advise

## 2022-02-12 NOTE — Progress Notes (Signed)
Covid testing is negative:)

## 2022-02-12 NOTE — Telephone Encounter (Signed)
Per radiology department they are unsure why it has not been read yet but patient did have xray done on 02/10/22. Will check on x-ray tomorrow to see if it's been read.

## 2022-02-12 NOTE — Telephone Encounter (Signed)
Routing to PCP and provider that the patient seen is out of the office.

## 2022-02-12 NOTE — Telephone Encounter (Signed)
Summary: neg covid and flu  still feels really bad.   Pt called saying he was in the office earlier this week with symptoms of the covid or flu.  Both were neg.  She is still feeling really bad.  She had a chest xray but has not heard back.  She thinks she needs an antibiotic.     Reason for Disposition  Coughing up rusty-colored (reddish-brown) sputum  Answer Assessment - Initial Assessment Questions 1. ONSET: "When did the cough begin?"      Seen OV 02/10/22 2. SEVERITY: "How bad is the cough today?"      Bad 3. SPUTUM: "Describe the color of your sputum" (none, dry cough; clear, white, yellow, green)     Greenish yellow, little blood 4. HEMOPTYSIS: "Are you coughing up any blood?" If so ask: "How much?" (flecks, streaks, tablespoons, etc.)     Some since seen 5. DIFFICULTY BREATHING: "Are you having difficulty breathing?" If Yes, ask: "How bad is it?" (e.g., mild, moderate, severe)    - MILD: No SOB at rest, mild SOB with walking, speaks normally in sentences, can lie down, no retractions, pulse < 100.    - MODERATE: SOB at rest, SOB with minimal exertion and prefers to sit, cannot lie down flat, speaks in phrases, mild retractions, audible wheezing, pulse 100-120.    - SEVERE: Very SOB at rest, speaks in single words, struggling to breathe, sitting hunched forward, retractions, pulse > 120      Moderate 6. FEVER: "Do you have a fever?" If Yes, ask: "What is your temperature, how was it measured, and when did it start?"     LGT few days 7. CARDIAC HISTORY: "Do you have any history of heart disease?" (e.g., heart attack, congestive heart failure)      *No Answer* 8. LUNG HISTORY: "Do you have any history of lung disease?"  (e.g., pulmonary embolus, asthma, emphysema)     *No Answer* 9. PE RISK FACTORS: "Do you have a history of blood clots?" (or: recent major surgery, recent prolonged travel, bedridden)     *No Answer* 10. OTHER SYMPTOMS: "Do you have any other symptoms?" (e.g., runny  nose, wheezing, chest pain)       Body aches, earache ringing frontal headache  Protocols used: Cough - Acute Productive-A-AH

## 2022-02-13 ENCOUNTER — Ambulatory Visit: Payer: Self-pay | Admitting: Family Medicine

## 2022-02-13 MED ORDER — PREDNISONE 50 MG PO TABS: 50.0000 mg | ORAL_TABLET | Freq: Every day | ORAL | 0 refills | Status: DC

## 2022-02-13 NOTE — Addendum Note (Signed)
Addended by: Valerie Roys on: 02/13/2022 11:05 AM   Modules accepted: Orders

## 2022-02-13 NOTE — Telephone Encounter (Signed)
  Chief Complaint: Shortness of breath, coughing, can't sleep and a lot of congestion.  Symptoms not getting better.   Talking in phrases.  Saw Kathrine Haddock on [place].  Chest x ray done.    No results and no one got back with her yesterday. Symptoms: shortness of breath, coughing and a lot of congestion.   Talking in phrases. Frequency: This week. Pertinent Negatives: Patient denies getting better Disposition: '[x]'$ ED /'[]'$ Urgent Care (no appt availability in office) / '[]'$ Appointment(In office/virtual)/ '[]'$  [facility]/ '[]'$ Home Care/ '[]'$ Refused Recommended Disposition /'[]'$ [facility]/ '[x]'$  Follow-up with PCP Additional Notes:   I called into the office and spoke with Iris regarding the chest x ray.   It was done on 02/10/2022 but has not been read.   It was not read.    They are investigating what is going on.  Pt asked if there was any way Dr. Wynetta Emery could see her.   I called back into the office and asked Iris if that was possible.   Dr. Durenda Age CMA is working on the x ray situation and is going to check with Dr. Wynetta Emery and see if she is willing to call something in and give pt a call back.   I relayed this information to pt.  Pt. Continued to c/o how bad she felt and what was going on with the chest x ray not being read.   I let her know Dr. Durenda Age CMA was working on that now and is going to call her back.  Pt wanted to know what time the CMA was going to call her back but I let her know I didn't have a time but assured her that the situation was being looked into.   Pt was agreeable to someone calling her back.    "I'm not getting better".   "Every day I've gotten worse and I haven't slept in 3 nights".   I gave her reassurance that she would get a call back from Dr. Durenda Age CMA but in the mean time if her breathing got worse that she should go on to the ED.    "I'm not going to the emergency room".    "I just need something started to help me with this congestion and coughing and my  nose is so stopped up I can't breath laying down".    "Kathrine Haddock did not prescribe me anything".   "I'm not upset with Mal[facility] Mood but I need something started since I'm not getting better and might end up in the emergency room because I can't breath".    "I just need some help".     I gave her reassurance and emphasized the situation was being worked on however if she got worse to please have someone take her to the ED.    She was agreeable to someone calling her back and thanked me very much for my help in getting this resolved. I sent my notes to [facility] high priority.

## 2022-02-13 NOTE — Telephone Encounter (Signed)
Reason for Disposition  [1] MILD difficulty breathing (e.g., minimal/no SOB at rest, SOB with walking, pulse <100) AND [2] NEW-onset or WORSE than normal  Answer Assessment - Initial Assessment Questions 1. RESPIRATORY STATUS: "Describe your breathing?" (e.g., wheezing, shortness of breath, unable to speak, severe coughing)      For last 3 days I'm having difficulty breathing.   I can't lay down.  I have a lot of congestion.   I'm coughing and sneezing. Tues. I had a chest x ray done.   No one can find it.   Dr. Wynetta Emery is supposed to call me in something.   Since Monday I've been on my couch.    I talked with 2 different nurses.    2. ONSET: "When did this breathing problem begin?"      Constant short of breath.     3. PATTERN "Does the difficult breathing come and go, or has it been constant since it started?"      I can only talk in phrases.   I have to stop and take breaths.   My body aches like I have the flu   Covid and flu tests are negative.   No RSV test done.    I saw Kathrine Haddock.    4. SEVERITY: "How bad is your breathing?" (e.g., mild, moderate, severe)    - MILD: No SOB at rest, mild SOB with walking, speaks normally in sentences, can lie down, no retractions, pulse < 100.    - MODERATE: SOB at rest, SOB with minimal exertion and prefers to sit, cannot lie down flat, speaks in phrases, mild retractions, audible wheezing, pulse 100-120.    - SEVERE: Very SOB at rest, speaks in single words, struggling to breathe, sitting hunched forward, retractions, pulse > 120      Sever shortness of breath 5. RECURRENT SYMPTOM: "Have you had difficulty breathing before?" If Yes, ask: "When was the last time?" and "What happened that time?"      No 6. CARDIAC HISTORY: "Do you have any history of heart disease?" (e.g., heart attack, angina, bypass surgery, angioplasty)      Murmur 7. LUNG HISTORY: "Do you have any history of lung disease?"  (e.g., pulmonary embolus, asthma, emphysema)     No 8.  CAUSE: "What do you think is causing the breathing problem?"      I'm full of congestion 9. OTHER SYMPTOMS: "Do you have any other symptoms? (e.g., dizziness, runny nose, cough, chest pain, fever)     Runny nose, nasal congestion bad.   10. O2 SATURATION MONITOR:  "Do you use an oxygen saturation monitor (pulse oximeter) at home?" If Yes, ask: "What is your reading (oxygen level) today?" "What is your usual oxygen saturation reading?" (e.g., 95%)       N/A 11. PREGNANCY: "Is there any chance you are pregnant?" "When was your last menstrual period?"       N/A 12. TRAVEL: "Have you traveled out of the country in the last month?" (e.g., travel history, exposures)       N?A  Protocols used: Breathing Difficulty-A-AH

## 2022-02-13 NOTE — Telephone Encounter (Signed)
Per patient she is feeling worse, she is coughing and congested. Advised patient provider will be sending in steroid and patient agrees. Will notify patient when imaging comes back.

## 2022-02-13 NOTE — Telephone Encounter (Signed)
See other TE.

## 2022-02-17 DIAGNOSIS — M47814 Spondylosis without myelopathy or radiculopathy, thoracic region: Secondary | ICD-10-CM | POA: Diagnosis not present

## 2022-02-17 DIAGNOSIS — M546 Pain in thoracic spine: Secondary | ICD-10-CM | POA: Diagnosis not present

## 2022-02-17 DIAGNOSIS — M791 Myalgia, unspecified site: Secondary | ICD-10-CM | POA: Diagnosis not present

## 2022-02-17 DIAGNOSIS — Z79891 Long term (current) use of opiate analgesic: Secondary | ICD-10-CM | POA: Diagnosis not present

## 2022-02-23 ENCOUNTER — Encounter: Payer: Self-pay | Admitting: Family Medicine

## 2022-02-23 ENCOUNTER — Ambulatory Visit (INDEPENDENT_AMBULATORY_CARE_PROVIDER_SITE_OTHER): Payer: Medicare Other | Admitting: Family Medicine

## 2022-02-23 VITALS — BP 128/74 | HR 68 | Temp 98.6°F | Ht 65.0 in | Wt 189.3 lb

## 2022-02-23 DIAGNOSIS — I700 Atherosclerosis of aorta: Secondary | ICD-10-CM

## 2022-02-23 DIAGNOSIS — R7301 Impaired fasting glucose: Secondary | ICD-10-CM

## 2022-02-23 DIAGNOSIS — E559 Vitamin D deficiency, unspecified: Secondary | ICD-10-CM

## 2022-02-23 DIAGNOSIS — E538 Deficiency of other specified B group vitamins: Secondary | ICD-10-CM

## 2022-02-23 DIAGNOSIS — J0100 Acute maxillary sinusitis, unspecified: Secondary | ICD-10-CM | POA: Diagnosis not present

## 2022-02-23 DIAGNOSIS — I10 Essential (primary) hypertension: Secondary | ICD-10-CM

## 2022-02-23 DIAGNOSIS — R7989 Other specified abnormal findings of blood chemistry: Secondary | ICD-10-CM | POA: Diagnosis not present

## 2022-02-23 DIAGNOSIS — M1A9XX0 Chronic gout, unspecified, without tophus (tophi): Secondary | ICD-10-CM | POA: Diagnosis not present

## 2022-02-23 DIAGNOSIS — E7800 Pure hypercholesterolemia, unspecified: Secondary | ICD-10-CM

## 2022-02-23 LAB — URINALYSIS, ROUTINE W REFLEX MICROSCOPIC
Bilirubin, UA: NEGATIVE
Glucose, UA: NEGATIVE
Ketones, UA: NEGATIVE
Nitrite, UA: NEGATIVE
Protein,UA: NEGATIVE
RBC, UA: NEGATIVE
Specific Gravity, UA: 1.02 (ref 1.005–1.030)
Urobilinogen, Ur: >8 mg/dL — ABNORMAL HIGH (ref 0.2–1.0)
pH, UA: 6.5 (ref 5.0–7.5)

## 2022-02-23 LAB — MICROALBUMIN, URINE WAIVED
Creatinine, Urine Waived: 200 mg/dL (ref 10–300)
Microalb, Ur Waived: 10 mg/L (ref 0–19)
Microalb/Creat Ratio: <30 mg/g (ref <30)

## 2022-02-23 LAB — MICROSCOPIC EXAMINATION
Bacteria, UA: NONE SEEN
RBC, Urine: NONE SEEN /hpf (ref 0–2)

## 2022-02-23 LAB — BAYER DCA HB A1C WAIVED: HB A1C (BAYER DCA - WAIVED): 6.3 % — ABNORMAL HIGH (ref 4.8–5.6)

## 2022-02-23 MED ORDER — SPIRONOLACTONE 25 MG PO TABS: 25.0000 mg | ORAL_TABLET | Freq: Every day | ORAL | 1 refills | Status: DC

## 2022-02-23 MED ORDER — POLYETHYLENE GLYCOL 3350 17 GM/SCOOP PO POWD: 17.0000 g | Freq: Two times a day (BID) | ORAL | 1 refills | Status: DC | PRN

## 2022-02-23 MED ORDER — AMOXICILLIN-POT CLAVULANATE 875-125 MG PO TABS: 1.0000 | ORAL_TABLET | Freq: Two times a day (BID) | ORAL | 0 refills | Status: DC

## 2022-02-23 MED ORDER — INDOMETHACIN 50 MG PO CAPS: 50.0000 mg | ORAL_CAPSULE | Freq: Three times a day (TID) | ORAL | 2 refills | Status: DC

## 2022-02-23 MED ORDER — OMEPRAZOLE MAGNESIUM 20 MG PO TBEC: 20.0000 mg | DELAYED_RELEASE_TABLET | Freq: Every day | ORAL | 1 refills | Status: DC

## 2022-02-23 MED ORDER — LOSARTAN POTASSIUM 50 MG PO TABS: 50.0000 mg | ORAL_TABLET | Freq: Every day | ORAL | 0 refills | Status: DC

## 2022-02-23 MED ORDER — BUDESONIDE-FORMOTEROL FUMARATE 160-4.5 MCG/ACT IN AERO
2.0000 | INHALATION_SPRAY | Freq: Two times a day (BID) | RESPIRATORY_TRACT | 3 refills | Status: DC
Start: 2022-02-23 — End: 2022-06-08

## 2022-02-23 NOTE — Progress Notes (Signed)
BP 128/74   Pulse 68   Temp 98.6 F (37 C) (Oral)   Ht '5\' 5"'$  (1.651 m)   Wt 189 lb 4.8 oz (85.9 kg)   LMP  (LMP Unknown)   SpO2 99%   BMI 31.50 kg/m    Subjective:    Patient ID: Tamara Fleming, female    DOB: 03/05/1946, 76 y.o.   MRN: [id number]  HPI: Tamara Fleming is a 76 y.o. female  Chief Complaint  Patient presents with   Hypertension   URI   HYPERTENSION / [place] Satisfied with current treatment? yes Duration of hypertension: chronic BP monitoring frequency: not checking BP medication side effects: no Past BP meds: losartan, lasix, spironalactone Duration of hyperlipidemia: chronic Cholesterol medication side effects: no Cholesterol supplements: none Past cholesterol medications: atorvastatin Medication compliance: excellent compliance Aspirin: yes Recent stressors: no Recurrent headaches: no Visual changes: no Palpitations: no Dyspnea: yes Chest pain: no Lower extremity edema: yes Dizzy/lightheaded: no  Impaired Fasting Glucose HbA1C:  Lab Results  Component Value Date   HGBA1C 6.3 (H) 02/23/2022   Duration of elevated blood sugar: chronic Polydipsia: no Polyuria: no Weight change: no Visual disturbance: no Glucose Monitoring: no Diabetic Education: Not Completed Family history of diabetes: yes  UPPER RESPIRATORY TRACT INFECTION Duration: 2-3 weeks Worst symptom: cough, laryngitis Fever: no Cough: yes Shortness of breath: yes Wheezing: yes Chest pain: yes, with cough Chest tightness: yes Chest congestion: yes Nasal congestion: yes Runny nose: yes Post nasal drip: yes Sneezing: no Sore throat: no Swollen glands: no Sinus pressure: no Headache: yes Face pain: no Toothache: no Ear pain: no  Ear pressure: no  Eyes red/itching:no Eye drainage/crusting: no  Vomiting: no Rash: no Fatigue: yes Sick contacts: no Strep contacts: no  Context: stable Recurrent sinusitis: no Relief with OTC cold/cough medications: no   Treatments attempted: steroids   Had a gout flare about 2 weeks ago. Feeling better now.   Relevant past medical, surgical, family and social history reviewed and updated as indicated. Interim medical history since our last visit reviewed. Allergies and medications reviewed and updated.  Review of Systems  Constitutional: Negative.   HENT:  Positive for postnasal drip, rhinorrhea and sinus pressure. Negative for congestion, dental problem, drooling, ear discharge, ear pain, facial swelling, hearing loss, mouth sores, nosebleeds, sinus pain, sneezing, sore throat, tinnitus, trouble swallowing and voice change.   Respiratory:  Positive for cough, chest tightness, shortness of breath and wheezing. Negative for apnea, choking and stridor.   Cardiovascular: Negative.   Gastrointestinal:  Positive for constipation. Negative for abdominal distention, abdominal pain, anal bleeding, blood in stool, diarrhea, nausea, rectal pain and vomiting.  Psychiatric/Behavioral: Negative.      Per HPI unless specifically indicated above     Objective:    BP 128/74   Pulse 68   Temp 98.6 F (37 C) (Oral)   Ht '5\' 5"'$  (1.651 m)   Wt 189 lb 4.8 oz (85.9 kg)   LMP  (LMP Unknown)   SpO2 99%   BMI 31.50 kg/m   Wt Readings from Last 3 Encounters:  02/23/22 189 lb 4.8 oz (85.9 kg)  02/10/22 184 lb (83.5 kg)  10/03/21 180 lb (81.6 kg)    Physical Exam Vitals and nursing note reviewed.  Constitutional:      General: She is not in acute distress.    Appearance: Normal appearance. She is not ill-appearing, toxic-appearing or diaphoretic.  HENT:     Head: Normocephalic and atraumatic.  Right Ear: External ear normal. There is impacted cerumen.     Left Ear: External ear normal. There is impacted cerumen.     Nose: Rhinorrhea present. No congestion.     Mouth/Throat:     Mouth: Mucous membranes are moist.     Pharynx: Oropharynx is clear. No oropharyngeal exudate or posterior oropharyngeal erythema.   Eyes:     General: No scleral icterus.       Right eye: No discharge.        Left eye: No discharge.     Extraocular Movements: Extraocular movements intact.     Conjunctiva/sclera: Conjunctivae normal.     Pupils: Pupils are equal, round, and reactive to light.  Cardiovascular:     Rate and Rhythm: Normal rate and regular rhythm.     Pulses: Normal pulses.     Heart sounds: Normal heart sounds. No murmur heard.    No friction rub. No gallop.  Pulmonary:     Effort: Pulmonary effort is normal. No respiratory distress.     Breath sounds: Normal breath sounds. No stridor. No wheezing, rhonchi or rales.  Chest:     Chest wall: No tenderness.  Musculoskeletal:        General: Normal range of motion.     Cervical back: Normal range of motion and neck supple.  Skin:    General: Skin is warm and dry.     Capillary Refill: Capillary refill takes less than 2 seconds.     Coloration: Skin is not jaundiced or pale.     Findings: No bruising, erythema, lesion or rash.  Neurological:     General: No focal deficit present.     Mental Status: She is alert and oriented to person, place, and time. Mental status is at baseline.  Psychiatric:        Mood and Affect: Mood normal.        Behavior: Behavior normal.        Thought Content: Thought content normal.        Judgment: Judgment normal.     Results for orders placed or performed in visit on 02/23/22  Microscopic Examination   Urine  Result Value Ref Range   WBC, UA 0-5 0 - 5 /hpf   RBC, Urine None seen 0 - 2 /hpf   Epithelial Cells (non renal) 0-10 0 - 10 /hpf   Bacteria, UA None seen None seen/Few  Urinalysis, Routine w reflex microscopic  Result Value Ref Range   Specific Gravity, UA 1.020 1.005 - 1.030   pH, UA 6.5 5.0 - 7.5   Color, UA Yellow Yellow   Appearance Ur Clear Clear   Leukocytes,UA 1+ (A) Negative   Protein,UA Negative Negative/Trace   Glucose, UA Negative Negative   Ketones, UA Negative Negative   RBC, UA  Negative Negative   Bilirubin, UA Negative Negative   Urobilinogen, Ur >8.0 (H) 0.2 - 1.0 mg/dL   Nitrite, UA Negative Negative   Microscopic Examination See below:   Bayer DCA Hb A1c Waived  Result Value Ref Range   HB A1C (BAYER DCA - WAIVED) 6.3 (H) 4.8 - 5.6 %  Microalbumin, Urine Waived  Result Value Ref Range   Microalb, Ur Waived 10 0 - 19 mg/L   Creatinine, Urine Waived 200 10 - 300 mg/dL   Microalb/Creat Ratio <30 <30 mg/g      Assessment & Plan:   Problem List Items Addressed This Visit       Cardiovascular and  Mediastinum   Essential hypertension    Under good control on current regimen. Continue current regimen. Continue to monitor. Call with any concerns. Refills given. Labs drawn today.        Relevant Medications   losartan (COZAAR) 50 MG tablet   spironolactone (ALDACTONE) 25 MG tablet   Other Relevant Orders   CBC with Differential/Platelet   Comprehensive metabolic panel   Urinalysis, Routine w reflex microscopic (Completed)   TSH   Microalbumin, Urine Waived (Completed)   Aortic atherosclerosis (HCC)    Will keep BP and cholesterol under good control. Continue current regimen. Continue to monitor. Call with any concerns.       Relevant Medications   losartan (COZAAR) 50 MG tablet   spironolactone (ALDACTONE) 25 MG tablet   Other Relevant Orders   CBC with Differential/Platelet   Comprehensive metabolic panel     Endocrine   IFG (impaired fasting glucose)    A1c elevated at 6.3. Continue diet and exercise. Continue to monitor.       Relevant Orders   CBC with Differential/Platelet   Comprehensive metabolic panel   Urinalysis, Routine w reflex microscopic (Completed)   Bayer DCA Hb A1c Waived (Completed)     Other   Pure hypercholesterolemia    Under good control on current regimen. Continue current regimen. Continue to monitor. Call with any concerns. Refills given. Labs drawn today.        Relevant Medications   losartan (COZAAR) 50  MG tablet   spironolactone (ALDACTONE) 25 MG tablet   Other Relevant Orders   CBC with Differential/Platelet   Comprehensive metabolic panel   Lipid Panel w/o Chol/HDL Ratio   B12 deficiency    Rechecking labs today. Await results. Treat as needed.       Relevant Orders   CBC with Differential/Platelet   Comprehensive metabolic panel   Q68   Vitamin D deficiency    Rechecking labs today. Await results. Treat as needed.       Relevant Orders   CBC with Differential/Platelet   Comprehensive metabolic panel   VITAMIN D 25 Hydroxy (Vit-D Deficiency, Fractures)   Elevated TSH    Rechecking labs today. Await results. Treat as needed.       Relevant Orders   CBC with Differential/Platelet   Comprehensive metabolic panel   TSH   Gout    Rechecking labs today. Await results. Treat as needed. May need allopurinol.      Relevant Orders   Uric acid   Other Visit Diagnoses     Acute non-recurrent maxillary sinusitis    -  Primary   Will treat with augmentin. Call with any concerns or if not getting better. Recheck 2 weeks.   Relevant Medications   amoxicillin-clavulanate (AUGMENTIN) 875-125 MG tablet        Follow up plan: Return in about 2 weeks (around 03/09/2022).

## 2022-02-23 NOTE — Assessment & Plan Note (Signed)
Under good control on current regimen. Continue current regimen. Continue to monitor. Call with any concerns. Refills given. Labs drawn today.   

## 2022-02-23 NOTE — Assessment & Plan Note (Signed)
Rechecking labs today. Await results. Treat as needed.  °

## 2022-02-23 NOTE — Assessment & Plan Note (Signed)
Rechecking labs today. Await results. Treat as needed. May need allopurinol.

## 2022-02-23 NOTE — Assessment & Plan Note (Signed)
Will keep BP and cholesterol under good control. Continue current regimen. Continue to monitor. Call with any concerns.

## 2022-02-23 NOTE — Assessment & Plan Note (Signed)
A1c elevated at 6.3. Continue diet and exercise. Continue to monitor.

## 2022-02-24 LAB — CBC WITH DIFFERENTIAL/PLATELET
Basophils Absolute: 0 10*3/uL (ref 0.0–0.2)
Basos: 0 %
EOS (ABSOLUTE): 0.1 10*3/uL (ref 0.0–0.4)
Eos: 3 %
Hematocrit: 36.8 % (ref 34.0–46.6)
Hemoglobin: 12.5 g/dL (ref 11.1–15.9)
Immature Grans (Abs): 0 10*3/uL (ref 0.0–0.1)
Immature Granulocytes: 0 %
Lymphocytes Absolute: 1.6 10*3/uL (ref 0.7–3.1)
Lymphs: 32 %
MCH: 33.7 pg — ABNORMAL HIGH (ref 26.6–33.0)
MCHC: 34 g/dL (ref 31.5–35.7)
MCV: 99 fL — ABNORMAL HIGH (ref 79–97)
Monocytes Absolute: 0.7 10*3/uL (ref 0.1–0.9)
Monocytes: 13 %
Neutrophils Absolute: 2.5 10*3/uL (ref 1.4–7.0)
Neutrophils: 52 %
Platelets: 162 10*3/uL (ref 150–450)
RBC: 3.71 x10E6/uL — ABNORMAL LOW (ref 3.77–5.28)
RDW: 12.3 % (ref 11.7–15.4)
WBC: 4.9 10*3/uL (ref 3.4–10.8)

## 2022-02-24 LAB — COMPREHENSIVE METABOLIC PANEL
ALT: 26 IU/L (ref 0–32)
AST: 22 IU/L (ref 0–40)
Albumin/Globulin Ratio: 1.8 (ref 1.2–2.2)
Albumin: 4.1 g/dL (ref 3.8–4.8)
Alkaline Phosphatase: 104 IU/L (ref 44–121)
BUN/Creatinine Ratio: 10 — ABNORMAL LOW (ref 12–28)
BUN: 10 mg/dL (ref 8–27)
Bilirubin Total: 0.7 mg/dL (ref 0.0–1.2)
CO2: 23 mmol/L (ref 20–29)
Calcium: 9.3 mg/dL (ref 8.7–10.3)
Chloride: 103 mmol/L (ref 96–106)
Creatinine, Ser: 0.96 mg/dL (ref 0.57–1.00)
Globulin, Total: 2.3 g/dL (ref 1.5–4.5)
Glucose: 106 mg/dL — ABNORMAL HIGH (ref 70–99)
Potassium: 3.9 mmol/L (ref 3.5–5.2)
Sodium: 141 mmol/L (ref 134–144)
Total Protein: 6.4 g/dL (ref 6.0–8.5)
eGFR: 62 mL/min/{1.73_m2} (ref >59)

## 2022-02-24 LAB — VITAMIN B12: Vitamin B-12: 413 pg/mL (ref 232–1245)

## 2022-02-24 LAB — TSH: TSH: 3.57 u[IU]/mL (ref 0.450–4.500)

## 2022-02-24 LAB — URIC ACID: Uric Acid: 5.8 mg/dL (ref 3.1–7.9)

## 2022-02-24 LAB — VITAMIN D 25 HYDROXY (VIT D DEFICIENCY, FRACTURES): Vit D, 25-Hydroxy: 26.3 ng/mL — ABNORMAL LOW (ref 30.0–100.0)

## 2022-02-24 LAB — LIPID PANEL W/O CHOL/HDL RATIO
Cholesterol, Total: 156 mg/dL (ref 100–199)
HDL: 56 mg/dL (ref >39)
LDL Chol Calc (NIH): 75 mg/dL (ref 0–99)
Triglycerides: 146 mg/dL (ref 0–149)
VLDL Cholesterol Cal: 25 mg/dL (ref 5–40)

## 2022-02-28 NOTE — Progress Notes (Signed)
Contacted via MyChart   Good morning Cindy, your chest x-ray returned and is nice and normal.  Great news!!

## 2022-03-04 ENCOUNTER — Ambulatory Visit (INDEPENDENT_AMBULATORY_CARE_PROVIDER_SITE_OTHER): Payer: Medicare Other | Admitting: Dermatology

## 2022-03-04 ENCOUNTER — Encounter: Payer: Self-pay | Admitting: Dermatology

## 2022-03-04 VITALS — BP 172/86 | HR 59

## 2022-03-04 DIAGNOSIS — D229 Melanocytic nevi, unspecified: Secondary | ICD-10-CM

## 2022-03-04 DIAGNOSIS — L820 Inflamed seborrheic keratosis: Secondary | ICD-10-CM

## 2022-03-04 DIAGNOSIS — L920 Granuloma annulare: Secondary | ICD-10-CM

## 2022-03-04 DIAGNOSIS — L570 Actinic keratosis: Secondary | ICD-10-CM | POA: Diagnosis not present

## 2022-03-04 DIAGNOSIS — L578 Other skin changes due to chronic exposure to nonionizing radiation: Secondary | ICD-10-CM

## 2022-03-04 DIAGNOSIS — Z1283 Encounter for screening for malignant neoplasm of skin: Secondary | ICD-10-CM | POA: Diagnosis not present

## 2022-03-04 DIAGNOSIS — L821 Other seborrheic keratosis: Secondary | ICD-10-CM | POA: Diagnosis not present

## 2022-03-04 DIAGNOSIS — Z85828 Personal history of other malignant neoplasm of skin: Secondary | ICD-10-CM | POA: Diagnosis not present

## 2022-03-04 DIAGNOSIS — Z79899 Other long term (current) drug therapy: Secondary | ICD-10-CM

## 2022-03-04 DIAGNOSIS — L814 Other melanin hyperpigmentation: Secondary | ICD-10-CM | POA: Diagnosis not present

## 2022-03-04 MED ORDER — DAPSONE 7.5 % EX GEL: CUTANEOUS | 2 refills | Status: DC

## 2022-03-04 NOTE — Patient Instructions (Signed)
Due to recent changes in healthcare laws, you may see results of your pathology and/or laboratory studies on MyChart before the doctors have had a chance to review them. We understand that in some cases there may be results that are confusing or concerning to you. Please understand that not all results are received at the same time and often the doctors may need to interpret multiple results in order to provide you with the best plan of care or course of treatment. Therefore, we ask that you please give us 2 business days to thoroughly review all your results before contacting the office for clarification. Should we see a critical lab result, you will be contacted sooner.   If You Need Anything After Your Visit  If you have any questions or concerns for your doctor, please call our main line at [phone] and press option 4 to reach your doctor's medical assistant. If no one answers, please leave a voicemail as directed and we will return your call as soon as possible. Messages left after 4 pm will be answered the following business day.   You may also send us a message via MyChart. We typically respond to MyChart messages within 1-2 business days.  For prescription refills, please ask your pharmacy to contact our office. Our fax number is [phone].  If you have an urgent issue when the clinic is closed that cannot wait until the next business day, you can page your doctor at the number below.    Please note that while we do our best to be available for urgent issues outside of office hours, we are not available 24/7.   If you have an urgent issue and are unable to reach us, you may choose to seek medical care at your doctor's office, retail clinic, urgent care center, or emergency room.  If you have a medical emergency, please immediately call 911 or go to the emergency department.  Pager Numbers  - Dr. Kowalski: [phone]  - Dr. Moye: [phone]  - Dr. Stewart:  [phone]  In the event of inclement weather, please call our main line at [phone] for an update on the status of any delays or closures.  Dermatology Medication Tips: Please keep the boxes that topical medications come in in order to help keep track of the instructions about where and how to use these. Pharmacies typically print the medication instructions only on the boxes and not directly on the medication tubes.   If your medication is too expensive, please contact our office at [phone] option 4 or send us a message through MyChart.   We are unable to tell what your co-pay for medications will be in advance as this is different depending on your insurance coverage. However, we may be able to find a substitute medication at lower cost or fill out paperwork to get insurance to cover a needed medication.   If a prior authorization is required to get your medication covered by your insurance company, please allow us 1-2 business days to complete this process.  Drug prices often vary depending on where the prescription is filled and some pharmacies may offer cheaper prices.  The website www.goodrx.com contains coupons for medications through different pharmacies. The prices here do not account for what the cost may be with help from insurance (it may be cheaper with your insurance), but the website can give you the price if you did not use any insurance.  - You can print the associated coupon and take it with   your prescription to the pharmacy.  - You may also stop by our office during regular business hours and pick up a GoodRx coupon card.  - If you need your prescription sent electronically to a different pharmacy, notify our office through [facility] MyChart or by phone at [phone] option 4.     Si Usted Necesita Algo Despus de Su Visita  Tambin puede enviarnos un mensaje a travs de MyChart. Por lo general respondemos a los mensajes de MyChart en el transcurso de 1 a 2  das hbiles.  Para renovar recetas, por favor pida a su farmacia que se ponga en contacto con nuestra oficina. Nuestro nmero de fax es el [phone].  Si tiene un asunto urgente cuando la clnica est cerrada y que no puede esperar hasta el siguiente da hbil, puede llamar/localizar a su doctor(a) al nmero que aparece a continuacin.   Por favor, tenga en cuenta que aunque hacemos todo lo posible para estar disponibles para asuntos urgentes fuera del horario de oficina, no estamos disponibles las 24 horas del da, los 7 das de la semana.   Si tiene un problema urgente y no puede comunicarse con nosotros, puede optar por buscar atencin mdica  en el consultorio de su doctor(a), en una clnica privada, en un centro de atencin urgente o en una sala de emergencias.  Si tiene una emergencia mdica, por favor llame inmediatamente al 911 o vaya a la sala de emergencias.  Nmeros de bper  - Dr. Kowalski: [phone]  - Dra. Moye: [phone]  - Dra. Stewart: [phone]  En caso de inclemencias del tiempo, por favor llame a nuestra lnea principal al [phone] para una actualizacin sobre el estado de cualquier retraso o cierre.  Consejos para la medicacin en dermatologa: Por favor, guarde las cajas en las que vienen los medicamentos de uso tpico para ayudarle a seguir las instrucciones sobre dnde y cmo usarlos. Las farmacias generalmente imprimen las instrucciones del medicamento slo en las cajas y no directamente en los tubos del medicamento.   Si su medicamento es muy caro, por favor, pngase en contacto con nuestra oficina llamando al [phone] y presione la opcin 4 o envenos un mensaje a travs de MyChart.   No podemos decirle cul ser su copago por los medicamentos por adelantado ya que esto es diferente dependiendo de la cobertura de su seguro. Sin embargo, es posible que podamos encontrar un medicamento sustituto a menor costo o llenar un formulario para que el  seguro cubra el medicamento que se considera necesario.   Si se requiere una autorizacin previa para que su compaa de seguros cubra su medicamento, por favor permtanos de 1 a 2 das hbiles para completar este proceso.  Los precios de los medicamentos varan con frecuencia dependiendo del lugar de dnde se surte la receta y alguna farmacias pueden ofrecer precios ms baratos.  El sitio web www.goodrx.com tiene cupones para medicamentos de diferentes farmacias. Los precios aqu no tienen en cuenta lo que podra costar con la ayuda del seguro (puede ser ms barato con su seguro), pero el sitio web puede darle el precio si no utiliz ningn seguro.  - Puede imprimir el cupn correspondiente y llevarlo con su receta a la farmacia.  - Tambin puede pasar por nuestra oficina durante el horario de atencin regular y recoger una tarjeta de cupones de GoodRx.  - Si necesita que su receta se enve electrnicamente a una farmacia diferente, informe a nuestra oficina a travs de MyChart de [facility]   o por telfono llamando al [phone] y presione la opcin 4.  

## 2022-03-04 NOTE — Progress Notes (Signed)
Follow-Up Visit   Subjective  Tamara Fleming is a 76 y.o. female who presents for the following: Annual Exam (Hx BCC, Aks, ISKs - patient has noticed several irregular, irritated skin lesions that she would like checked and treated). The patient presents for Total-Body Skin Exam (TBSE) for skin cancer screening and mole check.  The patient has spots, moles and lesions to be evaluated, some may be new or changing and the patient has concerns that these could be cancer.  The following portions of the chart were reviewed this encounter and updated as appropriate:   Tobacco  Allergies  Meds  Problems  Med Hx  Surg Hx  Fam Hx     Review of Systems:  No other skin or systemic complaints except as noted in HPI or Assessment and Plan.  Objective  Well appearing patient in no apparent distress; mood and affect are within normal limits.  A full examination was performed including scalp, head, eyes, ears, nose, lips, neck, chest, axillae, abdomen, back, buttocks, bilateral upper extremities, bilateral lower extremities, hands, feet, fingers, toes, fingernails, and toenails. All findings within normal limits unless otherwise noted below.  L cheek x 1, L ear lobe x 1 (2) Erythematous thin papules/macules with gritty scale.   Upper back x 5, chest  x 14, R wrist x 1, R thigh x 1 (21) Erythematous stuck-on, waxy papule or plaque   Assessment & Plan  AK (actinic keratosis) (2) L cheek x 1, L ear lobe x 1 Destruction of lesion - L cheek x 1, L ear lobe x 1 Complexity: simple   Destruction method: cryotherapy   Informed consent: discussed and consent obtained   Timeout:  patient name, date of birth, surgical site, and procedure verified Lesion destroyed using liquid nitrogen: Yes   Region frozen until ice ball extended beyond lesion: Yes   Outcome: patient tolerated procedure well with no complications   Post-procedure details: wound care instructions given    Inflamed seborrheic  keratosis (21) Upper back x 5, chest  x 14, R wrist x 1, R thigh x 1 Symptomatic, irritating, patient would like treated. Destruction of lesion - Upper back x 5, chest  x 14, R wrist x 1, R thigh x 1 Complexity: simple   Destruction method: cryotherapy   Informed consent: discussed and consent obtained   Timeout:  patient name, date of birth, surgical site, and procedure verified Lesion destroyed using liquid nitrogen: Yes   Region frozen until ice ball extended beyond lesion: Yes   Outcome: patient tolerated procedure well with no complications   Post-procedure details: wound care instructions given    Granuloma annulare B/L leg Chronic and persistent condition with duration or expected duration over one year. Condition is symptomatic / bothersome to patient. Not to goal. Continue Dapsone 7.5% gel QD.  Discussed oral Dapsone but not recommended at this time due to s/e. Benign-appearing.  Observation.  Call clinic for new or changing lesions.  Recommend daily use of broad spectrum spf 30+ sunscreen to sun-exposed areas.   Related Medications Dapsone 7.5 % GEL Apply to legs once a day  Lentigines - Scattered tan macules - Due to sun exposure - Benign-appearing, observe - Recommend daily broad spectrum sunscreen SPF 30+ to sun-exposed areas, reapply every 2 hours as needed. - Call for any changes  Seborrheic Keratoses - Stuck-on, waxy, tan-brown papules and/or plaques  - Benign-appearing - Discussed benign etiology and prognosis. - Observe - Call for any changes  Melanocytic Nevi -  Tan-brown and/or pink-flesh-colored symmetric macules and papules - Benign appearing on exam today - Observation - Call clinic for new or changing moles - Recommend daily use of broad spectrum spf 30+ sunscreen to sun-exposed areas.   Hemangiomas - Red papules - Discussed benign nature - Observe - Call for any changes  Actinic Damage - Chronic condition, secondary to cumulative UV/sun  exposure - diffuse scaly erythematous macules with underlying dyspigmentation - Recommend daily broad spectrum sunscreen SPF 30+ to sun-exposed areas, reapply every 2 hours as needed.  - Staying in the shade or wearing long sleeves, sun glasses (UVA+UVB protection) and wide brim hats (4-inch brim around the entire circumference of the hat) are also recommended for sun protection.  - Call for new or changing lesions.  History of Basal Cell Carcinoma of the Skin - No evidence of recurrence today - Recommend regular full body skin exams - Recommend daily broad spectrum sunscreen SPF 30+ to sun-exposed areas, reapply every 2 hours as needed.  - Call if any new or changing lesions are noted between office visits  Skin cancer screening performed today.  Return in about 6 months (around 09/02/2022) for AK and ISK follow up.  Luther Redo, CMA, am acting as scribe for Sarina Ser, MD . Documentation: I have reviewed the above documentation for accuracy and completeness, and I agree with the above.  Sarina Ser, MD

## 2022-03-09 ENCOUNTER — Ambulatory Visit (INDEPENDENT_AMBULATORY_CARE_PROVIDER_SITE_OTHER): Payer: Medicare Other | Admitting: Family Medicine

## 2022-03-09 ENCOUNTER — Encounter: Payer: Self-pay | Admitting: Family Medicine

## 2022-03-09 ENCOUNTER — Telehealth: Payer: Self-pay

## 2022-03-09 VITALS — BP 117/70 | HR 65 | Temp 98.5°F | Ht 65.0 in | Wt 188.8 lb

## 2022-03-09 DIAGNOSIS — Z1231 Encounter for screening mammogram for malignant neoplasm of breast: Secondary | ICD-10-CM

## 2022-03-09 DIAGNOSIS — J0100 Acute maxillary sinusitis, unspecified: Secondary | ICD-10-CM

## 2022-03-09 DIAGNOSIS — L299 Pruritus, unspecified: Secondary | ICD-10-CM

## 2022-03-09 MED ORDER — TRIAMCINOLONE ACETONIDE 40 MG/ML IJ SUSP
40.0000 mg | Freq: Once | INTRAMUSCULAR | Status: AC
  Administered 2022-03-09: 40 mg via INTRAMUSCULAR

## 2022-03-09 MED ORDER — TRIAMCINOLONE ACETONIDE 0.5 % EX OINT: 1.0000 | TOPICAL_OINTMENT | Freq: Two times a day (BID) | CUTANEOUS | 0 refills | Status: DC

## 2022-03-09 NOTE — Progress Notes (Signed)
BP 117/70   Pulse 65   Temp 98.5 F (36.9 C) (Oral)   Ht _0  (1.651 m)   Wt 188 lb 12.8 oz (85.6 kg)   LMP  (LMP Unknown)   SpO2 98%   BMI 31.42 kg/m    Subjective:    Patient ID: Tamara Fleming, female    DOB: April 15, 1945, 76 y.o.   MRN: [id number]  HPI: Tamara Fleming is a 76 y.o. female  Chief Complaint  Patient presents with   Pruritis    Patient says she is noticing some itching in her hands and sometimes in her ankles over the past weekend. Patient says she did not take anything to help with the itching it comes and goes. Patient says she recently had 24 area froze off at the Dermatologist Wednesday and that has been the only change.    URI   Feeling more like herself. She notes that her energy is still a little bit low, but her URI symptoms have resolved.   Has been having itching on the palms of her hands. She notes that it started Friday after putting up the christmas tree. She notes that it has continued. She has had itching on the tops of her feet and her ankles. She notes that it has been making her want to pull her skin off. She notes that she had some spots frozen off her chest, face, upper back and arms. Not bothering her at night, but is bothering her in the AM.  Relevant past medical, surgical, family and social history reviewed and updated as indicated. Interim medical history since our last visit reviewed. Allergies and medications reviewed and updated.  Review of Systems  Constitutional: Negative.   Respiratory: Negative.    Cardiovascular: Negative.   Gastrointestinal: Negative.   Musculoskeletal: Negative.   Skin:        itching  Neurological: Negative.   Psychiatric/Behavioral: Negative.      Per HPI unless specifically indicated above     Objective:    BP 117/70   Pulse 65   Temp 98.5 F (36.9 C) (Oral)   Ht _1  (1.651 m)   Wt 188 lb 12.8 oz (85.6 kg)   LMP  (LMP Unknown)   SpO2 98%   BMI 31.42 kg/m   Wt Readings from Last 3  Encounters:  03/09/22 188 lb 12.8 oz (85.6 kg)  02/23/22 189 lb 4.8 oz (85.9 kg)  02/10/22 184 lb (83.5 kg)    Physical Exam Vitals and nursing note reviewed.  Constitutional:      General: She is not in acute distress.    Appearance: Normal appearance. She is normal weight. She is not ill-appearing, toxic-appearing or diaphoretic.  HENT:     Head: Normocephalic and atraumatic.     Right Ear: External ear normal.     Left Ear: External ear normal.     Nose: Nose normal.     Mouth/Throat:     Mouth: Mucous membranes are moist.     Pharynx: Oropharynx is clear.  Eyes:     General: No scleral icterus.       Right eye: No discharge.        Left eye: No discharge.     Extraocular Movements: Extraocular movements intact.     Conjunctiva/sclera: Conjunctivae normal.     Pupils: Pupils are equal, round, and reactive to light.  Cardiovascular:     Rate and Rhythm: Normal rate and regular rhythm.  Pulses: Normal pulses.     Heart sounds: Normal heart sounds. No murmur heard.    No friction rub. No gallop.  Pulmonary:     Effort: Pulmonary effort is normal. No respiratory distress.     Breath sounds: Normal breath sounds. No stridor. No wheezing, rhonchi or rales.  Chest:     Chest wall: No tenderness.  Musculoskeletal:        General: Normal range of motion.     Cervical back: Normal range of motion and neck supple.  Skin:    General: Skin is warm and dry.     Capillary Refill: Capillary refill takes less than 2 seconds.     Coloration: Skin is not jaundiced or pale.     Findings: No bruising, erythema, lesion or rash.  Neurological:     General: No focal deficit present.     Mental Status: She is alert and oriented to person, place, and time. Mental status is at baseline.  Psychiatric:        Mood and Affect: Mood normal.        Behavior: Behavior normal.        Thought Content: Thought content normal.        Judgment: Judgment normal.     Results for orders placed or  performed in visit on 02/23/22  Microscopic Examination   Urine  Result Value Ref Range   WBC, UA 0-5 0 - 5 /hpf   RBC, Urine None seen 0 - 2 /hpf   Epithelial Cells (non renal) 0-10 0 - 10 /hpf   Bacteria, UA None seen None seen/Few  CBC with Differential/Platelet  Result Value Ref Range   WBC 4.9 3.4 - 10.8 x10E3/uL   RBC 3.71 (L) 3.77 - 5.28 x10E6/uL   Hemoglobin 12.5 11.1 - 15.9 g/dL   Hematocrit 36.8 34.0 - 46.6 %   MCV 99 (H) 79 - 97 fL   MCH 33.7 (H) 26.6 - 33.0 pg   MCHC 34.0 31.5 - 35.7 g/dL   RDW 12.3 11.7 - 15.4 %   Platelets 162 150 - 450 x10E3/uL   Neutrophils 52 Not Estab. %   Lymphs 32 Not Estab. %   Monocytes 13 Not Estab. %   Eos 3 Not Estab. %   Basos 0 Not Estab. %   Neutrophils Absolute 2.5 1.4 - 7.0 x10E3/uL   Lymphocytes Absolute 1.6 0.7 - 3.1 x10E3/uL   Monocytes Absolute 0.7 0.1 - 0.9 x10E3/uL   EOS (ABSOLUTE) 0.1 0.0 - 0.4 x10E3/uL   Basophils Absolute 0.0 0.0 - 0.2 x10E3/uL   Immature Granulocytes 0 Not Estab. %   Immature Grans (Abs) 0.0 0.0 - 0.1 x10E3/uL  Comprehensive metabolic panel  Result Value Ref Range   Glucose 106 (H) 70 - 99 mg/dL   BUN 10 8 - 27 mg/dL   Creatinine, Ser 0.96 0.57 - 1.00 mg/dL   eGFR 62 >59 mL/min/1.73   BUN/Creatinine Ratio 10 (L) 12 - 28   Sodium 141 134 - 144 mmol/L   Potassium 3.9 3.5 - 5.2 mmol/L   Chloride 103 96 - 106 mmol/L   CO2 23 20 - 29 mmol/L   Calcium 9.3 8.7 - 10.3 mg/dL   Total Protein 6.4 6.0 - 8.5 g/dL   Albumin 4.1 3.8 - 4.8 g/dL   Globulin, Total 2.3 1.5 - 4.5 g/dL   Albumin/Globulin Ratio 1.8 1.2 - 2.2   Bilirubin Total 0.7 0.0 - 1.2 mg/dL   Alkaline Phosphatase 104 44 -  121 IU/L   AST 22 0 - 40 IU/L   ALT 26 0 - 32 IU/L  Lipid Panel w/o Chol/HDL Ratio  Result Value Ref Range   Cholesterol, Total 156 100 - 199 mg/dL   Triglycerides 146 0 - 149 mg/dL   HDL 56 >39 mg/dL   VLDL Cholesterol Cal 25 5 - 40 mg/dL   LDL Chol Calc (NIH) 75 0 - 99 mg/dL  Urinalysis, Routine w reflex microscopic   Result Value Ref Range   Specific Gravity, UA 1.020 1.005 - 1.030   pH, UA 6.5 5.0 - 7.5   Color, UA Yellow Yellow   Appearance Ur Clear Clear   Leukocytes,UA 1+ (A) Negative   Protein,UA Negative Negative/Trace   Glucose, UA Negative Negative   Ketones, UA Negative Negative   RBC, UA Negative Negative   Bilirubin, UA Negative Negative   Urobilinogen, Ur >8.0 (H) 0.2 - 1.0 mg/dL   Nitrite, UA Negative Negative   Microscopic Examination See below:   TSH  Result Value Ref Range   TSH 3.570 0.450 - 4.500 uIU/mL  Bayer DCA Hb A1c Waived  Result Value Ref Range   HB A1C (BAYER DCA - WAIVED) 6.3 (H) 4.8 - 5.6 %  Microalbumin, Urine Waived  Result Value Ref Range   Microalb, Ur Waived 10 0 - 19 mg/L   Creatinine, Urine Waived 200 10 - 300 mg/dL   Microalb/Creat Ratio <30 <30 mg/g  Uric acid  Result Value Ref Range   Uric Acid 5.8 3.1 - 7.9 mg/dL  VITAMIN D 25 Hydroxy (Vit-D Deficiency, Fractures)  Result Value Ref Range   Vit D, 25-Hydroxy 26.3 (L) 30.0 - 100.0 ng/mL  B12  Result Value Ref Range   Vitamin B-12 413 232 - 1,245 pg/mL      Assessment & Plan:   Problem List Items Addressed This Visit   None Visit Diagnoses     Itching    -  Primary   Will check labs and treat with triamcinalone injection and cream and zyrtec. Call if not getting better or getting worse.   Relevant Medications   triamcinolone acetonide (KENALOG-40) injection 40 mg   Other Relevant Orders   CBC with Differential/Platelet   Comprehensive metabolic panel   Rocky mtn spotted fvr abs pnl(IgG+IgM)   RPR   Acute non-recurrent maxillary sinusitis       Resolved. Continue to monitor. Call with any concerns.   Relevant Medications   triamcinolone acetonide (KENALOG-40) injection 40 mg   Encounter for screening mammogram for malignant neoplasm of breast       Mammo ordered.   Relevant Orders   MM 3D SCREEN BREAST BILATERAL        Follow up plan: Return in about 3 months (around  06/08/2022).

## 2022-03-09 NOTE — Telephone Encounter (Signed)
-----   Message from Valerie Roys, DO sent at 03/09/2022  2:30 PM EST ----- [facility] radiology mammo

## 2022-03-09 NOTE — Telephone Encounter (Signed)
Spoke with [facility] representative to get patient scheduled for mammogram. Representative says she had to change the order around according to patient's last mammogram and will send it back over for the provider to sign. Patient scheduled 04/24/2022 at 10:20 am.

## 2022-03-15 ENCOUNTER — Encounter: Payer: Self-pay | Admitting: Dermatology

## 2022-03-17 DIAGNOSIS — M47814 Spondylosis without myelopathy or radiculopathy, thoracic region: Secondary | ICD-10-CM | POA: Diagnosis not present

## 2022-03-17 DIAGNOSIS — M546 Pain in thoracic spine: Secondary | ICD-10-CM | POA: Diagnosis not present

## 2022-03-17 DIAGNOSIS — M791 Myalgia, unspecified site: Secondary | ICD-10-CM | POA: Diagnosis not present

## 2022-03-17 DIAGNOSIS — Z79891 Long term (current) use of opiate analgesic: Secondary | ICD-10-CM | POA: Diagnosis not present

## 2022-03-31 ENCOUNTER — Ambulatory Visit: Admit: 2022-03-31 | Payer: Medicare Other

## 2022-04-22 DIAGNOSIS — M546 Pain in thoracic spine: Secondary | ICD-10-CM | POA: Diagnosis not present

## 2022-04-22 DIAGNOSIS — M47814 Spondylosis without myelopathy or radiculopathy, thoracic region: Secondary | ICD-10-CM | POA: Diagnosis not present

## 2022-04-22 DIAGNOSIS — Z79891 Long term (current) use of opiate analgesic: Secondary | ICD-10-CM | POA: Diagnosis not present

## 2022-04-22 DIAGNOSIS — M791 Myalgia, unspecified site: Secondary | ICD-10-CM | POA: Diagnosis not present

## 2022-04-24 ENCOUNTER — Ambulatory Visit
Admission: RE | Admit: 2022-04-24 | Discharge: 2022-04-24 | Disposition: A | Discharge status: Home / Self Care | Payer: Medicare Other | Source: Ambulatory Visit | Attending: Family Medicine | Admitting: Family Medicine

## 2022-04-24 DIAGNOSIS — Z1231 Encounter for screening mammogram for malignant neoplasm of breast: Secondary | ICD-10-CM

## 2022-04-24 DIAGNOSIS — R922 Inconclusive mammogram: Secondary | ICD-10-CM | POA: Diagnosis not present

## 2022-05-14 ENCOUNTER — Ambulatory Visit (INDEPENDENT_AMBULATORY_CARE_PROVIDER_SITE_OTHER): Payer: Medicare Other | Admitting: Family Medicine

## 2022-05-14 ENCOUNTER — Encounter: Payer: Self-pay | Admitting: Family Medicine

## 2022-05-14 VITALS — BP 149/78 | HR 72 | Temp 98.7°F | Ht 65.0 in | Wt 185.8 lb

## 2022-05-14 DIAGNOSIS — E538 Deficiency of other specified B group vitamins: Secondary | ICD-10-CM | POA: Diagnosis not present

## 2022-05-14 DIAGNOSIS — T148XXA Other injury of unspecified body region, initial encounter: Secondary | ICD-10-CM

## 2022-05-14 MED ORDER — CYANOCOBALAMIN 1000 MCG/ML IJ SOLN
1000.0000 ug | Freq: Once | INTRAMUSCULAR | Status: AC
  Administered 2022-05-14: 1000 ug via INTRAMUSCULAR

## 2022-05-14 NOTE — Progress Notes (Signed)
BP (!) 149/78 (BP Location: Left Arm, Cuff Size: Normal)   Pulse 72   Temp 98.7 F (37.1 C) (Oral)   Ht '5\' 5"'$  (1.651 m)   Wt 185 lb 12.8 oz (84.3 kg)   LMP  (LMP Unknown)   SpO2 97%   BMI 30.92 kg/m    Subjective:    Patient ID: Tamara Fleming, female    DOB: 09/29/1945, 77 y.o.   MRN: [id number]  HPI: SHIREL MALLIS is a 77 y.o. female  Chief Complaint  Patient presents with   Leg Problem    Patient noticed what looks like bruises on her inner leg about 2 days ago. Patient denies having any pain.   SKIN LESION Duration:  2 days Location: R inner thigh Painful: no Itching: no Onset: sudden Context: not changing Associated signs and symptoms: none History of skin cancer: no History of precancerous skin lesions: no Family history of skin cancer: no  Relevant past medical, surgical, family and social history reviewed and updated as indicated. Interim medical history since our last visit reviewed. Allergies and medications reviewed and updated.  Review of Systems  Constitutional: Negative.   Respiratory: Negative.    Cardiovascular: Negative.   Musculoskeletal: Negative.   Skin:  Positive for color change. Negative for pallor, rash and wound.  Psychiatric/Behavioral: Negative.      Per HPI unless specifically indicated above     Objective:    BP (!) 149/78 (BP Location: Left Arm, Cuff Size: Normal)   Pulse 72   Temp 98.7 F (37.1 C) (Oral)   Ht '5\' 5"'$  (1.651 m)   Wt 185 lb 12.8 oz (84.3 kg)   LMP  (LMP Unknown)   SpO2 97%   BMI 30.92 kg/m   Wt Readings from Last 3 Encounters:  05/14/22 185 lb 12.8 oz (84.3 kg)  03/09/22 188 lb 12.8 oz (85.6 kg)  02/23/22 189 lb 4.8 oz (85.9 kg)    Physical Exam Vitals and nursing note reviewed.  Constitutional:      General: She is not in acute distress.    Appearance: Normal appearance. She is not ill-appearing, toxic-appearing or diaphoretic.  HENT:     Head: Normocephalic and atraumatic.     Right Ear:  External ear normal.     Left Ear: External ear normal.     Nose: Nose normal.     Mouth/Throat:     Mouth: Mucous membranes are moist.     Pharynx: Oropharynx is clear.  Eyes:     General: No scleral icterus.       Right eye: No discharge.        Left eye: No discharge.     Extraocular Movements: Extraocular movements intact.     Conjunctiva/sclera: Conjunctivae normal.     Pupils: Pupils are equal, round, and reactive to light.  Cardiovascular:     Rate and Rhythm: Normal rate and regular rhythm.     Pulses: Normal pulses.     Heart sounds: Normal heart sounds. No murmur heard.    No friction rub. No gallop.  Pulmonary:     Effort: Pulmonary effort is normal. No respiratory distress.     Breath sounds: Normal breath sounds. No stridor. No wheezing, rhonchi or rales.  Chest:     Chest wall: No tenderness.  Musculoskeletal:        General: Normal range of motion.     Cervical back: Normal range of motion and neck supple.  Skin:  General: Skin is warm and dry.     Capillary Refill: Capillary refill takes less than 2 seconds.     Coloration: Skin is not jaundiced or pale.     Findings: No bruising, erythema, lesion or rash.  Neurological:     General: No focal deficit present.     Mental Status: She is alert and oriented to person, place, and time. Mental status is at baseline.  Psychiatric:        Mood and Affect: Mood normal.        Behavior: Behavior normal.        Thought Content: Thought content normal.        Judgment: Judgment normal.     Results for orders placed or performed in visit on 02/23/22  Microscopic Examination   Urine  Result Value Ref Range   WBC, UA 0-5 0 - 5 /hpf   RBC, Urine None seen 0 - 2 /hpf   Epithelial Cells (non renal) 0-10 0 - 10 /hpf   Bacteria, UA None seen None seen/Few  CBC with Differential/Platelet  Result Value Ref Range   WBC 4.9 3.4 - 10.8 x10E3/uL   RBC 3.71 (L) 3.77 - 5.28 x10E6/uL   Hemoglobin 12.5 11.1 - 15.9 g/dL    Hematocrit 36.8 34.0 - 46.6 %   MCV 99 (H) 79 - 97 fL   MCH 33.7 (H) 26.6 - 33.0 pg   MCHC 34.0 31.5 - 35.7 g/dL   RDW 12.3 11.7 - 15.4 %   Platelets 162 150 - 450 x10E3/uL   Neutrophils 52 Not Estab. %   Lymphs 32 Not Estab. %   Monocytes 13 Not Estab. %   Eos 3 Not Estab. %   Basos 0 Not Estab. %   Neutrophils Absolute 2.5 1.4 - 7.0 x10E3/uL   Lymphocytes Absolute 1.6 0.7 - 3.1 x10E3/uL   Monocytes Absolute 0.7 0.1 - 0.9 x10E3/uL   EOS (ABSOLUTE) 0.1 0.0 - 0.4 x10E3/uL   Basophils Absolute 0.0 0.0 - 0.2 x10E3/uL   Immature Granulocytes 0 Not Estab. %   Immature Grans (Abs) 0.0 0.0 - 0.1 x10E3/uL  Comprehensive metabolic panel  Result Value Ref Range   Glucose 106 (H) 70 - 99 mg/dL   BUN 10 8 - 27 mg/dL   Creatinine, Ser 0.96 0.57 - 1.00 mg/dL   eGFR 62 >59 mL/min/1.73   BUN/Creatinine Ratio 10 (L) 12 - 28   Sodium 141 134 - 144 mmol/L   Potassium 3.9 3.5 - 5.2 mmol/L   Chloride 103 96 - 106 mmol/L   CO2 23 20 - 29 mmol/L   Calcium 9.3 8.7 - 10.3 mg/dL   Total Protein 6.4 6.0 - 8.5 g/dL   Albumin 4.1 3.8 - 4.8 g/dL   Globulin, Total 2.3 1.5 - 4.5 g/dL   Albumin/Globulin Ratio 1.8 1.2 - 2.2   Bilirubin Total 0.7 0.0 - 1.2 mg/dL   Alkaline Phosphatase 104 44 - 121 IU/L   AST 22 0 - 40 IU/L   ALT 26 0 - 32 IU/L  Lipid Panel w/o Chol/HDL Ratio  Result Value Ref Range   Cholesterol, Total 156 100 - 199 mg/dL   Triglycerides 146 0 - 149 mg/dL   HDL 56 >39 mg/dL   VLDL Cholesterol Cal 25 5 - 40 mg/dL   LDL Chol Calc (NIH) 75 0 - 99 mg/dL  Urinalysis, Routine w reflex microscopic  Result Value Ref Range   Specific Gravity, UA 1.020 1.005 - 1.030  pH, UA 6.5 5.0 - 7.5   Color, UA Yellow Yellow   Appearance Ur Clear Clear   Leukocytes,UA 1+ (A) Negative   Protein,UA Negative Negative/Trace   Glucose, UA Negative Negative   Ketones, UA Negative Negative   RBC, UA Negative Negative   Bilirubin, UA Negative Negative   Urobilinogen, Ur >8.0 (H) 0.2 - 1.0 mg/dL   Nitrite,  UA Negative Negative   Microscopic Examination See below:   TSH  Result Value Ref Range   TSH 3.570 0.450 - 4.500 uIU/mL  Bayer DCA Hb A1c Waived  Result Value Ref Range   HB A1C (BAYER DCA - WAIVED) 6.3 (H) 4.8 - 5.6 %  Microalbumin, Urine Waived  Result Value Ref Range   Microalb, Ur Waived 10 0 - 19 mg/L   Creatinine, Urine Waived 200 10 - 300 mg/dL   Microalb/Creat Ratio <30 <30 mg/g  Uric acid  Result Value Ref Range   Uric Acid 5.8 3.1 - 7.9 mg/dL  VITAMIN D 25 Hydroxy (Vit-D Deficiency, Fractures)  Result Value Ref Range   Vit D, 25-Hydroxy 26.3 (L) 30.0 - 100.0 ng/mL  B12  Result Value Ref Range   Vitamin B-12 413 232 - 1,245 pg/mL      Assessment & Plan:   Problem List Items Addressed This Visit       Other   B12 deficiency    B12 shot given today.       Relevant Medications   cyanocobalamin (VITAMIN B12) injection 1,000 mcg (Start on 05/14/2022  9:00 AM)   Other Visit Diagnoses     Bruise    -  Primary   Reassured patient. Will check CBC to check platelets. Call with any concerns.   Relevant Orders   CBC with Differential/Platelet        Follow up plan: Return follow up as scheduled.

## 2022-05-14 NOTE — Assessment & Plan Note (Signed)
B12 shot given today  

## 2022-05-15 LAB — CBC WITH DIFFERENTIAL/PLATELET
Basophils Absolute: 0 10*3/uL (ref 0.0–0.2)
Basos: 0 %
EOS (ABSOLUTE): 0.1 10*3/uL (ref 0.0–0.4)
Eos: 2 %
Hematocrit: 39 % (ref 34.0–46.6)
Hemoglobin: 13.4 g/dL (ref 11.1–15.9)
Immature Grans (Abs): 0 10*3/uL (ref 0.0–0.1)
Immature Granulocytes: 0 %
Lymphocytes Absolute: 1.6 10*3/uL (ref 0.7–3.1)
Lymphs: 31 %
MCH: 33.8 pg — ABNORMAL HIGH (ref 26.6–33.0)
MCHC: 34.4 g/dL (ref 31.5–35.7)
MCV: 98 fL — ABNORMAL HIGH (ref 79–97)
Monocytes Absolute: 0.7 10*3/uL (ref 0.1–0.9)
Monocytes: 12 %
Neutrophils Absolute: 2.8 10*3/uL (ref 1.4–7.0)
Neutrophils: 55 %
Platelets: 158 10*3/uL (ref 150–450)
RBC: 3.97 x10E6/uL (ref 3.77–5.28)
RDW: 12.1 % (ref 11.7–15.4)
WBC: 5.2 10*3/uL (ref 3.4–10.8)

## 2022-05-19 DIAGNOSIS — M47814 Spondylosis without myelopathy or radiculopathy, thoracic region: Secondary | ICD-10-CM | POA: Diagnosis not present

## 2022-05-19 DIAGNOSIS — M545 Low back pain, unspecified: Secondary | ICD-10-CM | POA: Diagnosis not present

## 2022-05-19 DIAGNOSIS — Z79891 Long term (current) use of opiate analgesic: Secondary | ICD-10-CM | POA: Diagnosis not present

## 2022-05-19 DIAGNOSIS — M546 Pain in thoracic spine: Secondary | ICD-10-CM | POA: Diagnosis not present

## 2022-05-19 DIAGNOSIS — M791 Myalgia, unspecified site: Secondary | ICD-10-CM | POA: Diagnosis not present

## 2022-06-08 ENCOUNTER — Ambulatory Visit (INDEPENDENT_AMBULATORY_CARE_PROVIDER_SITE_OTHER): Payer: Medicare Other | Admitting: Family Medicine

## 2022-06-08 ENCOUNTER — Ambulatory Visit (INDEPENDENT_AMBULATORY_CARE_PROVIDER_SITE_OTHER): Payer: Medicare Other | Admitting: Dermatology

## 2022-06-08 ENCOUNTER — Encounter: Payer: Self-pay | Admitting: Family Medicine

## 2022-06-08 VITALS — BP 127/65 | HR 86

## 2022-06-08 VITALS — BP 138/65 | HR 63 | Temp 97.9°F | Ht 65.0 in | Wt 187.6 lb

## 2022-06-08 DIAGNOSIS — D692 Other nonthrombocytopenic purpura: Secondary | ICD-10-CM

## 2022-06-08 DIAGNOSIS — E7800 Pure hypercholesterolemia, unspecified: Secondary | ICD-10-CM | POA: Diagnosis not present

## 2022-06-08 DIAGNOSIS — L501 Idiopathic urticaria: Secondary | ICD-10-CM | POA: Diagnosis not present

## 2022-06-08 DIAGNOSIS — I10 Essential (primary) hypertension: Secondary | ICD-10-CM

## 2022-06-08 DIAGNOSIS — R8281 Pyuria: Secondary | ICD-10-CM | POA: Diagnosis not present

## 2022-06-08 DIAGNOSIS — I700 Atherosclerosis of aorta: Secondary | ICD-10-CM | POA: Diagnosis not present

## 2022-06-08 DIAGNOSIS — R7301 Impaired fasting glucose: Secondary | ICD-10-CM | POA: Diagnosis not present

## 2022-06-08 DIAGNOSIS — M1A9XX0 Chronic gout, unspecified, without tophus (tophi): Secondary | ICD-10-CM | POA: Diagnosis not present

## 2022-06-08 DIAGNOSIS — E538 Deficiency of other specified B group vitamins: Secondary | ICD-10-CM | POA: Diagnosis not present

## 2022-06-08 DIAGNOSIS — Z79899 Other long term (current) drug therapy: Secondary | ICD-10-CM

## 2022-06-08 DIAGNOSIS — E559 Vitamin D deficiency, unspecified: Secondary | ICD-10-CM

## 2022-06-08 DIAGNOSIS — F419 Anxiety disorder, unspecified: Secondary | ICD-10-CM

## 2022-06-08 DIAGNOSIS — R21 Rash and other nonspecific skin eruption: Secondary | ICD-10-CM

## 2022-06-08 LAB — URINALYSIS, ROUTINE W REFLEX MICROSCOPIC
Glucose, UA: NEGATIVE
Ketones, UA: NEGATIVE
Nitrite, UA: NEGATIVE
Specific Gravity, UA: 1.02 (ref 1.005–1.030)
Urobilinogen, Ur: 1 mg/dL (ref 0.2–1.0)
pH, UA: 6 (ref 5.0–7.5)

## 2022-06-08 LAB — MICROALBUMIN, URINE WAIVED
Creatinine, Urine Waived: 300 mg/dL (ref 10–300)
Microalb, Ur Waived: 80 mg/L — ABNORMAL HIGH (ref 0–19)

## 2022-06-08 LAB — MICROSCOPIC EXAMINATION: Bacteria, UA: NONE SEEN

## 2022-06-08 LAB — BAYER DCA HB A1C WAIVED: HB A1C (BAYER DCA - WAIVED): 6 % — ABNORMAL HIGH (ref 4.8–5.6)

## 2022-06-08 MED ORDER — LOSARTAN POTASSIUM 50 MG PO TABS: 50.0000 mg | ORAL_TABLET | Freq: Every day | ORAL | 1 refills | Status: DC

## 2022-06-08 MED ORDER — ALPRAZOLAM 0.5 MG PO TABS: ORAL_TABLET | ORAL | 0 refills | Status: DC

## 2022-06-08 MED ORDER — SPIRONOLACTONE 25 MG PO TABS: 25.0000 mg | ORAL_TABLET | Freq: Every day | ORAL | 1 refills | Status: DC

## 2022-06-08 MED ORDER — INDOMETHACIN 50 MG PO CAPS: 50.0000 mg | ORAL_CAPSULE | Freq: Three times a day (TID) | ORAL | 2 refills | Status: DC

## 2022-06-08 MED ORDER — BUDESONIDE-FORMOTEROL FUMARATE 160-4.5 MCG/ACT IN AERO: 2.0000 | INHALATION_SPRAY | Freq: Two times a day (BID) | RESPIRATORY_TRACT | 3 refills | Status: DC

## 2022-06-08 MED ORDER — OMEPRAZOLE MAGNESIUM 20 MG PO TBEC: 20.0000 mg | DELAYED_RELEASE_TABLET | Freq: Every day | ORAL | 1 refills | Status: DC

## 2022-06-08 MED ORDER — ATORVASTATIN CALCIUM 10 MG PO TABS: 10.0000 mg | ORAL_TABLET | Freq: Every day | ORAL | 2 refills | Status: DC

## 2022-06-08 NOTE — Assessment & Plan Note (Signed)
Under good control on current regimen. Continue current regimen. Continue to monitor. Call with any concerns. Refills given. Labs drawn today.   

## 2022-06-08 NOTE — Assessment & Plan Note (Signed)
Labs drawn today. Await results. Treat as needed.  

## 2022-06-08 NOTE — Progress Notes (Signed)
BP 138/65 (BP Location: Left Arm, Cuff Size: Normal)   Pulse 63   Temp 97.9 F (36.6 C) (Oral)   Ht '5\' 5"'$  (1.651 m)   Wt 187 lb 9.6 oz (85.1 kg)   LMP  (LMP Unknown)   SpO2 98%   BMI 31.22 kg/m    Subjective:    Patient ID: Tamara Fleming, female    DOB: December 26, 1945, 77 y.o.   MRN: [id number]  HPI: Tamara Fleming is a 77 y.o. female  Chief Complaint  Patient presents with   Pruritis    Patient says she is still having issues with itching and says she notices it after showers. Patient says it just comes and goes, but she notices it mainly after shower underneath both of her arms.    Hypertension   Hyperlipidemia   HYPERTENSION / HYPERLIPIDEMIA Satisfied with current treatment? yes Duration of hypertension: chronic BP monitoring frequency: not checking BP medication side effects: no Past BP meds: lasix, sprionalactone, losartan Duration of hyperlipidemia: chronic Cholesterol medication side effects: no Cholesterol supplements: none Past cholesterol medications: atorvastatin Medication compliance: excellent compliance Aspirin: yes Recent stressors: no Recurrent headaches: no Visual changes: no Palpitations: no Dyspnea: no Chest pain: no Lower extremity edema: no Dizzy/lightheaded: no  Impaired Fasting Glucose HbA1C:  Lab Results  Component Value Date   HGBA1C 6.0 (H) 06/08/2022   Duration of elevated blood sugar: chronic Polydipsia: no Polyuria: no Weight change: no Visual disturbance: no Glucose Monitoring: no Diabetic Education: Completed Family history of diabetes: yes  ANXIETY/STRESS Duration: chronic Status:stable Anxious mood: yes  Excessive worrying: yes Irritability: no  Sweating: yes Nausea: no Palpitations:no Hyperventilation: no Panic attacks: no Agoraphobia: no  Obscessions/compulsions: no Depressed mood: no    06/08/2022   11:23 AM 05/14/2022    8:37 AM 03/09/2022    2:07 PM 02/23/2022   11:33 AM 12/31/2021   11:17 AM   Depression screen PHQ 2/9  Decreased Interest 0 0 0 0 0  Down, Depressed, Hopeless 0 0 0 0 0  PHQ - 2 Score 0 0 0 0 0  Altered sleeping 0 0 0 0 1  Tired, decreased energy 0 0 0 0 1  Change in appetite 0 0 0 0 0  Feeling bad or failure about yourself  0 0 0 0 0  Trouble concentrating 0 0 0 0 0  Moving slowly or fidgety/restless 0 0 0 0 0  Suicidal thoughts 0 0 0 0 0  PHQ-9 Score 0 0 0 0 2  Difficult doing work/chores Not difficult at all Not difficult at all Not difficult at all Not difficult at all Not difficult at all   Anhedonia: no Weight changes: no Insomnia: no   Hypersomnia: yes Fatigue/loss of energy: yes Feelings of worthlessness: no Feelings of guilt: no Impaired concentration/indecisiveness: no Suicidal ideations: no  Crying spells: no Recent Stressors/Life Changes: no   Relationship problems: no   Family stress: no     Financial stress: no    Job stress: no    Recent death/loss: no  RASH Duration:  months  Location: generalized  Itching: yes Burning: yes Redness: yes Oozing: no Scaling: no Blisters: no Painful: no Fevers: no Change in detergents/soaps/personal care products: no Recent illness: no Recent travel:no History of same: yes Context: fluctuating Alleviating factors: nothing Treatments attempted: triamcinalone ointment Shortness of breath: no  Throat/tongue swelling: no Myalgias/arthralgias: yes  No gout flares. Tolerating medicine well. No other concerns.   Relevant past medical, surgical, family  and social history reviewed and updated as indicated. Interim medical history since our last visit reviewed. Allergies and medications reviewed and updated.  Review of Systems  Constitutional: Negative.   Respiratory: Negative.    Cardiovascular: Negative.   Gastrointestinal: Negative.   Musculoskeletal:  Positive for arthralgias. Negative for back pain, gait problem, joint swelling, myalgias, neck pain and neck stiffness.  Skin:  Positive  for color change and rash. Negative for pallor and wound.  Neurological: Negative.   Psychiatric/Behavioral: Negative.      Per HPI unless specifically indicated above     Objective:    BP 138/65 (BP Location: Left Arm, Cuff Size: Normal)   Pulse 63   Temp 97.9 F (36.6 C) (Oral)   Ht '5\' 5"'$  (1.651 m)   Wt 187 lb 9.6 oz (85.1 kg)   LMP  (LMP Unknown)   SpO2 98%   BMI 31.22 kg/m   Wt Readings from Last 3 Encounters:  06/08/22 187 lb 9.6 oz (85.1 kg)  05/14/22 185 lb 12.8 oz (84.3 kg)  03/09/22 188 lb 12.8 oz (85.6 kg)    Physical Exam Vitals and nursing note reviewed.  Constitutional:      General: She is not in acute distress.    Appearance: Normal appearance. She is not ill-appearing, toxic-appearing or diaphoretic.  HENT:     Head: Normocephalic and atraumatic.     Right Ear: External ear normal.     Left Ear: External ear normal.     Nose: Nose normal.     Mouth/Throat:     Mouth: Mucous membranes are moist.     Pharynx: Oropharynx is clear.  Eyes:     General: No scleral icterus.       Right eye: No discharge.        Left eye: No discharge.     Extraocular Movements: Extraocular movements intact.     Conjunctiva/sclera: Conjunctivae normal.     Pupils: Pupils are equal, round, and reactive to light.  Cardiovascular:     Rate and Rhythm: Normal rate and regular rhythm.     Pulses: Normal pulses.     Heart sounds: Normal heart sounds. No murmur heard.    No friction rub. No gallop.  Pulmonary:     Effort: Pulmonary effort is normal. No respiratory distress.     Breath sounds: Normal breath sounds. No stridor. No wheezing, rhonchi or rales.  Chest:     Chest wall: No tenderness.  Musculoskeletal:        General: Normal range of motion.     Cervical back: Normal range of motion and neck supple.  Skin:    General: Skin is warm and dry.     Capillary Refill: Capillary refill takes less than 2 seconds.     Coloration: Skin is not jaundiced or pale.      Findings: No bruising, erythema, lesion or rash.  Neurological:     General: No focal deficit present.     Mental Status: She is alert and oriented to person, place, and time. Mental status is at baseline.  Psychiatric:        Mood and Affect: Mood normal.        Behavior: Behavior normal.        Thought Content: Thought content normal.        Judgment: Judgment normal.     Results for orders placed or performed in visit on 06/08/22  Microscopic Examination   Urine  Result Value Ref Range  WBC, UA 6-10 (A) 0 - 5 /hpf   RBC, Urine 0-2 0 - 2 /hpf   Epithelial Cells (non renal) 0-10 0 - 10 /hpf   Bacteria, UA None seen None seen/Few  Microalbumin, Urine Waived  Result Value Ref Range   Microalb, Ur Waived 80 (H) 0 - 19 mg/L   Creatinine, Urine Waived 300 10 - 300 mg/dL   Microalb/Creat Ratio 30-300 (H) <30 mg/g  Bayer DCA Hb A1c Waived  Result Value Ref Range   HB A1C (BAYER DCA - WAIVED) 6.0 (H) 4.8 - 5.6 %  Urinalysis, Routine w reflex microscopic  Result Value Ref Range   Specific Gravity, UA 1.020 1.005 - 1.030   pH, UA 6.0 5.0 - 7.5   Color, UA Yellow Yellow   Appearance Ur Cloudy (A) Clear   Leukocytes,UA 1+ (A) Negative   Protein,UA 1+ (A) Negative/Trace   Glucose, UA Negative Negative   Ketones, UA Negative Negative   RBC, UA Trace (A) Negative   Bilirubin, UA CANCELED    Urobilinogen, Ur 1.0 0.2 - 1.0 mg/dL   Nitrite, UA Negative Negative   Microscopic Examination See below:       Assessment & Plan:   Problem List Items Addressed This Visit       Cardiovascular and Mediastinum   Essential hypertension - Primary    Under good control on current regimen. Continue current regimen. Continue to monitor. Call with any concerns. Refills given. Labs drawn today.        Relevant Medications   atorvastatin (LIPITOR) 10 MG tablet   losartan (COZAAR) 50 MG tablet   spironolactone (ALDACTONE) 25 MG tablet   Other Relevant Orders   Microalbumin, Urine Waived  (Completed)   CBC with Differential/Platelet   Comprehensive metabolic panel   Urinalysis, Routine w reflex microscopic (Completed)   TSH   Aortic atherosclerosis (HCC)    Will keep BP and cholesterol under good control. Continue to monitor. Call with any concerns.       Relevant Medications   atorvastatin (LIPITOR) 10 MG tablet   losartan (COZAAR) 50 MG tablet   spironolactone (ALDACTONE) 25 MG tablet   Other Relevant Orders   CBC with Differential/Platelet   Comprehensive metabolic panel     Endocrine   IFG (impaired fasting glucose)    A1c improved at 6.0. Continue current regimen. Continue to monitor. Call with any concerns.       Relevant Orders   Microalbumin, Urine Waived (Completed)   Bayer DCA Hb A1c Waived (Completed)   CBC with Differential/Platelet   Comprehensive metabolic panel     Other   Pure hypercholesterolemia    Under good control on current regimen. Continue current regimen. Continue to monitor. Call with any concerns. Refills given. Labs drawn today.       Relevant Medications   atorvastatin (LIPITOR) 10 MG tablet   losartan (COZAAR) 50 MG tablet   spironolactone (ALDACTONE) 25 MG tablet   Other Relevant Orders   CBC with Differential/Platelet   Comprehensive metabolic panel   Lipid Panel w/o Chol/HDL Ratio   B12 deficiency    Labs drawn today. Await results. Treat as needed.       Relevant Orders   CBC with Differential/Platelet   Comprehensive metabolic panel   Anxiety    Under good control on current regimen. Continue current regimen. Continue to monitor. Call with any concerns. Refills given. Labs drawn today.       Relevant Medications   ALPRAZolam Duanne Moron)  0.5 MG tablet   Other Relevant Orders   CBC with Differential/Platelet   Comprehensive metabolic panel   Vitamin D deficiency    Under good control on current regimen. Continue current regimen. Continue to monitor. Call with any concerns. Refills given. Labs drawn today.        Relevant Orders   CBC with Differential/Platelet   Comprehensive metabolic panel   VITAMIN D 25 Hydroxy (Vit-D Deficiency, Fractures)   Gout    Under good control on current regimen. Continue current regimen. Continue to monitor. Call with any concerns. Refills given. Labs drawn today.       Relevant Orders   CBC with Differential/Platelet   Comprehensive metabolic panel   Uric acid   Other Visit Diagnoses     Rash       Will get her back into see dermatology. Appointment scheduled for this afternoon. Await their input.   Pyuria       Will check urine culture. Await results. Treat as needed.   Relevant Orders   Urine Culture        Follow up plan: Return in about 6 months (around 12/09/2022).

## 2022-06-08 NOTE — Progress Notes (Signed)
   Follow-Up Visit   Subjective  DIAHANNA BARBARO is a 77 y.o. female who presents for the following: Rash (Started Dec 5th after spreading out limbs on artificial Christmas tree, arms and hands broke out blood red and lasted 30 minutes but then it went away. After she showers she is red under both the arms, breast, and knees. Rash travels to other parts of the body. Rash also occurs when in contact with cold. No new body washes, shampoos, lotions, medications, surgery, or illness).  The following portions of the chart were reviewed this encounter and updated as appropriate:   Tobacco  Allergies  Meds  Problems  Med Hx  Surg Hx  Fam Hx     Review of Systems:  No other skin or systemic complaints except as noted in HPI or Assessment and Plan.  Objective  Well appearing patient in no apparent distress; mood and affect are within normal limits.  A focused examination was performed including the face and extremities. Relevant physical exam findings are noted in the Assessment and Plan.  Trunk, extremities Dermatographism. Photos show patches of erythema on the feet, legs, arms, chest, and neck.    Assessment & Plan  Idiopathic urticaria Trunk, extremities Hx of stress, photos consistent with urticaria - reviewed UA from today which did come back positive for WBC, her PCP ordered a culture, and if positive for bacteria urticaria may be related to infection.  Urticaria or hives is a pink to red patchy whelp- like rash of the skin that typically itches and it is the result of histamine release in the skin.   Hives may have multiple causes including stress, medications, infections, and systemic illness.  Sometimes there is a family history of chronic urticaria.   "Physical urticarias" may be caused by heat, sun, cold, vibration.   Insect bites can cause "papular urticaria". It is often difficult to find the cause of generalized hives.  Statistically, 70% of the time a cause of generalized  hives is not found.  Sometimes hives can spontaneously resolve. Other times hives can persist and when it does, and no cause is found, and it has been at least 6 weeks since started, it is called "chronic idiopathic urticaria". Antihistamines are the mainstay for treatment.  In severe cases Xolair injections may be used.  Start Allegra 180 mg po QD or Claritin may gradually increase up to 4 tabs po QD.  Consider Singulair in the future.   Purpura - Chronic; persistent and recurrent.  Treatable, but not curable. - Violaceous macules and patches - Benign - Related to trauma, age, sun damage and/or use of blood thinners, chronic use of topical and/or oral steroids - Observe - Can use OTC arnica containing moisturizer such as Dermend Bruise Formula if desired - Call for worsening or other concerns  Return for appointment as scheduled.  Luther Redo, CMA, am acting as scribe for Sarina Ser, MD . Documentation: I have reviewed the above documentation for accuracy and completeness, and I agree with the above.  Sarina Ser, MD

## 2022-06-08 NOTE — Patient Instructions (Signed)
Patient is scheduled for today at 2:45 PM at [facility] with Dr Nehemiah Massed. If patient has any questions regarding her appointment. Please reach out to their office directly at [phone].

## 2022-06-08 NOTE — Assessment & Plan Note (Signed)
Will keep BP and cholesterol under good control. Continue to monitor. Call with any concerns.

## 2022-06-08 NOTE — Assessment & Plan Note (Signed)
A1c improved at 6.0. Continue current regimen. Continue to monitor. Call with any concerns.

## 2022-06-08 NOTE — Patient Instructions (Addendum)
Urticaria or hives is a pink to red patchy whelp- like rash of the skin that typically itches and it is the result of histamine release in the skin.   Hives may have multiple causes including stress, medications, infections, and systemic illness.  Sometimes there is a family history of chronic urticaria.   "Physical urticarias" may be caused by heat, sun, cold, vibration.   Insect bites can cause "papular urticaria". It is often difficult to find the cause of generalized hives.  Statistically, 70% of the time a cause of generalized hives is not found.  Sometimes hives can spontaneously resolve. Other times hives can persist and when it does, and no cause is found, and it has been at least 6 weeks since started, it is called "chronic idiopathic urticaria". Antihistamines are the mainstay for treatment.  In severe cases Xolair injections may be used.  Start over the counter Allegra OR Claritin one tablet daily for a few days, if still breaking out may gradually increase by one tablet four times a day.   Consider Xolair injections if not resolving with antihistamine.   Due to recent changes in healthcare laws, you may see results of your pathology and/or laboratory studies on MyChart before the doctors have had a chance to review them. We understand that in some cases there may be results that are confusing or concerning to you. Please understand that not all results are received at the same time and often the doctors may need to interpret multiple results in order to provide you with the best plan of care or course of treatment. Therefore, we ask that you please give [place] 2 business days to thoroughly review all your results before contacting the office for clarification. Should we see a critical lab result, you will be contacted sooner.   If You Need Anything After Your Visit  If you have any questions or concerns for your doctor, please call our main line at [phone] and press option 4 to reach  your doctor's medical assistant. If no one answers, please leave a voicemail as directed and we will return your call as soon as possible. Messages left after 4 pm will be answered the following business day.   You may also send [place] a message via [place]. We typically respond to MyChart messages within 1-2 business days.  For prescription refills, please ask your pharmacy to contact our office. Our fax number is [phone].  If you have an urgent issue when the clinic is closed that cannot wait until the next business day, you can page your doctor at the number below.    Please note that while we do our best to be available for urgent issues outside of office hours, we are not available 24/7.   If you have an urgent issue and are unable to reach [place], you may choose to seek medical care at your doctor's office, retail clinic, urgent care center, or emergency room.  If you have a medical emergency, please immediately call 911 or go to the emergency department.  Pager Numbers  - Dr. Nehemiah Massed: [phone]  - Dr. Laurence Ferrari: [phone]  - Dr. Nicole Kindred: [phone]  In the event of inclement weather, please call our main line at [phone] for an update on the status of any delays or closures.  Dermatology Medication Tips: Please keep the boxes that topical medications come in in order to help keep track of the instructions about where and how to use these. Pharmacies typically print the medication instructions only  on the boxes and not directly on the medication tubes.   If your medication is too expensive, please contact our office at [phone] option 4 or send [place] a message through [place].   We are unable to tell what your co-pay for medications will be in advance as this is different depending on your insurance coverage. However, we may be able to find a substitute medication at lower cost or fill out paperwork to get insurance to cover a needed medication.   If a prior authorization  is required to get your medication covered by your insurance company, please allow [place] 1-2 business days to complete this process.  Drug prices often vary depending on where the prescription is filled and some pharmacies may offer cheaper prices.  The website www.goodrx.com contains coupons for medications through different pharmacies. The prices here do not account for what the cost may be with help from insurance (it may be cheaper with your insurance), but the website can give you the price if you did not use any insurance.  - You can print the associated coupon and take it with your prescription to the pharmacy.  - You may also stop by our office during regular business hours and pick up a GoodRx coupon card.  - If you need your prescription sent electronically to a different pharmacy, notify our office through [facility] or by phone at [phone] option 4.     Si Usted Necesita Algo Despus de Su Visita  Tambin puede enviarnos un mensaje a travs de Pharmacist, community. Por lo general respondemos a los mensajes de MyChart en el transcurso de 1 a 2 das hbiles.  Para renovar recetas, por favor pida a su farmacia que se ponga en contacto con nuestra oficina. Harland Dingwall de fax es [place] [phone].  Si tiene un asunto urgente cuando la clnica est cerrada y que no puede esperar hasta el siguiente da hbil, puede llamar/localizar a su doctor(a) al nmero que aparece a continuacin.   Por favor, tenga en cuenta que aunque hacemos todo lo posible para estar disponibles para asuntos urgentes fuera del horario de [place], no estamos disponibles las 24 horas del da, los 7 das de la [place].   Si tiene un problema urgente y no puede comunicarse con nosotros, puede optar por buscar atencin mdica  en el consultorio de su doctor(a), en una clnica privada, en un centro de atencin urgente o en una sala de emergencias.  Si tiene Engineering geologist, por favor llame inmediatamente al 911 o  vaya a la sala de emergencias.  Nmeros de bper  - Dr. Nehemiah Massed: [phone]  - Dra. Moye: [phone]  - Dra. Nicole Kindred: [phone]  En caso de inclemencias del [place], por favor llame a Johnsie Kindred principal al [phone] para una actualizacin sobre el [place] de cualquier retraso o cierre.  Consejos para la medicacin en dermatologa: Por favor, guarde las cajas en las que vienen los medicamentos de uso tpico para ayudarle a seguir las instrucciones sobre dnde y cmo usarlos. Las farmacias generalmente imprimen las instrucciones del medicamento slo en las cajas y no directamente en los tubos del [place].   Si su medicamento es muy caro, por favor, pngase en contacto con Zigmund [name] llamando al [phone] y presione la opcin 4 o envenos un mensaje a travs de Pharmacist, community.   No podemos decirle cul ser su copago por los medicamentos por adelantado ya que esto es diferente dependiendo de la cobertura de su seguro. Sin embargo, es posible  que podamos encontrar un medicamento sustituto a Electrical engineer un formulario para que el seguro cubra el medicamento que se considera necesario.   Si se requiere una autorizacin previa para que su compaa de seguros [place] su medicamento, por favor permtanos de 1 a 2 das hbiles para completar este proceso.  Los precios de los medicamentos varan con frecuencia dependiendo del Environmental consultant de dnde se surte la receta y alguna farmacias pueden ofrecer precios ms baratos.  El sitio web www.goodrx.com tiene cupones para medicamentos de Airline pilot. Los precios aqu no tienen en cuenta lo que podra costar con la ayuda del seguro (puede ser ms barato con su seguro), pero el sitio web puede darle el precio si no utiliz Research scientist (physical sciences).  - Puede imprimir el cupn correspondiente y llevarlo con su receta a la farmacia.  - Tambin puede pasar por nuestra oficina durante el horario de atencin regular y Charity fundraiser una tarjeta de cupones  de GoodRx.  - Si necesita que su receta se enve electrnicamente a una farmacia diferente, informe a nuestra oficina a travs de MyChart de [place] o por telfono llamando al [phone] y presione la opcin 4.

## 2022-06-09 ENCOUNTER — Telehealth: Payer: Self-pay

## 2022-06-09 LAB — CBC WITH DIFFERENTIAL/PLATELET
Basophils Absolute: 0 10*3/uL (ref 0.0–0.2)
Basos: 0 %
EOS (ABSOLUTE): 0.1 10*3/uL (ref 0.0–0.4)
Eos: 2 %
Hematocrit: 40.8 % (ref 34.0–46.6)
Hemoglobin: 13.6 g/dL (ref 11.1–15.9)
Immature Grans (Abs): 0 10*3/uL (ref 0.0–0.1)
Immature Granulocytes: 0 %
Lymphocytes Absolute: 1.8 10*3/uL (ref 0.7–3.1)
Lymphs: 34 %
MCH: 33.5 pg — ABNORMAL HIGH (ref 26.6–33.0)
MCHC: 33.3 g/dL (ref 31.5–35.7)
MCV: 101 fL — ABNORMAL HIGH (ref 79–97)
Monocytes Absolute: 0.7 10*3/uL (ref 0.1–0.9)
Monocytes: 13 %
Neutrophils Absolute: 2.8 10*3/uL (ref 1.4–7.0)
Neutrophils: 51 %
Platelets: 156 10*3/uL (ref 150–450)
RBC: 4.06 x10E6/uL (ref 3.77–5.28)
RDW: 12.4 % (ref 11.7–15.4)
WBC: 5.4 10*3/uL (ref 3.4–10.8)

## 2022-06-09 LAB — COMPREHENSIVE METABOLIC PANEL
ALT: 24 IU/L (ref 0–32)
AST: 25 IU/L (ref 0–40)
Albumin/Globulin Ratio: 1.8 (ref 1.2–2.2)
Albumin: 4.5 g/dL (ref 3.8–4.8)
Alkaline Phosphatase: 101 IU/L (ref 44–121)
BUN/Creatinine Ratio: 12 (ref 12–28)
BUN: 10 mg/dL (ref 8–27)
Bilirubin Total: 0.8 mg/dL (ref 0.0–1.2)
CO2: 20 mmol/L (ref 20–29)
Calcium: 9.7 mg/dL (ref 8.7–10.3)
Chloride: 103 mmol/L (ref 96–106)
Creatinine, Ser: 0.86 mg/dL (ref 0.57–1.00)
Globulin, Total: 2.5 g/dL (ref 1.5–4.5)
Glucose: 110 mg/dL — ABNORMAL HIGH (ref 70–99)
Potassium: 4.4 mmol/L (ref 3.5–5.2)
Sodium: 139 mmol/L (ref 134–144)
Total Protein: 7 g/dL (ref 6.0–8.5)
eGFR: 70 mL/min/{1.73_m2} (ref >59)

## 2022-06-09 LAB — TSH: TSH: 3.94 u[IU]/mL (ref 0.450–4.500)

## 2022-06-09 LAB — URIC ACID: Uric Acid: 6.2 mg/dL (ref 3.1–7.9)

## 2022-06-09 LAB — VITAMIN D 25 HYDROXY (VIT D DEFICIENCY, FRACTURES): Vit D, 25-Hydroxy: 25.6 ng/mL — ABNORMAL LOW (ref 30.0–100.0)

## 2022-06-09 LAB — LIPID PANEL W/O CHOL/HDL RATIO
Cholesterol, Total: 193 mg/dL (ref 100–199)
HDL: 69 mg/dL (ref >39)
LDL Chol Calc (NIH): 100 mg/dL — ABNORMAL HIGH (ref 0–99)
Triglycerides: 142 mg/dL (ref 0–149)
VLDL Cholesterol Cal: 24 mg/dL (ref 5–40)

## 2022-06-09 NOTE — Telephone Encounter (Signed)
Left voicemail that it's OK to take OTC antihistamine Allegra or Claritin up to 4 tablets if needed once a day, and not four times a day, and to return our call if she needs anything.

## 2022-06-12 ENCOUNTER — Other Ambulatory Visit: Payer: Self-pay | Admitting: Family Medicine

## 2022-06-12 LAB — URINE CULTURE

## 2022-06-12 MED ORDER — NITROFURANTOIN MONOHYD MACRO 100 MG PO CAPS: 100.0000 mg | ORAL_CAPSULE | Freq: Two times a day (BID) | ORAL | 0 refills | Status: DC

## 2022-06-13 ENCOUNTER — Encounter: Payer: Self-pay | Admitting: Dermatology

## 2022-06-17 DIAGNOSIS — M791 Myalgia, unspecified site: Secondary | ICD-10-CM | POA: Diagnosis not present

## 2022-06-17 DIAGNOSIS — M546 Pain in thoracic spine: Secondary | ICD-10-CM | POA: Diagnosis not present

## 2022-06-17 DIAGNOSIS — Z79891 Long term (current) use of opiate analgesic: Secondary | ICD-10-CM | POA: Diagnosis not present

## 2022-06-17 DIAGNOSIS — M47814 Spondylosis without myelopathy or radiculopathy, thoracic region: Secondary | ICD-10-CM | POA: Diagnosis not present

## 2022-06-17 DIAGNOSIS — M25552 Pain in left hip: Secondary | ICD-10-CM | POA: Diagnosis not present

## 2022-06-23 DIAGNOSIS — I700 Atherosclerosis of aorta: Secondary | ICD-10-CM | POA: Diagnosis not present

## 2022-06-23 DIAGNOSIS — E785 Hyperlipidemia, unspecified: Secondary | ICD-10-CM | POA: Diagnosis not present

## 2022-06-23 DIAGNOSIS — R000 Tachycardia, unspecified: Secondary | ICD-10-CM | POA: Diagnosis not present

## 2022-06-23 DIAGNOSIS — I10 Essential (primary) hypertension: Secondary | ICD-10-CM | POA: Diagnosis not present

## 2022-07-15 DIAGNOSIS — M546 Pain in thoracic spine: Secondary | ICD-10-CM | POA: Diagnosis not present

## 2022-07-15 DIAGNOSIS — Z79891 Long term (current) use of opiate analgesic: Secondary | ICD-10-CM | POA: Diagnosis not present

## 2022-07-15 DIAGNOSIS — M791 Myalgia, unspecified site: Secondary | ICD-10-CM | POA: Diagnosis not present

## 2022-07-15 DIAGNOSIS — M47814 Spondylosis without myelopathy or radiculopathy, thoracic region: Secondary | ICD-10-CM | POA: Diagnosis not present

## 2022-08-27 ENCOUNTER — Encounter: Payer: Self-pay | Admitting: Dermatology

## 2022-08-27 ENCOUNTER — Ambulatory Visit (INDEPENDENT_AMBULATORY_CARE_PROVIDER_SITE_OTHER): Payer: Medicare Other | Admitting: Dermatology

## 2022-08-27 VITALS — BP 120/69 | HR 75

## 2022-08-27 DIAGNOSIS — L820 Inflamed seborrheic keratosis: Secondary | ICD-10-CM

## 2022-08-27 DIAGNOSIS — X32XXXA Exposure to sunlight, initial encounter: Secondary | ICD-10-CM

## 2022-08-27 DIAGNOSIS — Z79891 Long term (current) use of opiate analgesic: Secondary | ICD-10-CM | POA: Diagnosis not present

## 2022-08-27 DIAGNOSIS — I8393 Asymptomatic varicose veins of bilateral lower extremities: Secondary | ICD-10-CM

## 2022-08-27 DIAGNOSIS — W908XXA Exposure to other nonionizing radiation, initial encounter: Secondary | ICD-10-CM | POA: Diagnosis not present

## 2022-08-27 DIAGNOSIS — L700 Acne vulgaris: Secondary | ICD-10-CM

## 2022-08-27 DIAGNOSIS — L920 Granuloma annulare: Secondary | ICD-10-CM

## 2022-08-27 DIAGNOSIS — L578 Other skin changes due to chronic exposure to nonionizing radiation: Secondary | ICD-10-CM | POA: Diagnosis not present

## 2022-08-27 DIAGNOSIS — M546 Pain in thoracic spine: Secondary | ICD-10-CM | POA: Diagnosis not present

## 2022-08-27 DIAGNOSIS — L821 Other seborrheic keratosis: Secondary | ICD-10-CM

## 2022-08-27 DIAGNOSIS — Z7189 Other specified counseling: Secondary | ICD-10-CM | POA: Diagnosis not present

## 2022-08-27 DIAGNOSIS — L988 Other specified disorders of the skin and subcutaneous tissue: Secondary | ICD-10-CM | POA: Diagnosis not present

## 2022-08-27 DIAGNOSIS — Z79899 Other long term (current) drug therapy: Secondary | ICD-10-CM

## 2022-08-27 DIAGNOSIS — M47814 Spondylosis without myelopathy or radiculopathy, thoracic region: Secondary | ICD-10-CM | POA: Diagnosis not present

## 2022-08-27 DIAGNOSIS — M791 Myalgia, unspecified site: Secondary | ICD-10-CM | POA: Diagnosis not present

## 2022-08-27 MED ORDER — OPZELURA 1.5 % EX CREA: TOPICAL_CREAM | CUTANEOUS | 1 refills | Status: DC

## 2022-08-27 NOTE — Patient Instructions (Addendum)
For spot at left nose  Can use Aklief sample - apply a small amount  topically to affected area nightly as needed. Can use followed by moisturizer. Can cause drying and peeling  Topical retinoid medications like tretinoin/Retin-A, adapalene/Differin, tazarotene/Fabior, and Epiduo/Epiduo Forte can cause dryness and irritation when first started. Only apply a pea-sized amount to the entire affected area. Avoid applying it around the eyes, edges of mouth and creases at the nose. If you experience irritation, use a good moisturizer first and/or apply the medicine less often. If you are doing well with the medicine, you can increase how often you use it until you are applying every night. Be careful with sun protection while using this medication as it can make you sensitive to the sun. This medicine should not be used by pregnant women.        Seborrheic Keratosis  What causes seborrheic keratoses? Seborrheic keratoses are harmless, common skin growths that first appear during adult life.  As time goes by, more growths appear.  Some people may develop a large number of them.  Seborrheic keratoses appear on both covered and uncovered body parts.  They are not caused by sunlight.  The tendency to develop seborrheic keratoses can be inherited.  They vary in color from skin-colored to gray, brown, or even black.  They can be either smooth or have a rough, warty surface.   Seborrheic keratoses are superficial and look as if they were stuck on the skin.  Under the microscope this type of keratosis looks like layers upon layers of skin.  That is why at times the top layer may seem to fall off, but the rest of the growth remains and re-grows.    Treatment Seborrheic keratoses do not need to be treated, but can easily be removed in the office.  Seborrheic keratoses often cause symptoms when they rub on clothing or jewelry.  Lesions can be in the way of shaving.  If they become inflamed, they can cause  itching, soreness, or burning.  Removal of a seborrheic keratosis can be accomplished by freezing, burning, or surgery. If any spot bleeds, scabs, or grows rapidly, please return to have it checked, as these can be an indication of a skin cancer.       Due to recent changes in healthcare laws, you may see results of your pathology and/or laboratory studies on MyChart before the doctors have had a chance to review them. We understand that in some cases there may be results that are confusing or concerning to you. Please understand that not all results are received at the same time and often the doctors may need to interpret multiple results in order to provide you with the best plan of care or course of treatment. Therefore, we ask that you please give [place] 2 business days to thoroughly review all your results before contacting the office for clarification. Should we see a critical lab result, you will be contacted sooner.   If You Need Anything After Your Visit  If you have any questions or concerns for your doctor, please call our main line at [phone] and press option 4 to reach your doctor's medical assistant. If no one answers, please leave a voicemail as directed and we will return your call as soon as possible. Messages left after 4 pm will be answered the following business day.   You may also send [place] a message via MyChart. We typically respond to MyChart messages within 1-2 business days.  For prescription refills, please ask your pharmacy to contact our office. Our fax number is [phone].  If you have an urgent issue when the clinic is closed that cannot wait until the next business day, you can page your doctor at the number below.    Please note that while we do our best to be available for urgent issues outside of office hours, we are not available 24/7.   If you have an urgent issue and are unable to reach [place], you may choose to seek medical care at your doctor's office,  retail clinic, urgent care center, or emergency room.  If you have a medical emergency, please immediately call 911 or go to the emergency department.  Pager Numbers  - Dr. Gwen Pounds: [phone]  - Dr. Neale Burly: [phone]  - Dr. Roseanne Reno: [phone]  In the event of inclement weather, please call our main line at [phone] for an update on the status of any delays or closures.  Dermatology Medication Tips: Please keep the boxes that topical medications come in in order to help keep track of the instructions about where and how to use these. Pharmacies typically print the medication instructions only on the boxes and not directly on the medication tubes.   If your medication is too expensive, please contact our office at [phone] option 4 or send [place] a message through MyChart.   We are unable to tell what your co-pay for medications will be in advance as this is different depending on your insurance coverage. However, we may be able to find a substitute medication at lower cost or fill out paperwork to get insurance to cover a needed medication.   If a prior authorization is required to get your medication covered by your insurance company, please allow [place] 1-2 business days to complete this process.  Drug prices often vary depending on where the prescription is filled and some pharmacies may offer cheaper prices.  The website www.goodrx.com contains coupons for medications through different pharmacies. The prices here do not account for what the cost may be with help from insurance (it may be cheaper with your insurance), but the website can give you the price if you did not use any insurance.  - You can print the associated coupon and take it with your prescription to the pharmacy.  - You may also stop by our office during regular business hours and pick up a GoodRx coupon card.  - If you need your prescription sent electronically to a different pharmacy, notify our office through  [facility] or by phone at [phone] option 4.     Si Usted Necesita Algo Despus de Su Visita  Tambin puede enviarnos un mensaje a travs de Clinical cytogeneticist. Por lo general respondemos a los mensajes de MyChart en el transcurso de 1 a 2 das hbiles.  Para renovar recetas, por favor pida a su farmacia que se ponga en contacto con nuestra oficina. Annie Sable de fax es [place] [phone].  Si tiene un asunto urgente cuando la clnica est cerrada y que no puede esperar hasta el siguiente da hbil, puede llamar/localizar a su doctor(a) al nmero que aparece a continuacin.   Por favor, tenga en cuenta que aunque hacemos todo lo posible para estar disponibles para asuntos urgentes fuera del horario de [place], no estamos disponibles las 24 horas del da, los 7 [address]  [address] de la [place].   Si tiene un problema urgente y no puede comunicarse con nosotros, puede optar por buscar atencin mdica  en  el consultorio de su doctor(a), en una clnica privada, en un centro de atencin urgente o en una sala de emergencias.  Si tiene Engineer, drilling, por favor llame inmediatamente al 911 o vaya a la sala de emergencias.  Nmeros de bper  - Dr. Gwen Pounds: [phone]  - Dra. Moye: [phone]  - Dra. Roseanne Reno: [phone]  En caso de inclemencias del [place], por favor llame a Lacy Duverney principal al [phone] para una actualizacin sobre el [place] de cualquier retraso o cierre.  Consejos para la medicacin en dermatologa: Por favor, guarde las cajas en las que vienen los medicamentos de uso tpico para ayudarle a seguir las instrucciones sobre dnde y cmo usarlos. Las farmacias generalmente imprimen las instrucciones del medicamento slo en las cajas y no directamente en los tubos del [place].   Si su medicamento es muy caro, por favor, pngase en contacto con Rolm Gala llamando al [phone] y presione la opcin 4 o envenos un mensaje a travs de Clinical cytogeneticist.   No podemos  decirle cul ser su copago por los medicamentos por adelantado ya que esto es diferente dependiendo de la cobertura de su seguro. Sin embargo, es posible que podamos encontrar un medicamento sustituto a Audiological scientist un formulario para que el seguro cubra el medicamento que se considera necesario.   Si se requiere una autorizacin previa para que su compaa de seguros [place] su medicamento, por favor permtanos de 1 a 2 das hbiles para completar [address]  Los precios de los medicamentos varan con frecuencia dependiendo del Environmental consultant de dnde se surte la receta y alguna farmacias pueden ofrecer precios ms baratos.  El sitio web www.goodrx.com tiene cupones para medicamentos de Health and safety inspector. Los precios aqu no tienen en cuenta lo que podra costar con la ayuda del seguro (puede ser ms barato con su seguro), pero el sitio web puede darle el precio si no utiliz Tourist information centre manager.  - Puede imprimir el cupn correspondiente y llevarlo con su receta a la farmacia.  - Tambin puede pasar por nuestra oficina durante el horario de atencin regular y Education officer, museum una tarjeta de cupones de GoodRx.  - Si necesita que su receta se enve electrnicamente a una farmacia diferente, informe a nuestra oficina a travs de MyChart de [place] o por telfono llamando al [phone] y presione la opcin 4.

## 2022-08-27 NOTE — Progress Notes (Signed)
Follow-Up Visit   Subjective  Tamara Fleming is a 77 y.o. female who presents for the following: Patient here for 6 month ak and isk follow up. Reports a spot at left nose that occasionally picks at. Also some itchy areas at arms and legs.  The patient has spots, moles and lesions to be evaluated, some may be new or changing and the patient may have concern these could be cancer.  The following portions of the chart were reviewed this encounter and updated as appropriate: medications, allergies, medical history  Review of Systems:  No other skin or systemic complaints except as noted in HPI or Assessment and Plan.  Objective  Well appearing patient in no apparent distress; mood and affect are within normal limits. A focused examination was performed of the following areas: Hands, arms, legs, face,   Relevant exam findings are noted in the Assessment and Plan.  left lower leg x 1, left chest x 1 (2) Erythematous stuck-on, waxy papule or plaque   Assessment & Plan   Closed Comedone Exam: at left nose Treatment Plan: Benign - appearing.  Sample of Aklief cream - apply thin layer topically to aa qhs.   Elongated Lacerated earpiercing sites Exam : Elongated lacerated earpiercing sites  Treatment Plan: Discussed surgical repair to fix Discussed follow up with ENT if desired  Granuloma annulare B/L leg Chronic and persistent condition with duration or expected duration over one year. Condition is symptomatic / bothersome to patient. Not to goal. Continue Dapsone 7.5% gel QD.  Did not have samples of Opzelura,  Start Opzelura cream - apply topically to affected areas daily. Rx sent to [facility]   Patient advised to send mychart if not covered.   Benign-appearing.  Observation.  Call clinic for new or changing lesions.  Recommend daily use of broad spectrum spf 30+ sunscreen to sun-exposed areas.  Inflamed seborrheic keratosis (2) left lower leg x 1, left chest x  1  Symptomatic, irritating, patient would like treated.  Destruction of lesion - left lower leg x 1, left chest x 1 Complexity: simple   Destruction method: cryotherapy   Informed consent: discussed and consent obtained   Timeout:  patient name, date of birth, surgical site, and procedure verified Lesion destroyed using liquid nitrogen: Yes   Region frozen until ice ball extended beyond lesion: Yes   Outcome: patient tolerated procedure well with no complications   Post-procedure details: wound care instructions given    Granuloma annulare  Related Medications Dapsone 7.5 % GEL Apply to legs once a day  Ruxolitinib Phosphate (OPZELURA) 1.5 % CREA Apply topically to affected areas qd for Granuloma annulare   Indentation at right cheek Hx of bx by Dr. Gertie Baron  which was normal  No history of trauma  > than 30 years ago Recommend no treatment Discussed face lift   SEBORRHEIC KERATOSIS At legs, arms  - Stuck-on, waxy, tan-brown papules and/or plaques  - Benign-appearing - Discussed benign etiology and prognosis. - Observe - Call for any changes  Varicose Veins/Spider Veins - Dilated blue, purple or red veins at the lower extremities - Reassured - Smaller vessels can be treated by sclerotherapy (a procedure to inject a medicine into the veins to make them disappear) if desired, but the treatment is not covered by insurance. Larger vessels may be covered if symptomatic and we would refer to vascular surgeon if treatment desired.  ACTINIC DAMAGE - chronic, secondary to cumulative UV radiation exposure/sun exposure over time -  diffuse scaly erythematous macules with underlying dyspigmentation - Recommend daily broad spectrum sunscreen SPF 30+ to sun-exposed areas, reapply every 2 hours as needed.  - Recommend staying in the shade or wearing long sleeves, sun glasses (UVA+UVB protection) and wide brim hats (4-inch brim around the entire circumference of the hat). - Call  for new or changing lesions.  Return for tbse in november .  IAsher Muir, CMA, am acting as scribe for Armida Sans, MD.  Documentation: I have reviewed the above documentation for accuracy and completeness, and I agree with the above.  Armida Sans, MD

## 2022-09-08 ENCOUNTER — Encounter: Payer: Self-pay | Admitting: Dermatology

## 2022-09-10 DIAGNOSIS — R0789 Other chest pain: Secondary | ICD-10-CM | POA: Diagnosis not present

## 2022-09-11 ENCOUNTER — Other Ambulatory Visit: Payer: Self-pay | Admitting: Physician Assistant

## 2022-09-11 DIAGNOSIS — R9431 Abnormal electrocardiogram [ECG] [EKG]: Secondary | ICD-10-CM

## 2022-09-11 DIAGNOSIS — I10 Essential (primary) hypertension: Secondary | ICD-10-CM

## 2022-09-11 DIAGNOSIS — R0609 Other forms of dyspnea: Secondary | ICD-10-CM

## 2022-09-11 DIAGNOSIS — R079 Chest pain, unspecified: Secondary | ICD-10-CM

## 2022-09-15 ENCOUNTER — Encounter (HOSPITAL_COMMUNITY): Payer: Self-pay

## 2022-09-15 ENCOUNTER — Telehealth (HOSPITAL_COMMUNITY): Payer: Self-pay | Admitting: Emergency Medicine

## 2022-09-15 DIAGNOSIS — R079 Chest pain, unspecified: Secondary | ICD-10-CM

## 2022-09-15 MED ORDER — METOPROLOL TARTRATE 100 MG PO TABS: 100.0000 mg | ORAL_TABLET | Freq: Once | ORAL | 0 refills | Status: DC

## 2022-09-15 NOTE — Telephone Encounter (Signed)
Reaching out to patient to offer assistance regarding upcoming cardiac imaging study; pt verbalizes understanding of appt date/time, parking situation and where to check in, pre-test NPO status and medications ordered, and verified current allergies; name and call back number provided for further questions should they arise Rockwell Alexandria RN Navigator Cardiac Imaging [facility] [phone] office [phone] cell   100mg  metoprolol sent to pharm of file

## 2022-09-16 ENCOUNTER — Ambulatory Visit
Admission: RE | Admit: 2022-09-16 | Discharge: 2022-09-16 | Disposition: A | Discharge status: Home / Self Care | Payer: Medicare Other | Source: Ambulatory Visit | Attending: Physician Assistant | Admitting: Physician Assistant

## 2022-09-16 DIAGNOSIS — R0609 Other forms of dyspnea: Secondary | ICD-10-CM | POA: Diagnosis not present

## 2022-09-16 DIAGNOSIS — I10 Essential (primary) hypertension: Secondary | ICD-10-CM | POA: Diagnosis not present

## 2022-09-16 DIAGNOSIS — R9431 Abnormal electrocardiogram [ECG] [EKG]: Secondary | ICD-10-CM | POA: Diagnosis not present

## 2022-09-16 DIAGNOSIS — R079 Chest pain, unspecified: Secondary | ICD-10-CM | POA: Diagnosis not present

## 2022-09-16 MED ORDER — NITROGLYCERIN 0.4 MG SL SUBL
0.8000 mg | SUBLINGUAL_TABLET | Freq: Once | SUBLINGUAL | Status: AC
  Administered 2022-09-16: 0.8 mg via SUBLINGUAL
  Filled 2022-09-16: qty 25

## 2022-09-16 MED ORDER — IOHEXOL 350 MG/ML SOLN
80.0000 mL | Freq: Once | INTRAVENOUS | Status: AC | PRN
  Administered 2022-09-16: 80 mL via INTRAVENOUS

## 2022-09-16 NOTE — Progress Notes (Signed)
Patient tolerated procedure well. Ambulate w/o difficulty. Denies any lightheadedness or being dizzy. Pt denies any pain at this time. Sitting in chair, pt is encouraged to drink additional water throughout the day and reason explained to patient. Patient verbalized understanding and all questions answered. ABC intact. No further needs at this time. Discharge from procedure area w/o issues.  

## 2022-09-21 LAB — POCT I-STAT CREATININE: Creatinine, Ser: 0.8 mg/dL (ref 0.44–1.00)

## 2022-09-24 DIAGNOSIS — Z79891 Long term (current) use of opiate analgesic: Secondary | ICD-10-CM | POA: Diagnosis not present

## 2022-09-24 DIAGNOSIS — M791 Myalgia, unspecified site: Secondary | ICD-10-CM | POA: Diagnosis not present

## 2022-09-24 DIAGNOSIS — M47814 Spondylosis without myelopathy or radiculopathy, thoracic region: Secondary | ICD-10-CM | POA: Diagnosis not present

## 2022-09-24 DIAGNOSIS — M546 Pain in thoracic spine: Secondary | ICD-10-CM | POA: Diagnosis not present

## 2022-10-05 ENCOUNTER — Ambulatory Visit: Admission: RE | Admit: 2022-10-05 | Payer: Medicare Other | Source: Ambulatory Visit

## 2022-10-05 DIAGNOSIS — R0602 Shortness of breath: Secondary | ICD-10-CM | POA: Diagnosis not present

## 2022-10-15 ENCOUNTER — Inpatient Hospital Stay: Admission: RE | Admit: 2022-10-15 | Payer: Medicare Other | Source: Ambulatory Visit

## 2022-10-22 DIAGNOSIS — M6281 Muscle weakness (generalized): Secondary | ICD-10-CM | POA: Diagnosis not present

## 2022-10-22 DIAGNOSIS — M542 Cervicalgia: Secondary | ICD-10-CM | POA: Diagnosis not present

## 2022-10-22 DIAGNOSIS — J301 Allergic rhinitis due to pollen: Secondary | ICD-10-CM | POA: Diagnosis not present

## 2022-10-22 DIAGNOSIS — Z79891 Long term (current) use of opiate analgesic: Secondary | ICD-10-CM | POA: Diagnosis not present

## 2022-10-22 DIAGNOSIS — K219 Gastro-esophageal reflux disease without esophagitis: Secondary | ICD-10-CM | POA: Diagnosis not present

## 2022-10-22 DIAGNOSIS — M546 Pain in thoracic spine: Secondary | ICD-10-CM | POA: Diagnosis not present

## 2022-10-22 DIAGNOSIS — M791 Myalgia, unspecified site: Secondary | ICD-10-CM | POA: Diagnosis not present

## 2022-10-22 DIAGNOSIS — G8929 Other chronic pain: Secondary | ICD-10-CM | POA: Diagnosis not present

## 2022-10-22 DIAGNOSIS — Z7689 Persons encountering health services in other specified circumstances: Secondary | ICD-10-CM | POA: Diagnosis not present

## 2022-10-22 DIAGNOSIS — M47814 Spondylosis without myelopathy or radiculopathy, thoracic region: Secondary | ICD-10-CM | POA: Diagnosis not present

## 2022-10-30 ENCOUNTER — Ambulatory Visit (INDEPENDENT_AMBULATORY_CARE_PROVIDER_SITE_OTHER): Payer: Medicare Other | Admitting: Family Medicine

## 2022-10-30 ENCOUNTER — Encounter: Payer: Self-pay | Admitting: Family Medicine

## 2022-10-30 VITALS — BP 133/70 | HR 66 | Temp 98.3°F | Wt 184.0 lb

## 2022-10-30 DIAGNOSIS — M791 Myalgia, unspecified site: Secondary | ICD-10-CM | POA: Diagnosis not present

## 2022-10-30 DIAGNOSIS — G8929 Other chronic pain: Secondary | ICD-10-CM | POA: Diagnosis not present

## 2022-10-30 DIAGNOSIS — M25512 Pain in left shoulder: Secondary | ICD-10-CM | POA: Diagnosis not present

## 2022-10-30 DIAGNOSIS — E559 Vitamin D deficiency, unspecified: Secondary | ICD-10-CM

## 2022-10-30 DIAGNOSIS — E538 Deficiency of other specified B group vitamins: Secondary | ICD-10-CM

## 2022-10-30 MED ORDER — PREDNISONE 10 MG PO TABS: ORAL_TABLET | ORAL | 0 refills | Status: DC

## 2022-10-30 MED ORDER — ALPRAZOLAM 0.5 MG PO TABS: ORAL_TABLET | ORAL | 0 refills | Status: AC

## 2022-10-30 MED ORDER — CYANOCOBALAMIN 1000 MCG/ML IJ SOLN
1000.0000 ug | Freq: Once | INTRAMUSCULAR | Status: AC
Start: 2022-10-30 — End: 2022-10-30
  Administered 2022-10-30: 1000 ug via INTRAMUSCULAR

## 2022-10-30 MED ORDER — TRIAMCINOLONE ACETONIDE 40 MG/ML IJ SUSP
40.0000 mg | Freq: Once | INTRAMUSCULAR | Status: AC
Start: 2022-10-30 — End: 2022-10-30
  Administered 2022-10-30: 40 mg via INTRAMUSCULAR

## 2022-10-30 NOTE — Progress Notes (Signed)
BP 133/70   Pulse 66   Temp 98.3 F (36.8 C) (Oral)   Wt 184 lb (83.5 kg)   LMP  (LMP Unknown)   SpO2 98%   BMI 30.62 kg/m    Subjective:    Patient ID: Tamara Fleming, female    DOB: 11-Jul-1945, 78 y.o.   MRN: [id number]  HPI: Tamara Fleming is a 77 y.o. female  Chief Complaint  Patient presents with   Muscle Pain    In left shoulder pain has been going for several weeks with no relief   ARTHRALGIAS / JOINT ACHES Duration: few weeks Pain: yes Symmetric: no Severity: severe Quality: aching and sore Frequency: constant Context:  worse Decreased function/range of motion: no  Erythema: no Swelling: no Heat or warmth: no Morning stiffness: no Relief with NSAIDs?: no Treatments attempted:  rest, ice, heat, APAP, ibuprofen, and aleve  Involved Joints:     Hands: yes bilateral    Wrists: no      Elbows: no     Shoulders: yes  L>R    Back: yes     Hips: yes bilateral R>L    Knees: yes bilateral    Ankles: no     Feet: no   Relevant past medical, surgical, family and social history reviewed and updated as indicated. Interim medical history since our last visit reviewed. Allergies and medications reviewed and updated.  Review of Systems  Constitutional: Negative.   Respiratory: Negative.    Cardiovascular: Negative.   Musculoskeletal:  Positive for arthralgias and myalgias. Negative for back pain, gait problem, joint swelling, neck pain and neck stiffness.  Skin: Negative.   Neurological: Negative.   Psychiatric/Behavioral: Negative.      Per HPI unless specifically indicated above     Objective:    BP 133/70   Pulse 66   Temp 98.3 F (36.8 C) (Oral)   Wt 184 lb (83.5 kg)   LMP  (LMP Unknown)   SpO2 98%   BMI 30.62 kg/m   Wt Readings from Last 3 Encounters:  10/30/22 184 lb (83.5 kg)  06/08/22 187 lb 9.6 oz (85.1 kg)  05/14/22 185 lb 12.8 oz (84.3 kg)    Physical Exam Vitals and nursing note reviewed.  Constitutional:      General: She is  not in acute distress.    Appearance: Normal appearance. She is not ill-appearing, toxic-appearing or diaphoretic.  HENT:     Head: Normocephalic and atraumatic.     Right Ear: External ear normal.     Left Ear: External ear normal.     Nose: Nose normal.     Mouth/Throat:     Mouth: Mucous membranes are moist.     Pharynx: Oropharynx is clear.  Eyes:     General: No scleral icterus.       Right eye: No discharge.        Left eye: No discharge.     Extraocular Movements: Extraocular movements intact.     Conjunctiva/sclera: Conjunctivae normal.     Pupils: Pupils are equal, round, and reactive to light.  Cardiovascular:     Rate and Rhythm: Normal rate and regular rhythm.     Pulses: Normal pulses.     Heart sounds: Normal heart sounds. No murmur heard.    No friction rub. No gallop.  Pulmonary:     Effort: Pulmonary effort is normal. No respiratory distress.     Breath sounds: Normal breath sounds. No stridor. No wheezing, rhonchi  or rales.  Chest:     Chest wall: No tenderness.  Musculoskeletal:        General: Normal range of motion.     Cervical back: Normal range of motion and neck supple.  Skin:    General: Skin is warm and dry.     Capillary Refill: Capillary refill takes less than 2 seconds.     Coloration: Skin is not jaundiced or pale.     Findings: No bruising, erythema, lesion or rash.  Neurological:     General: No focal deficit present.     Mental Status: She is alert and oriented to person, place, and time. Mental status is at baseline.  Psychiatric:        Mood and Affect: Mood normal.        Behavior: Behavior normal.        Thought Content: Thought content normal.        Judgment: Judgment normal.     Results for orders placed or performed during the hospital encounter of 09/16/22  I-STAT creatinine  Result Value Ref Range   Creatinine, Ser 0.80 0.44 - 1.00 mg/dL      Assessment & Plan:   Problem List Items Addressed This Visit       Other    B12 deficiency    B12 shot given today.       Vitamin D deficiency    Rechecking labs today. Await results. Treat as needed.       Relevant Orders   VITAMIN D 25 Hydroxy (Vit-D Deficiency, Fractures)   Other Visit Diagnoses     Myalgia    -  Primary   Will check labs. Await results. Hold atorvastatin for 2 weeks to see how she feels off it.   Relevant Orders   Lyme Disease Serology w/Reflex   Ehrlichia Antibody Panel   Spotted Fever Group Antibodies   Sed Rate (ESR)   Comprehensive metabolic panel   CBC with Differential/Platelet   Chronic left shoulder pain       Will check labs and x-rays. Treat with prednisone taper. Follow up in about 4 weeks. Call with any concerns.   Relevant Medications   celecoxib (CELEBREX) 100 MG capsule   predniSONE (DELTASONE) 10 MG tablet   triamcinolone acetonide (KENALOG-40) injection 40 mg (Completed)   Other Relevant Orders   DG Shoulder Left   DG Cervical Spine Complete        Follow up plan: Return in about 4 weeks (around 11/27/2022).   > 25 minutes spent with patient today

## 2022-10-30 NOTE — Assessment & Plan Note (Signed)
B12 shot given today  

## 2022-10-30 NOTE — Assessment & Plan Note (Signed)
Rechecking labs today. Await results. Treat as needed.  °

## 2022-11-02 ENCOUNTER — Ambulatory Visit
Admission: RE | Admit: 2022-11-02 | Discharge: 2022-11-02 | Disposition: A | Discharge status: Home / Self Care | Payer: Medicare Other | Source: Home / Self Care | Attending: Family Medicine | Admitting: Family Medicine

## 2022-11-02 ENCOUNTER — Ambulatory Visit
Admission: RE | Admit: 2022-11-02 | Discharge: 2022-11-02 | Disposition: A | Discharge status: Home / Self Care | Payer: Medicare Other | Source: Ambulatory Visit | Attending: Family Medicine | Admitting: Family Medicine

## 2022-11-02 DIAGNOSIS — G8929 Other chronic pain: Secondary | ICD-10-CM | POA: Diagnosis not present

## 2022-11-02 DIAGNOSIS — M47812 Spondylosis without myelopathy or radiculopathy, cervical region: Secondary | ICD-10-CM | POA: Diagnosis not present

## 2022-11-02 DIAGNOSIS — M25512 Pain in left shoulder: Secondary | ICD-10-CM | POA: Diagnosis not present

## 2022-11-02 DIAGNOSIS — M19012 Primary osteoarthritis, left shoulder: Secondary | ICD-10-CM | POA: Diagnosis not present

## 2022-11-02 DIAGNOSIS — M79603 Pain in arm, unspecified: Secondary | ICD-10-CM | POA: Diagnosis not present

## 2022-11-07 ENCOUNTER — Other Ambulatory Visit: Payer: Self-pay | Admitting: Family Medicine

## 2022-11-07 MED ORDER — VITAMIN B-12 1000 MCG PO TABS
1000.0000 ug | ORAL_TABLET | Freq: Every day | ORAL | 3 refills | Status: DC
Start: 2022-11-07 — End: 2023-01-12

## 2022-11-23 DIAGNOSIS — J301 Allergic rhinitis due to pollen: Secondary | ICD-10-CM | POA: Diagnosis not present

## 2022-11-23 DIAGNOSIS — R0609 Other forms of dyspnea: Secondary | ICD-10-CM | POA: Diagnosis not present

## 2022-11-23 DIAGNOSIS — K219 Gastro-esophageal reflux disease without esophagitis: Secondary | ICD-10-CM | POA: Diagnosis not present

## 2022-11-23 DIAGNOSIS — M542 Cervicalgia: Secondary | ICD-10-CM | POA: Diagnosis not present

## 2022-11-24 ENCOUNTER — Other Ambulatory Visit: Payer: Self-pay | Admitting: Family Medicine

## 2022-11-24 DIAGNOSIS — M791 Myalgia, unspecified site: Secondary | ICD-10-CM | POA: Diagnosis not present

## 2022-11-24 DIAGNOSIS — M47814 Spondylosis without myelopathy or radiculopathy, thoracic region: Secondary | ICD-10-CM | POA: Diagnosis not present

## 2022-11-24 DIAGNOSIS — M542 Cervicalgia: Secondary | ICD-10-CM | POA: Diagnosis not present

## 2022-11-24 DIAGNOSIS — M546 Pain in thoracic spine: Secondary | ICD-10-CM | POA: Diagnosis not present

## 2022-11-24 DIAGNOSIS — Z79891 Long term (current) use of opiate analgesic: Secondary | ICD-10-CM | POA: Diagnosis not present

## 2022-11-24 DIAGNOSIS — G8929 Other chronic pain: Secondary | ICD-10-CM | POA: Diagnosis not present

## 2022-11-25 NOTE — Telephone Encounter (Signed)
Requested Prescriptions  Pending Prescriptions Disp Refills   spironolactone (ALDACTONE) 25 MG tablet [Pharmacy Med Name: SPIRONOLACTONE 25 MG TAB] 90 tablet 0    Sig: TAKE 1 TABLET BY MOUTH ONCE DAILY     Cardiovascular: Diuretics - Aldosterone Antagonist Passed - 11/24/2022  3:52 PM      Passed - Cr in normal range and within 180 days    Creatinine  Date Value Ref Range Status  09/05/2012 0.74 0.60 - 1.30 mg/dL Final   Creat  Date Value Ref Range Status  02/26/2012 0.75 0.50 - 1.10 mg/dL Final   Creatinine, Ser  Date Value Ref Range Status  10/30/2022 0.89 0.57 - 1.00 mg/dL Final         Passed - K in normal range and within 180 days    Potassium  Date Value Ref Range Status  10/30/2022 4.6 3.5 - 5.2 mmol/L Final  09/05/2012 3.9 3.5 - 5.1 mmol/L Final         Passed - Na in normal range and within 180 days    Sodium  Date Value Ref Range Status  10/30/2022 136 134 - 144 mmol/L Final  09/05/2012 139 136 - 145 mmol/L Final         Passed - eGFR is 30 or above and within 180 days    EGFR (African American)  Date Value Ref Range Status  09/05/2012 >60  Final   GFR calc Af Amer  Date Value Ref Range Status  12/28/2019 85 >59 mL/min/1.73 Final    Comment:    **[facility] currently reports eGFR in compliance with the current**   recommendations of the [employer]. [facility] will   update reporting as new guidelines are published from the NKF-ASN   Task force.    EGFR (Non-African Amer.)  Date Value Ref Range Status  09/05/2012 >60  Final    Comment:    eGFR values <11mL/min/1.73 m2 may be an indication of chronic kidney disease (CKD). Calculated eGFR is useful in patients with stable renal function. The eGFR calculation will not be reliable in acutely ill patients when serum creatinine is changing rapidly. It is not useful in  patients on dialysis. The eGFR calculation may not be applicable to patients at the low and high extremes of body sizes,  pregnant women, and vegetarians.    GFR, Estimated  Date Value Ref Range Status  10/03/2021 55 (L) >60 mL/min Final    Comment:    (NOTE) Calculated using the CKD-EPI Creatinine Equation (2021)    GFR  Date Value Ref Range Status  06/29/2012 69.22 >60.00 mL/min Final   eGFR  Date Value Ref Range Status  10/30/2022 67 >59 mL/min/1.73 Final         Passed - Last BP in normal range    BP Readings from Last 1 Encounters:  10/30/22 133/70         Passed - Valid encounter within last 6 months    Recent Outpatient Visits           3 weeks ago Myalgia   [facility] [place], [place], DO   5 months ago Essential hypertension   [facility] [place], [place], DO   6 months ago Bruise   [facility] [place], DO   8 months ago Itching   [place] [place], [place] P, DO   9 months ago Acute non-recurrent maxillary sinusitis   [facility] [place], Oralia Rud, DO       Future Appointments             In 6 days Dorcas Carrow, DO [place] [facility]   In 3 weeks Laural Benes, Oralia Rud, DO [place] [facility]   In 3 months Deirdre Evener, MD [facility]

## 2022-11-30 DIAGNOSIS — H2513 Age-related nuclear cataract, bilateral: Secondary | ICD-10-CM | POA: Diagnosis not present

## 2022-11-30 DIAGNOSIS — H43813 Vitreous degeneration, bilateral: Secondary | ICD-10-CM | POA: Diagnosis not present

## 2022-12-01 ENCOUNTER — Encounter: Payer: Self-pay | Admitting: Family Medicine

## 2022-12-01 ENCOUNTER — Ambulatory Visit (INDEPENDENT_AMBULATORY_CARE_PROVIDER_SITE_OTHER): Payer: Medicare Other | Admitting: Family Medicine

## 2022-12-01 VITALS — BP 133/69 | HR 71 | Ht 65.0 in | Wt 175.4 lb

## 2022-12-01 DIAGNOSIS — D692 Other nonthrombocytopenic purpura: Secondary | ICD-10-CM

## 2022-12-01 DIAGNOSIS — E538 Deficiency of other specified B group vitamins: Secondary | ICD-10-CM

## 2022-12-01 DIAGNOSIS — T466X5A Adverse effect of antihyperlipidemic and antiarteriosclerotic drugs, initial encounter: Secondary | ICD-10-CM

## 2022-12-01 DIAGNOSIS — E7800 Pure hypercholesterolemia, unspecified: Secondary | ICD-10-CM | POA: Diagnosis not present

## 2022-12-01 DIAGNOSIS — G720 Drug-induced myopathy: Secondary | ICD-10-CM

## 2022-12-01 DIAGNOSIS — M549 Dorsalgia, unspecified: Secondary | ICD-10-CM | POA: Diagnosis not present

## 2022-12-01 DIAGNOSIS — M4722 Other spondylosis with radiculopathy, cervical region: Secondary | ICD-10-CM | POA: Diagnosis not present

## 2022-12-01 MED ORDER — LOSARTAN POTASSIUM 50 MG PO TABS: 50.0000 mg | ORAL_TABLET | Freq: Every day | ORAL | 1 refills | Status: DC

## 2022-12-01 MED ORDER — SPIRONOLACTONE 25 MG PO TABS: 25.0000 mg | ORAL_TABLET | Freq: Every day | ORAL | 1 refills | Status: DC

## 2022-12-01 MED ORDER — CYANOCOBALAMIN 1000 MCG/ML IJ SOLN
1000.0000 ug | Freq: Once | INTRAMUSCULAR | Status: AC
Start: 2022-12-01 — End: 2022-12-01
  Administered 2022-12-01: 1000 ug via INTRAMUSCULAR

## 2022-12-01 MED ORDER — OMEPRAZOLE MAGNESIUM 20 MG PO TBEC: 20.0000 mg | DELAYED_RELEASE_TABLET | Freq: Every day | ORAL | 1 refills | Status: DC

## 2022-12-01 NOTE — Assessment & Plan Note (Signed)
Reassured patient. Continue to monitor.  

## 2022-12-01 NOTE — Assessment & Plan Note (Signed)
Unable to tolerate statins. Will stop atorvastatin. Will recheck when she's been off meds for about 3 months and determine other treatment.

## 2022-12-01 NOTE — Assessment & Plan Note (Signed)
Would not like to do PT. Would like to discuss injections. Referral to PM&R placed today. Order for x-ray of thoracic spine placed today.

## 2022-12-01 NOTE — Assessment & Plan Note (Signed)
B12 shot given today  

## 2022-12-01 NOTE — Assessment & Plan Note (Signed)
Unable to tolerate statins. Will stop atorvastatin.

## 2022-12-01 NOTE — Progress Notes (Signed)
BP 133/69   Pulse 71   Ht 5\' 5"  (1.651 m)   Wt 175 lb 6.4 oz (79.6 kg)   LMP  (LMP Unknown)   SpO2 98%   BMI 29.19 kg/m    Subjective:    Patient ID: Tamara Fleming, female    DOB: 09-14-1945, 77 y.o.   MRN: [id number]  HPI: Tamara Fleming is a 77 y.o. female  Chief Complaint  Patient presents with   Pain    Patient says she has stopped the Atorvastatin and she has not had any of the pain after a week of stopping.    HYPERLIPIDEMIA Hyperlipidemia status:  stable Satisfied with current treatment?  Unsure- significantly better after stopping atorvastatin Side effects:  yes- myalgias Medication compliance: stopped at our recommendation Past cholesterol meds: atorvastatin Supplements: none Aspirin:  yes The 10-year ASCVD risk score (Arnett DK, et al., 2019) is: 25.1%   Values used to calculate the score:     Age: 41 years     Sex: Female     Is Non-Hispanic African American: No     Diabetic: No     Tobacco smoker: No     Systolic Blood Pressure: 133 mmHg     Is BP treated: Yes     HDL Cholesterol: 69 mg/dL     Total Cholesterol: 193 mg/dL Chest pain:  no Coronary artery disease:  no  Pain is much better since stopping atorvastatin. Continues with pain in her neck and upper back. Had x-ray done earlier this month which showed DJD. Does not want to do PT. Would like to discuss injections. Her husband has seen Dr. Yves Dill in the past. She also notes some red spots on her legs for the past few weeks after being at the beach.  Relevant past medical, surgical, family and social history reviewed and updated as indicated. Interim medical history since our last visit reviewed. Allergies and medications reviewed and updated.  Review of Systems  Constitutional: Negative.   Respiratory: Negative.    Cardiovascular: Negative.   Gastrointestinal: Negative.   Musculoskeletal:  Positive for back pain, myalgias, neck pain and neck stiffness. Negative for arthralgias, gait  problem and joint swelling.  Skin: Negative.   Neurological: Negative.   Psychiatric/Behavioral: Negative.      Per HPI unless specifically indicated above     Objective:    BP 133/69   Pulse 71   Ht 5\' 5"  (1.651 m)   Wt 175 lb 6.4 oz (79.6 kg)   LMP  (LMP Unknown)   SpO2 98%   BMI 29.19 kg/m   Wt Readings from Last 3 Encounters:  12/01/22 175 lb 6.4 oz (79.6 kg)  10/30/22 184 lb (83.5 kg)  06/08/22 187 lb 9.6 oz (85.1 kg)    Physical Exam Vitals and nursing note reviewed.  Constitutional:      General: She is not in acute distress.    Appearance: Normal appearance. She is not ill-appearing, toxic-appearing or diaphoretic.  HENT:     Head: Normocephalic and atraumatic.     Right Ear: External ear normal.     Left Ear: External ear normal.     Nose: Nose normal.     Mouth/Throat:     Mouth: Mucous membranes are moist.     Pharynx: Oropharynx is clear.  Eyes:     General: No scleral icterus.       Right eye: No discharge.        Left eye: No discharge.  Extraocular Movements: Extraocular movements intact.     Conjunctiva/sclera: Conjunctivae normal.     Pupils: Pupils are equal, round, and reactive to light.  Cardiovascular:     Rate and Rhythm: Normal rate and regular rhythm.     Pulses: Normal pulses.     Heart sounds: Normal heart sounds. No murmur heard.    No friction rub. No gallop.  Pulmonary:     Effort: Pulmonary effort is normal. No respiratory distress.     Breath sounds: Normal breath sounds. No stridor. No wheezing, rhonchi or rales.  Chest:     Chest wall: No tenderness.  Musculoskeletal:        General: Normal range of motion.     Cervical back: Normal range of motion and neck supple.  Skin:    General: Skin is warm and dry.     Capillary Refill: Capillary refill takes less than 2 seconds.     Coloration: Skin is not jaundiced or pale.     Findings: No bruising, erythema, lesion or rash.     Comments: Burst capillaries on bilateral legs   Neurological:     General: No focal deficit present.     Mental Status: She is alert and oriented to person, place, and time. Mental status is at baseline.  Psychiatric:        Mood and Affect: Mood normal.        Behavior: Behavior normal.        Thought Content: Thought content normal.        Judgment: Judgment normal.     Results for orders placed or performed in visit on 10/30/22  Lyme Disease Serology w/Reflex  Result Value Ref Range   Lyme Total Antibody EIA Negative Negative  Ehrlichia Antibody Panel  Result Value Ref Range   E.Chaffeensis (HME) IgG Negative Neg:<1:64   E. Chaffeensis (HME) IgM Titer Negative Neg:<1:20   HGE IgG Titer Negative Neg:<1:64   HGE IgM Titer Negative Neg:<1:20   Result Comment: Comment   Spotted Fever Group Antibodies  Result Value Ref Range   Spotted Fever Group IgG <1:64 Neg:<1:64   Spotted Fever Group IgM <1:64 Neg:<1:64   Result Comment Comment   Sed Rate (ESR)  Result Value Ref Range   Sed Rate 11 0 - 40 mm/hr  Comprehensive metabolic panel  Result Value Ref Range   Glucose 111 (H) 70 - 99 mg/dL   BUN 8 8 - 27 mg/dL   Creatinine, Ser 4.09 0.57 - 1.00 mg/dL   eGFR 67 >81 XB/JYN/8.29   BUN/Creatinine Ratio 9 (L) 12 - 28   Sodium 136 134 - 144 mmol/L   Potassium 4.6 3.5 - 5.2 mmol/L   Chloride 99 96 - 106 mmol/L   CO2 23 20 - 29 mmol/L   Calcium 9.7 8.7 - 10.3 mg/dL   Total Protein 6.8 6.0 - 8.5 g/dL   Albumin 4.4 3.8 - 4.8 g/dL   Globulin, Total 2.4 1.5 - 4.5 g/dL   Bilirubin Total 0.8 0.0 - 1.2 mg/dL   Alkaline Phosphatase 104 44 - 121 IU/L   AST 40 0 - 40 IU/L   ALT 34 (H) 0 - 32 IU/L  CBC with Differential/Platelet  Result Value Ref Range   WBC 3.9 3.4 - 10.8 x10E3/uL   RBC 3.90 3.77 - 5.28 x10E6/uL   Hemoglobin 13.4 11.1 - 15.9 g/dL   Hematocrit 56.2 13.0 - 46.6 %   MCV 99 (H) 79 - 97 fL   MCH 34.4 (  H) 26.6 - 33.0 pg   MCHC 34.8 31.5 - 35.7 g/dL   RDW 25.9 56.3 - 87.5 %   Platelets 137 (L) 150 - 450 x10E3/uL    Neutrophils 53 Not Estab. %   Lymphs 31 Not Estab. %   Monocytes 13 Not Estab. %   Eos 3 Not Estab. %   Basos 0 Not Estab. %   Neutrophils Absolute 2.0 1.4 - 7.0 x10E3/uL   Lymphocytes Absolute 1.2 0.7 - 3.1 x10E3/uL   Monocytes Absolute 0.5 0.1 - 0.9 x10E3/uL   EOS (ABSOLUTE) 0.1 0.0 - 0.4 x10E3/uL   Basophils Absolute 0.0 0.0 - 0.2 x10E3/uL   Immature Granulocytes 0 Not Estab. %   Immature Grans (Abs) 0.0 0.0 - 0.1 x10E3/uL  VITAMIN D 25 Hydroxy (Vit-D Deficiency, Fractures)  Result Value Ref Range   Vit D, 25-Hydroxy 31.6 30.0 - 100.0 ng/mL      Assessment & Plan:   Problem List Items Addressed This Visit       Cardiovascular and Mediastinum   Senile purpura (HCC)    Reassured patient. Continue to monitor.       Relevant Medications   losartan (COZAAR) 50 MG tablet   spironolactone (ALDACTONE) 25 MG tablet     Musculoskeletal and Integument   Osteoarthrosis    Would not like to do PT. Would like to discuss injections. Referral to PM&R placed today. Order for x-ray of thoracic spine placed today.      Relevant Orders   Ambulatory referral to Physical Medicine Rehab   Statin myopathy    Unable to tolerate statins. Will stop atorvastatin.         Other   Pure hypercholesterolemia - Primary    Unable to tolerate statins. Will stop atorvastatin. Will recheck when she's been off meds for about 3 months and determine other treatment.       Relevant Medications   losartan (COZAAR) 50 MG tablet   spironolactone (ALDACTONE) 25 MG tablet   B12 deficiency    B12 shot given today.       Other Visit Diagnoses     Upper back pain       Order for x-ray placed today. Await results.   Relevant Orders   DG Thoracic Spine W/Swimmers        Follow up plan: Return in about 2 months (around 01/31/2023) for OK to cancel appointment 12/16/22.

## 2022-12-04 ENCOUNTER — Encounter: Payer: Self-pay | Admitting: Family Medicine

## 2022-12-11 DIAGNOSIS — H2513 Age-related nuclear cataract, bilateral: Secondary | ICD-10-CM | POA: Diagnosis not present

## 2022-12-11 DIAGNOSIS — H43813 Vitreous degeneration, bilateral: Secondary | ICD-10-CM | POA: Diagnosis not present

## 2022-12-16 ENCOUNTER — Ambulatory Visit: Payer: Medicare Other | Admitting: Family Medicine

## 2022-12-22 ENCOUNTER — Other Ambulatory Visit: Payer: Self-pay | Admitting: Nurse Practitioner

## 2022-12-22 DIAGNOSIS — M546 Pain in thoracic spine: Secondary | ICD-10-CM | POA: Diagnosis not present

## 2022-12-22 DIAGNOSIS — M5412 Radiculopathy, cervical region: Secondary | ICD-10-CM

## 2022-12-22 DIAGNOSIS — Z79891 Long term (current) use of opiate analgesic: Secondary | ICD-10-CM | POA: Diagnosis not present

## 2022-12-22 DIAGNOSIS — M47814 Spondylosis without myelopathy or radiculopathy, thoracic region: Secondary | ICD-10-CM | POA: Diagnosis not present

## 2022-12-22 DIAGNOSIS — G8929 Other chronic pain: Secondary | ICD-10-CM | POA: Diagnosis not present

## 2022-12-22 DIAGNOSIS — M542 Cervicalgia: Secondary | ICD-10-CM | POA: Diagnosis not present

## 2023-01-01 ENCOUNTER — Telehealth: Payer: Self-pay | Admitting: Family Medicine

## 2023-01-01 NOTE — Telephone Encounter (Signed)
Copied from CRM [id number]. Topic: Medicare AWV >> Jan 01, 2023 11:06 AM Payton Doughty wrote: Reason for CRM: Called LM 01/01/2023 to schedule AWV   Verlee Rossetti; Care Guide Ambulatory Clinical Support [facility] Direct Dial: [phone]

## 2023-01-09 ENCOUNTER — Ambulatory Visit
Admission: RE | Admit: 2023-01-09 | Discharge: 2023-01-09 | Disposition: A | Discharge status: Home / Self Care | Payer: Medicare Other | Source: Ambulatory Visit | Attending: Nurse Practitioner | Admitting: Nurse Practitioner

## 2023-01-09 DIAGNOSIS — M542 Cervicalgia: Secondary | ICD-10-CM | POA: Diagnosis not present

## 2023-01-09 DIAGNOSIS — M5412 Radiculopathy, cervical region: Secondary | ICD-10-CM

## 2023-01-11 DIAGNOSIS — H2511 Age-related nuclear cataract, right eye: Secondary | ICD-10-CM | POA: Diagnosis not present

## 2023-01-12 ENCOUNTER — Encounter: Payer: Self-pay | Admitting: Ophthalmology

## 2023-01-13 ENCOUNTER — Encounter: Payer: Self-pay | Admitting: Ophthalmology

## 2023-01-13 NOTE — Anesthesia Preprocedure Evaluation (Addendum)
Anesthesia Evaluation  Patient identified by MRN, date of birth, ID band Patient awake    Reviewed: Allergy & Precautions, H&P , NPO status , Patient's Chart, lab work & pertinent test results  History of Anesthesia Complications (+) history of anesthetic complications  Airway Mallampati: III  TM Distance: <3 FB Neck ROM: Full    Dental no notable dental hx.    Pulmonary neg pulmonary ROS Hx DOE   Pulmonary exam normal breath sounds clear to auscultation       Cardiovascular hypertension, + Peripheral Vascular Disease  Normal cardiovascular exam+ Valvular Problems/Murmurs  Rate:Normal + Systolic murmurs 41-32-44 Left ventricle: The cavity size was normal. Wall thickness was    normal. Systolic function was normal. The estimated ejection    fraction was in the range of 55% to 60%. Wall motion was normal;    there were no regional wall motion abnormalities. Left ventricular    diastolic function parameters were normal.  - Aortic valve: Trivial regurgitation.  - Left atrium: The atrium was mildly dilated.   Systolic murmur   Neuro/Psych   Anxiety      Neuromuscular disease  negative psych ROS   GI/Hepatic Neg liver ROS, hiatal hernia,GERD  ,,  Endo/Other  negative endocrine ROS    Renal/GU Renal disease  negative genitourinary   Musculoskeletal  (+) Arthritis ,    Abdominal   Peds negative pediatric ROS (+)  Hematology negative hematology ROS (+)   Anesthesia Other Findings Hypertension  Chest pain Heart murmur  Hiatal hernia OA (osteoarthritis)  Hyperlipidemia Gout  Chronic low back pain Fatigue  Carpal tunnel syndrome Hip pain  Peripheral vascular disease (HCC) GERD (gastroesophageal reflux disease) Complication of anesthesia Diverticulitis  Chest pain with high risk for cardiac etiology Primary osteoarthritis of first carpometacarpal joint of left hand  Primary osteoarthritis of left hip Arthritis  of left hip  Piriformis syndrome of right side Low back pain (secondary) bilateral (right greater than left)  Basal cell carcinoma Actinic keratosis    DOE Note that patient is on phentermine Anxiety History of heart attack Neck and back pain   Reproductive/Obstetrics negative OB ROS                             Anesthesia Physical Anesthesia Plan  ASA: 3  Anesthesia Plan: MAC   Post-op Pain Management:    Induction: Intravenous  PONV Risk Score and Plan:   Airway Management Planned: Natural Airway and Nasal Cannula  Additional Equipment:   Intra-op Plan:   Post-operative Plan:   Informed Consent: I have reviewed the patients History and Physical, chart, labs and discussed the procedure including the risks, benefits and alternatives for the proposed anesthesia with the patient or authorized representative who has indicated his/her understanding and acceptance.     Dental Advisory Given  Plan Discussed with: Anesthesiologist, CRNA and Surgeon  Anesthesia Plan Comments: (Patient consented for risks of anesthesia including but not limited to:  - adverse reactions to medications - damage to eyes, teeth, lips or other oral mucosa - nerve damage due to positioning  - sore throat or hoarseness - Damage to heart, brain, nerves, lungs, other parts of body or loss of life  Patient voiced understanding and assent.)        Anesthesia Quick Evaluation

## 2023-01-18 DIAGNOSIS — K219 Gastro-esophageal reflux disease without esophagitis: Secondary | ICD-10-CM | POA: Diagnosis not present

## 2023-01-18 DIAGNOSIS — R0609 Other forms of dyspnea: Secondary | ICD-10-CM | POA: Diagnosis not present

## 2023-01-18 DIAGNOSIS — J301 Allergic rhinitis due to pollen: Secondary | ICD-10-CM | POA: Diagnosis not present

## 2023-01-19 DIAGNOSIS — Z79891 Long term (current) use of opiate analgesic: Secondary | ICD-10-CM | POA: Diagnosis not present

## 2023-01-19 DIAGNOSIS — M5412 Radiculopathy, cervical region: Secondary | ICD-10-CM | POA: Diagnosis not present

## 2023-01-19 DIAGNOSIS — M47814 Spondylosis without myelopathy or radiculopathy, thoracic region: Secondary | ICD-10-CM | POA: Diagnosis not present

## 2023-01-19 DIAGNOSIS — M542 Cervicalgia: Secondary | ICD-10-CM | POA: Diagnosis not present

## 2023-01-19 DIAGNOSIS — M546 Pain in thoracic spine: Secondary | ICD-10-CM | POA: Diagnosis not present

## 2023-01-19 DIAGNOSIS — G8929 Other chronic pain: Secondary | ICD-10-CM | POA: Diagnosis not present

## 2023-01-19 NOTE — Discharge Instructions (Signed)

## 2023-01-20 ENCOUNTER — Encounter
Admission: RE | Disposition: A | Discharge status: Home / Self Care | Payer: Self-pay | Source: Home / Self Care | Attending: Ophthalmology

## 2023-01-20 ENCOUNTER — Ambulatory Visit
Admission: RE | Admit: 2023-01-20 | Discharge: 2023-01-20 | Disposition: A | Discharge status: Home / Self Care | Payer: Medicare Other | Attending: Ophthalmology | Admitting: Ophthalmology

## 2023-01-20 ENCOUNTER — Other Ambulatory Visit: Payer: Self-pay

## 2023-01-20 ENCOUNTER — Ambulatory Visit: Payer: Medicare Other | Admitting: Anesthesiology

## 2023-01-20 ENCOUNTER — Encounter: Payer: Self-pay | Admitting: Ophthalmology

## 2023-01-20 DIAGNOSIS — I10 Essential (primary) hypertension: Secondary | ICD-10-CM | POA: Insufficient documentation

## 2023-01-20 DIAGNOSIS — I252 Old myocardial infarction: Secondary | ICD-10-CM | POA: Insufficient documentation

## 2023-01-20 DIAGNOSIS — M545 Low back pain, unspecified: Secondary | ICD-10-CM | POA: Diagnosis not present

## 2023-01-20 DIAGNOSIS — Z85828 Personal history of other malignant neoplasm of skin: Secondary | ICD-10-CM | POA: Insufficient documentation

## 2023-01-20 DIAGNOSIS — H2511 Age-related nuclear cataract, right eye: Secondary | ICD-10-CM | POA: Insufficient documentation

## 2023-01-20 DIAGNOSIS — G8929 Other chronic pain: Secondary | ICD-10-CM | POA: Insufficient documentation

## 2023-01-20 DIAGNOSIS — K219 Gastro-esophageal reflux disease without esophagitis: Secondary | ICD-10-CM | POA: Diagnosis not present

## 2023-01-20 HISTORY — DX: Anxiety disorder, unspecified: F41.9

## 2023-01-20 HISTORY — DX: Old myocardial infarction: I25.2

## 2023-01-20 HISTORY — DX: Esophageal obstruction: K22.2

## 2023-01-20 HISTORY — PX: CATARACT EXTRACTION W/PHACO: SHX586

## 2023-01-20 SURGERY — PHACOEMULSIFICATION, CATARACT, WITH IOL INSERTION
Anesthesia: Monitor Anesthesia Care | Laterality: Right

## 2023-01-20 MED ORDER — ARMC OPHTHALMIC DILATING DROPS
OPHTHALMIC | Status: AC
  Filled 2023-01-20: qty 0.5

## 2023-01-20 MED ORDER — SIGHTPATH DOSE#1 BSS IO SOLN
INTRAOCULAR | Status: DC | PRN
  Administered 2023-01-20: 15 mL via INTRAOCULAR

## 2023-01-20 MED ORDER — BRIMONIDINE TARTRATE-TIMOLOL 0.2-0.5 % OP SOLN
OPHTHALMIC | Status: DC | PRN
  Administered 2023-01-20: 1 [drp] via OPHTHALMIC

## 2023-01-20 MED ORDER — SIGHTPATH DOSE#1 BSS IO SOLN
INTRAOCULAR | Status: DC | PRN
  Administered 2023-01-20: 2 mL

## 2023-01-20 MED ORDER — CEFUROXIME OPHTHALMIC INJECTION 1 MG/0.1 ML
INJECTION | OPHTHALMIC | Status: DC | PRN
  Administered 2023-01-20: 1 mg via INTRACAMERAL

## 2023-01-20 MED ORDER — TETRACAINE HCL 0.5 % OP SOLN
OPHTHALMIC | Status: AC
  Filled 2023-01-20: qty 4

## 2023-01-20 MED ORDER — MIDAZOLAM HCL 2 MG/2ML IJ SOLN
INTRAMUSCULAR | Status: DC | PRN
  Administered 2023-01-20: 2 mg via INTRAVENOUS

## 2023-01-20 MED ORDER — SIGHTPATH DOSE#1 NA HYALUR & NA CHOND-NA HYALUR IO KIT
PACK | INTRAOCULAR | Status: DC | PRN
  Administered 2023-01-20: 1 via OPHTHALMIC

## 2023-01-20 MED ORDER — ARMC OPHTHALMIC DILATING DROPS
1.0000 | OPHTHALMIC | Status: DC | PRN
  Administered 2023-01-20 (×3): 1 via OPHTHALMIC

## 2023-01-20 MED ORDER — TETRACAINE HCL 0.5 % OP SOLN
1.0000 [drp] | OPHTHALMIC | Status: DC | PRN
  Administered 2023-01-20 (×3): 1 [drp] via OPHTHALMIC

## 2023-01-20 MED ORDER — SIGHTPATH DOSE#1 BSS IO SOLN
INTRAOCULAR | Status: DC | PRN
  Administered 2023-01-20: 67 mL via OPHTHALMIC

## 2023-01-20 MED ORDER — MIDAZOLAM HCL 2 MG/2ML IJ SOLN
INTRAMUSCULAR | Status: AC
  Filled 2023-01-20: qty 2

## 2023-01-20 MED ORDER — LACTATED RINGERS IV SOLN: INTRAVENOUS | Status: DC

## 2023-01-20 SURGICAL SUPPLY — 10 items
CATARACT SUITE SIGHTPATH (MISCELLANEOUS) ×1
FEE CATARACT SUITE SIGHTPATH (MISCELLANEOUS) ×1 IMPLANT
GLOVE SRG 8 PF TXTR STRL LF DI (GLOVE) ×1 IMPLANT
GLOVE SURG ENC TEXT LTX SZ7.5 (GLOVE) ×1 IMPLANT
GLOVE SURG UNDER POLY LF SZ8 (GLOVE) ×1
LENS CLAREON VIVITY 19.5 ×1 IMPLANT
LENS IOL CLRN PAN 4 19.5 IMPLANT
NDL FILTER BLUNT 18X1 1/2 (NEEDLE) ×1 IMPLANT
NEEDLE FILTER BLUNT 18X1 1/2 (NEEDLE) ×1
SYR 3ML LL SCALE MARK (SYRINGE) ×1 IMPLANT

## 2023-01-20 NOTE — Anesthesia Postprocedure Evaluation (Signed)
Anesthesia Post Note  Patient: Tamara Fleming  Procedure(s) Performed: CATARACT EXTRACTION PHACO AND INTRAOCULAR LENS PLACEMENT (IOC) RIGHT  CLAREON PANOPTIC TORIC 12.87 01:02.9 (Right)  Patient location during evaluation: PACU Anesthesia Type: MAC Level of consciousness: awake and alert Pain management: pain level controlled Vital Signs Assessment: post-procedure vital signs reviewed and stable Respiratory status: spontaneous breathing, nonlabored ventilation, respiratory function stable and patient connected to nasal cannula oxygen Cardiovascular status: stable and blood pressure returned to baseline Postop Assessment: no apparent nausea or vomiting Anesthetic complications: no   No notable events documented.   Last Vitals:  Vitals:   01/20/23 1314 01/20/23 1319  BP: 120/67 123/72  Pulse: 84   Resp: 18   Temp: (!) 36.4 C 36.5 C  SpO2: 100%     Last Pain:  Vitals:   01/20/23 1319  TempSrc:   PainSc: 0-No pain                 Miette Molenda C Avel Ogawa

## 2023-01-20 NOTE — H&P (Signed)
[facility]   Primary Care Physician:  Dorcas Carrow, DO Ophthalmologist: Dr. Lockie Mola  Pre-Procedure History & Physical: HPI:  Tamara Fleming is a 77 y.o. female here for ophthalmic surgery.   Past Medical History:  Diagnosis Date   Actinic keratosis    Anxiety    Arthritis of left hip 02/17/2017   Basal cell carcinoma 10/30/2019   L chin. Superficial.   Benign esophageal stricture    Carpal tunnel syndrome 05/09/2014   Right   Chest pain    Chest pain with high risk for cardiac etiology 10/20/2010   Chronic low back pain    Complication of anesthesia    small mouth   Diverticulitis 02/17/2017   Fatigue    GERD (gastroesophageal reflux disease)    occ   Gout    Heart murmur    Hiatal hernia    Hip pain    Left   History of heart attack    Hyperlipidemia    Hypertension    Low back pain (secondary) bilateral (right greater than left) 09/30/2016   OA (osteoarthritis)    Peripheral vascular disease (HCC)    plhebitis 20 yrs ago   Piriformis syndrome of right side 02/17/2017   Primary osteoarthritis of first carpometacarpal joint of left hand 09/10/2016   Primary osteoarthritis of left hip 03/23/2017    Past Surgical History:  Procedure Laterality Date   ABDOMINAL HYSTERECTOMY  1982   Partial   APPENDECTOMY     ARM SURGERY Left    OF THE ULNA NERVE   BREAST EXCISIONAL BIOPSY Right    CARDIOVASCULAR STRESS TEST  08/11/2006   NORMAL. EF 55-60%   CARDIOVASCULAR STRESS TEST  10/31/1998   EF 60% AT REST TO 75-80% POST EXERCISE   CARPAL TUNNEL RELEASE Right 2015   ESOPHAGOGASTRODUODENOSCOPY  05/16/1990   ESOPHAGOGASTRODUODENOSCOPY (EGD) WITH PROPOFOL N/A 02/04/2015   Procedure: ESOPHAGOGASTRODUODENOSCOPY (EGD) WITH PROPOFOL;  Surgeon: Wallace Cullens, MD;  Location: [facility] ENDOSCOPY;  Service: Gastroenterology;  Laterality: N/A;   JOINT REPLACEMENT     REDUCTION MAMMAPLASTY  06/11/15   TOTAL HIP ARTHROPLASTY Left 04/13/2017   Procedure: TOTAL LEFT HIP  ARTHROPLASTY ANTERIOR APPROACH;  Surgeon: Sheral Apley, MD;  Location: [facility] OR;  Service: Orthopedics;  Laterality: Left;    Prior to Admission medications   Medication Sig Start Date End Date Taking? Authorizing Provider  aspirin EC 81 MG tablet Take 81 mg by mouth daily.   Yes [provider]  Cyanocobalamin (VITAMIN B-12 IJ) Inject as directed every 30 (thirty) days.   Yes [provider]  Dapsone 7.5 % GEL Apply to legs once a day 03/04/22  Yes Deirdre Evener, MD  HYDROcodone-acetaminophen (NORCO/VICODIN) 5-325 MG tablet Take 1 tablet by mouth 4 (four) times daily as needed for moderate pain. 12/20/18  Yes Johnson, Megan P, DO  losartan (COZAAR) 50 MG tablet Take 1 tablet (50 mg total) by mouth daily. 12/01/22  Yes Johnson, Megan P, DO  omeprazole (PRILOSEC OTC) 20 MG tablet Take 1 tablet (20 mg total) by mouth daily. 12/01/22  Yes Johnson, Megan P, DO  phentermine (ADIPEX-P) 37.5 MG tablet Take 37.5 mg by mouth daily before breakfast.   Yes [provider]  Ruxolitinib Phosphate (OPZELURA) 1.5 % CREA Apply topically to affected areas qd for Granuloma annulare 08/27/22  Yes Deirdre Evener, MD  spironolactone (ALDACTONE) 25 MG tablet Take 1 tablet (25 mg total) by mouth daily. 12/01/22  Yes Johnson, Megan P,  DO  triamcinolone ointment (KENALOG) 0.5 % Apply 1 Application topically 2 (two) times daily. 03/09/22  Yes Johnson, Megan P, DO  ALPRAZolam Prudy Feeler) 0.5 MG tablet Take 30 minutes prior to flight Patient not taking: Reported on 01/12/2023 10/30/22   Olevia Perches P, DO  Biotin 57846 MCG TABS Take by mouth at bedtime. Patient not taking: Reported on 01/20/2023    [provider]  budesonide-formoterol (SYMBICORT) 160-4.5 MCG/ACT inhaler Inhale 2 puffs into the lungs 2 (two) times daily. Patient not taking: Reported on 01/12/2023 06/08/22   Olevia Perches P, DO  celecoxib (CELEBREX) 100 MG capsule 1 capsule with food Orally twice a day as needed for 30  days Patient not taking: Reported on 01/12/2023 09/24/22   [provider]  furosemide (LASIX) 20 MG tablet Take 1 tablet (20 mg total) by mouth daily. PRN for severe swelling Patient not taking: Reported on 01/12/2023 04/16/21   Olevia Perches P, DO  indomethacin (INDOCIN) 50 MG capsule Take 1 capsule (50 mg total) by mouth 3 (three) times daily with meals. For gout flare Patient not taking: Reported on 01/12/2023 06/08/22   Olevia Perches P, DO    Allergies as of 12/15/2022 - Review Complete 12/01/2022  Allergen Reaction Noted   Ciprofloxacin Anaphylaxis 04/22/2011   Floxin [ofloxacin] Anaphylaxis 10/13/2010   Lipitor [atorvastatin] Other (See Comments) 12/01/2022   Sulfa antibiotics Diarrhea and Nausea And Vomiting 05/16/2015    Family History  Problem Relation Age of Onset   Coronary artery disease Mother    Cancer Father        Lung    Social History   Socioeconomic History   Marital status: Married    Spouse name: Not on file   Number of children: Not on file   Years of education: 32, 1 year college    Highest education level: Some college, no degree  Occupational History   Occupation: self employed   Tobacco Use   Smoking status: Never   Smokeless tobacco: Never  Vaping Use   Vaping status: Never Used  Substance and Sexual Activity   Alcohol use: Yes    Alcohol/week: 21.0 standard drinks of alcohol    Types: 21 Glasses of wine per week   Drug use: Yes    Comment: prescribed hydrocodone   Sexual activity: Not Currently    Birth control/protection: None  Other Topics Concern   Not on file  Social History Narrative   Self employed   Social Determinants of Health   Financial Resource Strain: Low Risk  (10/05/2022)   Received from [facility]   Overall Financial Resource Strain (CARDIA)    Difficulty of Paying Living Expenses: Not hard at all  Food Insecurity: No Food Insecurity (10/05/2022)   Received from [facility]    Hunger Vital Sign    Worried About Running Out of Food in the Last Year: Never true    Ran Out of Food in the Last Year: Never true  Transportation Needs: No Transportation Needs (10/05/2022)   Received from [facility] - Transportation    In the past 12 months, has lack of transportation kept you from medical appointments or from getting medications?: No    Lack of Transportation (Non-Medical): No  Physical Activity: Inactive (12/31/2021)   Exercise Vital Sign    Days of Exercise per Week: 0 days    Minutes of Exercise per Session: 0 min  Stress: No Stress Concern Present (12/31/2021)  [employer] of Occupational Health - Occupational Stress Questionnaire    Feeling of Stress : Not at all  Social Connections: Moderately Integrated (12/31/2021)   Social Connection and Isolation Panel [NHANES]    Frequency of Communication with Friends and Family: More than three times a week    Frequency of Social Gatherings with Friends and Family: Three times a week    Attends Religious Services: More than 4 times per year    Active Member of Clubs or Organizations: No    Attends Banker Meetings: Never    Marital Status: Married  Catering manager Violence: Not At Risk (12/31/2021)   Humiliation, Afraid, Rape, and Kick questionnaire    Fear of Current or Ex-Partner: No    Emotionally Abused: No    Physically Abused: No    Sexually Abused: No    Review of Systems: See HPI, otherwise negative ROS  Physical Exam: BP 132/81   Pulse 79   Temp 98.6 F (37 C) (Temporal)   Resp 17   Ht 5\' 5"  (1.651 m)   Wt 76.7 kg   LMP  (LMP Unknown)   SpO2 98%   BMI 28.12 kg/m  General:   Alert,  pleasant and cooperative in NAD Head:  Normocephalic and atraumatic. Lungs:  Clear to auscultation.    Heart:  Regular rate and rhythm.   Impression/Plan: Tamara Fleming is here for ophthalmic surgery.  Risks, benefits, limitations, and alternatives regarding  ophthalmic surgery have been reviewed with the patient.  Questions have been answered.  All parties agreeable.   Lockie Mola, MD  01/20/2023, 12:02 PM

## 2023-01-20 NOTE — Op Note (Signed)
LOCATION:  [facility]   PREOPERATIVE DIAGNOSIS:  Nuclear sclerotic cataract of the right eye.  H25.11   POSTOPERATIVE DIAGNOSIS:  Nuclear sclerotic cataract of the right eye.   PROCEDURE:  Phacoemulsification with Toric posterior chamber intraocular lens placement of the right eye.  Ultrasound time: Procedure(s): CATARACT EXTRACTION PHACO AND INTRAOCULAR LENS PLACEMENT (IOC) RIGHT  CLAREON PANOPTIC TORIC 12.87 01:02.9 (Right)  LENS:   Implant Name Type Inv. Item Serial No. Manufacturer Lot No. LRB No. Used Action  LENS CLAREON VIVITY 19.5 - [id number]  LENS CLAREON VIVITY 19.5 [id number] SIGHT[employer]  Right 1 Implanted     [id number] 19.5 D Panoptix Toric intraocular lens with 2.25 diopters of cylindrical power with axis orientation at 27 degrees.   SURGEON:  Deirdre Evener, MD   ANESTHESIA: Topical with tetracaine drops and 2% Xylocaine jelly, augmented with 1% preservative-free intracameral lidocaine. .   COMPLICATIONS:  None.   DESCRIPTION OF PROCEDURE:  The patient was identified in the holding room and transported to the operating suite and placed in the supine position under the operating microscope.  The right eye was identified as the operative eye, and it was prepped and draped in the usual sterile ophthalmic fashion.    A clear-corneal paracentesis incision was made at the 12:00 position.  0.5 ml of preservative-free 1% lidocaine was injected into the anterior chamber. The anterior chamber was filled with Viscoat.  A 2.4 millimeter near clear corneal incision was then made at the 9:00 position.  A cystotome and capsulorrhexis forceps were then used to make a curvilinear capsulorrhexis.  Hydrodissection and hydrodelineation were then performed using balanced salt solution.   Phacoemulsification was then used in stop and chop fashion to remove the lens, nucleus and epinucleus.  The remaining cortex was aspirated using the irrigation and aspiration handpiece.  Provisc  viscoelastic was then placed into the capsular bag to distend it for lens placement.  The Verion digital marker was used to align the implant at the intended axis.   A Toric lens was then injected into the capsular bag.  It was rotated clockwise until the axis marks on the lens were approximately 15 degrees in the counterclockwise direction to the intended alignment.  The viscoelastic was aspirated from the eye using the irrigation aspiration handpiece.  Then, a Koch spatula through the sideport incision was used to rotate the lens in a clockwise direction until the axis markings of the intraocular lens were lined up with the Verion alignment.  Balanced salt solution was then used to hydrate the wounds. Cefuroxime 0.1 ml of a 10mg /ml solution was injected into the anterior chamber for a dose of 1 mg of intracameral antibiotic at the completion of the case.    The eye was noted to have a physiologic pressure and there was no wound leak noted.   Timolol and Brimonidine drops were applied to the eye.  The patient was taken to the recovery room in stable condition having had no complications of anesthesia or surgery.  Halimah Bewick 01/20/2023, 1:13 PM

## 2023-01-20 NOTE — Transfer of Care (Signed)
Immediate Anesthesia Transfer of Care Note  Patient: Tamara Fleming  Procedure(s) Performed: CATARACT EXTRACTION PHACO AND INTRAOCULAR LENS PLACEMENT (IOC) RIGHT  CLAREON PANOPTIC TORIC 12.87 01:02.9 (Right)  Patient Location: PACU  Anesthesia Type:MAC  Level of Consciousness: awake, alert , and oriented  Airway & Oxygen Therapy: Patient Spontanous Breathing  Post-op Assessment: Report given to RN and Post -op Vital signs reviewed and stable  Post vital signs: Reviewed and stable  Last Vitals:  Vitals Value Taken Time  BP    Temp    Pulse    Resp    SpO2      Last Pain:  Vitals:   01/20/23 1151  TempSrc: Temporal      Patients Stated Pain Goal: 6 (01/20/23 1151)  Complications: No notable events documented.

## 2023-01-21 ENCOUNTER — Encounter: Payer: Self-pay | Admitting: Ophthalmology

## 2023-01-26 DIAGNOSIS — H2512 Age-related nuclear cataract, left eye: Secondary | ICD-10-CM | POA: Diagnosis not present

## 2023-01-27 NOTE — Plan of Care (Signed)
[facility] Tonsillectomy/Adenoidectomy, Postoperative PEDS care plan entered in error.

## 2023-02-01 ENCOUNTER — Encounter: Payer: Self-pay | Admitting: Family Medicine

## 2023-02-01 ENCOUNTER — Ambulatory Visit (INDEPENDENT_AMBULATORY_CARE_PROVIDER_SITE_OTHER): Payer: Medicare Other | Admitting: Family Medicine

## 2023-02-01 VITALS — BP 144/75 | HR 76 | Ht 65.0 in | Wt 168.8 lb

## 2023-02-01 DIAGNOSIS — G720 Drug-induced myopathy: Secondary | ICD-10-CM

## 2023-02-01 DIAGNOSIS — E7800 Pure hypercholesterolemia, unspecified: Secondary | ICD-10-CM

## 2023-02-01 DIAGNOSIS — T466X5A Adverse effect of antihyperlipidemic and antiarteriosclerotic drugs, initial encounter: Secondary | ICD-10-CM | POA: Diagnosis not present

## 2023-02-01 DIAGNOSIS — E538 Deficiency of other specified B group vitamins: Secondary | ICD-10-CM

## 2023-02-01 DIAGNOSIS — R82998 Other abnormal findings in urine: Secondary | ICD-10-CM

## 2023-02-01 DIAGNOSIS — Z23 Encounter for immunization: Secondary | ICD-10-CM | POA: Diagnosis not present

## 2023-02-01 DIAGNOSIS — L659 Nonscarring hair loss, unspecified: Secondary | ICD-10-CM

## 2023-02-01 DIAGNOSIS — E663 Overweight: Secondary | ICD-10-CM

## 2023-02-01 LAB — URINALYSIS, ROUTINE W REFLEX MICROSCOPIC
Bilirubin, UA: NEGATIVE
Glucose, UA: NEGATIVE
Ketones, UA: NEGATIVE
Nitrite, UA: NEGATIVE
RBC, UA: NEGATIVE
Specific Gravity, UA: 1.02 (ref 1.005–1.030)
Urobilinogen, Ur: 1 mg/dL (ref 0.2–1.0)
pH, UA: 6 (ref 5.0–7.5)

## 2023-02-01 LAB — MICROSCOPIC EXAMINATION: Bacteria, UA: NONE SEEN

## 2023-02-01 MED ORDER — CYANOCOBALAMIN 1000 MCG/ML IJ SOLN
1000.0000 ug | Freq: Once | INTRAMUSCULAR | Status: AC
Start: 2023-02-01 — End: 2023-02-01
  Administered 2023-02-01: 1000 ug via INTRAMUSCULAR

## 2023-02-01 NOTE — Assessment & Plan Note (Signed)
Congratulated patient on continued weight loss. She would like to take zepbound. Discussed that it is not covered by her insurance and it is quite expensive. Information provided today. If she decided she would like to try it we can send it in.

## 2023-02-01 NOTE — Assessment & Plan Note (Signed)
Resolved after stopping statin. Rechecking labs today. Await results.

## 2023-02-01 NOTE — Assessment & Plan Note (Signed)
Unable to tolerate statins. Rechecking labs today. Call with any concerns.

## 2023-02-01 NOTE — Discharge Instructions (Signed)

## 2023-02-01 NOTE — Assessment & Plan Note (Signed)
B12 shot given today  

## 2023-02-01 NOTE — Progress Notes (Signed)
BP (!) 144/75   Pulse 76   Ht 5\' 5"  (1.651 m)   Wt 168 lb 12.8 oz (76.6 kg)   LMP  (LMP Unknown)   SpO2 98%   BMI 28.09 kg/m    Subjective:    Patient ID: Tamara Fleming, female    DOB: 1946/01/03, 77 y.o.   MRN: [id number]  HPI: Tamara Fleming is a 77 y.o. female  Chief Complaint  Patient presents with   Hyperlipidemia   HYPERLIPIDEMIA Hyperlipidemia status:  Stable Satisfied with current treatment?  yes Side effects:  yes Medication compliance: off medicine due to myalgias Past cholesterol meds:atorvastatin, crestor Supplements: none Aspirin:  no The 10-year ASCVD risk score (Arnett DK, et al., 2019) is: 28.8%   Values used to calculate the score:     Age: 79 years     Sex: Female     Is Non-Hispanic African American: No     Diabetic: No     Tobacco smoker: No     Systolic Blood Pressure: 144 mmHg     Is BP treated: Yes     HDL Cholesterol: 69 mg/dL     Total Cholesterol: 193 mg/dL Chest pain:  no Coronary artery disease:  no Family history CAD:  yes  OVERWEIGHT Duration: chroinc Previous attempts at weight loss: yes Complications of obesity: HLD, IFG, HTN, gout Peak weight: 187lbs Weight loss goal: unset Weight loss to date: 19lbs Requesting obesity pharmacotherapy: yes Current weight loss supplements/medications: yes- phentermine Previous weight loss supplements/meds: yes- phentermine  Relevant past medical, surgical, family and social history reviewed and updated as indicated. Interim medical history since our last visit reviewed. Allergies and medications reviewed and updated.  Review of Systems  Constitutional: Negative.   Eyes:  Positive for visual disturbance. Negative for photophobia, pain, discharge, redness and itching.  Respiratory: Negative.    Cardiovascular: Negative.   Musculoskeletal: Negative.   Skin: Negative.   Psychiatric/Behavioral: Negative.      Per HPI unless specifically indicated above     Objective:    BP (!)  144/75   Pulse 76   Ht 5\' 5"  (1.651 m)   Wt 168 lb 12.8 oz (76.6 kg)   LMP  (LMP Unknown)   SpO2 98%   BMI 28.09 kg/m   Wt Readings from Last 3 Encounters:  02/01/23 168 lb 12.8 oz (76.6 kg)  01/20/23 169 lb (76.7 kg)  12/01/22 175 lb 6.4 oz (79.6 kg)    Physical Exam Vitals and nursing note reviewed.  Constitutional:      General: She is not in acute distress.    Appearance: Normal appearance. She is not ill-appearing, toxic-appearing or diaphoretic.  HENT:     Head: Normocephalic and atraumatic.     Right Ear: External ear normal.     Left Ear: External ear normal.     Nose: Nose normal.     Mouth/Throat:     Mouth: Mucous membranes are moist.     Pharynx: Oropharynx is clear.  Eyes:     General: No scleral icterus.       Right eye: No discharge.        Left eye: No discharge.     Extraocular Movements: Extraocular movements intact.     Conjunctiva/sclera: Conjunctivae normal.     Pupils: Pupils are equal, round, and reactive to light.  Cardiovascular:     Rate and Rhythm: Normal rate and regular rhythm.     Pulses: Normal pulses.  Heart sounds: Normal heart sounds. No murmur heard.    No friction rub. No gallop.  Pulmonary:     Effort: Pulmonary effort is normal. No respiratory distress.     Breath sounds: Normal breath sounds. No stridor. No wheezing, rhonchi or rales.  Chest:     Chest wall: No tenderness.  Musculoskeletal:        General: Normal range of motion.     Cervical back: Normal range of motion and neck supple.  Skin:    General: Skin is warm and dry.     Capillary Refill: Capillary refill takes less than 2 seconds.     Coloration: Skin is not jaundiced or pale.     Findings: No bruising, erythema, lesion or rash.  Neurological:     General: No focal deficit present.     Mental Status: She is alert and oriented to person, place, and time. Mental status is at baseline.  Psychiatric:        Mood and Affect: Mood normal.        Behavior: Behavior  normal.        Thought Content: Thought content normal.        Judgment: Judgment normal.     Results for orders placed or performed in visit on 12/04/22  HM COLONOSCOPY  Result Value Ref Range   HM Colonoscopy See Report (in chart) See Report (in chart), Patient Reported      Assessment & Plan:   Problem List Items Addressed This Visit       Musculoskeletal and Integument   Statin myopathy    Resolved after stopping statin. Rechecking labs today. Await results.         Other   Pure hypercholesterolemia - Primary    Unable to tolerate statins. Rechecking labs today. Call with any concerns.       Relevant Orders   Comprehensive metabolic panel   Lipid Panel w/o Chol/HDL Ratio   B12 deficiency    B12 shot given today.       Overweight (BMI 25.0-29.9)    Congratulated patient on continued weight loss. She would like to take zepbound. Discussed that it is not covered by her insurance and it is quite expensive. Information provided today. If she decided she would like to try it we can send it in.       Other Visit Diagnoses     Dark urine       Will check UA. Await results.   Relevant Orders   Urinalysis, Routine w reflex microscopic   Hair loss       Following with dermatology. TSH checked today.   Relevant Orders   TSH   Needs flu shot       Flu shot given today.   Relevant Orders   Flu Vaccine Trivalent High Dose (Fluad) (Completed)        Follow up plan: Return in about 4 months (around 06/04/2023).

## 2023-02-02 LAB — COMPREHENSIVE METABOLIC PANEL
ALT: 27 [IU]/L (ref 0–32)
AST: 27 [IU]/L (ref 0–40)
Albumin: 4 g/dL (ref 3.8–4.8)
Alkaline Phosphatase: 98 [IU]/L (ref 44–121)
BUN/Creatinine Ratio: 12 (ref 12–28)
BUN: 10 mg/dL (ref 8–27)
Bilirubin Total: 1 mg/dL (ref 0.0–1.2)
CO2: 22 mmol/L (ref 20–29)
Calcium: 10 mg/dL (ref 8.7–10.3)
Chloride: 99 mmol/L (ref 96–106)
Creatinine, Ser: 0.85 mg/dL (ref 0.57–1.00)
Globulin, Total: 2.8 g/dL (ref 1.5–4.5)
Glucose: 111 mg/dL — ABNORMAL HIGH (ref 70–99)
Potassium: 4.3 mmol/L (ref 3.5–5.2)
Sodium: 137 mmol/L (ref 134–144)
Total Protein: 6.8 g/dL (ref 6.0–8.5)
eGFR: 71 mL/min/{1.73_m2} (ref >59)

## 2023-02-02 LAB — TSH: TSH: 2.01 u[IU]/mL (ref 0.450–4.500)

## 2023-02-02 LAB — LIPID PANEL W/O CHOL/HDL RATIO
Cholesterol, Total: 205 mg/dL — ABNORMAL HIGH (ref 100–199)
HDL: 69 mg/dL (ref >39)
LDL Chol Calc (NIH): 119 mg/dL — ABNORMAL HIGH (ref 0–99)
Triglycerides: 96 mg/dL (ref 0–149)
VLDL Cholesterol Cal: 17 mg/dL (ref 5–40)

## 2023-02-03 ENCOUNTER — Ambulatory Visit: Payer: Medicare Other | Admitting: Anesthesiology

## 2023-02-03 ENCOUNTER — Encounter
Admission: RE | Disposition: A | Discharge status: Home / Self Care | Payer: Self-pay | Source: Home / Self Care | Attending: Ophthalmology

## 2023-02-03 ENCOUNTER — Encounter: Payer: Self-pay | Admitting: Ophthalmology

## 2023-02-03 ENCOUNTER — Other Ambulatory Visit: Payer: Self-pay

## 2023-02-03 ENCOUNTER — Ambulatory Visit
Admission: RE | Admit: 2023-02-03 | Discharge: 2023-02-03 | Disposition: A | Discharge status: Home / Self Care | Payer: Medicare Other | Attending: Ophthalmology | Admitting: Ophthalmology

## 2023-02-03 DIAGNOSIS — H269 Unspecified cataract: Secondary | ICD-10-CM | POA: Diagnosis not present

## 2023-02-03 DIAGNOSIS — H2512 Age-related nuclear cataract, left eye: Secondary | ICD-10-CM | POA: Insufficient documentation

## 2023-02-03 DIAGNOSIS — I10 Essential (primary) hypertension: Secondary | ICD-10-CM | POA: Diagnosis not present

## 2023-02-03 HISTORY — PX: CATARACT EXTRACTION W/PHACO: SHX586

## 2023-02-03 SURGERY — PHACOEMULSIFICATION, CATARACT, WITH IOL INSERTION
Anesthesia: Monitor Anesthesia Care | Laterality: Left

## 2023-02-03 MED ORDER — SIGHTPATH DOSE#1 BSS IO SOLN
INTRAOCULAR | Status: DC | PRN
  Administered 2023-02-03: 2 mL

## 2023-02-03 MED ORDER — MIDAZOLAM HCL 2 MG/2ML IJ SOLN
INTRAMUSCULAR | Status: DC | PRN
  Administered 2023-02-03: 2 mg via INTRAVENOUS

## 2023-02-03 MED ORDER — CEFUROXIME OPHTHALMIC INJECTION 1 MG/0.1 ML
INJECTION | OPHTHALMIC | Status: DC | PRN
  Administered 2023-02-03: 1 mg via INTRACAMERAL

## 2023-02-03 MED ORDER — BRIMONIDINE TARTRATE-TIMOLOL 0.2-0.5 % OP SOLN
OPHTHALMIC | Status: DC | PRN
  Administered 2023-02-03: 1 [drp] via OPHTHALMIC

## 2023-02-03 MED ORDER — SIGHTPATH DOSE#1 BSS IO SOLN
INTRAOCULAR | Status: DC | PRN
  Administered 2023-02-03: 15 mL via INTRAOCULAR

## 2023-02-03 MED ORDER — TETRACAINE HCL 0.5 % OP SOLN
OPHTHALMIC | Status: AC
  Filled 2023-02-03: qty 4

## 2023-02-03 MED ORDER — SIGHTPATH DOSE#1 BSS IO SOLN
INTRAOCULAR | Status: DC | PRN
  Administered 2023-02-03: 67 mL via OPHTHALMIC

## 2023-02-03 MED ORDER — SIGHTPATH DOSE#1 NA HYALUR & NA CHOND-NA HYALUR IO KIT
PACK | INTRAOCULAR | Status: DC | PRN
  Administered 2023-02-03: 1 via OPHTHALMIC

## 2023-02-03 MED ORDER — MIDAZOLAM HCL 2 MG/2ML IJ SOLN
INTRAMUSCULAR | Status: AC
  Filled 2023-02-03: qty 2

## 2023-02-03 MED ORDER — ARMC OPHTHALMIC DILATING DROPS
1.0000 | OPHTHALMIC | Status: DC | PRN
  Administered 2023-02-03 (×3): 1 via OPHTHALMIC

## 2023-02-03 MED ORDER — FENTANYL CITRATE (PF) 100 MCG/2ML IJ SOLN
INTRAMUSCULAR | Status: AC
  Filled 2023-02-03: qty 2

## 2023-02-03 MED ORDER — FENTANYL CITRATE (PF) 100 MCG/2ML IJ SOLN
INTRAMUSCULAR | Status: DC | PRN
  Administered 2023-02-03: 25 ug via INTRAVENOUS
  Administered 2023-02-03: 50 ug via INTRAVENOUS
  Administered 2023-02-03: 25 ug via INTRAVENOUS

## 2023-02-03 MED ORDER — TETRACAINE HCL 0.5 % OP SOLN
1.0000 [drp] | OPHTHALMIC | Status: DC | PRN
  Administered 2023-02-03 (×3): 1 [drp] via OPHTHALMIC

## 2023-02-03 MED ORDER — SODIUM CHLORIDE 0.9% FLUSH
INTRAVENOUS | Status: DC | PRN
  Administered 2023-02-03 (×2): 10 mL via INTRAVENOUS

## 2023-02-03 MED ORDER — ARMC OPHTHALMIC DILATING DROPS
OPHTHALMIC | Status: AC
  Filled 2023-02-03: qty 0.5

## 2023-02-03 SURGICAL SUPPLY — 10 items
CATARACT SUITE SIGHTPATH (MISCELLANEOUS) ×1
FEE CATARACT SUITE SIGHTPATH (MISCELLANEOUS) ×1 IMPLANT
GLOVE SRG 8 PF TXTR STRL LF DI (GLOVE) ×1 IMPLANT
GLOVE SURG ENC TEXT LTX SZ7.5 (GLOVE) ×1 IMPLANT
GLOVE SURG UNDER POLY LF SZ8 (GLOVE) ×1
LENS CLAREON PAN OPTIX 20.0 ×1 IMPLANT
LENS IOL CLRN PAN 20.0 IMPLANT
NDL FILTER BLUNT 18X1 1/2 (NEEDLE) ×1 IMPLANT
NEEDLE FILTER BLUNT 18X1 1/2 (NEEDLE) ×1
SYR 3ML LL SCALE MARK (SYRINGE) ×1 IMPLANT

## 2023-02-03 NOTE — Anesthesia Preprocedure Evaluation (Signed)
Anesthesia Evaluation  Patient identified by MRN, date of birth, ID band Patient awake    Reviewed: Allergy & Precautions, H&P , NPO status , Patient's Chart, lab work & pertinent test results  History of Anesthesia Complications (+) history of anesthetic complications  Airway Mallampati: III  TM Distance: <3 FB Neck ROM: Full    Dental no notable dental hx.    Pulmonary neg pulmonary ROS   Pulmonary exam normal breath sounds clear to auscultation       Cardiovascular hypertension, + Peripheral Vascular Disease  negative cardio ROS Normal cardiovascular exam+ Valvular Problems/Murmurs  Rhythm:Regular Rate:Normal     Neuro/Psych   Anxiety      Neuromuscular disease negative neurological ROS  negative psych ROS   GI/Hepatic negative GI ROS, Neg liver ROS, hiatal hernia,GERD  ,,  Endo/Other  negative endocrine ROS    Renal/GU Renal diseasenegative Renal ROS  negative genitourinary   Musculoskeletal negative musculoskeletal ROS (+) Arthritis ,    Abdominal   Peds negative pediatric ROS (+)  Hematology negative hematology ROS (+)   Anesthesia Other Findings Previous cataract surgery 01-20-23  Medical History  Hypertension Chest pain Heart murmur Hiatal hernia OA (osteoarthritis) Hyperlipidemia Gout  Chronic low back pain Fatigue  Carpal tunnel syndrome Hip pain  Peripheral vascular disease (HCC) GERD (gastroesophageal reflux disease Complication of anesthesia--small mouth Diverticulitis Chest pain with high risk for cardiac etiology Primary osteoarthritis of first carpometacarpal joint of left hand Primary osteoarthritis of left hip Arthritis of left hip Piriformis syndrome of right side Low back pain (secondary) bilateral (right greater than left)  Basal cell carcinoma Actinic keratosis  History of heart attack Benign esophageal stricture  Anxiety    Reproductive/Obstetrics negative OB ROS                               Anesthesia Physical Anesthesia Plan  ASA: 3  Anesthesia Plan: MAC   Post-op Pain Management:    Induction: Intravenous  PONV Risk Score and Plan:   Airway Management Planned: Natural Airway and Nasal Cannula  Additional Equipment:   Intra-op Plan:   Post-operative Plan:   Informed Consent: I have reviewed the patients History and Physical, chart, labs and discussed the procedure including the risks, benefits and alternatives for the proposed anesthesia with the patient or authorized representative who has indicated his/her understanding and acceptance.     Dental Advisory Given  Plan Discussed with: Anesthesiologist, CRNA and Surgeon  Anesthesia Plan Comments: (Patient consented for risks of anesthesia including but not limited to:  - adverse reactions to medications - damage to eyes, teeth, lips or other oral mucosa - nerve damage due to positioning  - sore throat or hoarseness - Damage to heart, brain, nerves, lungs, other parts of body or loss of life  Patient voiced understanding and assent.)         Anesthesia Quick Evaluation

## 2023-02-03 NOTE — H&P (Signed)
[facility]   Primary Care Physician:  Dorcas Carrow, DO Ophthalmologist: Dr. Lockie Mola  Pre-Procedure History & Physical: HPI:  Tamara Fleming is a 77 y.o. female here for ophthalmic surgery.   Past Medical History:  Diagnosis Date   Actinic keratosis    Anxiety    Arthritis of left hip 02/17/2017   Basal cell carcinoma 10/30/2019   L chin. Superficial.   Benign esophageal stricture    Carpal tunnel syndrome 05/09/2014   Right   Chest pain    Chest pain with high risk for cardiac etiology 10/20/2010   Chronic low back pain    Complication of anesthesia    small mouth   Diverticulitis 02/17/2017   Fatigue    GERD (gastroesophageal reflux disease)    occ   Gout    Heart murmur    Hiatal hernia    Hip pain    Left   History of heart attack    Hyperlipidemia    Hypertension    Low back pain (secondary) bilateral (right greater than left) 09/30/2016   OA (osteoarthritis)    Peripheral vascular disease (HCC)    plhebitis 20 yrs ago   Piriformis syndrome of right side 02/17/2017   Primary osteoarthritis of first carpometacarpal joint of left hand 09/10/2016   Primary osteoarthritis of left hip 03/23/2017    Past Surgical History:  Procedure Laterality Date   ABDOMINAL HYSTERECTOMY  1982   Partial   APPENDECTOMY     ARM SURGERY Left    OF THE ULNA NERVE   BREAST EXCISIONAL BIOPSY Right    CARDIOVASCULAR STRESS TEST  08/11/2006   NORMAL. EF 55-60%   CARDIOVASCULAR STRESS TEST  10/31/1998   EF 60% AT REST TO 75-80% POST EXERCISE   CARPAL TUNNEL RELEASE Right 2015   CATARACT EXTRACTION W/PHACO Right 01/20/2023   Procedure: CATARACT EXTRACTION PHACO AND INTRAOCULAR LENS PLACEMENT (IOC) RIGHT  CLAREON PANOPTIC TORIC 12.87 01:02.9;  Surgeon: Lockie Mola, MD;  Location: [facility];  Service: Ophthalmology;  Laterality: Right;   ESOPHAGOGASTRODUODENOSCOPY  05/16/1990   ESOPHAGOGASTRODUODENOSCOPY (EGD) WITH PROPOFOL N/A 02/04/2015    Procedure: ESOPHAGOGASTRODUODENOSCOPY (EGD) WITH PROPOFOL;  Surgeon: Wallace Cullens, MD;  Location: [facility]COPY;  Service: Gastroenterology;  Laterality: N/A;   JOINT REPLACEMENT     REDUCTION MAMMAPLASTY  06/11/15   TOTAL HIP ARTHROPLASTY Left 04/13/2017   Procedure: TOTAL LEFT HIP ARTHROPLASTY ANTERIOR APPROACH;  Surgeon: Sheral Apley, MD;  Location: [facility] OR;  Service: Orthopedics;  Laterality: Left;    Prior to Admission medications   Medication Sig Start Date End Date Taking? Authorizing Provider  aspirin EC 81 MG tablet Take 81 mg by mouth daily.   Yes [provider]  Biotin 64332 MCG TABS Take by mouth at bedtime.   Yes [provider]  budesonide-formoterol (SYMBICORT) 160-4.5 MCG/ACT inhaler Inhale 2 puffs into the lungs 2 (two) times daily. 06/08/22  Yes Johnson, Megan P, DO  Cyanocobalamin (VITAMIN B-12 IJ) Inject as directed every 30 (thirty) days.   Yes [provider]  Dapsone 7.5 % GEL Apply to legs once a day 03/04/22  Yes Deirdre Evener, MD  DUREZOL 0.05 % EMUL Place 1 drop into the right eye 2 (two) times daily. 01/11/23  Yes [provider]  HYDROcodone-acetaminophen (NORCO/VICODIN) 5-325 MG tablet Take 1 tablet by mouth 4 (four) times daily as needed for moderate pain. 12/20/18  Yes Johnson, Megan P, DO  ILEVRO 0.3 % ophthalmic suspension SMARTSIG:In  Eye(s) 01/21/23  Yes [provider]  ketorolac (ACULAR) 0.5 % ophthalmic solution Place 1 drop into the right eye 4 (four) times daily. 01/11/23  Yes [provider]  losartan (COZAAR) 50 MG tablet Take 1 tablet (50 mg total) by mouth daily. 12/01/22  Yes Johnson, Megan P, DO  omeprazole (PRILOSEC OTC) 20 MG tablet Take 1 tablet (20 mg total) by mouth daily. 12/01/22  Yes Johnson, Megan P, DO  phentermine (ADIPEX-P) 37.5 MG tablet Take 37.5 mg by mouth daily before breakfast.   Yes [provider]  Ruxolitinib Phosphate (OPZELURA) 1.5 % CREA Apply topically to affected  areas qd for Granuloma annulare 08/27/22  Yes Deirdre Evener, MD  spironolactone (ALDACTONE) 25 MG tablet Take 1 tablet (25 mg total) by mouth daily. 12/01/22  Yes Johnson, Megan P, DO  ALPRAZolam Prudy Feeler) 0.5 MG tablet Take 30 minutes prior to flight Patient not taking: Reported on 01/12/2023 10/30/22   Olevia Perches P, DO  celecoxib (CELEBREX) 100 MG capsule 1 capsule with food Orally twice a day as needed for 30 days Patient not taking: Reported on 01/12/2023 09/24/22   [provider]  furosemide (LASIX) 20 MG tablet Take 1 tablet (20 mg total) by mouth daily. PRN for severe swelling Patient not taking: Reported on 01/12/2023 04/16/21   Olevia Perches P, DO  indomethacin (INDOCIN) 50 MG capsule Take 1 capsule (50 mg total) by mouth 3 (three) times daily with meals. For gout flare Patient not taking: Reported on 01/12/2023 06/08/22   Olevia Perches P, DO  triamcinolone ointment (KENALOG) 0.5 % Apply 1 Application topically 2 (two) times daily. Patient not taking: Reported on 02/01/2023 03/09/22   Olevia Perches P, DO    Allergies as of 12/15/2022 - Review Complete 12/01/2022  Allergen Reaction Noted   Ciprofloxacin Anaphylaxis 04/22/2011   Floxin [ofloxacin] Anaphylaxis 10/13/2010   Lipitor [atorvastatin] Other (See Comments) 12/01/2022   Sulfa antibiotics Diarrhea and Nausea And Vomiting 05/16/2015    Family History  Problem Relation Age of Onset   Coronary artery disease Mother    Cancer Father        Lung    Social History   Socioeconomic History   Marital status: Married    Spouse name: Not on file   Number of children: Not on file   Years of education: 24, 1 year college    Highest education level: Some college, no degree  Occupational History   Occupation: self employed   Tobacco Use   Smoking status: Never   Smokeless tobacco: Never  Vaping Use   Vaping status: Never Used  Substance and Sexual Activity   Alcohol use: Yes    Alcohol/week: 21.0 standard drinks of  alcohol    Types: 21 Glasses of wine per week   Drug use: Yes    Comment: prescribed hydrocodone   Sexual activity: Not Currently    Birth control/protection: None  Other Topics Concern   Not on file  Social History Narrative   Self employed   Social Determinants of Health   Financial Resource Strain: Low Risk  (10/05/2022)   Received from [facility]   Overall Financial Resource Strain (CARDIA)    Difficulty of Paying Living Expenses: Not hard at all  Food Insecurity: No Food Insecurity (10/05/2022)   Received from [facility]   Hunger Vital Sign    Worried About Running Out of Food in the Last Year: Never true    Ran Out of  Food in the Last Year: Never true  Transportation Needs: No Transportation Needs (10/05/2022)   Received from [facility]    In the past 12 months, has lack of transportation kept you from medical appointments or from getting medications?: No    Lack of Transportation (Non-Medical): No  Physical Activity: Inactive (12/31/2021)   Exercise Vital Sign    Days of Exercise per Week: 0 days    Minutes of Exercise per Session: 0 min  Stress: No Stress Concern Present (12/31/2021)   [employer] of Occupational Health - Occupational Stress Questionnaire    Feeling of Stress : Not at all  Social Connections: Moderately Integrated (12/31/2021)   Social Connection and Isolation Panel [NHANES]    Frequency of Communication with Friends and Family: More than three times a week    Frequency of Social Gatherings with Friends and Family: Three times a week    Attends Religious Services: More than 4 times per year    Active Member of Clubs or Organizations: No    Attends Banker Meetings: Never    Marital Status: Married  Catering manager Violence: Not At Risk (12/31/2021)   Humiliation, Afraid, Rape, and Kick questionnaire    Fear of Current or Ex-Partner: No    Emotionally  Abused: No    Physically Abused: No    Sexually Abused: No    Review of Systems: See HPI, otherwise negative ROS  Physical Exam: BP (!) 165/83   Pulse 68   Temp 97.9 F (36.6 C) (Temporal)   Resp 14   Ht 5\' 5"  (1.651 m)   Wt 76.3 kg   LMP  (LMP Unknown)   SpO2 99%   BMI 28.01 kg/m  General:   Alert,  pleasant and cooperative in NAD Head:  Normocephalic and atraumatic. Lungs:  Clear to auscultation.    Heart:  Regular rate and rhythm.   Impression/Plan: Tamara Fleming is here for ophthalmic surgery.  Risks, benefits, limitations, and alternatives regarding ophthalmic surgery have been reviewed with the patient.  Questions have been answered.  All parties agreeable.   Lockie Mola, MD  02/03/2023, 11:32 AM

## 2023-02-03 NOTE — Transfer of Care (Signed)
Immediate Anesthesia Transfer of Care Note  Patient: Tamara Fleming  Procedure(s) Performed: CATARACT EXTRACTION PHACO AND INTRAOCULAR LENS PLACEMENT (IOC) LEFT  CLAREON PANOPTIC 10.30 00:59.4 (Left)  Patient Location: PACU  Anesthesia Type:MAC  Level of Consciousness: awake, alert , and oriented  Airway & Oxygen Therapy: Patient Spontanous Breathing  Post-op Assessment: Report given to RN and Post -op Vital signs reviewed and stable  Post vital signs: Reviewed and stable  Last Vitals:  Vitals Value Taken Time  BP 124/73 02/03/23 1316  Temp 36.1 C 02/03/23 1315  Pulse 72 02/03/23 1318  Resp 16 02/03/23 1318  SpO2 95 % 02/03/23 1318  Vitals shown include unfiled device data.  Last Pain:  Vitals:   02/03/23 1315  TempSrc:   PainSc: 0-No pain         Complications: No notable events documented.

## 2023-02-03 NOTE — Anesthesia Postprocedure Evaluation (Signed)
Anesthesia Post Note  Patient: Tamara Fleming  Procedure(s) Performed: CATARACT EXTRACTION PHACO AND INTRAOCULAR LENS PLACEMENT (IOC) LEFT  CLAREON PANOPTIC 10.30 00:59.4 (Left)  Patient location during evaluation: PACU Anesthesia Type: MAC Level of consciousness: awake and alert Pain management: pain level controlled Vital Signs Assessment: post-procedure vital signs reviewed and stable Respiratory status: spontaneous breathing, nonlabored ventilation, respiratory function stable and patient connected to nasal cannula oxygen Cardiovascular status: stable and blood pressure returned to baseline Postop Assessment: no apparent nausea or vomiting Anesthetic complications: no   No notable events documented.   Last Vitals:  Vitals:   02/03/23 1315 02/03/23 1321  BP: 124/73 128/74  Pulse: 71 79  Resp: 13 14  Temp: (!) 36.1 C (!) 36.1 C  SpO2: 96% 96%    Last Pain:  Vitals:   02/03/23 1321  TempSrc:   PainSc: 0-No pain                 Veryl Abril C Burel Kahre

## 2023-02-03 NOTE — Op Note (Signed)
OPERATIVE NOTE  Tamara Fleming [id number] 02/03/2023   PREOPERATIVE DIAGNOSIS:  Nuclear sclerotic cataract left eye. H25.12   POSTOPERATIVE DIAGNOSIS:    Nuclear sclerotic cataract left eye.     PROCEDURE:  Phacoemusification with posterior chamber intraocular lens placement of the left eye  Ultrasound time: Procedure(s): CATARACT EXTRACTION PHACO AND INTRAOCULAR LENS PLACEMENT (IOC) LEFT  CLAREON PANOPTIC 10.30 00:59.4 (Left)  LENS:   Implant Name Type Inv. Item Serial No. Manufacturer Lot No. LRB No. Used Action  LENS CLAREON PAN OPTIX 20.0 - [id number]  LENS CLAREON PAN OPTIX 20.0 [id number] SIGHTPATH  Left 1 Implanted     [id number]  SURGEON:  Deirdre Evener, MD   ANESTHESIA:  Topical with tetracaine drops and 2% Xylocaine jelly, augmented with 1% preservative-free intracameral lidocaine.    COMPLICATIONS:  None.   DESCRIPTION OF PROCEDURE:  The patient was identified in the holding room and transported to the operating room and placed in the supine position under the operating microscope.  The left eye was identified as the operative eye and it was prepped and draped in the usual sterile ophthalmic fashion.   A 1 millimeter clear-corneal paracentesis was made at the 1:30 position.  0.5 ml of preservative-free 1% lidocaine was injected into the anterior chamber.  The anterior chamber was filled with Viscoat viscoelastic.  A 2.4 millimeter keratome was used to make a near-clear corneal incision at the 10:30 position.  .  A curvilinear capsulorrhexis was made with a cystotome and capsulorrhexis forceps.  Balanced salt solution was used to hydrodissect and hydrodelineate the nucleus.   Phacoemulsification was then used in stop and chop fashion to remove the lens nucleus and epinucleus.  The remaining cortex was then removed using the irrigation and aspiration handpiece. Provisc was then placed into the capsular bag to distend it for lens placement.  A lens was then injected  into the capsular bag.  The remaining viscoelastic was aspirated.   Wounds were hydrated with balanced salt solution.  The anterior chamber was inflated to a physiologic pressure with balanced salt solution.  No wound leaks were noted. Cefuroxime 0.1 ml of a 10mg /ml solution was injected into the anterior chamber for a dose of 1 mg of intracameral antibiotic at the completion of the case.   Timolol and Brimonidine drops were applied to the eye.  The patient was taken to the recovery room in stable condition without complications of anesthesia or surgery.  [name] 02/03/2023, 1:14 PM

## 2023-02-04 ENCOUNTER — Encounter: Payer: Self-pay | Admitting: Ophthalmology

## 2023-02-04 DIAGNOSIS — H33311 Horseshoe tear of retina without detachment, right eye: Secondary | ICD-10-CM | POA: Diagnosis not present

## 2023-02-04 NOTE — Plan of Care (Signed)
 [facility] Tonsillectomy/Adenoidectomy, Postoperative PEDS care plan entered in error.

## 2023-02-16 ENCOUNTER — Telehealth: Payer: Self-pay

## 2023-02-16 ENCOUNTER — Other Ambulatory Visit: Payer: Self-pay | Admitting: Family Medicine

## 2023-02-16 ENCOUNTER — Encounter: Payer: Self-pay | Admitting: Family Medicine

## 2023-02-16 DIAGNOSIS — M47814 Spondylosis without myelopathy or radiculopathy, thoracic region: Secondary | ICD-10-CM | POA: Diagnosis not present

## 2023-02-16 DIAGNOSIS — N6320 Unspecified lump in the left breast, unspecified quadrant: Secondary | ICD-10-CM

## 2023-02-16 DIAGNOSIS — G8929 Other chronic pain: Secondary | ICD-10-CM | POA: Diagnosis not present

## 2023-02-16 DIAGNOSIS — M542 Cervicalgia: Secondary | ICD-10-CM | POA: Diagnosis not present

## 2023-02-16 DIAGNOSIS — Z1231 Encounter for screening mammogram for malignant neoplasm of breast: Secondary | ICD-10-CM

## 2023-02-16 DIAGNOSIS — Z0389 Encounter for observation for other suspected diseases and conditions ruled out: Secondary | ICD-10-CM

## 2023-02-16 DIAGNOSIS — Z79891 Long term (current) use of opiate analgesic: Secondary | ICD-10-CM | POA: Diagnosis not present

## 2023-02-16 DIAGNOSIS — M546 Pain in thoracic spine: Secondary | ICD-10-CM | POA: Diagnosis not present

## 2023-02-16 DIAGNOSIS — N63 Unspecified lump in unspecified breast: Secondary | ICD-10-CM

## 2023-02-16 DIAGNOSIS — M5412 Radiculopathy, cervical region: Secondary | ICD-10-CM | POA: Diagnosis not present

## 2023-02-16 NOTE — Telephone Encounter (Signed)
Orders signed.

## 2023-02-24 ENCOUNTER — Ambulatory Visit: Payer: Medicare Other | Admitting: Dermatology

## 2023-02-24 DIAGNOSIS — L821 Other seborrheic keratosis: Secondary | ICD-10-CM | POA: Diagnosis not present

## 2023-02-24 DIAGNOSIS — L578 Other skin changes due to chronic exposure to nonionizing radiation: Secondary | ICD-10-CM

## 2023-02-24 DIAGNOSIS — Z872 Personal history of diseases of the skin and subcutaneous tissue: Secondary | ICD-10-CM

## 2023-02-24 DIAGNOSIS — L853 Xerosis cutis: Secondary | ICD-10-CM

## 2023-02-24 DIAGNOSIS — D239 Other benign neoplasm of skin, unspecified: Secondary | ICD-10-CM

## 2023-02-24 DIAGNOSIS — D229 Melanocytic nevi, unspecified: Secondary | ICD-10-CM

## 2023-02-24 DIAGNOSIS — L820 Inflamed seborrheic keratosis: Secondary | ICD-10-CM

## 2023-02-24 DIAGNOSIS — Z79899 Other long term (current) drug therapy: Secondary | ICD-10-CM

## 2023-02-24 DIAGNOSIS — L209 Atopic dermatitis, unspecified: Secondary | ICD-10-CM | POA: Diagnosis not present

## 2023-02-24 DIAGNOSIS — I781 Nevus, non-neoplastic: Secondary | ICD-10-CM | POA: Diagnosis not present

## 2023-02-24 DIAGNOSIS — I8393 Asymptomatic varicose veins of bilateral lower extremities: Secondary | ICD-10-CM

## 2023-02-24 DIAGNOSIS — D1801 Hemangioma of skin and subcutaneous tissue: Secondary | ICD-10-CM

## 2023-02-24 DIAGNOSIS — L920 Granuloma annulare: Secondary | ICD-10-CM

## 2023-02-24 DIAGNOSIS — L605 Yellow nail syndrome: Secondary | ICD-10-CM

## 2023-02-24 DIAGNOSIS — Z85828 Personal history of other malignant neoplasm of skin: Secondary | ICD-10-CM

## 2023-02-24 DIAGNOSIS — Z1283 Encounter for screening for malignant neoplasm of skin: Secondary | ICD-10-CM

## 2023-02-24 DIAGNOSIS — W908XXA Exposure to other nonionizing radiation, initial encounter: Secondary | ICD-10-CM | POA: Diagnosis not present

## 2023-02-24 DIAGNOSIS — L905 Scar conditions and fibrosis of skin: Secondary | ICD-10-CM

## 2023-02-24 DIAGNOSIS — L814 Other melanin hyperpigmentation: Secondary | ICD-10-CM

## 2023-02-24 DIAGNOSIS — R230 Cyanosis: Secondary | ICD-10-CM | POA: Diagnosis not present

## 2023-02-24 DIAGNOSIS — Z7189 Other specified counseling: Secondary | ICD-10-CM

## 2023-02-24 MED ORDER — OPZELURA 1.5 % EX CREA: TOPICAL_CREAM | CUTANEOUS | 1 refills | Status: DC

## 2023-02-24 NOTE — Patient Instructions (Addendum)

## 2023-02-24 NOTE — Progress Notes (Signed)
Follow-Up Visit   Subjective  Tamara Fleming is a 77 y.o. female who presents for the following: Skin Cancer Screening and Full Body Skin Exam Hx of bcc, hx of isks,  Noticed a place that feels has changed on right lower leg a week ago. Also concerned with circulation and discoloration at feet. Reports some cracking and peeling at tips of toes.   The patient presents for Total-Body Skin Exam (TBSE) for skin cancer screening and mole check. The patient has spots, moles and lesions to be evaluated, some may be new or changing and the patient may have concern these could be cancer.    The following portions of the chart were reviewed this encounter and updated as appropriate: medications, allergies, medical history  Review of Systems:  No other skin or systemic complaints except as noted in HPI or Assessment and Plan.  Objective  Well appearing patient in no apparent distress; mood and affect are within normal limits.  A full examination was performed including scalp, head, eyes, ears, nose, lips, neck, chest, axillae, abdomen, back, buttocks, bilateral upper extremities, bilateral lower extremities, hands, feet, fingers, toes, fingernails, and toenails. All findings within normal limits unless otherwise noted below.   Relevant physical exam findings are noted in the Assessment and Plan.  right chest x 1, b/l leg x 21, right forearm x 1 (23) Erythematous stuck-on, waxy papule or plaque           Assessment & Plan   SKIN CANCER SCREENING PERFORMED TODAY.  ACTINIC DAMAGE - Chronic condition, secondary to cumulative UV/sun exposure - diffuse scaly erythematous macules with underlying dyspigmentation - Recommend daily broad spectrum sunscreen SPF 30+ to sun-exposed areas, reapply every 2 hours as needed.  - Staying in the shade or wearing long sleeves, sun glasses (UVA+UVB protection) and wide brim hats (4-inch brim around the entire circumference of the hat) are also  recommended for sun protection.  - Call for new or changing lesions.  LENTIGINES, SEBORRHEIC KERATOSES, HEMANGIOMAS - Benign normal skin lesions - Benign-appearing - Call for any changes  MELANOCYTIC NEVI - Tan-brown and/or pink-flesh-colored symmetric macules and papules - Benign appearing on exam today - Observation - Call clinic for new or changing moles - Recommend daily use of broad spectrum spf 30+ sunscreen to sun-exposed areas.   Varicose Veins/Spider Veins - Dilated blue, purple or red veins at the lower extremities - Reassured - Smaller vessels can be treated by sclerotherapy (a procedure to inject a medicine into the veins to make them disappear) if desired, but the treatment is not covered by insurance. Larger vessels may be covered if symptomatic and we would refer to vascular surgeon if treatment desired.  Granuloma annulare B/L leg Exam :  Chronic and persistent condition with duration or expected duration over one year. Condition is symptomatic / bothersome to patient. Not to goal. Continue Dapsone 7.5% gel QD.  Did not have samples of Opzelura,  Continue Opzelura cream - apply topically to affected areas daily. Rx sent to [facility].    Patient advised to send mychart if not covered.    Benign-appearing.  Observation.  Call clinic for new or changing lesions.  Recommend daily use of broad spectrum spf 30+ sunscreen to sun-exposed areas.  DERMATOFIBROMA Exam: Firm pink/brown papulenodule with dimple sign. Treatment Plan: A dermatofibroma is a benign growth possibly related to trauma, such as an insect bite, cut from shaving, or inflamed acne-type bump.  Treatment options to remove include shave or excision with  resulting scar and risk of recurrence.  Since benign-appearing and not bothersome, will observe for now.    Indentation at right cheek Hx of bx by Dr. Gertie Baron  which was normal  No history of trauma  > than 30 years ago Recommend no  treatment Discussed face lift   Purple skin of feet due to cool air. Less likely Raynaud's phenomenon vs Other at feet  Exam: see photos  Purple color of feet - vascular changes - normal variant Treatment Plan: Recommend follow up with primary to evaluate  Appears to be normal variant - asymptomatic Reassured.   ATOPIC DERMATITIS with fissures at b/l feet  Exam: Scaling and fissures at b/l feet  3% BSA Atopic dermatitis (eczema) is a chronic, relapsing, pruritic condition that can significantly affect quality of life. It is often associated with allergic rhinitis and/or asthma and can require treatment with topical medications, phototherapy, or in severe cases biologic injectable medication (Dupixent; Adbry) or Oral JAK inhibitors.  Treatment Plan: Can start Opzelura cream to affected areas of feet daily.   Recommend gentle skin care.  HISTORY OF BASAL CELL CARCINOMA OF THE SKIN Left chin 10/30/19 - No evidence of recurrence today - Recommend regular full body skin exams - Recommend daily broad spectrum sunscreen SPF 30+ to sun-exposed areas, reapply every 2 hours as needed.  - Call if any new or changing lesions are noted between office visits  Mildly yellowed toenails Appears to be normal variant Will recheck next visit Reassured. Not likely pulmonary associated.  Inflamed seborrheic keratosis (23) right chest x 1, b/l leg x 21, right forearm x 1  Symptomatic, irritating, patient would like treated.  Destruction of lesion - right chest x 1, b/l leg x 21, right forearm x 1 (23) Complexity: simple   Destruction method: cryotherapy   Informed consent: discussed and consent obtained   Timeout:  patient name, date of birth, surgical site, and procedure verified Lesion destroyed using liquid nitrogen: Yes   Region frozen until ice ball extended beyond lesion: Yes   Outcome: patient tolerated procedure well with no complications   Post-procedure details: wound care  instructions given    Granuloma annulare  Related Medications Dapsone 7.5 % GEL Apply to legs once a day  Ruxolitinib Phosphate (OPZELURA) 1.5 % CREA Apply topically to affected areas qd for Granuloma annulare  Actinic skin damage  Skin cancer screening  Lentigo  Melanocytic nevus, unspecified location  History of basal cell carcinoma  Medication management  Counseling and coordination of care  Xerosis cutis  Yellow nails   Return in about 1 year (around 02/24/2024) for TBSE.  IAsher Muir, CMA, am acting as scribe for Armida Sans, MD.   Documentation: I have reviewed the above documentation for accuracy and completeness, and I agree with the above.  Armida Sans, MD

## 2023-02-28 ENCOUNTER — Encounter: Payer: Self-pay | Admitting: Dermatology

## 2023-03-01 ENCOUNTER — Ambulatory Visit
Admission: RE | Admit: 2023-03-01 | Discharge: 2023-03-01 | Disposition: A | Discharge status: Home / Self Care | Payer: Medicare Other | Source: Ambulatory Visit | Attending: Family Medicine

## 2023-03-01 DIAGNOSIS — N63 Unspecified lump in unspecified breast: Secondary | ICD-10-CM

## 2023-03-01 DIAGNOSIS — N6489 Other specified disorders of breast: Secondary | ICD-10-CM | POA: Diagnosis not present

## 2023-03-01 DIAGNOSIS — Z0389 Encounter for observation for other suspected diseases and conditions ruled out: Secondary | ICD-10-CM

## 2023-03-01 DIAGNOSIS — R2232 Localized swelling, mass and lump, left upper limb: Secondary | ICD-10-CM | POA: Diagnosis not present

## 2023-03-10 ENCOUNTER — Other Ambulatory Visit: Payer: Self-pay | Admitting: Family Medicine

## 2023-03-12 NOTE — Telephone Encounter (Signed)
Rx was sent to [facility] on 12/01/22 #90/1  Requested Prescriptions  Pending Prescriptions Disp Refills   losartan (COZAAR) 50 MG tablet [Pharmacy Med Name: LOSARTAN POTASSIUM 50 MG TAB] 90 tablet 1    Sig: TAKE 1 TABLET BY MOUTH ONCE DAILY     Cardiovascular:  Angiotensin Receptor Blockers Passed - 03/10/2023 12:21 PM      Passed - Cr in normal range and within 180 days    Creatinine  Date Value Ref Range Status  09/05/2012 0.74 0.60 - 1.30 mg/dL Final   Creat  Date Value Ref Range Status  02/26/2012 0.75 0.50 - 1.10 mg/dL Final   Creatinine, Ser  Date Value Ref Range Status  02/01/2023 0.85 0.57 - 1.00 mg/dL Final         Passed - K in normal range and within 180 days    Potassium  Date Value Ref Range Status  02/01/2023 4.3 3.5 - 5.2 mmol/L Final  09/05/2012 3.9 3.5 - 5.1 mmol/L Final         Passed - Patient is not pregnant      Passed - Last BP in normal range    BP Readings from Last 1 Encounters:  02/03/23 128/74         Passed - Valid encounter within last 6 months    Recent Outpatient Visits           1 month ago Pure hypercholesterolemia   [place] [facility] [place], [place], DO   3 months ago Pure hypercholesterolemia   [place] [facility] [place], [place], DO   4 months ago Myalgia   [place] [facility] [place], [place], DO   9 months ago Essential hypertension   [place] [facility] [place], [place], DO   10 months ago Bruise   [place] [facility] [place], [place], DO       Future Appointments             In 2 months Laural Benes, Oralia Rud, DO [facility] [place]   In 11 months Deirdre Evener, MD [facility]

## 2023-03-17 DIAGNOSIS — M542 Cervicalgia: Secondary | ICD-10-CM | POA: Diagnosis not present

## 2023-03-17 DIAGNOSIS — M5412 Radiculopathy, cervical region: Secondary | ICD-10-CM | POA: Diagnosis not present

## 2023-03-17 DIAGNOSIS — M47814 Spondylosis without myelopathy or radiculopathy, thoracic region: Secondary | ICD-10-CM | POA: Diagnosis not present

## 2023-03-17 DIAGNOSIS — M546 Pain in thoracic spine: Secondary | ICD-10-CM | POA: Diagnosis not present

## 2023-03-17 DIAGNOSIS — T148XXA Other injury of unspecified body region, initial encounter: Secondary | ICD-10-CM | POA: Diagnosis not present

## 2023-03-17 DIAGNOSIS — G8929 Other chronic pain: Secondary | ICD-10-CM | POA: Diagnosis not present

## 2023-03-17 DIAGNOSIS — Z79891 Long term (current) use of opiate analgesic: Secondary | ICD-10-CM | POA: Diagnosis not present

## 2023-04-12 ENCOUNTER — Ambulatory Visit: Payer: Self-pay | Admitting: *Deleted

## 2023-04-12 NOTE — Telephone Encounter (Signed)
  Chief Complaint: fell out of car on left shoulder attempted to catch self with left arm approx 2 hours ago now severe pain Symptoms: fell getting out of car with hands full of packages tried to catch self and hit left shoulder. Now red turning purple swelling cool compress applied. Denies hitting head . Unsure of hitting anything else due to pain in shoulder . Can move fingers no swelling below elbow or hand. C/o chest soreness  Frequency: 2 hours ago  Pertinent Negatives: Patient denies hitting head  Disposition: [] ED /[] Urgent Care (no appt availability in office) / [] Appointment(In office/virtual)/ []  [facility]/ [] Home Care/ [x] Refused Recommended Disposition /[] [facility]/ []  Follow-up with PCP Additional Notes:   Recommended UC/ED and patient would rather be seen by PCP or have xray ordered. Patient is to leave on cruise in 3 days . Please advise patient requesting call back with PCP recommendation.           Reason for Disposition  Patient sounds very sick or weak to the triager  Answer Assessment - Initial Assessment Questions 1. MECHANISM: How did the fall happen?     Clemens out of car with hands full 2. DOMESTIC VIOLENCE AND ELDER ABUSE SCREENING: Did you fall because someone pushed you or tried to hurt you? If Yes, ask: Are you safe now?     na 3. ONSET: When did the fall happen? (e.g., minutes, hours, or days ago)     2 hours  4. LOCATION: What part of the body hit the ground? (e.g., back, buttocks, head, hips, knees, hands, head, stomach)     Left shoulder  5. INJURY: Did you hurt (injure) yourself when you fell? If Yes, ask: What did you injure? Tell me more about this? (e.g., body area; type of injury; pain severity)     Yes fell on left shoulder 6. PAIN: Is there any pain? If Yes, ask: How bad is the pain? (e.g., Scale 1-10; or mild,  moderate, severe)   - NONE (0): No pain   - MILD (1-3): Doesn't interfere with normal  activities    - MODERATE (4-7): Interferes with normal activities or awakens from sleep    - SEVERE (8-10): Excruciating pain, unable to do any normal activities      Severe when touching shoulder 7. SIZE: For cuts, bruises, or swelling, ask: How large is it? (e.g., inches or centimeters)      Bruised with swelling  8. PREGNANCY: Is there any chance you are pregnant? When was your last menstrual period?     na 9. OTHER SYMPTOMS: Do you have any other symptoms? (e.g., dizziness, fever, weakness; new onset or worsening).      Clemens getting out of car due to slippery rain had hands full of packages used arm to try to catch fall  10. CAUSE: What do you think caused the fall (or falling)? (e.g., tripped, dizzy spell)       slipped  Protocols used: Falls and [facility]

## 2023-04-13 NOTE — Telephone Encounter (Signed)
 Called patient to schedule appt with a different provider left message on machine//iap

## 2023-04-13 NOTE — Telephone Encounter (Signed)
 I do not have anything available in the next 3 days- does anyone else?

## 2023-04-14 NOTE — Telephone Encounter (Signed)
 FYI......SABRACalled patient and offered her an appointment for 04/14/2023, she stated that she will be going out of town tomorrow.  She said that she hopes will be okay.  Went to pain clinic yesterday and they evaluated her and stated that they do not think the shoulder is broken.  However, she stated that she couldn't ruin everyone else's plans due to her falling.  She stated that she couldn't believe that the provider would not just order her an Xray, I explained to her that the provider has to be able to assess and evaluate her in order for her to be able to place an order for an Xray.  She just stated that she hopes she will be okay.

## 2023-06-04 ENCOUNTER — Ambulatory Visit: Payer: Self-pay | Admitting: Family Medicine

## 2023-06-10 ENCOUNTER — Ambulatory Visit: Payer: Medicare Other | Admitting: Family Medicine

## 2023-06-10 ENCOUNTER — Other Ambulatory Visit: Payer: Self-pay

## 2023-06-10 ENCOUNTER — Encounter: Payer: Self-pay | Admitting: Family Medicine

## 2023-06-10 VITALS — BP 138/72 | HR 67 | Temp 98.1°F | Resp 15 | Wt 169.0 lb

## 2023-06-10 DIAGNOSIS — E559 Vitamin D deficiency, unspecified: Secondary | ICD-10-CM

## 2023-06-10 DIAGNOSIS — F419 Anxiety disorder, unspecified: Secondary | ICD-10-CM

## 2023-06-10 DIAGNOSIS — E538 Deficiency of other specified B group vitamins: Secondary | ICD-10-CM

## 2023-06-10 DIAGNOSIS — E7800 Pure hypercholesterolemia, unspecified: Secondary | ICD-10-CM

## 2023-06-10 DIAGNOSIS — R7301 Impaired fasting glucose: Secondary | ICD-10-CM | POA: Diagnosis not present

## 2023-06-10 DIAGNOSIS — R82998 Other abnormal findings in urine: Secondary | ICD-10-CM

## 2023-06-10 DIAGNOSIS — G720 Drug-induced myopathy: Secondary | ICD-10-CM

## 2023-06-10 DIAGNOSIS — D692 Other nonthrombocytopenic purpura: Secondary | ICD-10-CM

## 2023-06-10 DIAGNOSIS — I10 Essential (primary) hypertension: Secondary | ICD-10-CM | POA: Diagnosis not present

## 2023-06-10 DIAGNOSIS — T466X5A Adverse effect of antihyperlipidemic and antiarteriosclerotic drugs, initial encounter: Secondary | ICD-10-CM

## 2023-06-10 DIAGNOSIS — M1A9XX0 Chronic gout, unspecified, without tophus (tophi): Secondary | ICD-10-CM

## 2023-06-10 DIAGNOSIS — R202 Paresthesia of skin: Secondary | ICD-10-CM

## 2023-06-10 LAB — MICROSCOPIC EXAMINATION
Epithelial Cells (non renal): >10 /HPF — AB (ref 0–10)
RBC, Urine: NONE SEEN /HPF (ref 0–2)

## 2023-06-10 LAB — URINALYSIS, ROUTINE W REFLEX MICROSCOPIC
Bilirubin, UA: NEGATIVE
Glucose, UA: NEGATIVE
Ketones, UA: NEGATIVE
Nitrite, UA: NEGATIVE
Protein,UA: NEGATIVE
RBC, UA: NEGATIVE
Specific Gravity, UA: 1.02 (ref 1.005–1.030)
Urobilinogen, Ur: 1 mg/dL (ref 0.2–1.0)
pH, UA: 6 (ref 5.0–7.5)

## 2023-06-10 LAB — MICROALBUMIN, URINE WAIVED
Creatinine, Urine Waived: 300 mg/dL (ref 10–300)
Microalb, Ur Waived: 30 mg/L — ABNORMAL HIGH (ref 0–19)
Microalb/Creat Ratio: <30 mg/g (ref <30)

## 2023-06-10 LAB — BAYER DCA HB A1C WAIVED: HB A1C (BAYER DCA - WAIVED): 5.5 % (ref 4.8–5.6)

## 2023-06-10 MED ORDER — LOSARTAN POTASSIUM 50 MG PO TABS: 50.0000 mg | ORAL_TABLET | Freq: Every day | ORAL | 1 refills | Status: DC

## 2023-06-10 MED ORDER — BUDESONIDE-FORMOTEROL FUMARATE 160-4.5 MCG/ACT IN AERO: 2.0000 | INHALATION_SPRAY | Freq: Two times a day (BID) | RESPIRATORY_TRACT | 1 refills | Status: AC

## 2023-06-10 MED ORDER — SPIRONOLACTONE 25 MG PO TABS: 25.0000 mg | ORAL_TABLET | Freq: Every day | ORAL | 1 refills | Status: DC

## 2023-06-10 MED ORDER — CYANOCOBALAMIN 1000 MCG/ML IJ SOLN: 1000.0000 ug | INTRAMUSCULAR | Status: AC

## 2023-06-10 MED ORDER — CYANOCOBALAMIN 1000 MCG/ML IJ SOLN
1000.0000 ug | Freq: Once | INTRAMUSCULAR | Status: AC
  Administered 2023-06-10: 1000 ug via INTRAMUSCULAR

## 2023-06-10 MED ORDER — BUDESONIDE-FORMOTEROL FUMARATE 160-4.5 MCG/ACT IN AERO: 2.0000 | INHALATION_SPRAY | Freq: Two times a day (BID) | RESPIRATORY_TRACT | 3 refills | Status: DC

## 2023-06-10 MED ORDER — OMEPRAZOLE MAGNESIUM 20 MG PO TBEC: 40.0000 mg | DELAYED_RELEASE_TABLET | Freq: Every day | ORAL | 1 refills | Status: DC

## 2023-06-10 NOTE — Assessment & Plan Note (Signed)
Rechecking labs. Await results. Treat as needed.  

## 2023-06-10 NOTE — Assessment & Plan Note (Signed)
 Reassured patient. Continue to monitor.

## 2023-06-10 NOTE — Assessment & Plan Note (Signed)
 Rechecking labs today. Await results. Treat as needed.

## 2023-06-10 NOTE — Assessment & Plan Note (Signed)
 Unable to tolerate statins. Rechecking labs. Await results. Treat as needed.
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
## 2023-06-10 NOTE — Assessment & Plan Note (Signed)
Unable to tolerate statins. Continue to monitor. Call with any concerns.  

## 2023-06-10 NOTE — Assessment & Plan Note (Addendum)
Under good control on current regimen. Continue current regimen. Continue to monitor. Call with any concerns.   

## 2023-06-10 NOTE — Assessment & Plan Note (Signed)
 Under good control on current regimen. Continue current regimen. Continue to monitor. Call with any concerns. Refills given. Labs drawn today.

## 2023-06-10 NOTE — Patient Instructions (Addendum)
 coricidin  Please call to schedule your mammogram and/or bone density: [facility]  Address: [address], [place], [place] [address] Phone: [phone]  [facility] [address] [place],  [place]  [address] Phone: [phone]

## 2023-06-10 NOTE — Telephone Encounter (Signed)
 RX t'd up for the provider.    Copied from CRM [id number]. Topic: Clinical - Medication Question >> Jun 10, 2023 11:16 AM Almira Coaster wrote: Reason for CRM: [facility] is calling because they need a new prescription for budesonide-formoterol (SYMBICORT) 160-4.5 MCG/ACT inhaler . The quantity on the original prescription is incorrect and needs to state quantity of 30.6.

## 2023-06-11 LAB — COMPREHENSIVE METABOLIC PANEL
ALT: 23 IU/L (ref 0–32)
AST: 33 IU/L (ref 0–40)
Albumin: 4.4 g/dL (ref 3.8–4.8)
Alkaline Phosphatase: 91 IU/L (ref 44–121)
BUN/Creatinine Ratio: 12 (ref 12–28)
BUN: 10 mg/dL (ref 8–27)
Bilirubin Total: 1 mg/dL (ref 0.0–1.2)
CO2: 23 mmol/L (ref 20–29)
Calcium: 9.7 mg/dL (ref 8.7–10.3)
Chloride: 101 mmol/L (ref 96–106)
Creatinine, Ser: 0.81 mg/dL (ref 0.57–1.00)
Globulin, Total: 2.4 g/dL (ref 1.5–4.5)
Glucose: 111 mg/dL — ABNORMAL HIGH (ref 70–99)
Potassium: 4.5 mmol/L (ref 3.5–5.2)
Sodium: 141 mmol/L (ref 134–144)
Total Protein: 6.8 g/dL (ref 6.0–8.5)
eGFR: 75 mL/min/{1.73_m2} (ref >59)

## 2023-06-11 LAB — CBC WITH DIFFERENTIAL/PLATELET
Basophils Absolute: 0 10*3/uL (ref 0.0–0.2)
Basos: 1 %
EOS (ABSOLUTE): 0.1 10*3/uL (ref 0.0–0.4)
Eos: 3 %
Hematocrit: 42.4 % (ref 34.0–46.6)
Hemoglobin: 14.8 g/dL (ref 11.1–15.9)
Immature Grans (Abs): 0 10*3/uL (ref 0.0–0.1)
Immature Granulocytes: 0 %
Lymphocytes Absolute: 1.2 10*3/uL (ref 0.7–3.1)
Lymphs: 28 %
MCH: 35.7 pg — ABNORMAL HIGH (ref 26.6–33.0)
MCHC: 34.9 g/dL (ref 31.5–35.7)
MCV: 102 fL — ABNORMAL HIGH (ref 79–97)
Monocytes Absolute: 0.6 10*3/uL (ref 0.1–0.9)
Monocytes: 16 %
Neutrophils Absolute: 2.2 10*3/uL (ref 1.4–7.0)
Neutrophils: 52 %
Platelets: 141 10*3/uL — ABNORMAL LOW (ref 150–450)
RBC: 4.15 x10E6/uL (ref 3.77–5.28)
RDW: 11.7 % (ref 11.7–15.4)
WBC: 4.1 10*3/uL (ref 3.4–10.8)

## 2023-06-11 LAB — LIPID PANEL W/O CHOL/HDL RATIO
Cholesterol, Total: 228 mg/dL — ABNORMAL HIGH (ref 100–199)
HDL: 68 mg/dL (ref >39)
LDL Chol Calc (NIH): 140 mg/dL — ABNORMAL HIGH (ref 0–99)
Triglycerides: 112 mg/dL (ref 0–149)
VLDL Cholesterol Cal: 20 mg/dL (ref 5–40)

## 2023-06-11 LAB — TSH: TSH: 2.69 u[IU]/mL (ref 0.450–4.500)

## 2023-06-11 LAB — VITAMIN B12: Vitamin B-12: 325 pg/mL (ref 232–1245)

## 2023-06-11 LAB — VITAMIN D 25 HYDROXY (VIT D DEFICIENCY, FRACTURES): Vit D, 25-Hydroxy: 21.9 ng/mL — ABNORMAL LOW (ref 30.0–100.0)

## 2023-06-15 ENCOUNTER — Other Ambulatory Visit: Payer: Self-pay | Admitting: Family Medicine

## 2023-06-15 ENCOUNTER — Encounter: Payer: Self-pay | Admitting: Family Medicine

## 2023-06-15 MED ORDER — VITAMIN D (ERGOCALCIFEROL) 1.25 MG (50000 UNIT) PO CAPS: 50000.0000 [IU] | ORAL_CAPSULE | ORAL | 1 refills | Status: DC

## 2023-07-22 ENCOUNTER — Encounter: Payer: Self-pay | Admitting: Family Medicine

## 2023-07-22 ENCOUNTER — Ambulatory Visit (INDEPENDENT_AMBULATORY_CARE_PROVIDER_SITE_OTHER): Admitting: Family Medicine

## 2023-07-22 VITALS — BP 133/81 | HR 62 | Ht 65.0 in | Wt 170.8 lb

## 2023-07-22 DIAGNOSIS — M4722 Other spondylosis with radiculopathy, cervical region: Secondary | ICD-10-CM

## 2023-07-22 DIAGNOSIS — I10 Essential (primary) hypertension: Secondary | ICD-10-CM | POA: Diagnosis not present

## 2023-07-22 DIAGNOSIS — E663 Overweight: Secondary | ICD-10-CM | POA: Diagnosis not present

## 2023-07-22 DIAGNOSIS — R7301 Impaired fasting glucose: Secondary | ICD-10-CM

## 2023-07-22 DIAGNOSIS — Z6828 Body mass index (BMI) 28.0-28.9, adult: Secondary | ICD-10-CM

## 2023-07-22 DIAGNOSIS — E7800 Pure hypercholesterolemia, unspecified: Secondary | ICD-10-CM

## 2023-07-22 MED ORDER — TIRZEPATIDE-WEIGHT MANAGEMENT 2.5 MG/0.5ML ~~LOC~~ SOLN: 2.5000 mg | SUBCUTANEOUS | 1 refills | Status: DC

## 2023-07-22 MED ORDER — GABAPENTIN 100 MG PO CAPS: 100.0000 mg | ORAL_CAPSULE | Freq: Three times a day (TID) | ORAL | 3 refills | Status: DC

## 2023-07-22 NOTE — Assessment & Plan Note (Signed)
 To have MRI. Will start her on gabapentin. Call with any concerns. Recheck in 6 months.

## 2023-07-22 NOTE — Progress Notes (Signed)
 BP 133/81 (BP Location: Left Arm, Patient Position: Sitting, Cuff Size: Normal)   Pulse 62   Ht 5\' 5"  (1.651 m)   Wt 170 lb 12.8 oz (77.5 kg)   LMP  (LMP Unknown)   SpO2 98%   BMI 28.42 kg/m    Subjective:    Patient ID: Tamara Fleming, female    DOB: 09-21-1945, 78 y.o.   MRN: [id number]  HPI: Tamara Fleming is a 78 y.o. female  Chief Complaint  Patient presents with   Numbness    Right pinky finger radiates up the right with pain.    Neck Pain   NUMBNESS Duration: about 3 months Onset: sudden Location: Fingers 4 and 5 on both sides, R significantly worse than the L Bilateral: yes Symmetric: no Decreased sensation: yes  Weakness: yes Pain: yes Quality:  numb and tingling, burning Severity: severe  Frequency: constant, waxing and waning Trauma: yes Recent illness: yes Diabetes: no Thyroid disease: no  HIV: no  Alcoholism: no  Spinal cord injury: no Status: worse Treatments attempted:   OVERWEIGHT Duration: chronic Previous attempts at weight loss: yes Complications of obesity: HTN, IFG, HLD Peak weight: 184lbs (current 170lbs) Weight loss goal: 25 lbs Weight loss to date: 14lbs Requesting obesity pharmacotherapy: yes Current weight loss supplements/medications: no Previous weight loss supplements/meds: yes- phentermine  Relevant past medical, surgical, family and social history reviewed and updated as indicated. Interim medical history since our last visit reviewed. Allergies and medications reviewed and updated.  Review of Systems  Constitutional: Negative.   Respiratory: Negative.    Cardiovascular: Negative.   Musculoskeletal: Negative.   Skin: Negative.   Neurological:  Positive for weakness. Negative for dizziness, tremors, seizures, syncope, facial asymmetry, speech difficulty, light-headedness, numbness and headaches.  Psychiatric/Behavioral: Negative.      Per HPI unless specifically indicated above     Objective:    BP 133/81 (BP  Location: Left Arm, Patient Position: Sitting, Cuff Size: Normal)   Pulse 62   Ht 5\' 5"  (1.651 m)   Wt 170 lb 12.8 oz (77.5 kg)   LMP  (LMP Unknown)   SpO2 98%   BMI 28.42 kg/m   Wt Readings from Last 3 Encounters:  07/22/23 170 lb 12.8 oz (77.5 kg)  06/10/23 169 lb (76.7 kg)  02/03/23 168 lb 4.8 oz (76.3 kg)    Physical Exam Vitals and nursing note reviewed.  Constitutional:      General: She is not in acute distress.    Appearance: Normal appearance. She is not ill-appearing, toxic-appearing or diaphoretic.  HENT:     Head: Normocephalic and atraumatic.     Right Ear: Tympanic membrane, ear canal and external ear normal. There is no impacted cerumen.     Left Ear: Tympanic membrane, ear canal and external ear normal. There is no impacted cerumen.     Nose: Nose normal. No congestion or rhinorrhea.     Mouth/Throat:     Mouth: Mucous membranes are moist.     Pharynx: Oropharynx is clear. No oropharyngeal exudate or posterior oropharyngeal erythema.  Eyes:     General: No scleral icterus.       Right eye: No discharge.        Left eye: No discharge.     Extraocular Movements: Extraocular movements intact.     Conjunctiva/sclera: Conjunctivae normal.     Pupils: Pupils are equal, round, and reactive to light.  Neck:     Vascular: No carotid bruit.  Cardiovascular:  Rate and Rhythm: Normal rate and regular rhythm.     Pulses: Normal pulses.     Heart sounds: No murmur heard.    No friction rub. No gallop.  Pulmonary:     Effort: Pulmonary effort is normal. No respiratory distress.     Breath sounds: Normal breath sounds. No stridor. No wheezing, rhonchi or rales.  Chest:     Chest wall: No tenderness.  Abdominal:     General: Abdomen is flat. Bowel sounds are normal. There is no distension.     Palpations: Abdomen is soft. There is no mass.     Tenderness: There is no abdominal tenderness. There is no right CVA tenderness, left CVA tenderness, guarding or rebound.      Hernia: No hernia is present.  Genitourinary:    Comments: Breast and pelvic exams deferred with shared decision making Musculoskeletal:        General: No swelling, tenderness, deformity or signs of injury.     Cervical back: Normal range of motion and neck supple. No rigidity. No muscular tenderness.     Right lower leg: No edema.     Left lower leg: No edema.  Lymphadenopathy:     Cervical: No cervical adenopathy.  Skin:    General: Skin is warm and dry.     Capillary Refill: Capillary refill takes less than 2 seconds.     Coloration: Skin is not jaundiced or pale.     Findings: No bruising, erythema, lesion or rash.  Neurological:     General: No focal deficit present.     Mental Status: She is alert and oriented to person, place, and time. Mental status is at baseline.     Cranial Nerves: No cranial nerve deficit.     Sensory: No sensory deficit.     Motor: No weakness.     Coordination: Coordination normal.     Gait: Gait normal.     Deep Tendon Reflexes: Reflexes normal.  Psychiatric:        Mood and Affect: Mood normal.        Behavior: Behavior normal.        Thought Content: Thought content normal.        Judgment: Judgment normal.     Results for orders placed or performed in visit on 06/10/23  Microscopic Examination   Collection Time: 06/10/23 11:17 AM   BLD  Result Value Ref Range   WBC, UA 0-5 0 - 5 /hpf   RBC, Urine None seen 0 - 2 /hpf   Epithelial Cells (non renal) >10 (A) 0 - 10 /hpf   Renal Epithel, UA 0-10 None seen /hpf   Bacteria, UA Few (A) None seen/Few  Bayer DCA Hb A1c Waived   Collection Time: 06/10/23 11:17 AM  Result Value Ref Range   HB A1C (BAYER DCA - WAIVED) 5.5 4.8 - 5.6 %  Microalbumin, Urine Waived   Collection Time: 06/10/23 11:17 AM  Result Value Ref Range   Microalb, Ur Waived 30 (H) 0 - 19 mg/L   Creatinine, Urine Waived 300 10 - 300 mg/dL   Microalb/Creat Ratio <30 <30 mg/g  Urinalysis, Routine w reflex microscopic    Collection Time: 06/10/23 11:17 AM  Result Value Ref Range   Specific Gravity, UA 1.020 1.005 - 1.030   pH, UA 6.0 5.0 - 7.5   Color, UA Yellow Yellow   Appearance Ur Cloudy (A) Clear   Leukocytes,UA Trace (A) Negative   Protein,UA Negative Negative/Trace  Glucose, UA Negative Negative   Ketones, UA Negative Negative   RBC, UA Negative Negative   Bilirubin, UA Negative Negative   Urobilinogen, Ur 1.0 0.2 - 1.0 mg/dL   Nitrite, UA Negative Negative   Microscopic Examination See below:   CBC with Differential/Platelet   Collection Time: 06/10/23 11:18 AM  Result Value Ref Range   WBC 4.1 3.4 - 10.8 x10E3/uL   RBC 4.15 3.77 - 5.28 x10E6/uL   Hemoglobin 14.8 11.1 - 15.9 g/dL   Hematocrit 44.0 10.2 - 46.6 %   MCV 102 (H) 79 - 97 fL   MCH 35.7 (H) 26.6 - 33.0 pg   MCHC 34.9 31.5 - 35.7 g/dL   RDW 72.5 36.6 - 44.0 %   Platelets 141 (L) 150 - 450 x10E3/uL   Neutrophils 52 Not Estab. %   Lymphs 28 Not Estab. %   Monocytes 16 Not Estab. %   Eos 3 Not Estab. %   Basos 1 Not Estab. %   Neutrophils Absolute 2.2 1.4 - 7.0 x10E3/uL   Lymphocytes Absolute 1.2 0.7 - 3.1 x10E3/uL   Monocytes Absolute 0.6 0.1 - 0.9 x10E3/uL   EOS (ABSOLUTE) 0.1 0.0 - 0.4 x10E3/uL   Basophils Absolute 0.0 0.0 - 0.2 x10E3/uL   Immature Granulocytes 0 Not Estab. %   Immature Grans (Abs) 0.0 0.0 - 0.1 x10E3/uL  Comprehensive metabolic panel   Collection Time: 06/10/23 11:18 AM  Result Value Ref Range   Glucose 111 (H) 70 - 99 mg/dL   BUN 10 8 - 27 mg/dL   Creatinine, Ser 3.47 0.57 - 1.00 mg/dL   eGFR 75 >42 VZ/DGL/8.75   BUN/Creatinine Ratio 12 12 - 28   Sodium 141 134 - 144 mmol/L   Potassium 4.5 3.5 - 5.2 mmol/L   Chloride 101 96 - 106 mmol/L   CO2 23 20 - 29 mmol/L   Calcium 9.7 8.7 - 10.3 mg/dL   Total Protein 6.8 6.0 - 8.5 g/dL   Albumin 4.4 3.8 - 4.8 g/dL   Globulin, Total 2.4 1.5 - 4.5 g/dL   Bilirubin Total 1.0 0.0 - 1.2 mg/dL   Alkaline Phosphatase 91 44 - 121 IU/L   AST 33 0 - 40 IU/L    ALT 23 0 - 32 IU/L  Lipid Panel w/o Chol/HDL Ratio   Collection Time: 06/10/23 11:18 AM  Result Value Ref Range   Cholesterol, Total 228 (H) 100 - 199 mg/dL   Triglycerides 643 0 - 149 mg/dL   HDL 68 >32 mg/dL   VLDL Cholesterol Cal 20 5 - 40 mg/dL   LDL Chol Calc (NIH) 951 (H) 0 - 99 mg/dL  VITAMIN D 25 Hydroxy (Vit-D Deficiency, Fractures)   Collection Time: 06/10/23 11:18 AM  Result Value Ref Range   Vit D, 25-Hydroxy 21.9 (L) 30.0 - 100.0 ng/mL  TSH   Collection Time: 06/10/23 11:18 AM  Result Value Ref Range   TSH 2.690 0.450 - 4.500 uIU/mL  B12   Collection Time: 06/10/23 11:18 AM  Result Value Ref Range   Vitamin B-12 325 232 - 1,245 pg/mL      Assessment & Plan:   Problem List Items Addressed This Visit       Cardiovascular and Mediastinum   Essential hypertension   Relevant Medications   tirzepatide (ZEPBOUND) 2.5 MG/0.5ML injection vial     Endocrine   IFG (impaired fasting glucose)   Relevant Medications   tirzepatide (ZEPBOUND) 2.5 MG/0.5ML injection vial     Musculoskeletal and  Integument   Osteoarthrosis   To have MRI. Will start her on gabapentin. Call with any concerns. Recheck in 6 months.         Other   Pure hypercholesterolemia   Relevant Medications   tirzepatide (ZEPBOUND) 2.5 MG/0.5ML injection vial   Overweight (BMI 25.0-29.9) - Primary   Will start her on zepbound and recheck in 6 weeks. Call with any concerns.       Relevant Medications   tirzepatide (ZEPBOUND) 2.5 MG/0.5ML injection vial   Other Visit Diagnoses       BMI 28.0-28.9,adult       Relevant Medications   tirzepatide (ZEPBOUND) 2.5 MG/0.5ML injection vial        Follow up plan: Return in about 6 weeks (around 09/02/2023) for weight follow up, ASAP with Maureen Sour for AWV.

## 2023-07-22 NOTE — Assessment & Plan Note (Signed)
 Will start her on zepbound and recheck in 6 weeks. Call with any concerns.

## 2023-07-29 ENCOUNTER — Ambulatory Visit

## 2023-07-29 VITALS — Ht 65.0 in | Wt 168.0 lb

## 2023-07-29 DIAGNOSIS — Z0000 Encounter for general adult medical examination without abnormal findings: Secondary | ICD-10-CM

## 2023-07-29 DIAGNOSIS — Z780 Asymptomatic menopausal state: Secondary | ICD-10-CM

## 2023-07-29 NOTE — Progress Notes (Signed)
 Subjective:   Tamara Fleming is a 78 y.o. who presents for a Medicare Wellness preventive visit.  Visit Complete: Virtual I connected with  Tamara Fleming on 07/29/23 by a audio enabled telemedicine application and verified that I am speaking with the correct person using two identifiers.  Patient Location: Home  Provider Location: Home Office  I discussed the limitations of evaluation and management by telemedicine. The patient expressed understanding and agreed to proceed.  Vital Signs: Because this visit was a virtual/telehealth visit, some criteria may be missing or patient reported. Any vitals not documented were not able to be obtained and vitals that have been documented are patient reported.  VideoDeclined- This patient declined Librarian, academic. Therefore the visit was completed with audio only.  Persons Participating in Visit: Patient.  AWV Questionnaire: No: Patient Medicare AWV questionnaire was not completed prior to this visit.  Cardiac Risk Factors include: advanced age (>44men, >23 women);dyslipidemia;hypertension     Objective:    Today's Vitals   07/29/23 0955 07/29/23 0957  Weight: 168 lb (76.2 kg)   Height: 5\' 5"  (1.651 m)   PainSc:  6    Body mass index is 27.96 kg/m.     07/29/2023   10:14 AM 02/03/2023   11:21 AM 01/20/2023   11:47 AM 10/03/2021    7:29 AM 12/23/2020    5:09 PM 06/01/2019    2:42 PM 01/08/2018   11:20 AM  Advanced Directives  Does Patient Have a Medical Advance Directive? Yes Yes Yes No No No   Type of Estate agent of [place];Living will Healthcare Power of [employer] of [place];Living will      Does patient want to make changes to medical advance directive? No - Patient declined No - Patient declined       Copy of Healthcare Power of Attorney in Chart? No - copy requested No - copy requested No - copy requested      Would patient like information on creating  a medical advance directive?    No - Patient declined No - Patient declined  No - Patient declined    Current Medications (verified) Outpatient Encounter Medications as of 07/29/2023  Medication Sig   aspirin  EC 81 MG tablet Take 81 mg by mouth daily.   budesonide -formoterol  (SYMBICORT ) 160-4.5 MCG/ACT inhaler Inhale 2 puffs into the lungs 2 (two) times daily.   Cyanocobalamin  (VITAMIN B-12 IJ) Inject as directed every 30 (thirty) days.   gabapentin  (NEURONTIN ) 100 MG capsule Take 1 capsule (100 mg total) by mouth 3 (three) times daily.   HYDROcodone -acetaminophen  (NORCO/VICODIN) 5-325 MG tablet Take 1 tablet by mouth every 6 (six) hours as needed for moderate pain (pain score 4-6).   losartan  (COZAAR ) 50 MG tablet Take 1 tablet (50 mg total) by mouth daily.   omeprazole  (PRILOSEC  OTC) 20 MG tablet Take 2 tablets (40 mg total) by mouth daily.   spironolactone  (ALDACTONE ) 25 MG tablet Take 1 tablet (25 mg total) by mouth daily.   ALPRAZolam  (XANAX ) 0.5 MG tablet Take 30 minutes prior to flight (Patient not taking: Reported on 01/12/2023)   tirzepatide (ZEPBOUND) 2.5 MG/0.5ML injection vial Inject 2.5 mg into the skin once a week. (Patient not taking: Reported on 07/29/2023)   triamcinolone  ointment (KENALOG ) 0.5 % Apply 1 Application topically 2 (two) times daily. (Patient not taking: Reported on 02/01/2023)   Vitamin D , Ergocalciferol , (DRISDOL ) 1.25 MG (50000 UNIT) CAPS capsule Take 1 capsule (50,000 Units total) by  mouth every 7 (seven) days. (Patient not taking: Reported on 07/29/2023)   Facility-Administered Encounter Medications as of 07/29/2023  Medication   cyanocobalamin  (VITAMIN B12) injection 1,000 mcg    Allergies (verified) Ciprofloxacin, Floxin [ofloxacin], Lipitor [atorvastatin ], and Sulfa antibiotics   History: Past Medical History:  Diagnosis Date   Actinic keratosis    Anxiety    Arthritis of left hip 02/17/2017   Basal cell carcinoma 10/30/2019   L chin. Superficial.    Benign esophageal stricture    Carpal tunnel syndrome 05/09/2014   Right   Chest pain    Chest pain with high risk for cardiac etiology 10/20/2010   Chronic low back pain    Complication of anesthesia    small mouth   Diverticulitis 02/17/2017   Fatigue    GERD (gastroesophageal reflux disease)    occ   Gout    Heart murmur    Hiatal hernia    Hip pain    Left   History of heart attack    Hyperlipidemia    Hypertension    Low back pain (secondary) bilateral (right greater than left) 09/30/2016   OA (osteoarthritis)    Peripheral vascular disease (HCC)    plhebitis 20 yrs ago   Piriformis syndrome of right side 02/17/2017   Primary osteoarthritis of first carpometacarpal joint of left hand 09/10/2016   Primary osteoarthritis of left hip 03/23/2017   Past Surgical History:  Procedure Laterality Date   ABDOMINAL HYSTERECTOMY  1982   Partial   APPENDECTOMY     ARM SURGERY Left    OF THE ULNA NERVE   BREAST EXCISIONAL BIOPSY Right    CARDIOVASCULAR STRESS TEST  08/11/2006   NORMAL. EF 55-60%   CARDIOVASCULAR STRESS TEST  10/31/1998   EF 60% AT REST TO 75-80% POST EXERCISE   CARPAL TUNNEL RELEASE Right 2015   CATARACT EXTRACTION W/PHACO Right 01/20/2023   Procedure: CATARACT EXTRACTION PHACO AND INTRAOCULAR LENS PLACEMENT (IOC) RIGHT  CLAREON PANOPTIC TORIC 12.87 01:02.9;  Surgeon: Annell Kidney, MD;  Location: [facility];  Service: Ophthalmology;  Laterality: Right;   CATARACT EXTRACTION W/PHACO Left 02/03/2023   Procedure: CATARACT EXTRACTION PHACO AND INTRAOCULAR LENS PLACEMENT (IOC) LEFT  CLAREON PANOPTIC 10.30 00:59.4;  Surgeon: Annell Kidney, MD;  Location: [facility];  Service: Ophthalmology;  Laterality: Left;   ESOPHAGOGASTRODUODENOSCOPY  05/16/1990   ESOPHAGOGASTRODUODENOSCOPY (EGD) WITH PROPOFOL  N/A 02/04/2015   Procedure: ESOPHAGOGASTRODUODENOSCOPY (EGD) WITH PROPOFOL ;  Surgeon: Stephens Eis, MD;  Location: [facility];  Service:  Gastroenterology;  Laterality: N/A;   JOINT REPLACEMENT     REDUCTION MAMMAPLASTY  06/11/15   TOTAL HIP ARTHROPLASTY Left 04/13/2017   Procedure: TOTAL LEFT HIP ARTHROPLASTY ANTERIOR APPROACH;  Surgeon: Saundra Curl, MD;  Location: [facility];  Service: Orthopedics;  Laterality: Left;   Family History  Problem Relation Age of Onset   Coronary artery disease Mother    Cancer Father        Lung   Social History   Socioeconomic History   Marital status: Married    Spouse name: Carolin Chyle   Number of children: 1   Years of education: 55, 1 year college    Highest education level: Some college, no degree  Occupational History   Occupation: self employed   Tobacco Use   Smoking status: Never   Smokeless tobacco: Never  Vaping Use   Vaping status: Never Used  Substance and Sexual Activity   Alcohol  use: Yes    Alcohol /week: 7.0 standard  drinks of alcohol     Types: 7 Glasses of wine per week    Comment: 1 glass of wine daily   Drug use: Yes    Comment: prescribed hydrocodone  by pain mgmt   Sexual activity: Not Currently    Birth control/protection: None  Other Topics Concern   Not on file  Social History Narrative   Self employed, 1 biological child and 2 step-children   Social Drivers of Corporate investment banker Strain: Low Risk  (07/29/2023)   Overall Financial Resource Strain (CARDIA)    Difficulty of Paying Living Expenses: Not hard at all  Food Insecurity: No Food Insecurity (07/29/2023)   Hunger Vital Sign    Worried About Running Out of Food in the Last Year: Never true    Ran Out of Food in the Last Year: Never true  Transportation Needs: No Transportation Needs (07/29/2023)   PRAPARE - Administrator, Civil Service (Medical): No    Lack of Transportation (Non-Medical): No  Physical Activity: Inactive (07/29/2023)   Exercise Vital Sign    Days of Exercise per Week: 0 days    Minutes of Exercise per Session: 0 min  Stress: Stress Concern Present (07/29/2023)    [employer] of Occupational Health - Occupational Stress Questionnaire    Feeling of Stress : Rather much  Social Connections: Unknown (07/29/2023)   Social Connection and Isolation Panel [NHANES]    Frequency of Communication with Friends and Family: More than three times a week    Frequency of Social Gatherings with Friends and Family: More than three times a week    Attends Religious Services: Patient declined    Database administrator or Organizations: Yes    Attends Engineer, structural: More than 4 times per year    Marital Status: Married    Tobacco Counseling Counseling given: Not Answered    Clinical Intake:  Pre-visit preparation completed: Yes  Pain : 0-10 Pain Score: 6  Pain Type: Acute pain, Other (Comment) (due to dental procedures) Pain Location: Mouth Pain Descriptors / Indicators: Aching     BMI - recorded: 27.96 Nutritional Status: BMI 25 -29 Overweight Nutritional Risks: None Diabetes: No  Lab Results  Component Value Date   HGBA1C 5.5 06/10/2023   HGBA1C 6.0 (H) 06/08/2022   HGBA1C 6.3 (H) 02/23/2022     How often do you need to have someone help you when you read instructions, pamphlets, or other written materials from your doctor or pharmacy?: 1 - Never  Interpreter Needed?: No  Information entered by :: Jaunita Messier, CMA   Activities of Daily Living     07/29/2023    9:59 AM 02/03/2023   11:12 AM  In your present state of health, do you have any difficulty performing the following activities:  Hearing? 1 0  Comment ringing in the ears   Vision? 0 0  Difficulty concentrating or making decisions? 0 0  Walking or climbing stairs? 0   Dressing or bathing? 0   Doing errands, shopping? 0   Preparing Food and eating ? N   Using the Toilet? N   In the past six months, have you accidently leaked urine? N   Do you have problems with loss of bowel control? N   Managing your Medications? N   Managing your Finances? N    Housekeeping or managing your Housekeeping? N     Patient Care Team: Solomon Dupre, DO as PCP - General (Family Medicine)  Alix Aquas, MD as Consulting Physician (Orthopedic Surgery) Percival Brace, MD as Consulting Physician (Cardiology) Glorious Larry, FNP as Referring Physician (Pain Medicine) Elta Halter, MD (Dermatology) [facility], [facility])  Indicate any recent Medical Services you may have received from other than [facility] providers in the past year (date may be approximate).     Assessment:   This is a routine wellness examination for Tamara Fleming.  Hearing/Vision screen Hearing Screening - Comments:: Ringing in ears Vision Screening - Comments:: Gets eye exams, Dr. Janeice Medal and Dr. Dellia Ferguson @ [place], [place]   Goals Addressed             This Visit's Progress    Patient Stated       Lose 15-20 lbs     COMPLETED: Weight (lb) < 200 lb (90.7 kg)   168 lb (76.2 kg)      Depression Screen     07/29/2023   10:13 AM 07/22/2023   11:15 AM 06/10/2023   10:36 AM 02/01/2023    1:33 PM 12/01/2022    1:25 PM 10/30/2022   11:22 AM 06/08/2022   11:23 AM  PHQ 2/9 Scores  PHQ - 2 Score  0 0 0 0 0 0  PHQ- 9 Score  0   0 0 0  Exception Documentation Patient refusal          Fall Risk     07/29/2023   10:16 AM 06/10/2023   10:36 AM 12/01/2022    1:25 PM 10/30/2022   11:22 AM 06/08/2022   11:23 AM  Fall Risk   Falls in the past year? 1 1 0 0 0  Number falls in past yr: 0 0 0 0 0  Injury with Fall? 1 1 0 0 0  Risk for fall due to : History of fall(s);Impaired balance/gait;Orthopedic patient History of fall(s) No Fall Risks No Fall Risks No Fall Risks  Follow up Falls prevention discussed;Falls evaluation completed;Education provided Falls evaluation completed Falls evaluation completed Falls evaluation completed Falls evaluation completed    MEDICARE RISK AT HOME:  Medicare Risk at Home Any stairs in or around the home?: Yes (2 steps,  no handrails) If so, are there any without handrails?: Yes Home free of loose throw rugs in walkways, pet beds, electrical cords, etc?: Yes Adequate lighting in your home to reduce risk of falls?: Yes Life alert?: No Use of a cane, walker or w/c?: No Grab bars in the bathroom?: Yes Shower chair or bench in shower?: Yes Elevated toilet seat or a handicapped toilet?: Yes  TIMED UP AND GO:  Was the test performed?  No  Cognitive Function: 6CIT completed        07/29/2023   10:18 AM 12/31/2021   11:15 AM 12/23/2020    5:12 PM 11/17/2017    3:24 PM  6CIT Screen  What Year? 0 points 0 points 0 points 0 points  What month? 0 points 0 points 0 points 0 points  What time? 0 points 0 points 0 points 0 points  Count back from 20 0 points 0 points 0 points 0 points  Months in reverse 0 points 0 points 0 points 0 points  Repeat phrase 0 points 0 points 0 points 0 points  Total Score 0 points 0 points 0 points 0 points    Immunizations Immunization History  Administered Date(s) Administered   Fluad Quad(high Dose 65+) 12/20/2018, 01/29/2020, 01/20/2021, 01/20/2022   Fluad Trivalent(High Dose 65+) 02/01/2023   Fluzone   Influenza virus vaccine,trivalent (IIV3), split virus 02/24/2013, 01/04/2014   Influenza, High Dose Seasonal PF 01/21/2016, 01/20/2017, 03/15/2018   Influenza-Unspecified 02/27/2015   PFIZER(Purple Top)SARS-COV-2 Vaccination 05/19/2019, 06/13/2019, 04/02/2020   Pneumococcal Conjugate-13 11/17/2013   Pneumococcal Polysaccharide-23 05/16/2014    Screening Tests Health Maintenance  Topic Date Due   Zoster Vaccines- Shingrix (1 of 2) Never done   DEXA SCAN  06/14/2014   MAMMOGRAM  10/23/2022   DTaP/Tdap/Td (1 - Tdap) 06/09/2024 (Originally 05/29/1964)   INFLUENZA VACCINE  11/05/2023   Medicare Annual Wellness (AWV)  07/28/2024   Pneumonia Vaccine 17+ Years old  Completed   Hepatitis C Screening  Addressed   HPV VACCINES  Aged Out   Meningococcal B Vaccine  Aged Out    Colonoscopy  Discontinued   COVID-19 Vaccine  Discontinued    Health Maintenance  Health Maintenance Due  Topic Date Due   Zoster Vaccines- Shingrix (1 of 2) Never done   DEXA SCAN  06/14/2014   MAMMOGRAM  10/23/2022   Health Maintenance Items Addressed: DEXA ordered, See Nurse Notes  Additional Screening:  Vision Screening: Recommended annual ophthalmology exams for early detection of glaucoma and other disorders of the eye.  Dental Screening: Recommended annual dental exams for proper oral hygiene  Community Resource Referral / Chronic Care Management: CRR required this visit?  No   CCM required this visit?  No     Plan:     I have personally reviewed and noted the following in the patient's chart:   Medical and social history Use of alcohol , tobacco or illicit drugs  Current medications and supplements including opioid prescriptions. Patient is not currently taking opioid prescriptions. Functional ability and status Nutritional status Physical activity Advanced directives List of other physicians Hospitalizations, surgeries, and ER visits in previous 12 months Vitals Screenings to include cognitive, depression, and falls Referrals and appointments  In addition, I have reviewed and discussed with patient certain preventive protocols, quality metrics, and best practice recommendations. A written personalized care plan for preventive services as well as general preventive health recommendations were provided to patient.     Jaunita Messier, CMA   07/29/2023   After Visit Summary: (MyChart) Due to this being a telephonic visit, the after visit summary with patients personalized plan was offered to patient via MyChart   Notes:  Needs MMG (order placed 02/16/23) gave ph# to schedule Placed order for DEXA scan Needs shingles vaccines (pharmacy) Declined Covid and Tdap vaccines

## 2023-07-29 NOTE — Patient Instructions (Addendum)
 Tamara Fleming , Thank you for taking time to come for your Medicare Wellness Visit. I appreciate your ongoing commitment to your health goals. Please review the following plan we discussed and let me know if I can assist you in the future.   Referrals/Orders/Follow-Ups/Clinician Recommendations: I have placed an order for a bone density test. You may have this at the same time as your mammogram. Call [facility] @ [phone] to schedule at your earliest convenience. You may get the shingles vaccines at your local pharmacy  This is a list of the screening recommended for you and due dates:  Health Maintenance  Topic Date Due   Zoster (Shingles) Vaccine (1 of 2) Never done   DEXA scan (bone density measurement)  06/14/2014   Mammogram  10/23/2022   DTaP/Tdap/Td vaccine (1 - Tdap) 06/09/2024*   Flu Shot  11/05/2023   Medicare Annual Wellness Visit  07/28/2024   Pneumonia Vaccine  Completed   Hepatitis C Screening  Addressed   HPV Vaccine  Aged Out   Meningitis B Vaccine  Aged Out   Colon Cancer Screening  Discontinued   COVID-19 Vaccine  Discontinued  *Topic was postponed. The date shown is not the original due date.    Advanced directives: (Copy Requested) Please bring a copy of your health care power of attorney and living will to the office to be added to your chart at your convenience. You can mail to [facility] [address] [place], [place] [address] or email to [email or web address]  Next Medicare Annual Wellness Visit scheduled for next year: Yes, 08/10/24 @ 10:00am (phone visit)  Fall Prevention in the Home, Adult Falls can cause injuries and affect people of all ages. There are many simple things that you can do to make your home safe and to help prevent falls. If you need it, ask for help making these changes. What actions can I take to prevent falls? General information Use good lighting in all rooms. Make sure to: Replace  any light bulbs that burn out. Turn on lights if it is dark and use night-lights. Keep items that you use often in easy-to-reach places. Lower the shelves around your home if needed. Move furniture so that there are clear paths around it. Do not keep throw rugs or other things on the floor that can make you trip. If any of your floors are uneven, fix them. Add color or contrast paint or tape to clearly mark and help you see: Grab bars or handrails. First and last steps of staircases. Where the edge of each step is. If you use a ladder or stepladder: Make sure that it is fully opened. Do not climb a closed ladder. Make sure the sides of the ladder are locked in place. Have someone hold the ladder while you use it. Know where your pets are as you move through your home. What can I do in the bathroom?     Keep the floor dry. Clean up any water that is on the floor right away. Remove soap buildup in the bathtub or shower. Buildup makes bathtubs and showers slippery. Use non-skid mats or decals on the floor of the bathtub or shower. Attach bath mats securely with double-sided, non-slip rug tape. If you need to sit down while you are in the shower, use a non-slip stool. Install grab bars by the toilet and in the bathtub and shower. Do not use towel bars as grab bars. What can  I do in the bedroom? Make sure that you have a light by your bed that is easy to reach. Do not use any sheets or blankets on your bed that hang to the floor. Have a firm bench or chair with side arms that you can use for support when you get dressed. What can I do in the kitchen? Clean up any spills right away. If you need to reach something above you, use a sturdy step stool that has a grab bar. Keep electrical cables out of the way. Do not use floor polish or wax that makes floors slippery. What can I do with my stairs? Do not leave anything on the stairs. Make sure that you have a light switch at the top and the  bottom of the stairs. Have them installed if you do not have them. Make sure that there are handrails on both sides of the stairs. Fix handrails that are broken or loose. Make sure that handrails are as long as the staircases. Install non-slip stair treads on all stairs in your home if they do not have carpet. Avoid having throw rugs at the top or bottom of stairs, or secure the rugs with carpet tape to prevent them from moving. Choose a carpet design that does not hide the edge of steps on the stairs. Make sure that carpet is firmly attached to the stairs. Fix any carpet that is loose or worn. What can I do on the outside of my home? Use bright outdoor lighting. Repair the edges of walkways and driveways and fix any cracks. Clear paths of anything that can make you trip, such as tools or rocks. Add color or contrast paint or tape to clearly mark and help you see high doorway thresholds. Trim any bushes or trees on the main path into your home. Check that handrails are securely fastened and in good repair. Both sides of all steps should have handrails. Install guardrails along the edges of any raised decks or porches. Have leaves, snow, and ice cleared regularly. Use sand, salt, or ice melt on walkways during winter months if you live where there is ice and snow. In the garage, clean up any spills right away, including grease or oil spills. What other actions can I take? Review your medicines with your health care provider. Some medicines can make you confused or feel dizzy. This can increase your chance of falling. Wear closed-toe shoes that fit well and support your feet. Wear shoes that have rubber soles and low heels. Use a cane, walker, scooter, or crutches that help you move around if needed. Talk with your provider about other ways that you can decrease your risk of falls. This may include seeing a physical therapist to learn to do exercises to improve movement and strength. Where to find  more information Centers for Disease Control and Prevention, STEADI: TonerPromos.no [employer] on Aging: BaseRingTones.pl National Institute on Aging: BaseRingTones.pl Contact a health care provider if: You are afraid of falling at home. You feel weak, drowsy, or dizzy at home. You fall at home. Get help right away if you: Lose consciousness or have trouble moving after a fall. Have a fall that causes a head injury. These symptoms may be an emergency. Get help right away. Call 911. Do not wait to see if the symptoms will go away. Do not drive yourself to the hospital. This information is not intended to replace advice given to you by your health care provider. Make sure you discuss  any questions you have with your health care provider. Document Revised: 11/24/2021 Document Reviewed: 11/24/2021 Elsevier Patient Education  2024 Elsevier Inc.  Managing Pain Without Opioids Opioids are strong medicines used to treat moderate to severe pain. For some people, especially those who have long-term (chronic) pain, opioids may not be the best choice for pain management due to: Side effects like nausea, constipation, and sleepiness. The risk of addiction (opioid use disorder). The longer you take opioids, the greater your risk of addiction. Pain that lasts for more than 3 months is called chronic pain. Managing chronic pain usually requires more than one approach and is often provided by a team of health care providers working together (multidisciplinary approach). Pain management may be done at a pain management center or pain clinic. How to manage pain without the use of opioids Use non-opioid medicines Non-opioid medicines for pain may include: Over-the-counter or prescription non-steroidal anti-inflammatory drugs (NSAIDs). These may be the first medicines used for pain. They work well for muscle and bone pain, and they reduce swelling. Acetaminophen . This over-the-counter medicine may work well for milder  pain but not swelling. Antidepressants. These may be used to treat chronic pain. A certain type of antidepressant (tricyclics) is often used. These medicines are given in lower doses for pain than when used for depression. Anticonvulsants. These are usually used to treat seizures but may also reduce nerve (neuropathic) pain. Muscle relaxants. These relieve pain caused by sudden muscle tightening (spasms). You may also use a pain medicine that is applied to the skin as a patch, cream, or gel (topical analgesic), such as a numbing medicine. These may cause fewer side effects than medicines taken by mouth. Do certain therapies as directed Some therapies can help with pain management. They include: Physical therapy. You will do exercises to gain strength and flexibility. A physical therapist may teach you exercises to move and stretch parts of your body that are weak, stiff, or painful. You can learn these exercises at physical therapy visits and practice them at home. Physical therapy may also involve: Massage. Heat wraps or applying heat or cold to affected areas. Electrical signals that interrupt pain signals (transcutaneous electrical nerve stimulation, TENS). Weak lasers that reduce pain and swelling (low-level laser therapy). Signals from your body that help you learn to regulate pain (biofeedback). Occupational therapy. This helps you to learn ways to function at home and work with less pain. Recreational therapy. This involves trying new activities or hobbies, such as a physical activity or drawing. Mental health therapy, including: Cognitive behavioral therapy (CBT). This helps you learn coping skills for dealing with pain. Acceptance and commitment therapy (ACT) to change the way you think and react to pain. Relaxation therapies, including muscle relaxation exercises and mindfulness-based stress reduction. Pain management counseling. This may be individual, family, or group  counseling.  Receive medical treatments Medical treatments for pain management include: Nerve block injections. These may include a pain blocker and anti-inflammatory medicines. You may have injections: Near the spine to relieve chronic back or neck pain. Into joints to relieve back or joint pain. Into nerve areas that supply a painful area to relieve body pain. Into muscles (trigger point injections) to relieve some painful muscle conditions. A medical device placed near your spine to help block pain signals and relieve nerve pain or chronic back pain (spinal cord stimulation device). Acupuncture. Follow these instructions at home Medicines Take over-the-counter and prescription medicines only as told by your health care provider. If you are  taking pain medicine, ask your health care providers about possible side effects to watch out for. Do not drive or use heavy machinery while taking prescription opioid pain medicine. Lifestyle  Do not use drugs or alcohol  to reduce pain. If you drink alcohol , limit how much you have to: 0-1 drink a day for women who are not pregnant. 0-2 drinks a day for men. Know how much alcohol  is in a drink. In the [place] one drink equals one 12 oz bottle of beer (355 mL), one 5 oz glass of wine (148 mL), or one 1 oz glass of hard liquor (44 mL). Do not use any products that contain nicotine or tobacco. These products include cigarettes, chewing tobacco, and vaping devices, such as e-cigarettes. If you need help quitting, ask your health care provider. Eat a healthy diet and maintain a healthy weight. Poor diet and excess weight may make pain worse. Eat foods that are high in fiber. These include fresh fruits and vegetables, whole grains, and beans. Limit foods that are high in fat and processed sugars, such as fried and sweet foods. Exercise regularly. Exercise lowers stress and may help relieve pain. Ask your health care provider what activities and exercises  are safe for you. If your health care provider approves, join an exercise class that combines movement and stress reduction. Examples include yoga and tai chi. Get enough sleep. Lack of sleep may make pain worse. Lower stress as much as possible. Practice stress reduction techniques as told by your therapist. General instructions Work with all your pain management providers to find the treatments that work best for you. You are an important member of your pain management team. There are many things you can do to reduce pain on your own. Consider joining an online or in-person support group for people who have chronic pain. Keep all follow-up visits. This is important. Where to find more information You can find more information about managing pain without opioids from: American Academy of Pain Medicine: painmed.org Institute for Chronic Pain: instituteforchronicpain.org American Chronic Pain Association: theacpa.org Contact a health care provider if: You have side effects from pain medicine. Your pain gets worse or does not get better with treatments or home therapy. You are struggling with anxiety or depression. Summary Many types of pain can be managed without opioids. Chronic pain may respond better to pain management without opioids. Pain is best managed when you and a team of health care providers work together. Pain management without opioids may include non-opioid medicines, medical treatments, physical therapy, mental health therapy, and lifestyle changes. Tell your health care providers if your pain gets worse or is not being managed well enough. This information is not intended to replace advice given to you by your health care provider. Make sure you discuss any questions you have with your health care provider. Document Revised: 07/03/2020 Document Reviewed: 07/03/2020 Elsevier Patient Education  2024 [employer].

## 2023-08-05 ENCOUNTER — Other Ambulatory Visit: Payer: Self-pay | Admitting: Family Medicine

## 2023-08-05 DIAGNOSIS — Z1231 Encounter for screening mammogram for malignant neoplasm of breast: Secondary | ICD-10-CM

## 2023-08-10 ENCOUNTER — Other Ambulatory Visit: Payer: Self-pay | Admitting: Nurse Practitioner

## 2023-08-10 DIAGNOSIS — M5412 Radiculopathy, cervical region: Secondary | ICD-10-CM

## 2023-08-11 ENCOUNTER — Encounter (HOSPITAL_COMMUNITY): Payer: Self-pay

## 2023-08-16 ENCOUNTER — Encounter (HOSPITAL_COMMUNITY): Payer: Self-pay

## 2023-08-18 ENCOUNTER — Ambulatory Visit
Admission: RE | Admit: 2023-08-18 | Discharge: 2023-08-18 | Disposition: A | Discharge status: Home / Self Care | Source: Ambulatory Visit | Attending: Family Medicine

## 2023-08-18 DIAGNOSIS — Z1231 Encounter for screening mammogram for malignant neoplasm of breast: Secondary | ICD-10-CM

## 2023-08-19 ENCOUNTER — Ambulatory Visit
Admission: RE | Admit: 2023-08-19 | Discharge: 2023-08-19 | Disposition: A | Discharge status: Home / Self Care | Source: Ambulatory Visit | Attending: Nurse Practitioner | Admitting: Nurse Practitioner

## 2023-08-19 DIAGNOSIS — M5412 Radiculopathy, cervical region: Secondary | ICD-10-CM | POA: Insufficient documentation

## 2023-08-20 ENCOUNTER — Ambulatory Visit: Payer: Self-pay | Admitting: Family Medicine

## 2023-08-23 ENCOUNTER — Encounter: Payer: Self-pay | Admitting: Family Medicine

## 2023-08-23 ENCOUNTER — Ambulatory Visit (INDEPENDENT_AMBULATORY_CARE_PROVIDER_SITE_OTHER): Admitting: Family Medicine

## 2023-08-23 VITALS — BP 139/72 | HR 66 | Temp 98.2°F | Resp 15 | Ht 65.0 in | Wt 170.4 lb

## 2023-08-23 DIAGNOSIS — M4722 Other spondylosis with radiculopathy, cervical region: Secondary | ICD-10-CM

## 2023-08-23 DIAGNOSIS — E663 Overweight: Secondary | ICD-10-CM

## 2023-08-23 MED ORDER — GABAPENTIN 100 MG PO CAPS: ORAL_CAPSULE | ORAL | 3 refills | Status: DC

## 2023-08-23 NOTE — Patient Instructions (Addendum)
 [facility] [place], [place] - [address], [place], [place] [address] [phone]

## 2023-08-23 NOTE — Assessment & Plan Note (Signed)
 Did not start her zepbound. Phone number to call regarding getting the Rx given today. Follow up in 6 weeks after starting medication.

## 2023-08-23 NOTE — Assessment & Plan Note (Addendum)
 No better on gabapentin . Awaiting MRI results. Will increase her gabapentin  to 200mg  TID and get her into PM&R.

## 2023-08-23 NOTE — Progress Notes (Signed)
 BP 139/72 (BP Location: Left Arm, Patient Position: Sitting, Cuff Size: Normal)   Pulse 66   Temp 98.2 F (36.8 C) (Oral)   Resp 15   Ht 5\' 5"  (1.651 m)   Wt 170 lb 6.4 oz (77.3 kg)   LMP  (LMP Unknown)   SpO2 95%   BMI 28.36 kg/m    Subjective:    Patient ID: Tamara Fleming, female    DOB: [birth year], 78 y.o.   MRN: [id number]  HPI: Tamara Fleming is a 78 y.o. female  Chief Complaint  Patient presents with   Weight Check   OVERWEIGHT Duration: chronic Previous attempts at weight loss: yes Complications of obesity: HTN, IFG, HLD Peak weight: 184lbs (current 170lbs) Weight loss goal: 25 lbs Weight loss to date: 12lbs Requesting obesity pharmacotherapy: yes Current weight loss supplements/medications: no Previous weight loss supplements/meds: yes- phentermine  NUMBNESS Duration: about 4-5 months Onset: sudden Location: Fingers 4 and 5 on both sides, R significantly worse than the L, but worsening Bilateral: yes Symmetric: no Decreased sensation: yes  Weakness: yes Pain: yes Quality:  numb and tingling, burning Severity: severe  Frequency: constant, waxing and waning Trauma: yes Recent illness: yes Diabetes: no Thyroid  disease: no  HIV: no  Alcoholism: no  Spinal cord injury: no Status: worse Treatments attempted: gabapentin   Relevant past medical, surgical, family and social history reviewed and updated as indicated. Interim medical history since our last visit reviewed. Allergies and medications reviewed and updated.  Review of Systems  Constitutional: Negative.   Respiratory: Negative.    Cardiovascular: Negative.   Musculoskeletal:  Positive for myalgias, neck pain and neck stiffness. Negative for arthralgias, back pain, gait problem and joint swelling.  Neurological:  Positive for weakness and numbness. Negative for dizziness, tremors, seizures, syncope, facial asymmetry, speech difficulty, light-headedness and headaches.   Psychiatric/Behavioral: Negative.      Per HPI unless specifically indicated above     Objective:     BP 139/72 (BP Location: Left Arm, Patient Position: Sitting, Cuff Size: Normal)   Pulse 66   Temp 98.2 F (36.8 C) (Oral)   Resp 15   Ht 5\' 5"  (1.651 m)   Wt 170 lb 6.4 oz (77.3 kg)   LMP  (LMP Unknown)   SpO2 95%   BMI 28.36 kg/m   Wt Readings from Last 3 Encounters:  08/23/23 170 lb 6.4 oz (77.3 kg)  07/29/23 168 lb (76.2 kg)  07/22/23 170 lb 12.8 oz (77.5 kg)    Physical Exam Vitals and nursing note reviewed.  Constitutional:      General: She is not in acute distress.    Appearance: Normal appearance. She is not ill-appearing, toxic-appearing or diaphoretic.  HENT:     Head: Normocephalic and atraumatic.     Right Ear: External ear normal.     Left Ear: External ear normal.     Nose: Nose normal.     Mouth/Throat:     Mouth: Mucous membranes are moist.     Pharynx: Oropharynx is clear.  Eyes:     General: No scleral icterus.       Right eye: No discharge.        Left eye: No discharge.     Extraocular Movements: Extraocular movements intact.     Conjunctiva/sclera: Conjunctivae normal.     Pupils: Pupils are equal, round, and reactive to light.  Cardiovascular:     Rate and Rhythm: Normal rate and regular rhythm.  Pulses: Normal pulses.     Heart sounds: Normal heart sounds. No murmur heard.    No friction rub. No gallop.  Pulmonary:     Effort: Pulmonary effort is normal. No respiratory distress.     Breath sounds: Normal breath sounds. No stridor. No wheezing, rhonchi or rales.  Chest:     Chest wall: No tenderness.  Musculoskeletal:        General: Normal range of motion.     Cervical back: Normal range of motion and neck supple.  Skin:    General: Skin is warm and dry.     Capillary Refill: Capillary refill takes less than 2 seconds.     Coloration: Skin is not jaundiced or pale.     Findings: No bruising, erythema, lesion or rash.   Neurological:     General: No focal deficit present.     Mental Status: She is alert and oriented to person, place, and time. Mental status is at baseline.  Psychiatric:        Mood and Affect: Mood normal.        Behavior: Behavior normal.        Thought Content: Thought content normal.        Judgment: Judgment normal.     Results for orders placed or performed in visit on 06/10/23  Microscopic Examination   Collection Time: 06/10/23 11:17 AM   BLD  Result Value Ref Range   WBC, UA 0-5 0 - 5 /hpf   RBC, Urine None seen 0 - 2 /hpf   Epithelial Cells (non renal) >10 (A) 0 - 10 /hpf   Renal Epithel, UA 0-10 None seen /hpf   Bacteria, UA Few (A) None seen/Few  Bayer DCA Hb A1c Waived   Collection Time: 06/10/23 11:17 AM  Result Value Ref Range   HB A1C (BAYER DCA - WAIVED) 5.5 4.8 - 5.6 %  Microalbumin, Urine Waived   Collection Time: 06/10/23 11:17 AM  Result Value Ref Range   Microalb, Ur Waived 30 (H) 0 - 19 mg/L   Creatinine, Urine Waived 300 10 - 300 mg/dL   Microalb/Creat Ratio <30 <30 mg/g  Urinalysis, Routine w reflex microscopic   Collection Time: 06/10/23 11:17 AM  Result Value Ref Range   Specific Gravity, UA 1.020 1.005 - 1.030   pH, UA 6.0 5.0 - 7.5   Color, UA Yellow Yellow   Appearance Ur Cloudy (A) Clear   Leukocytes,UA Trace (A) Negative   Protein,UA Negative Negative/Trace   Glucose, UA Negative Negative   Ketones, UA Negative Negative   RBC, UA Negative Negative   Bilirubin, UA Negative Negative   Urobilinogen, Ur 1.0 0.2 - 1.0 mg/dL   Nitrite, UA Negative Negative   Microscopic Examination See below:   CBC with Differential/Platelet   Collection Time: 06/10/23 11:18 AM  Result Value Ref Range   WBC 4.1 3.4 - 10.8 x10E3/uL   RBC 4.15 3.77 - 5.28 x10E6/uL   Hemoglobin 14.8 11.1 - 15.9 g/dL   Hematocrit 16.1 09.6 - 46.6 %   MCV 102 (H) 79 - 97 fL   MCH 35.7 (H) 26.6 - 33.0 pg   MCHC 34.9 31.5 - 35.7 g/dL   RDW 04.5 40.9 - 81.1 %   Platelets  141 (L) 150 - 450 x10E3/uL   Neutrophils 52 Not Estab. %   Lymphs 28 Not Estab. %   Monocytes 16 Not Estab. %   Eos 3 Not Estab. %   Basos 1 Not  Estab. %   Neutrophils Absolute 2.2 1.4 - 7.0 x10E3/uL   Lymphocytes Absolute 1.2 0.7 - 3.1 x10E3/uL   Monocytes Absolute 0.6 0.1 - 0.9 x10E3/uL   EOS (ABSOLUTE) 0.1 0.0 - 0.4 x10E3/uL   Basophils Absolute 0.0 0.0 - 0.2 x10E3/uL   Immature Granulocytes 0 Not Estab. %   Immature Grans (Abs) 0.0 0.0 - 0.1 x10E3/uL  Comprehensive metabolic panel   Collection Time: 06/10/23 11:18 AM  Result Value Ref Range   Glucose 111 (H) 70 - 99 mg/dL   BUN 10 8 - 27 mg/dL   Creatinine, Ser 1.61 0.57 - 1.00 mg/dL   eGFR 75 >09 UE/AVW/0.98   BUN/Creatinine Ratio 12 12 - 28   Sodium 141 134 - 144 mmol/L   Potassium 4.5 3.5 - 5.2 mmol/L   Chloride 101 96 - 106 mmol/L   CO2 23 20 - 29 mmol/L   Calcium  9.7 8.7 - 10.3 mg/dL   Total Protein 6.8 6.0 - 8.5 g/dL   Albumin 4.4 3.8 - 4.8 g/dL   Globulin, Total 2.4 1.5 - 4.5 g/dL   Bilirubin Total 1.0 0.0 - 1.2 mg/dL   Alkaline Phosphatase 91 44 - 121 IU/L   AST 33 0 - 40 IU/L   ALT 23 0 - 32 IU/L  Lipid Panel w/o Chol/HDL Ratio   Collection Time: 06/10/23 11:18 AM  Result Value Ref Range   Cholesterol, Total 228 (H) 100 - 199 mg/dL   Triglycerides 119 0 - 149 mg/dL   HDL 68 >14 mg/dL   VLDL Cholesterol Cal 20 5 - 40 mg/dL   LDL Chol Calc (NIH) 782 (H) 0 - 99 mg/dL  VITAMIN D  25 Hydroxy (Vit-D Deficiency, Fractures)   Collection Time: 06/10/23 11:18 AM  Result Value Ref Range   Vit D, 25-Hydroxy 21.9 (L) 30.0 - 100.0 ng/mL  TSH   Collection Time: 06/10/23 11:18 AM  Result Value Ref Range   TSH 2.690 0.450 - 4.500 uIU/mL  B12   Collection Time: 06/10/23 11:18 AM  Result Value Ref Range   Vitamin B-12 325 232 - 1,245 pg/mL      Assessment & Plan:   Problem List Items Addressed This Visit       Musculoskeletal and Integument   Osteoarthrosis - Primary   No better on gabapentin . Awaiting MRI  results. Will increase her gabapentin  to 200mg  TID and get her into PM&R.       Relevant Orders   Ambulatory referral to Physical Medicine Rehab     Other   Overweight (BMI 25.0-29.9)   Did not start her zepbound. Phone number to call regarding getting the Rx given today. Follow up in 6 weeks after starting medication.         Follow up plan: Return in about 6 weeks (around 10/04/2023).  25 minutes spent with patient today

## 2023-09-03 ENCOUNTER — Ambulatory Visit: Admitting: Family Medicine

## 2023-09-10 ENCOUNTER — Telehealth: Payer: Self-pay

## 2023-09-10 NOTE — Telephone Encounter (Signed)
 Copied from CRM [id number]. Topic: Clinical - Lab/Test Results >> Sep 09, 2023  1:57 PM Carlatta H wrote: Reason for CRM: Shemika from [facility] sent orders on 5/2 and was calling to inquire about the status//Please call [phone] to confirm receipt and time frame

## 2023-09-14 ENCOUNTER — Other Ambulatory Visit: Payer: Self-pay | Admitting: Family Medicine

## 2023-09-15 NOTE — Telephone Encounter (Signed)
 Spoke with patient and confirmed she is not interested in having any labs through this company and is now aware it is likely a Medicare scam.

## 2023-09-16 NOTE — Telephone Encounter (Signed)
 Rx - 06/10/23 #90 1RF- too soon Requested Prescriptions  Pending Prescriptions Disp Refills   losartan  (COZAAR ) 50 MG tablet [Pharmacy Med Name: LOSARTAN  POTASSIUM 50 MG TAB] 90 tablet 1    Sig: TAKE 1 TABLET BY MOUTH ONCE DAILY     Cardiovascular:  Angiotensin Receptor Blockers Passed - 09/16/2023 10:50 AM      Passed - Cr in normal range and within 180 days    Creatinine  Date Value Ref Range Status  09/05/2012 0.74 0.60 - 1.30 mg/dL Final   Creat  Date Value Ref Range Status  02/26/2012 0.75 0.50 - 1.10 mg/dL Final   Creatinine, Ser  Date Value Ref Range Status  06/10/2023 0.81 0.57 - 1.00 mg/dL Final         Passed - K in normal range and within 180 days    Potassium  Date Value Ref Range Status  06/10/2023 4.5 3.5 - 5.2 mmol/L Final  09/05/2012 3.9 3.5 - 5.1 mmol/L Final         Passed - Patient is not pregnant      Passed - Last BP in normal range    BP Readings from Last 1 Encounters:  08/23/23 139/72         Passed - Valid encounter within last 6 months    Recent Outpatient Visits           3 weeks ago Osteoarthritis of spine with radiculopathy, cervical region   [facility] [place], Megan P, DO   1 month ago Overweight (BMI 25.0-29.9)   [facility] [place], [place], DO   3 months ago Essential hypertension   [facility] Solomon Dupre, [place]       Future Appointments             In 5 months Elta Halter, MD [facility]

## 2023-10-05 ENCOUNTER — Ambulatory Visit: Admitting: Family Medicine

## 2023-10-06 ENCOUNTER — Ambulatory Visit: Admitting: Family Medicine

## 2023-10-12 ENCOUNTER — Ambulatory Visit: Admitting: Family Medicine

## 2023-10-21 ENCOUNTER — Ambulatory Visit: Admitting: Family Medicine

## 2023-11-17 ENCOUNTER — Telehealth: Payer: Self-pay | Admitting: Family Medicine

## 2023-11-17 NOTE — Telephone Encounter (Signed)
 Patient came into the office and stated that she needs her medication refilled because she has surgery on 11/24/2023 and will not know who long she will be off her feet. Please advise.

## 2023-11-18 NOTE — Telephone Encounter (Signed)
 What medication is the patient referring to? Please call and find out.

## 2023-11-19 NOTE — Telephone Encounter (Signed)
 Routing to provider to advise.

## 2023-11-19 NOTE — Telephone Encounter (Signed)
 Cannot write with without an appointment and would not advise starting a new medicine right before surgery.

## 2023-11-24 HISTORY — PX: NECK SURGERY: SHX720

## 2023-12-16 ENCOUNTER — Other Ambulatory Visit: Payer: Self-pay | Admitting: Family Medicine

## 2023-12-17 NOTE — Telephone Encounter (Signed)
 Courtesy refill. Patient will need an office visit for additional refills.  Requested Prescriptions  Pending Prescriptions Disp Refills   losartan  (COZAAR ) 50 MG tablet [Pharmacy Med Name: LOSARTAN  POTASSIUM 50 MG TAB] 30 tablet 0    Sig: TAKE 1 TABLET BY MOUTH ONCE DAILY     Cardiovascular:  Angiotensin Receptor Blockers Failed - 12/17/2023 10:41 AM      Failed - Cr in normal range and within 180 days    Creatinine  Date Value Ref Range Status  09/05/2012 0.74 0.60 - 1.30 mg/dL Final   Creat  Date Value Ref Range Status  02/26/2012 0.75 0.50 - 1.10 mg/dL Final   Creatinine, Ser  Date Value Ref Range Status  06/10/2023 0.81 0.57 - 1.00 mg/dL Final         Failed - K in normal range and within 180 days    Potassium  Date Value Ref Range Status  06/10/2023 4.5 3.5 - 5.2 mmol/L Final  09/05/2012 3.9 3.5 - 5.1 mmol/L Final         Passed - Patient is not pregnant      Passed - Last BP in normal range    BP Readings from Last 1 Encounters:  08/23/23 139/72         Passed - Valid encounter within last 6 months    Recent Outpatient Visits           3 months ago Osteoarthritis of spine with radiculopathy, cervical region   [facility] [place], Megan P, DO   4 months ago Overweight (BMI 25.0-29.9)   [facility] [place], Megan P, DO   6 months ago Essential hypertension   [facility] Vicci Duwaine SQUIBB, DO       Future Appointments             In 2 months Hester Alm BROCKS, MD [facility]

## 2024-01-03 ENCOUNTER — Ambulatory Visit: Payer: Self-pay

## 2024-01-03 NOTE — Telephone Encounter (Signed)
 FYI Only or Action Required?: FYI only for provider.  Patient was last seen in primary care on 08/23/2023 by Vicci Duwaine SQUIBB, DO.  Called Nurse Triage reporting Joint Swelling.  Symptoms began several days ago.  Interventions attempted: Rest, hydration, or home remedies.  Symptoms are: gradually worsening.  Triage Disposition: See PCP When Office is Open (Within 3 Days)  Patient/caregiver understands and will follow disposition?: Yes  Copied from CRM #[id number]. Topic: Clinical - Red Word Triage >> Jan 03, 2024  4:28 PM Avram MATSU wrote: Red Word that prompted transfer to Nurse Triage: swelling of the ankles, concerns over BP, over all don't feel well. Has pain due to surgery.     ----------------------------------------------------------------------- From previous Reason for Contact - Scheduling: Patient/patient representative is calling to schedule an appointment. Refer to attachments for appointment information. Reason for Disposition  [1] MILD swelling of both ankles (e.g., ankle joints look swollen; or bilateral mild pedal edema) AND [2] new-onset or getting worse  (Exceptions: Caused by hot weather, already seen by doctor or NP/PA for this.)  Answer Assessment - Initial Assessment Questions Pt had laminectomy two weeks ago; but states 4 days ago she began having bilat ankle swelling. Denies radiation/pain/discoloration. States she has had a slight increase in bp but feels it is related to her post surgical pain. Taking hydrocodone  for pain.   1. LOCATION: Which ankle is swollen? Where is the swelling?     Bilat ankles, does not spread 2. ONSET: When did the swelling start?     4 days ago 3. SWELLING: How bad is the swelling? Or, How large is it? (e.g., mild, moderate, severe; size of localized swelling)      mild 4. PAIN: Is there any pain? If Yes, ask: How bad is it? (Scale 0-10; or none, mild, moderate, severe)     denies 5. CAUSE: What do you think caused  the ankle swelling?     Thinks she's getting neuropathy in her feet 6. OTHER SYMPTOMS: Do you have any other symptoms? (e.g., fever, chest pain, difficulty breathing, calf pain)     HA, elevated bp 148/85 poss d/t pain per pt, denies CP/SOB  Protocols used: Ankle Swelling-A-AH

## 2024-01-04 ENCOUNTER — Ambulatory Visit (INDEPENDENT_AMBULATORY_CARE_PROVIDER_SITE_OTHER): Admitting: Physician Assistant

## 2024-01-04 ENCOUNTER — Encounter: Payer: Self-pay | Admitting: Physician Assistant

## 2024-01-04 VITALS — BP 132/62 | HR 76 | Temp 97.9°F | Ht 65.0 in | Wt 163.0 lb

## 2024-01-04 DIAGNOSIS — R600 Localized edema: Secondary | ICD-10-CM | POA: Diagnosis not present

## 2024-01-04 DIAGNOSIS — M542 Cervicalgia: Secondary | ICD-10-CM | POA: Insufficient documentation

## 2024-01-04 DIAGNOSIS — M546 Pain in thoracic spine: Secondary | ICD-10-CM | POA: Insufficient documentation

## 2024-01-04 DIAGNOSIS — M5412 Radiculopathy, cervical region: Secondary | ICD-10-CM | POA: Insufficient documentation

## 2024-01-04 DIAGNOSIS — M47814 Spondylosis without myelopathy or radiculopathy, thoracic region: Secondary | ICD-10-CM | POA: Insufficient documentation

## 2024-01-04 DIAGNOSIS — E663 Overweight: Secondary | ICD-10-CM

## 2024-01-04 DIAGNOSIS — Z79891 Long term (current) use of opiate analgesic: Secondary | ICD-10-CM | POA: Insufficient documentation

## 2024-01-04 DIAGNOSIS — I8391 Asymptomatic varicose veins of right lower extremity: Secondary | ICD-10-CM | POA: Insufficient documentation

## 2024-01-04 DIAGNOSIS — R202 Paresthesia of skin: Secondary | ICD-10-CM | POA: Diagnosis not present

## 2024-01-04 DIAGNOSIS — M791 Myalgia, unspecified site: Secondary | ICD-10-CM | POA: Insufficient documentation

## 2024-01-04 DIAGNOSIS — M25559 Pain in unspecified hip: Secondary | ICD-10-CM | POA: Insufficient documentation

## 2024-01-04 DIAGNOSIS — M47817 Spondylosis without myelopathy or radiculopathy, lumbosacral region: Secondary | ICD-10-CM | POA: Insufficient documentation

## 2024-01-04 DIAGNOSIS — Z23 Encounter for immunization: Secondary | ICD-10-CM | POA: Diagnosis not present

## 2024-01-04 NOTE — Progress Notes (Signed)
 Date:  01/04/2024   Name:  Tamara Fleming   DOB:  07-Sep-1945   MRN:  [id number]   Chief Complaint: Edema (X3 days,Bilateral ankles, better today )  HPI Dorthea is a pleasant 78 year old female who presents new to our practice today (typically sees Dr. Duwaine Louder) here acutely for evaluation of BLE edema for the last few days.  It has improved since onset.  She denies any chest pain or dyspnea.  She also mentions a new varicose vein on the right lower extremity for roughly 1 month, though it is nontender.  No calf pain.  Reports recent spinal fusion.  Questionable history of DVT in distant past, but patient unsure about the details. Mentions vague burning and paresthesia of BLE. No claudication, though she is generally pretty inactive.   She begins to discuss her desire for weight loss.  Particularly concerned with central adiposity.  BMI is 27.  She tells me that apparently she was approved for Zepbound  but that she is not currently using it.  Asks about Adipex.  She usually eats once per day, says she does not overeat.  Drinks about 3 diet sodas per day.  Does not have any routine exercise to speak of.   Medication list has been reviewed and updated.  Current Meds  Medication Sig   ALPRAZolam  (XANAX ) 0.5 MG tablet Take 30 minutes prior to flight   aspirin  EC 81 MG tablet Take 81 mg by mouth daily.   budesonide -formoterol  (SYMBICORT ) 160-4.5 MCG/ACT inhaler Inhale 2 puffs into the lungs 2 (two) times daily.   Cyanocobalamin  (VITAMIN B-12 IJ) Inject as directed every 30 (thirty) days.   HYDROcodone -acetaminophen  (NORCO/VICODIN) 5-325 MG tablet Take 1 tablet by mouth every 6 (six) hours as needed for moderate pain (pain score 4-6).   losartan  (COZAAR ) 50 MG tablet TAKE 1 TABLET BY MOUTH ONCE DAILY   omeprazole  (PRILOSEC  OTC) 20 MG tablet Take 2 tablets (40 mg total) by mouth daily.   spironolactone  (ALDACTONE ) 25 MG tablet Take 1 tablet (25 mg total) by mouth daily.   [DISCONTINUED]  gabapentin  (NEURONTIN ) 100 MG capsule Increase to 1 pill in the AM and the PM and 2 pills at bedtime for 1 week, then increase to 1 pill in the AM and 2 pills in the PM and at bedtime for 1 week then 2 pills TID   [DISCONTINUED] triamcinolone  ointment (KENALOG ) 0.5 % Apply 1 Application topically 2 (two) times daily.   Current Facility-Administered Medications for the 01/04/24 encounter (Office Visit) with Manya Toribio SQUIBB, PA  Medication   cyanocobalamin  (VITAMIN B12) injection 1,000 mcg     Review of Systems  Patient Active Problem List   Diagnosis Date Noted   Cervical radiculopathy 01/04/2024   Neck pain 01/04/2024   Pain in joint involving pelvic region and thigh 01/04/2024   Thoracic spondylosis 01/04/2024   Pain in thoracic spine 01/04/2024   Myalgia 01/04/2024   Lumbosacral spondylosis without myelopathy 01/04/2024   Long term (current) use of opiate analgesic 01/04/2024   Senile purpura 12/01/2022   Statin myopathy 12/01/2022   Overweight (BMI 25.0-29.9) 08/21/2021   Kidney stone on left side 06/12/2021   Hiatal hernia 06/12/2021   Aortic atherosclerosis 06/25/2020   Gout 04/26/2019   Vitamin D  deficiency 10/20/2017   Anxiety 08/25/2017   Diverticulosis 07/13/2017   B12 deficiency 12/08/2016   Chronic pain 09/30/2016   Low back pain 09/30/2016   Psoriasis, unspecified 09/10/2016   Essential hypertension 06/04/2015   IFG (impaired  fasting glucose) 06/04/2015   Pure hypercholesterolemia 05/08/2015   Carpal tunnel syndrome 05/09/2014   Left hip pain 10/20/2010   Osteoarthrosis 10/20/2010    Allergies  Allergen Reactions   Ciprofloxacin Anaphylaxis    Facial, tongue and throat    Floxin [Ofloxacin] Anaphylaxis    Facial, tongue and throat    Lipitor [Atorvastatin ] Other (See Comments)    myalgias   Sulfa Antibiotics Diarrhea and Nausea And Vomiting    Immunization History  Administered Date(s) Administered   Fluad Quad(high Dose 65+) 12/20/2018,  01/29/2020, 01/20/2021, 01/20/2022   Fluad Trivalent(High Dose 65+) 02/01/2023   Fluzone  Influenza virus vaccine,trivalent (IIV3), split virus 02/24/2013, 01/04/2014   INFLUENZA, HIGH DOSE SEASONAL PF 01/21/2016, 01/20/2017, 03/15/2018, 01/04/2024   Influenza-Unspecified 02/27/2015   PFIZER(Purple Top)SARS-COV-2 Vaccination 05/19/2019, 06/13/2019, 04/02/2020   Pneumococcal Conjugate-13 11/17/2013   Pneumococcal Polysaccharide-23 05/16/2014    Past Surgical History:  Procedure Laterality Date   ABDOMINAL HYSTERECTOMY  1982   Partial   APPENDECTOMY     ARM SURGERY Left    OF THE ULNA NERVE   BREAST EXCISIONAL BIOPSY Right    CARDIOVASCULAR STRESS TEST  08/11/2006   NORMAL. EF 55-60%   CARDIOVASCULAR STRESS TEST  10/31/1998   EF 60% AT REST TO 75-80% POST EXERCISE   CARPAL TUNNEL RELEASE Right 2015   CATARACT EXTRACTION W/PHACO Right 01/20/2023   Procedure: CATARACT EXTRACTION PHACO AND INTRAOCULAR LENS PLACEMENT (IOC) RIGHT  CLAREON PANOPTIC TORIC 12.87 01:02.9;  Surgeon: Mittie Gaskin, MD;  Location: [facility];  Service: Ophthalmology;  Laterality: Right;   CATARACT EXTRACTION W/PHACO Left 02/03/2023   Procedure: CATARACT EXTRACTION PHACO AND INTRAOCULAR LENS PLACEMENT (IOC) LEFT  CLAREON PANOPTIC 10.30 00:59.4;  Surgeon: Mittie Gaskin, MD;  Location: [facility];  Service: Ophthalmology;  Laterality: Left;   ESOPHAGOGASTRODUODENOSCOPY  05/16/1990   ESOPHAGOGASTRODUODENOSCOPY (EGD) WITH PROPOFOL  N/A 02/04/2015   Procedure: ESOPHAGOGASTRODUODENOSCOPY (EGD) WITH PROPOFOL ;  Surgeon: Deward CINDERELLA Piedmont, MD;  Location: [facility];  Service: Gastroenterology;  Laterality: N/A;   JOINT REPLACEMENT     NECK SURGERY  11/24/2023   REDUCTION MAMMAPLASTY  06/11/2015   TOTAL HIP ARTHROPLASTY Left 04/13/2017   Procedure: TOTAL LEFT HIP ARTHROPLASTY ANTERIOR APPROACH;  Surgeon: Beverley Evalene BIRCH, MD;  Location: [facility] OR;  Service: Orthopedics;  Laterality: Left;    Social  History   Tobacco Use   Smoking status: Never   Smokeless tobacco: Never  Vaping Use   Vaping status: Never Used  Substance Use Topics   Alcohol  use: Yes    Alcohol /week: 7.0 standard drinks of alcohol     Types: 7 Glasses of wine per week    Comment: 1 glass of wine daily   Drug use: Yes    Comment: prescribed hydrocodone  by pain mgmt    Family History  Problem Relation Age of Onset   Coronary artery disease Mother    Cancer Father        Lung        01/04/2024   11:13 AM 08/23/2023   11:36 AM 07/22/2023   11:17 AM 06/10/2023   10:36 AM  GAD 7 : Generalized Anxiety Score  Nervous, Anxious, on Edge 0 0 0 0  Control/stop worrying 0 0 0 0  Worry too much - different things 0 0 0 0  Trouble relaxing 0 0 0 0  Restless 0 0 0 0  Easily annoyed or irritable 0 0 0 0  Afraid - awful might happen 0 0 0 0  Total GAD 7  Score 0 0 0 0  Anxiety Difficulty Not difficult at all          01/04/2024   11:13 AM 08/23/2023   11:36 AM 07/22/2023   11:15 AM  Depression screen PHQ 2/9  Decreased Interest 0 0 0  Down, Depressed, Hopeless 0 0 0  PHQ - 2 Score 0 0 0  Altered sleeping  0 0  Tired, decreased energy  0 0  Change in appetite  0 0  Feeling bad or failure about yourself   0 0  Trouble concentrating  0 0  Moving slowly or fidgety/restless  0 0  Suicidal thoughts  0 0  PHQ-9 Score  0 0    BP Readings from Last 3 Encounters:  01/04/24 132/62  08/23/23 139/72  07/22/23 133/81    Wt Readings from Last 3 Encounters:  01/04/24 163 lb (73.9 kg)  08/23/23 170 lb 6.4 oz (77.3 kg)  07/29/23 168 lb (76.2 kg)    BP 132/62 (Cuff Size: Large)   Pulse 76   Temp 97.9 F (36.6 C)   Ht 5' 5 (1.651 m)   Wt 163 lb (73.9 kg)   LMP  (LMP Unknown)   SpO2 96%   BMI 27.12 kg/m   Physical Exam Vitals and nursing note reviewed.  Constitutional:      Appearance: Normal appearance.  Cardiovascular:     Rate and Rhythm: Normal rate.     Comments: Trace BLE edema most noticeable  around the ankles.  Borderline delayed capillary refill of left foot.  Pulses 1+ bilaterally at PT and DP.  Modified (reclined) Buerger's test inconclusive with pallor during elevation but without dependent rubor when seated.  No ulcerations of either foot. Varicose vein noted of right thigh. Pulmonary:     Effort: Pulmonary effort is normal.  Abdominal:     General: There is no distension.  Musculoskeletal:        General: Normal range of motion.  Skin:    General: Skin is warm and dry.  Neurological:     Mental Status: She is alert and oriented to person, place, and time.     Gait: Gait is intact.  Psychiatric:        Mood and Affect: Mood and affect normal.    Diabetic Foot Exam - Simple   Simple Foot Form Diabetic Foot exam was performed with the following findings: Yes 01/04/2024 11:20 AM  Visual Inspection No deformities, no ulcerations, no other skin breakdown bilaterally: Yes Sensation Testing Intact to touch and monofilament testing bilaterally: Yes Pulse Check Posterior Tibialis and Dorsalis pulse intact bilaterally: Yes Comments      Recent Labs     Component Value Date/Time   NA 141 06/10/2023 1118   NA 139 09/05/2012 0441   K 4.5 06/10/2023 1118   K 3.9 09/05/2012 0441   CL 101 06/10/2023 1118   CL 105 09/05/2012 0441   CO2 23 06/10/2023 1118   CO2 29 09/05/2012 0441   GLUCOSE 111 (H) 06/10/2023 1118   GLUCOSE 142 (H) 10/03/2021 0734   GLUCOSE 108 (H) 09/05/2012 0441   BUN 10 06/10/2023 1118   BUN 6 (L) 09/05/2012 0441   CREATININE 0.81 06/10/2023 1118   CREATININE 0.74 09/05/2012 0441   CREATININE 0.75 02/26/2012 1621   CALCIUM  9.7 06/10/2023 1118   CALCIUM  9.0 09/05/2012 0441   PROT 6.8 06/10/2023 1118   PROT 7.8 09/01/2012 1451   ALBUMIN 4.4 06/10/2023 1118   ALBUMIN 4.0 09/01/2012 1451  AST 33 06/10/2023 1118   AST 25 09/01/2012 1451   ALT 23 06/10/2023 1118   ALT 35 09/01/2012 1451   ALKPHOS 91 06/10/2023 1118   ALKPHOS 88 09/01/2012 1451    BILITOT 1.0 06/10/2023 1118   BILITOT 1.1 (H) 09/01/2012 1451   GFRNONAA 55 (L) 10/03/2021 0734   GFRNONAA >60 09/05/2012 0441   GFRAA 85 12/28/2019 1341   GFRAA >60 09/05/2012 0441    Lab Results  Component Value Date   WBC 4.1 06/10/2023   HGB 14.8 06/10/2023   HCT 42.4 06/10/2023   MCV 102 (H) 06/10/2023   PLT 141 (L) 06/10/2023   Lab Results  Component Value Date   HGBA1C 5.5 06/10/2023   HGBA1C 6.0 (H) 06/08/2022   HGBA1C 6.3 (H) 02/23/2022   Lab Results  Component Value Date   CHOL 228 (H) 06/10/2023   HDL 68 06/10/2023   LDLCALC 140 (H) 06/10/2023   LDLDIRECT 125.0 04/22/2011   TRIG 112 06/10/2023   CHOLHDL 3 06/29/2012   Lab Results  Component Value Date   TSH 2.690 06/10/2023      Assessment and Plan:  1. Peripheral edema (Primary) Seems to be resolving/resolved.  Patient reassured.  Encouraged lower extremity elevation above the level of the heart as able.  2. Varicose veins of right lower extremity, unspecified whether complicated Patient would like vascular consult for varicose veins which I think is reasonable considering her complaints of paresthesia and burning in the lower extremities. - Ambulatory referral to Vascular Surgery  3. Paresthesia of both lower extremities - Ambulatory referral to Vascular Surgery  4. Overweight (BMI 25.0-29.9) Discussed with patient I would not prescribe nor advise any weight loss medications for her with a BMI of 27 and an age of 37.  The problem does not seem to be appetite related, as her appetite is already low.  Potential risks from GLP1RA, phentermine, or other pharmacotherapy options for weight loss would outweigh any foreseeable benefit.  Sarcopenia/body composition certainly a concern.    We discussed that abdominal fat is usually the first to appear and the last to disappear.  I encouraged her to eat at least 2 meals per day, and try to get some type of regular physical activity to keep the body, muscles,  and bones healthy.  Discouraged her from pursuing weight loss pharmacotherapy from another provider.  5. Encounter for immunization High-dose flu shot given today - Flu vaccine HIGH DOSE PF(Fluzone  Trivalent)    Return if symptoms worsen or fail to improve.   I personally spent a total of 35 minutes in the care of the patient today including preparing to see the patient, performing a medically appropriate exam/evaluation, counseling and educating, referring and communicating with other health care professionals, documenting clinical information in the EHR, and coordinating care.   Rolan Hoyle, PA-C, DMSc, Nutritionist [facility] [phone]

## 2024-01-11 ENCOUNTER — Other Ambulatory Visit: Payer: Self-pay | Admitting: Family Medicine

## 2024-01-13 NOTE — Telephone Encounter (Signed)
 Pt. Has appointment. Requested Prescriptions  Pending Prescriptions Disp Refills   losartan  (COZAAR ) 50 MG tablet [Pharmacy Med Name: LOSARTAN  POTASSIUM 50 MG TAB] 30 tablet 0    Sig: TAKE 1 TABLET BY MOUTH ONCE DAILY *NEED APPT**     Cardiovascular:  Angiotensin Receptor Blockers Failed - 01/13/2024 10:52 AM      Failed - Cr in normal range and within 180 days    Creatinine  Date Value Ref Range Status  09/05/2012 0.74 0.60 - 1.30 mg/dL Final   Creat  Date Value Ref Range Status  02/26/2012 0.75 0.50 - 1.10 mg/dL Final   Creatinine, Ser  Date Value Ref Range Status  06/10/2023 0.81 0.57 - 1.00 mg/dL Final         Failed - K in normal range and within 180 days    Potassium  Date Value Ref Range Status  06/10/2023 4.5 3.5 - 5.2 mmol/L Final  09/05/2012 3.9 3.5 - 5.1 mmol/L Final         Passed - Patient is not pregnant      Passed - Last BP in normal range    BP Readings from Last 1 Encounters:  01/04/24 132/62         Passed - Valid encounter within last 6 months    Recent Outpatient Visits           1 week ago Peripheral edema   [facility], Toribio SQUIBB, PA   4 months ago Osteoarthritis of spine with radiculopathy, cervical region   [facility] [place], Megan P, DO   5 months ago Overweight (BMI 25.0-29.9)   [facility] [place], Megan P, DO   7 months ago Essential hypertension   [facility] Vicci Duwaine SQUIBB, DO       Future Appointments             In 1 month Hester Alm BROCKS, MD [facility]

## 2024-01-21 ENCOUNTER — Ambulatory Visit: Admitting: Family Medicine

## 2024-01-25 ENCOUNTER — Other Ambulatory Visit (INDEPENDENT_AMBULATORY_CARE_PROVIDER_SITE_OTHER): Payer: Self-pay | Admitting: Nurse Practitioner

## 2024-01-25 ENCOUNTER — Ambulatory Visit (INDEPENDENT_AMBULATORY_CARE_PROVIDER_SITE_OTHER): Admitting: Nurse Practitioner

## 2024-01-25 ENCOUNTER — Encounter (INDEPENDENT_AMBULATORY_CARE_PROVIDER_SITE_OTHER): Payer: Self-pay | Admitting: Nurse Practitioner

## 2024-01-25 ENCOUNTER — Ambulatory Visit (INDEPENDENT_AMBULATORY_CARE_PROVIDER_SITE_OTHER)

## 2024-01-25 VITALS — BP 123/72 | HR 80 | Resp 17 | Ht 65.0 in | Wt 176.8 lb

## 2024-01-25 DIAGNOSIS — I10 Essential (primary) hypertension: Secondary | ICD-10-CM

## 2024-01-25 DIAGNOSIS — I83811 Varicose veins of right lower extremities with pain: Secondary | ICD-10-CM

## 2024-01-25 DIAGNOSIS — M7989 Other specified soft tissue disorders: Secondary | ICD-10-CM | POA: Diagnosis not present

## 2024-01-25 NOTE — Progress Notes (Signed)
 Subjective:    Patient ID: Tamara Fleming, female    DOB: [birth year], 78 y.o.   MRN: [id number] Chief Complaint  Patient presents with   New Patient (Initial Visit)    Reflux + Consult. Varicose veins of right lower extremity, unspecified whether complicated. Paresthesia of both lower extremities.  [name], [name]  Pt concerns of swelling and varicose veins    The patient is seen for evaluation of symptomatic varicose veins. The patient relates burning and stinging which worsened steadily throughout the course of the day, particularly with standing. The patient also notes an aching and throbbing pain over the varicosities, particularly with prolonged dependent positions. The symptoms are significantly improved with elevation.  The patient also notes that during hot weather the symptoms are greatly intensified. The patient states the pain from the varicose veins interferes with work, daily exercise, shopping and household maintenance. At this point, the symptoms are persistent and severe enough that they're having a negative impact on lifestyle and are interfering with daily activities.  In addition she notes that she has been having swelling more in her bilateral feet and ankles that is concerning for her.  There is no history of DVT, PE or superficial thrombophlebitis. There is no history of ulceration or hemorrhage. The patient denies a significant family history of varicose veins.  The patient has not worn graduated compression in the past. At the present time the patient has not been using over-the-counter analgesics. There is no history of prior surgical intervention or sclerotherapy.      Review of Systems  Cardiovascular:  Positive for leg swelling.  Musculoskeletal:  Positive for arthralgias.  All other systems reviewed and are negative.      Objective:   Physical Exam Vitals reviewed.  HENT:     Head: Normocephalic.  Cardiovascular:     Rate and Rhythm: Normal  rate.     Pulses: Normal pulses.  Pulmonary:     Effort: Pulmonary effort is normal.  Skin:    General: Skin is warm and dry.  Neurological:     Mental Status: She is alert and oriented to person, place, and time.  Psychiatric:        Mood and Affect: Mood normal.        Behavior: Behavior normal.        Thought Content: Thought content normal.        Judgment: Judgment normal.     BP 123/72   Pulse 80   Resp 17   Ht 5' 5 (1.651 m)   Wt 176 lb 12.8 oz (80.2 kg)   LMP  (LMP Unknown)   BMI 29.42 kg/m   Past Medical History:  Diagnosis Date   Actinic keratosis    Anxiety    Arthritis of left hip 02/17/2017   Basal cell carcinoma 10/30/2019   L chin. Superficial.   Benign esophageal stricture    Carpal tunnel syndrome 05/09/2014   Right   Chest pain    Chest pain with high risk for cardiac etiology 10/20/2010   Chronic low back pain    Complication of anesthesia    small mouth   Diverticulitis 02/17/2017   Fatigue    GERD (gastroesophageal reflux disease)    occ   Gout    Heart murmur    Hiatal hernia    Hip pain    Left   History of heart attack    Hyperlipidemia    Hypertension    Low back pain (secondary)  bilateral (right greater than left) 09/30/2016   OA (osteoarthritis)    Peripheral vascular disease    plhebitis 20 yrs ago   Piriformis syndrome of right side 02/17/2017   Primary osteoarthritis of first carpometacarpal joint of left hand 09/10/2016   Primary osteoarthritis of left hip 03/23/2017    Social History   Socioeconomic History   Marital status: Married    Spouse name: Vaughn   Number of children: 1   Years of education: 13, 1 year college    Highest education level: Some college, no degree  Occupational History   Occupation: self employed   Tobacco Use   Smoking status: Never   Smokeless tobacco: Never  Vaping Use   Vaping status: Never Used  Substance and Sexual Activity   Alcohol  use: Yes    Alcohol /week: 7.0 standard drinks  of alcohol     Types: 7 Glasses of wine per week    Comment: 1 glass of wine daily   Drug use: Yes    Comment: prescribed hydrocodone  by pain mgmt   Sexual activity: Not Currently    Birth control/protection: None  Other Topics Concern   Not on file  Social History Narrative   Self employed, 1 biological child and 2 step-children   Social Drivers of Corporate investment banker Strain: Low Risk  (08/31/2023)   Received from [facility]   Overall Financial Resource Strain (CARDIA)    Difficulty of Paying Living Expenses: Not hard at all  Food Insecurity: No Food Insecurity (08/31/2023)   Received from [facility]   Hunger Vital Sign    Within the past 12 months, you worried that your food would run out before you got the money to buy more.: Never true    Within the past 12 months, the food you bought just didn't last and you didn't have money to get more.: Never true  Transportation Needs: No Transportation Needs (08/31/2023)   Received from [facility] - Transportation    In the past 12 months, has lack of transportation kept you from medical appointments or from getting medications?: No    Lack of Transportation (Non-Medical): No  Physical Activity: Inactive (07/29/2023)   Exercise Vital Sign    Days of Exercise per Week: 0 days    Minutes of Exercise per Session: 0 min  Stress: Stress Concern Present (07/29/2023)   [employer] of Occupational Health - Occupational Stress Questionnaire    Feeling of Stress : Rather much  Social Connections: Unknown (07/29/2023)   Social Connection and Isolation Panel    Frequency of Communication with Friends and Family: More than three times a week    Frequency of Social Gatherings with Friends and Family: More than three times a week    Attends Religious Services: Patient declined    Database administrator or Organizations: Yes    Attends Engineer, structural: More than  4 times per year    Marital Status: Married  Catering manager Violence: Not At Risk (07/29/2023)   Humiliation, Afraid, Rape, and Kick questionnaire    Fear of Current or Ex-Partner: No    Emotionally Abused: No    Physically Abused: No    Sexually Abused: No    Past Surgical History:  Procedure Laterality Date   ABDOMINAL HYSTERECTOMY  1982   Partial   APPENDECTOMY     ARM SURGERY Left    OF THE ULNA NERVE   BREAST  EXCISIONAL BIOPSY Right    CARDIOVASCULAR STRESS TEST  08/11/2006   NORMAL. EF 55-60%   CARDIOVASCULAR STRESS TEST  10/31/1998   EF 60% AT REST TO 75-80% POST EXERCISE   CARPAL TUNNEL RELEASE Right 2015   CATARACT EXTRACTION W/PHACO Right 01/20/2023   Procedure: CATARACT EXTRACTION PHACO AND INTRAOCULAR LENS PLACEMENT (IOC) RIGHT  CLAREON PANOPTIC TORIC 12.87 01:02.9;  Surgeon: Mittie Gaskin, MD;  Location: [facility];  Service: Ophthalmology;  Laterality: Right;   CATARACT EXTRACTION W/PHACO Left 02/03/2023   Procedure: CATARACT EXTRACTION PHACO AND INTRAOCULAR LENS PLACEMENT (IOC) LEFT  CLAREON PANOPTIC 10.30 00:59.4;  Surgeon: Mittie Gaskin, MD;  Location: [facility];  Service: Ophthalmology;  Laterality: Left;   ESOPHAGOGASTRODUODENOSCOPY  05/16/1990   ESOPHAGOGASTRODUODENOSCOPY (EGD) WITH PROPOFOL  N/A 02/04/2015   Procedure: ESOPHAGOGASTRODUODENOSCOPY (EGD) WITH PROPOFOL ;  Surgeon: Deward CINDERELLA Piedmont, MD;  Location: [facility];  Service: Gastroenterology;  Laterality: N/A;   JOINT REPLACEMENT     NECK SURGERY  11/24/2023   REDUCTION MAMMAPLASTY  06/11/2015   TOTAL HIP ARTHROPLASTY Left 04/13/2017   Procedure: TOTAL LEFT HIP ARTHROPLASTY ANTERIOR APPROACH;  Surgeon: Beverley Evalene BIRCH, MD;  Location: [facility];  Service: Orthopedics;  Laterality: Left;    Family History  Problem Relation Age of Onset   Coronary artery disease Mother    Cancer Father        Lung    Allergies  Allergen Reactions   Ciprofloxacin Anaphylaxis    Facial,  tongue and throat    Floxin [Ofloxacin] Anaphylaxis    Facial, tongue and throat    Lipitor [Atorvastatin ] Other (See Comments)    myalgias   Sulfa Antibiotics Diarrhea and Nausea And Vomiting       Latest Ref Rng & Units 06/10/2023   11:18 AM 10/30/2022   11:57 AM 06/08/2022   11:34 AM  CBC  WBC 3.4 - 10.8 x10E3/uL 4.1  3.9  5.4   Hemoglobin 11.1 - 15.9 g/dL 85.1  86.5  86.3   Hematocrit 34.0 - 46.6 % 42.4  38.5  40.8   Platelets 150 - 450 x10E3/uL 141  137  156       CMP     Component Value Date/Time   NA 141 06/10/2023 1118   NA 139 09/05/2012 0441   K 4.5 06/10/2023 1118   K 3.9 09/05/2012 0441   CL 101 06/10/2023 1118   CL 105 09/05/2012 0441   CO2 23 06/10/2023 1118   CO2 29 09/05/2012 0441   GLUCOSE 111 (H) 06/10/2023 1118   GLUCOSE 142 (H) 10/03/2021 0734   GLUCOSE 108 (H) 09/05/2012 0441   BUN 10 06/10/2023 1118   BUN 6 (L) 09/05/2012 0441   CREATININE 0.81 06/10/2023 1118   CREATININE 0.74 09/05/2012 0441   CREATININE 0.75 02/26/2012 1621   CALCIUM  9.7 06/10/2023 1118   CALCIUM  9.0 09/05/2012 0441   PROT 6.8 06/10/2023 1118   PROT 7.8 09/01/2012 1451   ALBUMIN 4.4 06/10/2023 1118   ALBUMIN 4.0 09/01/2012 1451   AST 33 06/10/2023 1118   AST 25 09/01/2012 1451   ALT 23 06/10/2023 1118   ALT 35 09/01/2012 1451   ALKPHOS 91 06/10/2023 1118   ALKPHOS 88 09/01/2012 1451   BILITOT 1.0 06/10/2023 1118   BILITOT 1.1 (H) 09/01/2012 1451   GFR 69.22 06/29/2012 1110   EGFR 75 06/10/2023 1118   GFRNONAA 55 (L) 10/03/2021 0734   GFRNONAA >60 09/05/2012 0441     No results found.  Assessment & Plan:   1. Varicose veins of right lower extremity with pain (Primary)  Recommend:  The patient has large symptomatic varicose veins that are painful and associated with swelling. The patient is CEAP [id number]   I have had a long discussion with the patient regarding  varicose veins and why they cause symptoms.  Patient will begin wearing graduated compression  stockings class 1 on a daily basis, beginning first thing in the morning and removing them in the evening. The patient is instructed specifically not to sleep in the stockings.    The patient  will also begin using over-the-counter analgesics such as Motrin 600 mg po TID to help control the symptoms.    In addition, behavioral modification including elevation during the day will be initiated.    Pending the results of these changes the  patient will be reevaluated in three months.    Further plans will be based on the ultrasound results and whether conservative therapies are successful at eliminating the pain and swelling.   2. Essential hypertension Continue antihypertensive medications as already ordered, these medications have been reviewed and there are no changes at this time.  3. Leg swelling Based on the patient's studies today she does have evidence of venous insufficiency.  I have a large suspicion that her swelling is as a result of the underlying venous insufficiency.  She does have concerns that it is related to her kidney function.  In reviewing her labs she has always had normal kidney function but certainly this is something that should be evaluated in the setting of any swelling.  She does have an upcoming evaluation with her PCP and she will undergo blood work at that time.  As noted above she is recommended to utilize compression to help with the swelling.   Current Outpatient Medications on File Prior to Visit  Medication Sig Dispense Refill   ALPRAZolam  (XANAX ) 0.5 MG tablet Take 30 minutes prior to flight 15 tablet 0   aspirin  EC 81 MG tablet Take 81 mg by mouth daily.     budesonide -formoterol  (SYMBICORT ) 160-4.5 MCG/ACT inhaler Inhale 2 puffs into the lungs 2 (two) times daily. 30.6 g 1   Cyanocobalamin  (VITAMIN B-12 IJ) Inject as directed every 30 (thirty) days.     HYDROcodone -acetaminophen  (NORCO/VICODIN) 5-325 MG tablet Take 1 tablet by mouth every 6 (six) hours as  needed for moderate pain (pain score 4-6).     losartan  (COZAAR ) 50 MG tablet TAKE 1 TABLET BY MOUTH ONCE DAILY *NEED APPT** 30 tablet 0   omeprazole  (PRILOSEC  OTC) 20 MG tablet Take 2 tablets (40 mg total) by mouth daily. 180 tablet 1   spironolactone  (ALDACTONE ) 25 MG tablet Take 1 tablet (25 mg total) by mouth daily. 90 tablet 1   Current Facility-Administered Medications on File Prior to Visit  Medication Dose Route Frequency Provider Last Rate Last Admin   cyanocobalamin  (VITAMIN B12) injection 1,000 mcg  1,000 mcg Intramuscular Q30 days         There are no Patient Instructions on file for this visit. No follow-ups on file.   Ilijah Doucet E Aldyn Toon, NP

## 2024-01-31 ENCOUNTER — Encounter (INDEPENDENT_AMBULATORY_CARE_PROVIDER_SITE_OTHER): Admitting: Nurse Practitioner

## 2024-01-31 ENCOUNTER — Encounter (INDEPENDENT_AMBULATORY_CARE_PROVIDER_SITE_OTHER)

## 2024-02-11 ENCOUNTER — Ambulatory Visit: Admitting: Family Medicine

## 2024-02-16 ENCOUNTER — Ambulatory Visit (INDEPENDENT_AMBULATORY_CARE_PROVIDER_SITE_OTHER): Admitting: Family Medicine

## 2024-02-16 ENCOUNTER — Encounter: Payer: Self-pay | Admitting: Family Medicine

## 2024-02-16 VITALS — BP 123/74 | HR 80 | Temp 98.0°F | Ht 65.0 in | Wt 175.6 lb

## 2024-02-16 DIAGNOSIS — E7800 Pure hypercholesterolemia, unspecified: Secondary | ICD-10-CM

## 2024-02-16 DIAGNOSIS — R7301 Impaired fasting glucose: Secondary | ICD-10-CM

## 2024-02-16 DIAGNOSIS — E538 Deficiency of other specified B group vitamins: Secondary | ICD-10-CM

## 2024-02-16 DIAGNOSIS — D692 Other nonthrombocytopenic purpura: Secondary | ICD-10-CM

## 2024-02-16 DIAGNOSIS — I8391 Asymptomatic varicose veins of right lower extremity: Secondary | ICD-10-CM

## 2024-02-16 DIAGNOSIS — I10 Essential (primary) hypertension: Secondary | ICD-10-CM

## 2024-02-16 DIAGNOSIS — T466X5A Adverse effect of antihyperlipidemic and antiarteriosclerotic drugs, initial encounter: Secondary | ICD-10-CM

## 2024-02-16 DIAGNOSIS — R600 Localized edema: Secondary | ICD-10-CM

## 2024-02-16 DIAGNOSIS — E559 Vitamin D deficiency, unspecified: Secondary | ICD-10-CM

## 2024-02-16 DIAGNOSIS — M1A9XX0 Chronic gout, unspecified, without tophus (tophi): Secondary | ICD-10-CM

## 2024-02-16 DIAGNOSIS — E663 Overweight: Secondary | ICD-10-CM

## 2024-02-16 DIAGNOSIS — G720 Drug-induced myopathy: Secondary | ICD-10-CM

## 2024-02-16 LAB — BAYER DCA HB A1C WAIVED: HB A1C (BAYER DCA - WAIVED): 5.6 % (ref 4.8–5.6)

## 2024-02-16 MED ORDER — TIRZEPATIDE-WEIGHT MANAGEMENT 2.5 MG/0.5ML ~~LOC~~ SOLN: 2.5000 mg | SUBCUTANEOUS | 0 refills | Status: DC

## 2024-02-16 MED ORDER — SPIRONOLACTONE 25 MG PO TABS: 25.0000 mg | ORAL_TABLET | Freq: Every day | ORAL | 1 refills | Status: AC

## 2024-02-16 MED ORDER — OMEPRAZOLE MAGNESIUM 20 MG PO TBEC: 40.0000 mg | DELAYED_RELEASE_TABLET | Freq: Every day | ORAL | 1 refills | Status: AC

## 2024-02-16 MED ORDER — LOSARTAN POTASSIUM 50 MG PO TABS: 50.0000 mg | ORAL_TABLET | Freq: Every day | ORAL | 1 refills | Status: DC

## 2024-02-16 NOTE — Progress Notes (Signed)
 BP 123/74 (BP Location: Left Arm, Patient Position: Sitting, Cuff Size: Normal)   Pulse 80   Temp 98 F (36.7 C) (Oral)   Ht 5' 5 (1.651 m)   Wt 175 lb 9.6 oz (79.7 kg)   LMP  (LMP Unknown)   SpO2 98%   BMI 29.22 kg/m    Subjective:    Patient ID: Tamara Fleming, female    DOB: 1945-06-12, 78 y.o.   MRN: [id number]  HPI: Tamara Fleming is a 78 y.o. female  Chief Complaint  Patient presents with   Foot Swelling    Both ankles and feet. Right worse. Burning sensation in legs. Onset about a month. Varicose veins in legs. Painful at night. During the day feels hot.    Saw vascular 2 weeks ago. Has significant varicose veins. They recommended that she start wearing compression stockings. They wanted to follow up with her in 3 months. She has not been wearing her compression socks. She notes that they hurt her legs.   No gout flares. Feeling well. No side effects.   Impaired Fasting Glucose HbA1C:  Lab Results  Component Value Date   HGBA1C 5.6 02/16/2024   Duration of elevated blood sugar: chronic Polydipsia: no Polyuria: no Weight change: no Visual disturbance: no Glucose Monitoring: no    Accucheck frequency: Not Checking Diabetic Education: Not Completed Family history of diabetes: no  HYPERTENSION / HYPERLIPIDEMIA Satisfied with current treatment? yes Duration of hypertension: chronic BP monitoring frequency: not checking BP medication side effects: no Past BP meds: spironalactone, losartan  Duration of hyperlipidemia: chronic Cholesterol medication side effects: N/A Cholesterol supplements: none Past cholesterol medications: none Medication compliance: excellent compliance Aspirin : no Recent stressors: no Recurrent headaches: no Visual changes: no Palpitations: no Dyspnea: no Chest pain: no Lower extremity edema: no Dizzy/lightheaded: no  OVERWEIGHT Duration: chronic Previous attempts at weight loss: yes Complications of obesity: HTN, IFG,  HLD Peak weight: 184lbs (current 170lbs) Weight loss goal: 25 lbs Weight loss to date: 10lbs Requesting obesity pharmacotherapy: yes Current weight loss supplements/medications: no Previous weight loss supplements/meds: yes- phentermine  Relevant past medical, surgical, family and social history reviewed and updated as indicated. Interim medical history since our last visit reviewed. Allergies and medications reviewed and updated.  Review of Systems  Constitutional:  Positive for fatigue. Negative for activity change, appetite change, chills, diaphoresis, fever and unexpected weight change.  Respiratory: Negative.    Cardiovascular: Negative.   Musculoskeletal: Negative.   Neurological:  Positive for numbness. Negative for dizziness, tremors, seizures, syncope, facial asymmetry, speech difficulty, weakness, light-headedness and headaches.  Psychiatric/Behavioral:  Positive for sleep disturbance. Negative for agitation, behavioral problems, confusion, decreased concentration, dysphoric mood, hallucinations, self-injury and suicidal ideas. The patient is not nervous/anxious and is not hyperactive.     Per HPI unless specifically indicated above     Objective:    BP 123/74 (BP Location: Left Arm, Patient Position: Sitting, Cuff Size: Normal)   Pulse 80   Temp 98 F (36.7 C) (Oral)   Ht 5' 5 (1.651 m)   Wt 175 lb 9.6 oz (79.7 kg)   LMP  (LMP Unknown)   SpO2 98%   BMI 29.22 kg/m   Wt Readings from Last 3 Encounters:  02/16/24 175 lb 9.6 oz (79.7 kg)  01/25/24 176 lb 12.8 oz (80.2 kg)  01/04/24 163 lb (73.9 kg)    Physical Exam Vitals and nursing note reviewed.  Constitutional:      General: She is not in acute  distress.    Appearance: Normal appearance. She is not ill-appearing, toxic-appearing or diaphoretic.  HENT:     Head: Normocephalic and atraumatic.     Right Ear: External ear normal.     Left Ear: External ear normal.     Nose: Nose normal.     Mouth/Throat:      Mouth: Mucous membranes are moist.     Pharynx: Oropharynx is clear.  Eyes:     General: No scleral icterus.       Right eye: No discharge.        Left eye: No discharge.     Extraocular Movements: Extraocular movements intact.     Conjunctiva/sclera: Conjunctivae normal.     Pupils: Pupils are equal, round, and reactive to light.  Cardiovascular:     Rate and Rhythm: Normal rate and regular rhythm.     Pulses: Normal pulses.     Heart sounds: Murmur heard.     No friction rub. No gallop.  Pulmonary:     Effort: Pulmonary effort is normal. No respiratory distress.     Breath sounds: Normal breath sounds. No stridor. No wheezing, rhonchi or rales.  Chest:     Chest wall: No tenderness.  Musculoskeletal:        General: Normal range of motion.     Cervical back: Normal range of motion and neck supple.  Skin:    General: Skin is warm and dry.     Capillary Refill: Capillary refill takes less than 2 seconds.     Coloration: Skin is not jaundiced or pale.     Findings: No bruising, erythema, lesion or rash.  Neurological:     General: No focal deficit present.     Mental Status: She is alert and oriented to person, place, and time. Mental status is at baseline.  Psychiatric:        Mood and Affect: Mood normal.        Behavior: Behavior normal.        Thought Content: Thought content normal.        Judgment: Judgment normal.     Results for orders placed or performed in visit on 02/16/24  Bayer DCA Hb A1c Waived   Collection Time: 02/16/24 11:39 AM  Result Value Ref Range   HB A1C (BAYER DCA - WAIVED) 5.6 4.8 - 5.6 %  B Nat Peptide   Collection Time: 02/16/24 11:40 AM  Result Value Ref Range   BNP 35.5 0.0 - 100.0 pg/mL  Uric acid   Collection Time: 02/16/24 11:40 AM  Result Value Ref Range   Uric Acid 6.7 3.1 - 7.9 mg/dL  VITAMIN D  25 Hydroxy (Vit-D Deficiency, Fractures)   Collection Time: 02/16/24 11:40 AM  Result Value Ref Range   Vit D, 25-Hydroxy 25.8 (L) 30.0 -  100.0 ng/mL  Comprehensive metabolic panel with GFR   Collection Time: 02/16/24 11:40 AM  Result Value Ref Range   Glucose 109 (H) 70 - 99 mg/dL   BUN 9 8 - 27 mg/dL   Creatinine, Ser 9.23 0.57 - 1.00 mg/dL   eGFR 81 >40 fO/fpw/8.26   BUN/Creatinine Ratio 12 12 - 28   Sodium 138 134 - 144 mmol/L   Potassium 4.2 3.5 - 5.2 mmol/L   Chloride 102 96 - 106 mmol/L   CO2 24 20 - 29 mmol/L   Calcium  9.7 8.7 - 10.3 mg/dL   Total Protein 6.7 6.0 - 8.5 g/dL   Albumin 4.0 3.8 - 4.8  g/dL   Globulin, Total 2.7 1.5 - 4.5 g/dL   Bilirubin Total 0.6 0.0 - 1.2 mg/dL   Alkaline Phosphatase 82 49 - 135 IU/L   AST 22 0 - 40 IU/L   ALT 19 0 - 32 IU/L  CBC with Differential/Platelet   Collection Time: 02/16/24 11:40 AM  Result Value Ref Range   WBC 4.0 3.4 - 10.8 x10E3/uL   RBC 3.88 3.77 - 5.28 x10E6/uL   Hemoglobin 13.4 11.1 - 15.9 g/dL   Hematocrit 59.7 65.9 - 46.6 %   MCV 104 (H) 79 - 97 fL   MCH 34.5 (H) 26.6 - 33.0 pg   MCHC 33.3 31.5 - 35.7 g/dL   RDW 87.6 88.2 - 84.5 %   Platelets 141 (L) 150 - 450 x10E3/uL   Neutrophils 40 Not Estab. %   Lymphs 42 Not Estab. %   Monocytes 14 Not Estab. %   Eos 3 Not Estab. %   Basos 1 Not Estab. %   Neutrophils Absolute 1.6 1.4 - 7.0 x10E3/uL   Lymphocytes Absolute 1.7 0.7 - 3.1 x10E3/uL   Monocytes Absolute 0.6 0.1 - 0.9 x10E3/uL   EOS (ABSOLUTE) 0.1 0.0 - 0.4 x10E3/uL   Basophils Absolute 0.0 0.0 - 0.2 x10E3/uL   Immature Granulocytes 0 Not Estab. %   Immature Grans (Abs) 0.0 0.0 - 0.1 x10E3/uL  Lipid Panel w/o Chol/HDL Ratio   Collection Time: 02/16/24 11:40 AM  Result Value Ref Range   Cholesterol, Total 183 100 - 199 mg/dL   Triglycerides 899 0 - 149 mg/dL   HDL 46 >60 mg/dL   VLDL Cholesterol Cal 18 5 - 40 mg/dL   LDL Chol Calc (NIH) 880 (H) 0 - 99 mg/dL  A87   Collection Time: 02/16/24 11:40 AM  Result Value Ref Range   Vitamin B-12 346 232 - 1,245 pg/mL      Assessment & Plan:   Problem List Items Addressed This Visit        Cardiovascular and Mediastinum   Essential hypertension   Under good control on current regimen. Continue current regimen. Continue to monitor. Call with any concerns. Refills given. Labs drawn today.        Relevant Medications   losartan  (COZAAR ) 50 MG tablet   spironolactone  (ALDACTONE ) 25 MG tablet   Other Relevant Orders   Comprehensive metabolic panel with GFR (Completed)   CBC with Differential/Platelet (Completed)   Senile purpura   Reassured patient. Continue to monitor.       Relevant Medications   losartan  (COZAAR ) 50 MG tablet   spironolactone  (ALDACTONE ) 25 MG tablet   Other Relevant Orders   Comprehensive metabolic panel with GFR (Completed)   CBC with Differential/Platelet (Completed)   Varicose veins of right lower extremity   Encouraged patient to use her compression stockings. Follow up with vascular. Call with any concerns.       Relevant Medications   losartan  (COZAAR ) 50 MG tablet   spironolactone  (ALDACTONE ) 25 MG tablet     Endocrine   IFG (impaired fasting glucose)   Stable with A1c of 5.6. Continue diet and exercise. Call with any concerns. Continue to monitor.       Relevant Orders   Comprehensive metabolic panel with GFR (Completed)   CBC with Differential/Platelet (Completed)   Bayer DCA Hb A1c Waived (Completed)     Musculoskeletal and Integument   Statin myopathy   Cannot tolerate statins. Rechecking labs today. Await results.       Relevant  Orders   Comprehensive metabolic panel with GFR (Completed)   CBC with Differential/Platelet (Completed)     Other   Pure hypercholesterolemia   Under good control on current regimen. Continue current regimen. Continue to monitor. Call with any concerns. Refills given. Labs drawn today.       Relevant Medications   losartan  (COZAAR ) 50 MG tablet   spironolactone  (ALDACTONE ) 25 MG tablet   Other Relevant Orders   Comprehensive metabolic panel with GFR (Completed)   CBC with  Differential/Platelet (Completed)   Lipid Panel w/o Chol/HDL Ratio (Completed)   B12 deficiency   Rechecking labs today. Await results. Treat as needed.       Relevant Orders   Comprehensive metabolic panel with GFR (Completed)   CBC with Differential/Platelet (Completed)   B12 (Completed)   Vitamin D  deficiency   Rechecking labs today. Await results. Treat as needed.       Relevant Orders   VITAMIN D  25 Hydroxy (Vit-D Deficiency, Fractures) (Completed)   Comprehensive metabolic panel with GFR (Completed)   CBC with Differential/Platelet (Completed)   Gout   Rechecking labs today. Await results. Treat as needed.       Relevant Orders   Uric acid (Completed)   Comprehensive metabolic panel with GFR (Completed)   CBC with Differential/Platelet (Completed)   Overweight (BMI 25.0-29.9)   Patient has Rx for zepbound  at [facility] direct but has not filled it. Rx resent. Call with any concerns.       Relevant Medications   tirzepatide  (ZEPBOUND ) 2.5 MG/0.5ML injection vial   Other Visit Diagnoses       Peripheral edema    -  Primary   Likely vascular insufficency. Encouraged compression stockings. Will check labs. Call with any concerns.   Relevant Orders   B Nat Peptide (Completed)   Comprehensive metabolic panel with GFR (Completed)   CBC with Differential/Platelet (Completed)        Follow up plan: Return in about 4 weeks (around 03/15/2024).

## 2024-02-16 NOTE — Patient Instructions (Signed)
 Please call to schedule your mammogram and/or bone density: [facility] at [facility]  Address: [address], [place], [place] [address] Phone: [phone]  [facility] [address] [place],  [place]  [address] Phone: [phone]

## 2024-02-17 LAB — COMPREHENSIVE METABOLIC PANEL WITH GFR
ALT: 19 IU/L (ref 0–32)
AST: 22 IU/L (ref 0–40)
Albumin: 4 g/dL (ref 3.8–4.8)
Alkaline Phosphatase: 82 IU/L (ref 49–135)
BUN/Creatinine Ratio: 12 (ref 12–28)
BUN: 9 mg/dL (ref 8–27)
Bilirubin Total: 0.6 mg/dL (ref 0.0–1.2)
CO2: 24 mmol/L (ref 20–29)
Calcium: 9.7 mg/dL (ref 8.7–10.3)
Chloride: 102 mmol/L (ref 96–106)
Creatinine, Ser: 0.76 mg/dL (ref 0.57–1.00)
Globulin, Total: 2.7 g/dL (ref 1.5–4.5)
Glucose: 109 mg/dL — ABNORMAL HIGH (ref 70–99)
Potassium: 4.2 mmol/L (ref 3.5–5.2)
Sodium: 138 mmol/L (ref 134–144)
Total Protein: 6.7 g/dL (ref 6.0–8.5)
eGFR: 81 mL/min/1.73 (ref >59)

## 2024-02-17 LAB — CBC WITH DIFFERENTIAL/PLATELET
Basophils Absolute: 0 x10E3/uL (ref 0.0–0.2)
Basos: 1 %
EOS (ABSOLUTE): 0.1 x10E3/uL (ref 0.0–0.4)
Eos: 3 %
Hematocrit: 40.2 % (ref 34.0–46.6)
Hemoglobin: 13.4 g/dL (ref 11.1–15.9)
Immature Grans (Abs): 0 x10E3/uL (ref 0.0–0.1)
Immature Granulocytes: 0 %
Lymphocytes Absolute: 1.7 x10E3/uL (ref 0.7–3.1)
Lymphs: 42 %
MCH: 34.5 pg — ABNORMAL HIGH (ref 26.6–33.0)
MCHC: 33.3 g/dL (ref 31.5–35.7)
MCV: 104 fL — ABNORMAL HIGH (ref 79–97)
Monocytes Absolute: 0.6 x10E3/uL (ref 0.1–0.9)
Monocytes: 14 %
Neutrophils Absolute: 1.6 x10E3/uL (ref 1.4–7.0)
Neutrophils: 40 %
Platelets: 141 x10E3/uL — ABNORMAL LOW (ref 150–450)
RBC: 3.88 x10E6/uL (ref 3.77–5.28)
RDW: 12.3 % (ref 11.7–15.4)
WBC: 4 x10E3/uL (ref 3.4–10.8)

## 2024-02-17 LAB — LIPID PANEL W/O CHOL/HDL RATIO
Cholesterol, Total: 183 mg/dL (ref 100–199)
HDL: 46 mg/dL (ref >39)
LDL Chol Calc (NIH): 119 mg/dL — ABNORMAL HIGH (ref 0–99)
Triglycerides: 100 mg/dL (ref 0–149)
VLDL Cholesterol Cal: 18 mg/dL (ref 5–40)

## 2024-02-17 LAB — URIC ACID: Uric Acid: 6.7 mg/dL (ref 3.1–7.9)

## 2024-02-17 LAB — VITAMIN B12: Vitamin B-12: 346 pg/mL (ref 232–1245)

## 2024-02-17 LAB — VITAMIN D 25 HYDROXY (VIT D DEFICIENCY, FRACTURES): Vit D, 25-Hydroxy: 25.8 ng/mL — ABNORMAL LOW (ref 30.0–100.0)

## 2024-02-17 LAB — BRAIN NATRIURETIC PEPTIDE: BNP: 35.5 pg/mL (ref 0.0–100.0)

## 2024-02-21 ENCOUNTER — Encounter: Payer: Self-pay | Admitting: Family Medicine

## 2024-02-21 ENCOUNTER — Ambulatory Visit: Payer: Self-pay | Admitting: Family Medicine

## 2024-02-21 ENCOUNTER — Ambulatory Visit: Admitting: Dermatology

## 2024-02-21 DIAGNOSIS — Z85828 Personal history of other malignant neoplasm of skin: Secondary | ICD-10-CM

## 2024-02-21 DIAGNOSIS — D225 Melanocytic nevi of trunk: Secondary | ICD-10-CM | POA: Diagnosis not present

## 2024-02-21 DIAGNOSIS — Z7189 Other specified counseling: Secondary | ICD-10-CM

## 2024-02-21 DIAGNOSIS — Z1283 Encounter for screening for malignant neoplasm of skin: Secondary | ICD-10-CM | POA: Diagnosis not present

## 2024-02-21 DIAGNOSIS — L814 Other melanin hyperpigmentation: Secondary | ICD-10-CM

## 2024-02-21 DIAGNOSIS — D239 Other benign neoplasm of skin, unspecified: Secondary | ICD-10-CM

## 2024-02-21 DIAGNOSIS — L821 Other seborrheic keratosis: Secondary | ICD-10-CM

## 2024-02-21 DIAGNOSIS — D489 Neoplasm of uncertain behavior, unspecified: Secondary | ICD-10-CM | POA: Diagnosis not present

## 2024-02-21 DIAGNOSIS — Z79899 Other long term (current) drug therapy: Secondary | ICD-10-CM

## 2024-02-21 DIAGNOSIS — W908XXA Exposure to other nonionizing radiation, initial encounter: Secondary | ICD-10-CM | POA: Diagnosis not present

## 2024-02-21 DIAGNOSIS — D229 Melanocytic nevi, unspecified: Secondary | ICD-10-CM

## 2024-02-21 DIAGNOSIS — L820 Inflamed seborrheic keratosis: Secondary | ICD-10-CM

## 2024-02-21 DIAGNOSIS — L578 Other skin changes due to chronic exposure to nonionizing radiation: Secondary | ICD-10-CM

## 2024-02-21 DIAGNOSIS — L920 Granuloma annulare: Secondary | ICD-10-CM

## 2024-02-21 HISTORY — DX: Other benign neoplasm of skin, unspecified: D23.9

## 2024-02-21 MED ORDER — DAPSONE 7.5 % EX GEL: CUTANEOUS | 11 refills | Status: AC

## 2024-02-21 NOTE — Assessment & Plan Note (Signed)
 Patient has Rx for zepbound  at [facility] direct but has not filled it. Rx resent. Call with any concerns.

## 2024-02-21 NOTE — Assessment & Plan Note (Signed)
 Cannot tolerate statins. Rechecking labs today. Await results.

## 2024-02-21 NOTE — Assessment & Plan Note (Signed)
 Rechecking labs today. Await results. Treat as needed.

## 2024-02-21 NOTE — Assessment & Plan Note (Signed)
 Under good control on current regimen. Continue current regimen. Continue to monitor. Call with any concerns. Refills given. Labs drawn today.

## 2024-02-21 NOTE — Assessment & Plan Note (Signed)
 Reassured patient. Continue to monitor.

## 2024-02-21 NOTE — Assessment & Plan Note (Signed)
 Encouraged patient to use her compression stockings. Follow up with vascular. Call with any concerns.

## 2024-02-21 NOTE — Assessment & Plan Note (Signed)
Stable with A1c of 5.6. Continue diet and exercise. Call with any concerns. Continue to monitor.  

## 2024-02-21 NOTE — Progress Notes (Unsigned)
 Follow-Up Visit   Subjective  Tamara Fleming is a 78 y.o. female who presents for the following: Skin Cancer Screening and Full Body Skin Exam Hx of bcc Hx of isks Hx of aks Hx of granuloma annulare at b/l legs Using dapsone  7.5 % gel  Hx of atopic derm with fissure at b/l feet   Left leg Right thigh Back of right arm  Spots at chest Spot at right chest  Spot at left forehead   The patient presents for Total-Body Skin Exam (TBSE) for skin cancer screening and mole check. The patient has spots, moles and lesions to be evaluated, some may be new or changing and the patient may have concern these could be cancer.  The following portions of the chart were reviewed this encounter and updated as appropriate: medications, allergies, medical history  Review of Systems:  No other skin or systemic complaints except as noted in HPI or Assessment and Plan.  Objective  Well appearing patient in no apparent distress; mood and affect are within normal limits.  A full examination was performed including scalp, head, eyes, ears, nose, lips, neck, chest, axillae, abdomen, back, buttocks, bilateral upper extremities, bilateral lower extremities, hands, feet, fingers, toes, fingernails, and toenails. All findings within normal limits unless otherwise noted below.   Relevant physical exam findings are noted in the Assessment and Plan.  Granuloma Annulare on b/l legs and thighs               left forehead x 1, right tricep x 1, right medial breast x 1, left chest x 1, right chest x 3, left thigh x 1, right thigh x 1 (9) Erythematous stuck-on, waxy papule or plaque right superior buttock 0.4 cm irregular brown macule    Assessment & Plan    HISTORY OF BASAL CELL CARCINOMA OF THE SKIN Left chin 10/30/19 - No evidence of recurrence today - Recommend regular full body skin exams - Recommend daily broad spectrum sunscreen SPF 30+ to sun-exposed areas, reapply every 2 hours as  needed.  - Call if any new or changing lesions are noted between office visits  SKIN CANCER SCREENING PERFORMED TODAY.  ACTINIC DAMAGE - Chronic condition, secondary to cumulative UV/sun exposure - diffuse scaly erythematous macules with underlying dyspigmentation - Recommend daily broad spectrum sunscreen SPF 30+ to sun-exposed areas, reapply every 2 hours as needed.  - Staying in the shade or wearing long sleeves, sun glasses (UVA+UVB protection) and wide brim hats (4-inch brim around the entire circumference of the hat) are also recommended for sun protection.  - Call for new or changing lesions.  LENTIGINES, SEBORRHEIC KERATOSES, HEMANGIOMAS - Benign normal skin lesions - Benign-appearing - Call for any changes  MELANOCYTIC NEVI - Tan-brown and/or pink-flesh-colored symmetric macules and papules - Benign appearing on exam today - Observation - Call clinic for new or changing moles - Recommend daily use of broad spectrum spf 30+ sunscreen to sun-exposed areas.   DERMATOFIBROMA Exam: Firm pink/brown papulenodule with dimple sign. Treatment Plan: A dermatofibroma is a benign growth possibly related to trauma, such as an insect bite, cut from shaving, or inflamed acne-type bump.  Treatment options to remove include shave or excision with resulting scar and risk of recurrence.  Since benign-appearing and not bothersome, will observe for now.   Granuloma annulare Exam :   red purple patches at b/l thighs , legs and right axilla see photos Chronic and persistent condition with duration or expected duration over one year. Condition  is symptomatic / bothersome to patient. Not to goal. Continue Dapsone  7.5% gel QD.   Benign-appearing.  Observation.  Call clinic for new or changing lesions.  Recommend daily use of broad spectrum spf 30+ sunscreen to sun-exposed areas.    Indentation at right cheek Hx of bx by Dr. Lum Gaskins  which was normal  No history of trauma  > than 30 years  ago Recommend no treatment Discussed face lift    INFLAMED SEBORRHEIC KERATOSIS (9) left forehead x 1, right tricep x 1, right medial breast x 1, left chest x 1, right chest x 3, left thigh x 1, right thigh x 1 (9) Symptomatic, irritating, patient would like treated. Destruction of lesion - left forehead x 1, right tricep x 1, right medial breast x 1, left chest x 1, right chest x 3, left thigh x 1, right thigh x 1 (9) Complexity: simple   Destruction method: cryotherapy   Informed consent: discussed and consent obtained   Timeout:  patient name, date of birth, surgical site, and procedure verified Lesion destroyed using liquid nitrogen: Yes   Region frozen until ice ball extended beyond lesion: Yes   Outcome: patient tolerated procedure well with no complications   Post-procedure details: wound care instructions given    NEOPLASM OF UNCERTAIN BEHAVIOR right superior buttock Epidermal / dermal shaving  Lesion diameter (cm):  0.4 Informed consent: discussed and consent obtained   Timeout: patient name, date of birth, surgical site, and procedure verified   Procedure prep:  Patient was prepped and draped in usual sterile fashion Prep type:  Isopropyl alcohol  Anesthesia: the lesion was anesthetized in a standard fashion   Anesthetic:  1% lidocaine  w/ epinephrine  1-100,000 buffered w/ 8.4% NaHCO3 Instrument used: flexible razor blade   Hemostasis achieved with: pressure, aluminum chloride and electrodesiccation   Outcome: patient tolerated procedure well   Post-procedure details: sterile dressing applied and wound care instructions given   Dressing type: bandage and petrolatum    Specimen 1 - Surgical pathology Differential Diagnosis: irregular nevus vs dysplasia vs seborrheic keratosis   Check Margins: yes R/o irregular nevus vs dyplasia vs seborrheic keratosis  GRANULOMA ANNULARE   Related Medications Dapsone  7.5 % GEL Apply to legs once a day Return in about 1 year (around  02/20/2025) for TBSE.  IEleanor Blush, CMA, am acting as scribe for Alm Rhyme, MD.   Documentation: I have reviewed the above documentation for accuracy and completeness, and I agree with the above.  Alm Rhyme, MD

## 2024-02-21 NOTE — Patient Instructions (Addendum)
 Biopsy Wound Care Instructions  Leave the original bandage on for 24 hours if possible.  If the bandage becomes soaked or soiled before that time, it is OK to remove it and examine the wound.  A small amount of post-operative bleeding is normal.  If excessive bleeding occurs, remove the bandage, place gauze over the site and apply continuous pressure (no peeking) over the area for 30 minutes. If this does not work, please call our clinic as soon as possible or page your doctor if it is after hours.   Once a day, cleanse the wound with soap and water. It is fine to shower. If a thick crust develops you may use a Q-tip dipped into dilute hydrogen peroxide (mix 1:1 with water) to dissolve it.  Hydrogen peroxide can slow the healing process, so use it only as needed.    After washing, apply petroleum jelly (Vaseline) or an antibiotic ointment if your doctor prescribed one for you, followed by a bandage.    For best healing, the wound should be covered with a layer of ointment at all times. If you are not able to keep the area covered with a bandage to hold the ointment in place, this may mean re-applying the ointment several times a day.  Continue this wound care until the wound has healed and is no longer open.   Itching and mild discomfort is normal during the healing process. However, if you develop pain or severe itching, please call our office.   If you have any discomfort, you can take Tylenol (acetaminophen) or ibuprofen as directed on the bottle. (Please do not take these if you have an allergy to them or cannot take them for another reason).  Some redness, tenderness and white or yellow material in the wound is normal healing.  If the area becomes very sore and red, or develops a thick yellow-green material (pus), it may be infected; please notify us .    If you have stitches, return to clinic as directed to have the stitches removed. You will continue wound care for 2-3 days after the stitches  are removed.   Wound healing continues for up to one year following surgery. It is not unusual to experience pain in the scar from time to time during the interval.  If the pain becomes severe or the scar thickens, you should notify the office.    A slight amount of redness in a scar is expected for the first six months.  After six months, the redness will fade and the scar will soften and fade.  The color difference becomes less noticeable with time.  If there are any problems, return for a post-op surgery check at your earliest convenience.  To improve the appearance of the scar, you can use silicone scar gel, cream, or sheets (such as Mederma or Serica) every night for up to one year. These are available over the counter (without a prescription).  Please call our office at [phone] for any questions or concerns.  Seborrheic Keratosis  What causes seborrheic keratoses? Seborrheic keratoses are harmless, common skin growths that first appear during adult life.  As time goes by, more growths appear.  Some people may develop a large number of them.  Seborrheic keratoses appear on both covered and uncovered body parts.  They are not caused by sunlight.  The tendency to develop seborrheic keratoses can be inherited.  They vary in color from skin-colored to gray, brown, or even black.  They can be either  smooth or have a rough, warty surface.   Seborrheic keratoses are superficial and look as if they were stuck on the skin.  Under the microscope this type of keratosis looks like layers upon layers of skin.  That is why at times the top layer may seem to fall off, but the rest of the growth remains and re-grows.    Treatment Seborrheic keratoses do not need to be treated, but can easily be removed in the office.  Seborrheic keratoses often cause symptoms when they rub on clothing or jewelry.  Lesions can be in the way of shaving.  If they become inflamed, they can cause itching, soreness, or  burning.  Removal of a seborrheic keratosis can be accomplished by freezing, burning, or surgery. If any spot bleeds, scabs, or grows rapidly, please return to have it checked, as these can be an indication of a skin cancer.   Cryotherapy Aftercare  Wash gently with soap and water everyday.   Apply Vaseline and Band-Aid daily until healed.     Melanoma ABCDEs  Melanoma is the most dangerous type of skin cancer, and is the leading cause of death from skin disease.  You are more likely to develop melanoma if you: Have light-colored skin, light-colored eyes, or red or blond hair Spend a lot of time in the sun Tan regularly, either outdoors or in a tanning bed Have had blistering sunburns, especially during childhood Have a close family member who has had a melanoma Have atypical moles or large birthmarks  Early detection of melanoma is key since treatment is typically straightforward and cure rates are extremely high if we catch it early.   The first sign of melanoma is often a change in a mole or a new dark spot.  The ABCDE system is a way of remembering the signs of melanoma.  A for asymmetry:  The two halves do not match. B for border:  The edges of the growth are irregular. C for color:  A mixture of colors are present instead of an even brown color. D for diameter:  Melanomas are usually (but not always) greater than 6mm - the size of a pencil eraser. E for evolution:  The spot keeps changing in size, shape, and color.  Please check your skin once per month between visits. You can use a small mirror in front and a large mirror behind you to keep an eye on the back side or your body.   If you see any new or changing lesions before your next follow-up, please call to schedule a visit.  Please continue daily skin protection including broad spectrum sunscreen SPF 30+ to sun-exposed areas, reapplying every 2 hours as needed when you're outdoors.   Staying in the shade or wearing  long sleeves, sun glasses (UVA+UVB protection) and wide brim hats (4-inch brim around the entire circumference of the hat) are also recommended for sun protection.    Due to recent changes in healthcare laws, you may see results of your pathology and/or laboratory studies on MyChart before the doctors have had a chance to review them. We understand that in some cases there may be results that are confusing or concerning to you. Please understand that not all results are received at the same time and often the doctors may need to interpret multiple results in order to provide you with the best plan of care or course of treatment. Therefore, we ask that you please give us  2 business days to thoroughly review  all your results before contacting the office for clarification. Should we see a critical lab result, you will be contacted sooner.   If You Need Anything After Your Visit  If you have any questions or concerns for your doctor, please call our main line at [phone] and press option 4 to reach your doctor's medical assistant. If no one answers, please leave a voicemail as directed and we will return your call as soon as possible. Messages left after 4 pm will be answered the following business day.   You may also send us  a message via MyChart. We typically respond to MyChart messages within 1-2 business days.  For prescription refills, please ask your pharmacy to contact our office. Our fax number is [phone].  If you have an urgent issue when the clinic is closed that cannot wait until the next business day, you can page your doctor at the number below.    Please note that while we do our best to be available for urgent issues outside of office hours, we are not available 24/7.   If you have an urgent issue and are unable to reach us , you may choose to seek medical care at your doctor's office, retail clinic, urgent care center, or emergency room.  If you have a medical emergency, please  immediately call 911 or go to the emergency department.  Pager Numbers  - Dr. Hester: [phone]  - Dr. Jackquline: [phone]  - Dr. Claudene: [phone]   - Dr. Raymund: [phone]  In the event of inclement weather, please call our main line at [phone] for an update on the status of any delays or closures.  Dermatology Medication Tips: Please keep the boxes that topical medications come in in order to help keep track of the instructions about where and how to use these. Pharmacies typically print the medication instructions only on the boxes and not directly on the medication tubes.   If your medication is too expensive, please contact our office at [phone] option 4 or send us  a message through MyChart.   We are unable to tell what your co-pay for medications will be in advance as this is different depending on your insurance coverage. However, we may be able to find a substitute medication at lower cost or fill out paperwork to get insurance to cover a needed medication.   If a prior authorization is required to get your medication covered by your insurance company, please allow us  1-2 business days to complete this process.  Drug prices often vary depending on where the prescription is filled and some pharmacies may offer cheaper prices.  The website www.goodrx.com contains coupons for medications through different pharmacies. The prices here do not account for what the cost may be with help from insurance (it may be cheaper with your insurance), but the website can give you the price if you did not use any insurance.  - You can print the associated coupon and take it with your prescription to the pharmacy.  - You may also stop by our office during regular business hours and pick up a GoodRx coupon card.  - If you need your prescription sent electronically to a different pharmacy, notify our office through [facility] or by phone at [phone] option  4.     Si Usted Necesita Algo Despus de Su Visita  Tambin puede enviarnos un mensaje a travs de Clinical cytogeneticist. Por lo general respondemos a los mensajes de MyChart en el transcurso de 1 a 2  das hbiles.  Para renovar recetas, por favor pida a su farmacia que se ponga en contacto con nuestra oficina. Randi lakes de fax es [place] [phone].  Si tiene un asunto urgente cuando la clnica est cerrada y que no puede esperar hasta el siguiente da hbil, puede llamar/localizar a su doctor(a) al nmero que aparece a continuacin.   Por favor, tenga en cuenta que aunque hacemos todo lo posible para estar disponibles para asuntos urgentes fuera del horario de [place], no estamos disponibles las 24 horas del da, los 7 [address]  [address] de la [place].   Si tiene un problema urgente y no puede comunicarse con nosotros, puede optar por buscar atencin mdica  en el consultorio de su doctor(a), en una clnica privada, en un centro de atencin urgente o en una sala de emergencias.  Si tiene Engineer, drilling, por favor llame inmediatamente al 911 o vaya a la sala de emergencias.  Nmeros de bper  - Dr. Hester: [phone]  - Dra. Jackquline: [phone]  - Dr. Claudene: [phone]  - Dra. Kitts: [phone]  En caso de inclemencias del [employer], por favor llame a nuestra lnea principal al [phone] para una actualizacin sobre el estado de cualquier retraso o cierre.  Consejos para la medicacin en dermatologa: Por favor, guarde las cajas en las que vienen los medicamentos de uso tpico para ayudarle a seguir las instrucciones sobre dnde y cmo usarlos. Las farmacias generalmente imprimen las instrucciones del medicamento slo en las cajas y no directamente en los tubos del [place].   Si su medicamento es muy caro, por favor, pngase en contacto con landry rieger llamando al [phone] y presione la opcin 4 o envenos un mensaje a travs de Clinical cytogeneticist.   No podemos decirle cul ser su copago  por los medicamentos por adelantado ya que esto es diferente dependiendo de la cobertura de su seguro. Sin embargo, es posible que podamos encontrar un medicamento sustituto a Audiological scientist un formulario para que el seguro cubra el medicamento que se considera necesario.   Si se requiere una autorizacin previa para que su compaa de seguros malta su medicamento, por favor permtanos de 1 a 2 das hbiles para completar este proceso.  Los precios de los medicamentos varan con frecuencia dependiendo del Environmental consultant de dnde se surte la receta y alguna farmacias pueden ofrecer precios ms baratos.  El sitio web www.goodrx.com tiene cupones para medicamentos de Health and safety inspector. Los precios aqu no tienen en cuenta lo que podra costar con la ayuda del seguro (puede ser ms barato con su seguro), pero el sitio web puede darle el precio si no utiliz Tourist information centre manager.  - Puede imprimir el cupn correspondiente y llevarlo con su receta a la farmacia.  - Tambin puede pasar por nuestra oficina durante el horario de atencin regular y Education officer, museum una tarjeta de cupones de GoodRx.  - Si necesita que su receta se enve electrnicamente a una farmacia diferente, informe a nuestra oficina a travs de MyChart de [place] o por telfono llamando al [phone] y presione la opcin 4.

## 2024-02-22 ENCOUNTER — Encounter: Payer: Self-pay | Admitting: Dermatology

## 2024-02-23 ENCOUNTER — Ambulatory Visit: Payer: Self-pay | Admitting: Dermatology

## 2024-02-23 ENCOUNTER — Encounter: Payer: Self-pay | Admitting: Dermatology

## 2024-02-23 LAB — SURGICAL PATHOLOGY

## 2024-02-23 NOTE — Telephone Encounter (Addendum)
 Called and discussed results. Verbalized understanding and denied further questions will recheck at next follow up.   ----- Message from Alm Rhyme sent at 02/23/2024  5:36 PM EST ----- FINAL DIAGNOSIS        1. Skin, right superior buttock :       DYSPLASTIC JUNCTIONAL NEVUS WITH MODERATE ATYPIA, LIMITED MARGINS FREE   Moderate Dysplastic Recheck next visit ----- Message ----- From: Interface, [facility] [place] [facility] Sent: 02/23/2024   4:29 PM EST To: Alm JAYSON Rhyme, MD

## 2024-02-24 ENCOUNTER — Ambulatory Visit: Payer: Medicare Other | Admitting: Dermatology

## 2024-03-16 ENCOUNTER — Encounter: Payer: Self-pay | Admitting: Family Medicine

## 2024-03-16 ENCOUNTER — Ambulatory Visit (INDEPENDENT_AMBULATORY_CARE_PROVIDER_SITE_OTHER): Admitting: Family Medicine

## 2024-03-16 DIAGNOSIS — E663 Overweight: Secondary | ICD-10-CM | POA: Diagnosis not present

## 2024-03-16 MED ORDER — TIRZEPATIDE-WEIGHT MANAGEMENT 2.5 MG/0.5ML ~~LOC~~ SOLN: 2.5000 mg | SUBCUTANEOUS | 0 refills | Status: AC

## 2024-03-16 MED ORDER — TIRZEPATIDE-WEIGHT MANAGEMENT 5 MG/0.5ML ~~LOC~~ SOLN: 5.0000 mg | SUBCUTANEOUS | 0 refills | Status: AC

## 2024-03-16 NOTE — Patient Instructions (Addendum)
 Learn more- Zepbound  is a prescription medicine. For more information, call [phone].

## 2024-03-16 NOTE — Progress Notes (Signed)
 BP 125/77 (BP Location: Left Arm, Patient Position: Sitting, Cuff Size: Normal)   Pulse 74   Temp 98.2 F (36.8 C) (Oral)   Resp 15   Ht 5' 5 (1.651 m)   Wt 173 lb 9.6 oz (78.7 kg)   LMP  (LMP Unknown)   SpO2 98%   BMI 28.89 kg/m    Subjective:    Patient ID: Tamara Fleming, female    DOB: 1946-03-23, 78 y.o.   MRN: [id number]  HPI: Tamara Fleming is a 78 y.o. female  Chief Complaint  Patient presents with   Follow-up    Says the swelling is fine one day and then worse again the next day. See's vascular again in January.    OVERWEIGHT Duration: chronic Previous attempts at weight loss: yes Complications of obesity: HTN, IFG, HLD Peak weight: 184lbs (current 173lbs) Weight loss goal: 25 lbs Weight loss to date: 11lbs Requesting obesity pharmacotherapy: yes Current weight loss supplements/medications: no Previous weight loss supplements/meds: yes- phentermine   Relevant past medical, surgical, family and social history reviewed and updated as indicated. Interim medical history since our last visit reviewed. Allergies and medications reviewed and updated.  Review of Systems  Constitutional: Negative.   Respiratory: Negative.    Cardiovascular: Negative.   Musculoskeletal: Negative.   Neurological: Negative.   Psychiatric/Behavioral: Negative.      Per HPI unless specifically indicated above     Objective:    BP 125/77 (BP Location: Left Arm, Patient Position: Sitting, Cuff Size: Normal)   Pulse 74   Temp 98.2 F (36.8 C) (Oral)   Resp 15   Ht 5' 5 (1.651 m)   Wt 173 lb 9.6 oz (78.7 kg)   LMP  (LMP Unknown)   SpO2 98%   BMI 28.89 kg/m   Wt Readings from Last 3 Encounters:  03/16/24 173 lb 9.6 oz (78.7 kg)  02/16/24 175 lb 9.6 oz (79.7 kg)  01/25/24 176 lb 12.8 oz (80.2 kg)    Physical Exam Vitals and nursing note reviewed.  Constitutional:      General: She is not in acute distress.    Appearance: Normal appearance. She is not  ill-appearing, toxic-appearing or diaphoretic.  HENT:     Head: Normocephalic and atraumatic.     Right Ear: External ear normal.     Left Ear: External ear normal.     Nose: Nose normal.     Mouth/Throat:     Mouth: Mucous membranes are moist.     Pharynx: Oropharynx is clear.  Eyes:     General: No scleral icterus.       Right eye: No discharge.        Left eye: No discharge.     Extraocular Movements: Extraocular movements intact.     Conjunctiva/sclera: Conjunctivae normal.     Pupils: Pupils are equal, round, and reactive to light.  Cardiovascular:     Rate and Rhythm: Normal rate and regular rhythm.     Pulses: Normal pulses.     Heart sounds: Normal heart sounds. No murmur heard.    No friction rub. No gallop.  Pulmonary:     Effort: Pulmonary effort is normal. No respiratory distress.     Breath sounds: Normal breath sounds. No stridor. No wheezing, rhonchi or rales.  Chest:     Chest wall: No tenderness.  Musculoskeletal:        General: Normal range of motion.     Cervical back: Normal range of  motion and neck supple.     Right lower leg: No edema.     Left lower leg: No edema.  Skin:    General: Skin is warm and dry.     Capillary Refill: Capillary refill takes less than 2 seconds.     Coloration: Skin is not jaundiced or pale.     Findings: No bruising, erythema, lesion or rash.  Neurological:     General: No focal deficit present.     Mental Status: She is alert and oriented to person, place, and time. Mental status is at baseline.  Psychiatric:        Mood and Affect: Mood normal.        Behavior: Behavior normal.        Thought Content: Thought content normal.        Judgment: Judgment normal.     Results for orders placed or performed in visit on 02/21/24  Surgical pathology   Collection Time: 02/21/24 12:00 AM  Result Value Ref Range   SURGICAL PATHOLOGY      SURGICAL PATHOLOGY [facility] [address] [place], [place] [address] Telephone [phone] or [phone] Fax [phone]  REPORT OF DERMATOPATHOLOGY   Accession #: [id number] Patient Name: Tamara, Fleming Visit # : [id number]  MRN: [id number] Cytotechnologist: Santa Md, Eleanor, Dermatopathologist, Electronic Signature DOB/Age Mar 05, 1946 (Age: 81) Gender: F Collected Date: 02/21/2024 Received Date: 02/22/2024  FINAL DIAGNOSIS       1. Skin, right superior buttock :       DYSPLASTIC JUNCTIONAL NEVUS WITH MODERATE ATYPIA, LIMITED MARGINS FREE       DATE SIGNED OUT: 02/23/2024 ELECTRONIC SIGNATURE : Munoz-Bishop Md, Melissa, Dermatopathologist, Electronic Signature  MICROSCOPIC DESCRIPTION 1. There is a proliferation of enlarged melanocytes, showing moderate atypia, arranged mostly as nests at the dermoepidermal junction.  There is slight dermal fibroplasia and bridging of rete ridges by nests.  These are th e findings of a dysplastic nevus with moderate atypia.  On limited margin evaluation, the edges appear free.  CASE COMMENTS STAINS USED IN DIAGNOSIS: H&E    CLINICAL HISTORY  SPECIMEN(S) OBTAINED 1. Skin, Right Superior Buttock  SPECIMEN COMMENTS: 1. 0.4 cm irregular brown macule, check margins SPECIMEN CLINICAL INFORMATION: 1. Neoplasm of uncertain behavior, irregular nevus vs dysplasia vs seborrheic keratosis    Gross Description 1. Formalin fixed specimen received:  7 X 5 X 1 MM, TOTO (3 P) (1 B) ( th ) margins inked.        Report signed out from the following location(s) [place] [address] [place], [place] [address] CLIA #: [id number]  [facility] [address] [place], [place] [address] CLIA #: [id number]       Assessment & Plan:   Problem List Items Addressed This Visit       Other   Overweight (BMI 25.0-29.9)   Has not gotten her zepbound  from [facility] yet- walked her through the process to get it. Recheck  in 6 weeks. Call with any concerns.       Relevant Medications   tirzepatide  (ZEPBOUND ) 2.5 MG/0.5ML injection vial     Follow up plan: Return for As scheduled.

## 2024-03-16 NOTE — Assessment & Plan Note (Signed)
 Has not gotten her zepbound  from [facility] yet- walked her through the process to get it. Recheck in 6 weeks. Call with any concerns.

## 2024-04-17 ENCOUNTER — Other Ambulatory Visit: Payer: Self-pay | Admitting: Family Medicine

## 2024-04-26 ENCOUNTER — Ambulatory Visit (INDEPENDENT_AMBULATORY_CARE_PROVIDER_SITE_OTHER): Admitting: Nurse Practitioner

## 2024-04-26 VITALS — BP 136/81 | HR 76 | Resp 17 | Ht 65.0 in | Wt 175.8 lb

## 2024-04-26 DIAGNOSIS — I83811 Varicose veins of right lower extremities with pain: Secondary | ICD-10-CM | POA: Diagnosis not present

## 2024-04-26 DIAGNOSIS — I10 Essential (primary) hypertension: Secondary | ICD-10-CM | POA: Diagnosis not present

## 2024-04-30 ENCOUNTER — Encounter (INDEPENDENT_AMBULATORY_CARE_PROVIDER_SITE_OTHER): Payer: Self-pay | Admitting: Nurse Practitioner

## 2024-04-30 NOTE — Progress Notes (Signed)
 "  Subjective:    Patient ID: Tamara Fleming, female    DOB: [birth year], 79 y.o.   MRN: [id number] Chief Complaint  Patient presents with   Follow-up    3 month no studies    HPI  Discussed the use of AI scribe software for clinical note transcription with the patient, who gave verbal consent to proceed.  History of Present Illness Tamara Fleming is a 79 year old female with chronic venous insufficiency and varicose veins of the right lower extremity who presents for evaluation of persistent right lower extremity pain, burning, and swelling despite conservative management.  She experiences persistent pain, burning, and swelling localized to the right lower extremity, most pronounced above the knee. The burning sensation is intermittent, with episodes of increased intensity, and is described as most severe over the anterior aspect of the knee. She notes progression of varicosities, now extending across the knee, and expresses concern regarding worsening appearance. Swelling has improved somewhat but remains cyclical, exacerbated by activity and heat.  A prior venous reflux study was performed on the right leg; the results showed findings involving the saphenous and accessory saphenous veins. She has not had a diagnosis of deep vein thrombosis in the past. Conservative management with compression stockings was attempted but discontinued due to significant discomfort, resulting in inability to tolerate consistent use.  She also has a varicose vein on the left lower extremity, which is currently asymptomatic without pain, swelling, or burning, though she is concerned about potential progression.  Additional symptoms include intermittent numbness in the toes of both feet, with episodes lasting up to half a day and most prominent in the fifth and fourth toes bilaterally. She describes a sensation of walking in cold water when barefoot on wood floors. She suspects possible neuropathy but  has not previously sought evaluation. She also reports persistent numbness and loss of grip in the fingers following cervical spine fusion in August 2025, resulting in functional limitations with daily activities.    Results Diagnostic Lower extremity venous duplex ultrasound: No deep vein thrombosis; reflux in saphenous vein and accessory saphenous vein of right leg   Review of Systems  Cardiovascular:  Positive for leg swelling.  Neurological:  Positive for numbness.  All other systems reviewed and are negative.      Objective:   Physical Exam Vitals reviewed.  HENT:     Head: Normocephalic.  Cardiovascular:     Rate and Rhythm: Normal rate.     Pulses: Normal pulses.  Pulmonary:     Effort: Pulmonary effort is normal.  Musculoskeletal:        General: Tenderness present.  Skin:    General: Skin is warm and dry.  Neurological:     Mental Status: She is alert and oriented to person, place, and time.  Psychiatric:        Mood and Affect: Mood normal.        Behavior: Behavior normal.        Thought Content: Thought content normal.        Judgment: Judgment normal.     Physical Exam    BP 136/81   Pulse 76   Resp 17   Ht 5' 5 (1.651 m)   Wt 175 lb 12.8 oz (79.7 kg)   LMP  (LMP Unknown)   BMI 29.25 kg/m   Past Medical History:  Diagnosis Date   Actinic keratosis    Anxiety    Arthritis of left hip 02/17/2017  Basal cell carcinoma 10/30/2019   L chin. Superficial.   Benign esophageal stricture    Carpal tunnel syndrome 05/09/2014   Right   Chest pain    Chest pain with high risk for cardiac etiology 10/20/2010   Chronic low back pain    Complication of anesthesia    small mouth   Diverticulitis 02/17/2017   Dysplastic nevus 02/21/2024   right superior buttock - moderate - will recheck at next followup   Fatigue    GERD (gastroesophageal reflux disease)    occ   Gout    Heart murmur    Hiatal hernia    Hip pain    Left   History of heart  attack    Hyperlipidemia    Hypertension    Low back pain (secondary) bilateral (right greater than left) 09/30/2016   OA (osteoarthritis)    Peripheral vascular disease    plhebitis 20 yrs ago   Piriformis syndrome of right side 02/17/2017   Primary osteoarthritis of first carpometacarpal joint of left hand 09/10/2016   Primary osteoarthritis of left hip 03/23/2017    Social History   Socioeconomic History   Marital status: Married    Spouse name: Vaughn   Number of children: 1   Years of education: 13, 1 year college    Highest education level: Some college, no degree  Occupational History   Occupation: self employed   Tobacco Use   Smoking status: Never   Smokeless tobacco: Never  Vaping Use   Vaping status: Never Used  Substance and Sexual Activity   Alcohol  use: Yes    Alcohol /week: 7.0 standard drinks of alcohol     Types: 7 Glasses of wine per week    Comment: 1 glass of wine daily   Drug use: Yes    Comment: prescribed hydrocodone  by pain mgmt   Sexual activity: Not Currently    Birth control/protection: None  Other Topics Concern   Not on file  Social History Narrative   Self employed, 1 biological child and 2 step-children   Social Drivers of Health   Tobacco Use: Low Risk (04/30/2024)   Patient History    Smoking Tobacco Use: Never    Smokeless Tobacco Use: Never    Passive Exposure: Not on file  Financial Resource Strain: Low Risk  (08/31/2023)   Received from [facility]   Overall Financial Resource Strain (CARDIA)    Difficulty of Paying Living Expenses: Not hard at all  Food Insecurity: No Food Insecurity (08/31/2023)   Received from [facility]   Epic    Within the past 12 months, you worried that your food would run out before you got the money to buy more.: Never true    Within the past 12 months, the food you bought just didn't last and you didn't have money to get more.: Never true  Transportation Needs: No  Transportation Needs (08/31/2023)   Received from [facility] - Transportation    In the past 12 months, has lack of transportation kept you from medical appointments or from getting medications?: No    Lack of Transportation (Non-Medical): No  Physical Activity: Inactive (07/29/2023)   Exercise Vital Sign    Days of Exercise per Week: 0 days    Minutes of Exercise per Session: 0 min  Stress: Stress Concern Present (07/29/2023)   [employer] - Occupational Stress Questionnaire    Feeling of Stress : Rather  much  Social Connections: Unknown (07/29/2023)   Social Connection and Isolation Panel    Frequency of Communication with Friends and Family: More than three times a week    Frequency of Social Gatherings with Friends and Family: More than three times a week    Attends Religious Services: Patient declined    Database Administrator or Organizations: Yes    Attends Engineer, Structural: More than 4 times per year    Marital Status: Married  Catering Manager Violence: Not At Risk (07/29/2023)   Humiliation, Afraid, Rape, and Kick questionnaire    Fear of Current or Ex-Partner: No    Emotionally Abused: No    Physically Abused: No    Sexually Abused: No  Depression (PHQ2-9): Low Risk (03/16/2024)   Depression (PHQ2-9)    PHQ-2 Score: 0  Alcohol  Screen: Low Risk (07/29/2023)   Alcohol  Screen    Last Alcohol  Screening Score (AUDIT): 4  Housing: Low Risk  (08/31/2023)   Received from [facility]   Epic    In the last 12 months, was there a time when you were not able to pay the mortgage or rent on time?: No    In the past 12 months, how many times have you moved where you were living?: 0    At any time in the past 12 months, were you homeless or living in a shelter (including now)?: No  Utilities: Not At Risk (08/31/2023)   Received from [facility] Utilities    Threatened with loss  of utilities: No  Health Literacy: Adequate Health Literacy (07/29/2023)   B1300 Health Literacy    Frequency of need for help with medical instructions: Never    Past Surgical History:  Procedure Laterality Date   ABDOMINAL HYSTERECTOMY  1982   Partial   APPENDECTOMY     ARM SURGERY Left    OF THE ULNA NERVE   BREAST EXCISIONAL BIOPSY Right    CARDIOVASCULAR STRESS TEST  08/11/2006   NORMAL. EF 55-60%   CARDIOVASCULAR STRESS TEST  10/31/1998   EF 60% AT REST TO 75-80% POST EXERCISE   CARPAL TUNNEL RELEASE Right 2015   CATARACT EXTRACTION W/PHACO Right 01/20/2023   Procedure: CATARACT EXTRACTION PHACO AND INTRAOCULAR LENS PLACEMENT (IOC) RIGHT  CLAREON PANOPTIC TORIC 12.87 01:02.9;  Surgeon: Mittie Gaskin, MD;  Location: [facility];  Service: Ophthalmology;  Laterality: Right;   CATARACT EXTRACTION W/PHACO Left 02/03/2023   Procedure: CATARACT EXTRACTION PHACO AND INTRAOCULAR LENS PLACEMENT (IOC) LEFT  CLAREON PANOPTIC 10.30 00:59.4;  Surgeon: Mittie Gaskin, MD;  Location: [facility];  Service: Ophthalmology;  Laterality: Left;   ESOPHAGOGASTRODUODENOSCOPY  05/16/1990   ESOPHAGOGASTRODUODENOSCOPY (EGD) WITH PROPOFOL  N/A 02/04/2015   Procedure: ESOPHAGOGASTRODUODENOSCOPY (EGD) WITH PROPOFOL ;  Surgeon: Deward CINDERELLA Piedmont, MD;  Location: [facility];  Service: Gastroenterology;  Laterality: N/A;   JOINT REPLACEMENT     NECK SURGERY  11/24/2023   REDUCTION MAMMAPLASTY  06/11/2015   TOTAL HIP ARTHROPLASTY Left 04/13/2017   Procedure: TOTAL LEFT HIP ARTHROPLASTY ANTERIOR APPROACH;  Surgeon: Beverley Evalene BIRCH, MD;  Location: [facility];  Service: Orthopedics;  Laterality: Left;    Family History  Problem Relation Age of Onset   Coronary artery disease Mother    Cancer Father        Lung    Allergies[1]     Latest Ref Rng & Units 02/16/2024   11:40 AM 06/10/2023   11:18 AM 10/30/2022  11:57 AM  CBC  WBC 3.4 - 10.8 x10E3/uL 4.0  4.1  3.9   Hemoglobin 11.1 -  15.9 g/dL 86.5  85.1  86.5   Hematocrit 34.0 - 46.6 % 40.2  42.4  38.5   Platelets 150 - 450 x10E3/uL 141  141  137       CMP     Component Value Date/Time   NA 138 02/16/2024 1140   NA 139 09/05/2012 0441   K 4.2 02/16/2024 1140   K 3.9 09/05/2012 0441   CL 102 02/16/2024 1140   CL 105 09/05/2012 0441   CO2 24 02/16/2024 1140   CO2 29 09/05/2012 0441   GLUCOSE 109 (H) 02/16/2024 1140   GLUCOSE 142 (H) 10/03/2021 0734   GLUCOSE 108 (H) 09/05/2012 0441   BUN 9 02/16/2024 1140   BUN 6 (L) 09/05/2012 0441   CREATININE 0.76 02/16/2024 1140   CREATININE 0.74 09/05/2012 0441   CREATININE 0.75 02/26/2012 1621   CALCIUM  9.7 02/16/2024 1140   CALCIUM  9.0 09/05/2012 0441   PROT 6.7 02/16/2024 1140   PROT 7.8 09/01/2012 1451   ALBUMIN 4.0 02/16/2024 1140   ALBUMIN 4.0 09/01/2012 1451   AST 22 02/16/2024 1140   AST 25 09/01/2012 1451   ALT 19 02/16/2024 1140   ALT 35 09/01/2012 1451   ALKPHOS 82 02/16/2024 1140   ALKPHOS 88 09/01/2012 1451   BILITOT 0.6 02/16/2024 1140   BILITOT 1.1 (H) 09/01/2012 1451   GFR 69.22 06/29/2012 1110   EGFR 81 02/16/2024 1140   GFRNONAA 55 (L) 10/03/2021 0734   GFRNONAA >60 09/05/2012 0441     No results found.     Assessment & Plan:   1. Varicose veins of right lower extremity with pain (Primary) Varicose veins of right lower extremity with pain Chronic symptomatic varicose veins with pain, burning, and swelling, refractory to conservative management. Symptoms impair quality of life. Burning and heat likely due to venous stasis and inflammation; persistent symptoms post-intervention may require neuropathy evaluation. Intervention elective based on symptom severity and preference. - Discussed endovenous laser ablation of the great saphenous vein as primary intervention, including procedural details, expected recovery, and post-procedural course. - Reviewed risks of endovenous ablation: superficial phlebitis (may require anticoagulation), nerve  irritation or numbness (typically transient), hematoma, swelling, ecchymosis, and post-procedural discomfort. - Outlined staged approach: if symptoms or visible varicosities persist after ablation, consider foam sclerotherapy for the accessory saphenous vein. - Reviewed normal post-procedure findings (tenderness, erythema, palpable cord) and emphasized importance of follow-up duplex ultrasound to assess for complications. - Advised that intervention is elective and based on symptom severity and her preference; encouraged her to consider options and contact the office when ready to proceed. - Planned to submit for insurance authorization and schedule the procedure if/when she elects to proceed.  2. Essential hypertension Continue antihypertensive medications as already ordered, these medications have been reviewed and there are no changes at this time.   Assessment and Plan Assessment & Plan      Medications Ordered Prior to Encounter[2]  There are no Patient Instructions on file for this visit. No follow-ups on file.   Tamara Fleming E Courtlyn Aki, NP      [1]  Allergies Allergen Reactions   Ciprofloxacin Anaphylaxis    Facial, tongue and throat    Floxin [Ofloxacin] Anaphylaxis    Facial, tongue and throat    Lipitor [Atorvastatin ] Other (See Comments)    myalgias   Sulfa Antibiotics Diarrhea and Nausea And Vomiting  [2]  Current Outpatient Medications on File Prior to Visit  Medication Sig Dispense Refill   ALPRAZolam  (XANAX ) 0.5 MG tablet Take 30 minutes prior to flight (Patient taking differently: as needed for anxiety. Take 30 minutes prior to flight) 15 tablet 0   aspirin  EC 81 MG tablet Take 81 mg by mouth daily.     budesonide -formoterol  (SYMBICORT ) 160-4.5 MCG/ACT inhaler Inhale 2 puffs into the lungs 2 (two) times daily. (Patient taking differently: Inhale 2 puffs into the lungs as needed.) 30.6 g 1   Cyanocobalamin  (VITAMIN B-12 IJ) Inject as directed every 30 (thirty) days.      cyclobenzaprine  (FLEXERIL ) 10 MG tablet 1 tablet Orally three times a day prn muscle spasms; Duration: 30 days (Patient taking differently: 1 or 2 tablets daily)     Dapsone  7.5 % GEL Apply to legs once a day 60 g 11   HYDROcodone -acetaminophen  (NORCO/VICODIN) 5-325 MG tablet Take 1 tablet by mouth every 6 (six) hours as needed for moderate pain (pain score 4-6).     losartan  (COZAAR ) 50 MG tablet TAKE 1 TABLET BY MOUTH ONCE DAILY 30 tablet 0   omeprazole  (PRILOSEC  OTC) 20 MG tablet Take 2 tablets (40 mg total) by mouth daily. 180 tablet 1   spironolactone  (ALDACTONE ) 25 MG tablet Take 1 tablet (25 mg total) by mouth daily. 90 tablet 1   tirzepatide  (ZEPBOUND ) 2.5 MG/0.5ML injection vial Inject 2.5 mg into the skin once a week. 2 mL 0   tirzepatide  5 MG/0.5ML injection vial Inject 5 mg into the skin once a week. 2 mL 0   Current Facility-Administered Medications on File Prior to Visit  Medication Dose Route Frequency Provider Last Rate Last Admin   cyanocobalamin  (VITAMIN B12) injection 1,000 mcg  1,000 mcg Intramuscular Q30 days        "

## 2024-08-10 ENCOUNTER — Ambulatory Visit

## 2025-02-20 ENCOUNTER — Ambulatory Visit: Admitting: Dermatology
